# Patient Record
Sex: Male | Born: 1950 | Race: White | Hispanic: No | Marital: Single | Smoking: Never smoker
Health system: Southern US, Community
[De-identification: ages and names within clinical notes are randomized; demographics above are authoritative.]

## PROBLEM LIST (undated history)

## (undated) DIAGNOSIS — I10 Essential (primary) hypertension: Secondary | ICD-10-CM

## (undated) DIAGNOSIS — E559 Vitamin D deficiency, unspecified: Secondary | ICD-10-CM

## (undated) DIAGNOSIS — I712 Thoracic aortic aneurysm, without rupture, unspecified: Secondary | ICD-10-CM

## (undated) DIAGNOSIS — F0150 Vascular dementia without behavioral disturbance: Secondary | ICD-10-CM

## (undated) DIAGNOSIS — N289 Disorder of kidney and ureter, unspecified: Secondary | ICD-10-CM

## (undated) DIAGNOSIS — I519 Heart disease, unspecified: Secondary | ICD-10-CM

## (undated) DIAGNOSIS — R1310 Dysphagia, unspecified: Secondary | ICD-10-CM

## (undated) DIAGNOSIS — K8020 Calculus of gallbladder without cholecystitis without obstruction: Secondary | ICD-10-CM

## (undated) DIAGNOSIS — I509 Heart failure, unspecified: Secondary | ICD-10-CM

## (undated) DIAGNOSIS — F259 Schizoaffective disorder, unspecified: Secondary | ICD-10-CM

## (undated) DIAGNOSIS — I739 Peripheral vascular disease, unspecified: Secondary | ICD-10-CM

## (undated) DIAGNOSIS — M109 Gout, unspecified: Secondary | ICD-10-CM

## (undated) DIAGNOSIS — D649 Anemia, unspecified: Secondary | ICD-10-CM

## (undated) DIAGNOSIS — D6859 Other primary thrombophilia: Secondary | ICD-10-CM

## (undated) DIAGNOSIS — B356 Tinea cruris: Secondary | ICD-10-CM

## (undated) DIAGNOSIS — M810 Age-related osteoporosis without current pathological fracture: Secondary | ICD-10-CM

## (undated) DIAGNOSIS — K769 Liver disease, unspecified: Secondary | ICD-10-CM

## (undated) DIAGNOSIS — E663 Overweight: Secondary | ICD-10-CM

## (undated) DIAGNOSIS — G20 Parkinson's disease: Secondary | ICD-10-CM

## (undated) DIAGNOSIS — I4891 Unspecified atrial fibrillation: Secondary | ICD-10-CM

## (undated) DIAGNOSIS — R633 Feeding difficulties, unspecified: Secondary | ICD-10-CM

## (undated) DIAGNOSIS — Z9119 Patient's noncompliance with other medical treatment and regimen: Secondary | ICD-10-CM

## (undated) DIAGNOSIS — E785 Hyperlipidemia, unspecified: Secondary | ICD-10-CM

## (undated) DIAGNOSIS — R41841 Cognitive communication deficit: Secondary | ICD-10-CM

## (undated) DIAGNOSIS — G20A1 Parkinson's disease without dyskinesia, without mention of fluctuations: Secondary | ICD-10-CM

## (undated) DIAGNOSIS — E876 Hypokalemia: Secondary | ICD-10-CM

## (undated) HISTORY — DX: Calculus of gallbladder without cholecystitis without obstruction: K80.20

## (undated) HISTORY — PX: OTHER SURGICAL HISTORY: SHX169

---

## 1998-04-13 ENCOUNTER — Inpatient Hospital Stay (HOSPITAL_COMMUNITY): Admission: EM | Admit: 1998-04-13 | Discharge: 1998-04-18 | Payer: Self-pay | Admitting: Emergency Medicine

## 2001-11-09 ENCOUNTER — Emergency Department (HOSPITAL_COMMUNITY): Admission: EM | Admit: 2001-11-09 | Discharge: 2001-11-09 | Payer: Self-pay | Admitting: *Deleted

## 2004-01-07 ENCOUNTER — Emergency Department (HOSPITAL_COMMUNITY): Admission: EM | Admit: 2004-01-07 | Discharge: 2004-01-07 | Payer: Self-pay | Admitting: Emergency Medicine

## 2004-10-29 HISTORY — PX: COLONOSCOPY: SHX174

## 2004-11-17 ENCOUNTER — Encounter: Admission: RE | Admit: 2004-11-17 | Discharge: 2004-11-17 | Payer: Self-pay | Admitting: Internal Medicine

## 2004-11-27 ENCOUNTER — Encounter: Admission: RE | Admit: 2004-11-27 | Discharge: 2005-02-25 | Payer: Self-pay | Admitting: Internal Medicine

## 2005-01-30 ENCOUNTER — Ambulatory Visit (HOSPITAL_COMMUNITY): Admission: RE | Admit: 2005-01-30 | Discharge: 2005-01-30 | Payer: Self-pay | Admitting: Gastroenterology

## 2006-06-24 ENCOUNTER — Emergency Department (HOSPITAL_COMMUNITY): Admission: EM | Admit: 2006-06-24 | Discharge: 2006-06-24 | Payer: Self-pay | Admitting: Emergency Medicine

## 2010-09-15 ENCOUNTER — Emergency Department (HOSPITAL_COMMUNITY): Admission: EM | Admit: 2010-09-15 | Discharge: 2010-09-15 | Payer: Self-pay | Admitting: Emergency Medicine

## 2010-11-18 ENCOUNTER — Emergency Department (HOSPITAL_COMMUNITY)
Admission: EM | Admit: 2010-11-18 | Discharge: 2010-11-18 | Payer: Self-pay | Source: Home / Self Care | Admitting: Emergency Medicine

## 2011-01-09 LAB — RAPID URINE DRUG SCREEN, HOSP PERFORMED
Amphetamines: NOT DETECTED
Barbiturates: NOT DETECTED
Benzodiazepines: NOT DETECTED
Cocaine: NOT DETECTED
Opiates: NOT DETECTED
Tetrahydrocannabinol: NOT DETECTED

## 2011-01-09 LAB — CBC
Platelets: 216 10*3/uL (ref 150–400)
RBC: 5.19 MIL/uL (ref 4.22–5.81)
RDW: 13.5 % (ref 11.5–15.5)
WBC: 6.7 10*3/uL (ref 4.0–10.5)

## 2011-01-09 LAB — DIFFERENTIAL
Basophils Absolute: 0 10*3/uL (ref 0.0–0.1)
Lymphocytes Relative: 19 % (ref 12–46)
Neutro Abs: 4.8 10*3/uL (ref 1.7–7.7)
Neutrophils Relative %: 73 % (ref 43–77)

## 2011-01-09 LAB — BASIC METABOLIC PANEL
BUN: 25 mg/dL — ABNORMAL HIGH (ref 6–23)
Creatinine, Ser: 2.06 mg/dL — ABNORMAL HIGH (ref 0.4–1.5)
GFR calc Af Amer: 40 mL/min — ABNORMAL LOW (ref >60)
GFR calc non Af Amer: 33 mL/min — ABNORMAL LOW (ref >60)

## 2011-01-09 LAB — ETHANOL: Alcohol, Ethyl (B): <5 mg/dL (ref 0–10)

## 2011-03-16 NOTE — Op Note (Signed)
NAMELEIBY, PIGEON NO.:  [id number]   MEDICAL RECORD NO.:  [id number]          PATIENT TYPE:  AMB   LOCATION:  ENDO                         FACILITY:  [facility]   PHYSICIAN:  Danise Edge, M.D.   DATE OF BIRTH:  Jun 26, 1951   DATE OF PROCEDURE:  01/30/2005  DATE OF DISCHARGE:                                 OPERATIVE REPORT   PROCEDURE INDICATIONS:  Mr. Todd Page is a 60 year old male born December 05, 1950. Mr. Todd Page is scheduled to undergo his first screening colonoscopy with  polypectomy to prevent colon cancer.   ENDOSCOPIST:  Danise Edge, M.D.   PREMEDICATION:  Versed 7 mg, Demerol 70 mg.   PROCEDURE:  After obtaining informed consent, Mr. Todd Page was placed in the  left lateral decubitus position. I administered intravenous Demerol and  intravenous Versed to achieve conscious sedation for the procedure. The  patient's blood pressure, oxygen saturation and cardiac rhythm were  monitored throughout the procedure and documented in the medical record.   Anal inspection and digital rectal exam were normal. The Olympus adjustable  pediatric colonoscope was introduced into the rectum and advanced to the  cecum as identified by a normal-appearing appendiceal orifice and ileocecal  valve. Colonic preparation for the exam today was excellent.   Rectum normal. Retroflexed view of the distal rectum normal.  Sigmoid colon and descending colon normal.  Splenic flexure normal.  Transverse colon normal.  Hepatic flexure normal.  Ascending colon normal.  Cecum and ileum and ileocecal valve normal.   ASSESSMENT:  Normal screening proctocolonoscopy to the cecum.      MJ/MEDQ  D:  01/30/2005  T:  01/30/2005  Job:  [id number]   cc:   Georgann Housekeeper, MD  [address]  [place]  [place] [address]  Fax: [phone]

## 2012-03-28 ENCOUNTER — Other Ambulatory Visit: Payer: Self-pay

## 2012-03-28 ENCOUNTER — Emergency Department (HOSPITAL_COMMUNITY)
Admission: EM | Admit: 2012-03-28 | Discharge: 2012-03-28 | Disposition: A | Discharge status: Home / Self Care | Payer: Medicare Other | Attending: Emergency Medicine | Admitting: Emergency Medicine

## 2012-03-28 ENCOUNTER — Encounter (HOSPITAL_COMMUNITY): Payer: Self-pay | Admitting: *Deleted

## 2012-03-28 ENCOUNTER — Emergency Department (HOSPITAL_COMMUNITY): Payer: Medicare Other

## 2012-03-28 DIAGNOSIS — F410 Panic disorder [episodic paroxysmal anxiety] without agoraphobia: Secondary | ICD-10-CM | POA: Insufficient documentation

## 2012-03-28 DIAGNOSIS — I4891 Unspecified atrial fibrillation: Secondary | ICD-10-CM | POA: Insufficient documentation

## 2012-03-28 DIAGNOSIS — I10 Essential (primary) hypertension: Secondary | ICD-10-CM | POA: Insufficient documentation

## 2012-03-28 DIAGNOSIS — E876 Hypokalemia: Secondary | ICD-10-CM | POA: Insufficient documentation

## 2012-03-28 HISTORY — DX: Essential (primary) hypertension: I10

## 2012-03-28 LAB — CBC
HCT: 48.7 % (ref 39.0–52.0)
Hemoglobin: 16.8 g/dL (ref 13.0–17.0)
RBC: 5.89 MIL/uL — ABNORMAL HIGH (ref 4.22–5.81)
RDW: 13.4 % (ref 11.5–15.5)
WBC: 7.7 10*3/uL (ref 4.0–10.5)

## 2012-03-28 LAB — DIFFERENTIAL
Basophils Absolute: 0 10*3/uL (ref 0.0–0.1)
Lymphocytes Relative: 22 % (ref 12–46)
Lymphs Abs: 1.7 10*3/uL (ref 0.7–4.0)
Monocytes Absolute: 0.6 10*3/uL (ref 0.1–1.0)
Neutro Abs: 5.2 10*3/uL (ref 1.7–7.7)

## 2012-03-28 LAB — BASIC METABOLIC PANEL
CO2: 26 mEq/L (ref 19–32)
Chloride: 103 mEq/L (ref 96–112)
Creatinine, Ser: 1.4 mg/dL — ABNORMAL HIGH (ref 0.50–1.35)

## 2012-03-28 MED ORDER — DILTIAZEM HCL 30 MG PO TABS: 30.0000 mg | ORAL_TABLET | Freq: Four times a day (QID) | ORAL | Status: DC

## 2012-03-28 MED ORDER — DILTIAZEM HCL 25 MG/5ML IV SOLN
10.0000 mg | Freq: Once | INTRAVENOUS | Status: AC
  Administered 2012-03-28: 10 mg via INTRAVENOUS
  Filled 2012-03-28: qty 5

## 2012-03-28 MED ORDER — ASPIRIN 81 MG PO CHEW: 81.0000 mg | CHEWABLE_TABLET | Freq: Every day | ORAL | Status: DC

## 2012-03-28 MED ORDER — POTASSIUM CHLORIDE CRYS ER 20 MEQ PO TBCR
40.0000 meq | EXTENDED_RELEASE_TABLET | Freq: Once | ORAL | Status: AC
  Administered 2012-03-28: 40 meq via ORAL
  Filled 2012-03-28: qty 2

## 2012-03-28 MED ORDER — SODIUM CHLORIDE 0.9 % IV BOLUS (SEPSIS)
250.0000 mL | Freq: Once | INTRAVENOUS | Status: AC
  Administered 2012-03-28: 1000 mL via INTRAVENOUS

## 2012-03-28 MED ORDER — SODIUM CHLORIDE 0.9 % IV SOLN: INTRAVENOUS | Status: DC

## 2012-03-28 NOTE — Discharge Instructions (Signed)
Atrial Fibrillation Your caregiver has diagnosed you with atrial fibrillation (AFib). The heart normally beats very regularly; AFib is a type of irregular heartbeat. The heart rate may be faster or slower than normal. This can prevent your heart from pumping as well as it should. AFib can be constant (chronic) or intermittent (paroxysmal). CAUSES  Atrial fibrillation may be caused by:  Heart disease, including heart attack, coronary artery disease, heart failure, diseases of the heart valves, and others.   Blood clot in the lungs (pulmonary embolism).   Pneumonia or other infections.   Chronic lung disease.   Thyroid disease.   Toxins. These include alcohol, some medications (such as decongestant medications or diet pills), and caffeine.  In some people, no cause for AFib can be found. This is referred to as Lone Atrial Fibrillation. SYMPTOMS   Palpitations or a fluttering in your chest.   A vague sense of chest discomfort.   Shortness of breath.   Sudden onset of lightheadedness or weakness.  Sometimes, the first sign of AFib can be a complication of the condition. This could be a stroke or heart failure. DIAGNOSIS  Your description of your condition may make your caregiver suspicious of atrial fibrillation. Your caregiver will examine your pulse to determine if fibrillation is present. An EKG (electrocardiogram) will confirm the diagnosis. Further testing may help determine what caused you to have atrial fibrillation. This may include chest x-ray, echocardiogram, blood tests, or CT scans. PREVENTION  If you have previously had atrial fibrillation, your caregiver may advise you to avoid substances known to cause the condition (such as stimulant medications, and possibly caffeine or alcohol). You may be advised to use medications to prevent recurrence. Proper treatment of any underlying condition is important to help prevent recurrence. PROGNOSIS  Atrial fibrillation does tend to  become a chronic condition over time. It can cause significant complications (see below). Atrial fibrillation is not usually immediately life-threatening, but it can shorten your life expectancy. This seems to be worse in women. If you have lone atrial fibrillation and are under 60 years old, the risk of complications is very low, and life expectancy is not shortened. RISKS AND COMPLICATIONS  Complications of atrial fibrillation can include stroke, chest pain, and heart failure. Your caregiver will recommend treatments for the atrial fibrillation, as well as for any underlying conditions, to help minimize risk of complications. TREATMENT  Treatment for AFib is divided into several categories:  Treatment of any underlying condition.   Converting you out of AFib into a regular (sinus) rhythm.   Controlling rapid heart rate.   Prevention of blood clots and stroke.  Medications and procedures are available to convert your atrial fibrillation to sinus rhythm. However, recent studies have shown that this may not offer you any advantage, and cardiac experts are continuing research and debate on this topic. More important is controlling your rapid heartbeat. The rapid heartbeat causes more symptoms, and places strain on your heart. Your caregiver will advise you on the use of medications that can control your heart rate. Atrial fibrillation is a strong stroke risk. You can lessen this risk by taking blood thinning medications such as Coumadin (warfarin), or sometimes aspirin. These medications need close monitoring by your caregiver. Over-medication can cause bleeding. Too little medication may not protect against stroke. HOME CARE INSTRUCTIONS   If your caregiver prescribed medicine to make your heartbeat more normally, take as directed.   If blood thinners were prescribed by your caregiver, take EXACTLY as directed.     Perform blood tests EXACTLY as directed.   Quit smoking. Smoking increases your  cardiac and lung (pulmonary) risks.   DO NOT drink alcohol.   DO NOT drink caffeinated drinks (e.g. coffee, soda, chocolate, and leaf teas). You may drink decaffeinated coffee, soda or tea.   If you are overweight, you should choose a reduced calorie diet to lose weight. Please see a registered dietitian if you need more information about healthy weight loss. DO NOT USE DIET PILLS as they may aggravate heart problems.   If you have other heart problems that are causing AFib, you may need to eat a low salt, fat, and cholesterol diet. Your caregiver will tell you if this is necessary.   Exercise every day to improve your physical fitness. Stay active unless advised otherwise.   If your caregiver has given you a follow-up appointment, it is very important to keep that appointment. Not keeping the appointment could result in heart failure or stroke. If there is any problem keeping the appointment, you must call back to this facility for assistance.  SEEK MEDICAL CARE IF:  You notice a change in the rate, rhythm or strength of your heartbeat.   You develop an infection or any other change in your overall health status.  SEEK IMMEDIATE MEDICAL CARE IF:   You develop chest pain, abdominal pain, sweating, weakness or feel sick to your stomach (nausea).   You develop shortness of breath.   You develop swollen feet and ankles.   You develop dizziness, numbness, or weakness of your face or limbs, or any change in vision or speech.  MAKE SURE YOU:   Understand these instructions.   Will watch your condition.   Will get help right away if you are not doing well or get worse.  Document Released: 10/15/2005 Document Revised: 10/04/2011 Document Reviewed: 05/19/2008 [facility] Patient Information 2012 [place], [place].  Important to followup with cardiology referral made give them a call for appointment. Start taking Cardizem for the atrial fibrillation and a baby aspirin a day. Return for any new  worse symptoms. Return for her heart rate going faster for chest pain or shortness of breath. Had discussed admission with the hospitalist team is that he did not meet imaging criteria.

## 2012-03-28 NOTE — ED Notes (Signed)
Continues to get out of bed and get tangled in cords
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
## 2012-03-28 NOTE — ED Notes (Signed)
Spoke with pt's brother, Loraine Leriche. Informed him that pt has been evaluated and was determined that he did not meet criteria for admission.  Pt to be discharged.  Loraine Leriche to come to department to provide transportation.

## 2012-06-02 ENCOUNTER — Emergency Department (HOSPITAL_COMMUNITY)
Admission: EM | Admit: 2012-06-02 | Discharge: 2012-06-03 | Disposition: A | Discharge status: Home / Self Care | Payer: Medicare Other | Attending: Emergency Medicine | Admitting: Emergency Medicine

## 2012-06-02 ENCOUNTER — Emergency Department (HOSPITAL_COMMUNITY): Payer: Medicare Other

## 2012-06-02 ENCOUNTER — Encounter (HOSPITAL_COMMUNITY): Payer: Self-pay | Admitting: *Deleted

## 2012-06-02 DIAGNOSIS — F603 Borderline personality disorder: Secondary | ICD-10-CM | POA: Insufficient documentation

## 2012-06-02 DIAGNOSIS — I10 Essential (primary) hypertension: Secondary | ICD-10-CM | POA: Insufficient documentation

## 2012-06-02 DIAGNOSIS — R062 Wheezing: Secondary | ICD-10-CM | POA: Insufficient documentation

## 2012-06-02 DIAGNOSIS — R064 Hyperventilation: Secondary | ICD-10-CM | POA: Insufficient documentation

## 2012-06-02 DIAGNOSIS — R4689 Other symptoms and signs involving appearance and behavior: Secondary | ICD-10-CM

## 2012-06-02 HISTORY — DX: Schizoaffective disorder, unspecified: F25.9

## 2012-06-02 LAB — CBC WITH DIFFERENTIAL/PLATELET
Eosinophils Relative: 1 % (ref 0–5)
HCT: 49.7 % (ref 39.0–52.0)
Lymphocytes Relative: 13 % (ref 12–46)
Lymphs Abs: 1.4 10*3/uL (ref 0.7–4.0)
MCV: 80.8 fL (ref 78.0–100.0)
Monocytes Absolute: 0.7 10*3/uL (ref 0.1–1.0)
RBC: 6.15 MIL/uL — ABNORMAL HIGH (ref 4.22–5.81)
RDW: 13.7 % (ref 11.5–15.5)
WBC: 11.2 10*3/uL — ABNORMAL HIGH (ref 4.0–10.5)

## 2012-06-02 LAB — RAPID URINE DRUG SCREEN, HOSP PERFORMED
Amphetamines: NOT DETECTED
Barbiturates: NOT DETECTED
Benzodiazepines: NOT DETECTED

## 2012-06-02 LAB — BASIC METABOLIC PANEL
BUN: 19 mg/dL (ref 6–23)
CO2: 24 mEq/L (ref 19–32)
Calcium: 9.7 mg/dL (ref 8.4–10.5)
Creatinine, Ser: 1.85 mg/dL — ABNORMAL HIGH (ref 0.50–1.35)
Glucose, Bld: 125 mg/dL — ABNORMAL HIGH (ref 70–99)

## 2012-06-02 LAB — URINE MICROSCOPIC-ADD ON

## 2012-06-02 LAB — URINALYSIS, ROUTINE W REFLEX MICROSCOPIC
Glucose, UA: NEGATIVE mg/dL
Ketones, ur: NEGATIVE mg/dL
Protein, ur: NEGATIVE mg/dL

## 2012-06-02 MED ORDER — SODIUM CHLORIDE 0.9 % IV SOLN
INTRAVENOUS | Status: DC
  Administered 2012-06-02: 22:00:00 via INTRAVENOUS

## 2012-06-02 MED ORDER — SODIUM CHLORIDE 0.9 % IV BOLUS (SEPSIS)
1000.0000 mL | Freq: Once | INTRAVENOUS | Status: AC
  Administered 2012-06-02: 1000 mL via INTRAVENOUS
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
## 2012-06-02 NOTE — ED Notes (Signed)
Tele psych called at this time all information faxed. at this time. Todd Page

## 2012-06-02 NOTE — ED Provider Notes (Signed)
History   This chart was scribed for Flint Melter, MD by Melba Coon. The patient was seen in room APOTF/OTF and the patient's care was started at 6:42PM.     CSN: [id number]  Arrival date & time 06/02/12  1708   First MD Initiated Contact with Patient 06/02/12 1809      Chief Complaint  Patient presents with  . Medical Clearance    (Consider location/radiation/quality/duration/timing/severity/associated sxs/prior treatment) The history is provided by the patient. No language interpreter was used.   TRAEVION POEHLER is a 61 y.o. 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 Sheriff showed up at his residence because his mother thought it was unnecessary to walk to her grandchildren, which caused her to feel upset and call the police. Police was worried that he would do something harmful. Pt states he asked for IDs, but initially the police didn't show their IDs, so pt resisted arrest; pt states he doesn't know why he was arrested or why they were called out to the residence. Police showed IDs after pt was restrained; after that, pt was presented to the ED. Pt has Hx of Schizoaffective disorder. Pt used to seek help for mental issues but currently does not see a therapist or a psychiatrist and is not taking psychiatric medications. Pt states that he has been upset and teary-eyed lately along with increased lethargy compared to baseline. No recent illnesses. No HA, fever, neck pain, sore throat, rash, back pain, CP, SOB, abd pain, n/v/d, dysuria, or extremity pain, edema, weakness, numbness, or tingling. No known allergies. No other pertinent medical symptoms.  No PCP  Past Medical History  Diagnosis Date  . Hypertension   . Schizoaffective disorder     Past Surgical History  Procedure Date  . Cardiac surgery     History reviewed. No pertinent family  history.  History  Substance Use Topics  . Smoking status: Unknown If Ever Smoked  . Smokeless tobacco: Not on file  . Alcohol Use: No      Review of Systems 10 Systems reviewed and all are negative for acute change except as noted in the HPI.   Allergies  Review of patient's allergies indicates no known allergies.  Home Medications   No current outpatient prescriptions on file.  BP 137/107  Pulse 103  Temp 97.4 F (36.3 C) (Oral)  Resp 20  Ht 5\' 11"  (1.803 m)  Wt 280 lb (127.007 kg)  BMI 39.05 kg/m2  SpO2 95%  Physical Exam  Nursing note and vitals reviewed. Constitutional: He is oriented to person, place, and time. He appears well-developed and well-nourished. No distress.       Obese  HENT:  Head: Normocephalic and atraumatic.  Right Ear: External ear normal.  Left Ear: External ear normal.  Eyes: EOM are normal.  Neck: Normal range of motion. No tracheal deviation present.  Cardiovascular: Normal rate, regular rhythm and normal heart sounds.   Pulmonary/Chest: Effort normal. No respiratory distress. He has wheezes (mild bilateral wheezing with good air movement).  Musculoskeletal: Normal range of motion.  Neurological: He is alert and oriented to person, place, and time.  Skin: Skin is warm and dry. No rash noted.  Psychiatric: He has a normal mood and affect. His behavior is normal.    ED Course  Procedures (including critical care time)  DIAGNOSTIC STUDIES: Oxygen Saturation is 98% on room air, normal by my  interpretation.    COORDINATION OF CARE:  6:40PM - PMHx reviewed; nurses will enter info into the system 6:50PM - CXR, blood w/u, UA, and drug screen will be ordered for the pt. 9:55PM - consulted with [facility] who advises that pt to speak with [facility].  00:20- he was seen by Dr. Reece Leader, from tele-psychiatry; who felt that he was stable for discharge with outpatient management, and rescinded his involuntary commit. Patient was reevaluated, and had no  further complaints. He was calm, and cooperative. He has been drinking water, here.  Labs Reviewed  CBC WITH DIFFERENTIAL - Abnormal; Notable for the following:    WBC 11.2 (*)     RBC 6.15 (*)     Neutrophils Relative 80 (*)     Neutro Abs 9.0 (*)     All other components within normal limits  BASIC METABOLIC PANEL - Abnormal; Notable for the following:    Potassium 3.4 (*)     Glucose, Bld 125 (*)     Creatinine, Ser 1.85 (*)     GFR calc non Af Amer 38 (*)     GFR calc Af Amer 44 (*)     All other components within normal limits  URINALYSIS, ROUTINE W REFLEX MICROSCOPIC - Abnormal; Notable for the following:    Color, Urine STRAW (*)     Hgb urine dipstick TRACE (*)     All other components within normal limits  URINE MICROSCOPIC-ADD ON - Abnormal; Notable for the following:    Bacteria, UA MANY (*)     All other components within normal limits  ETHANOL  URINE RAPID DRUG SCREEN (HOSP PERFORMED)   Dg Chest 2 View  06/02/2012  *RADIOLOGY REPORT*  Clinical Data: Medical clearance for involuntary movement. Hyperventilation.  Hypertension.  CHEST - 2 VIEW  Comparison: 03/28/2012  Findings: Cardiac silhouette is normal in size and configuration. No mediastinal or hilar mass or adenopathy.  Mild right middle lobe chronic subsegmental atelectasis or scarring.  Lungs are otherwise clear.  Right hemidiaphragm is mildly elevated but stable.  Bony thorax is intact.  IMPRESSION: No active disease of the chest and no change from the prior study.  Original Report Authenticated By: Domenic Moras, M.D.     1. Aggressive behavior       MDM  Nonspecific aggressive behavior, without active psychosis, significant depression, or apparent medical illness. Patient stable for discharge with outpatient management.  Plan: Home Medications- None; Home Treatments- rest; Recommended follow up- [facility] or the Psychiatrist of choice asap  I personally performed the services described in this  documentation, which was scribed in my presence. The recorded information has been reviewed and considered.          Flint Melter, MD 06/03/12 [phone]
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
## 2012-06-03 NOTE — ED Notes (Signed)
Went to room to inform pt that he would be going home and that he will need to follow up  With Dr Betti Cruz to get back on his meds.  Pt stared at me and then said "I don't need to be on any medications and you are a bad person"   Asked pt why he said that and he stated  "You'll find out"   Asked pt again why he said that and he just said "You will find out very soon"   Informed Dr Effie Shy of pt's bizarre statements and that pt was acting threatening.   Pt to be discharged to go home via [employer] Deputy.

## 2012-06-29 DIAGNOSIS — I4891 Unspecified atrial fibrillation: Principal | ICD-10-CM

## 2012-06-29 HISTORY — DX: Unspecified atrial fibrillation: I48.91

## 2012-07-26 ENCOUNTER — Emergency Department (HOSPITAL_COMMUNITY): Payer: Medicare Other

## 2012-07-26 ENCOUNTER — Inpatient Hospital Stay (HOSPITAL_COMMUNITY)
Admission: EM | Admit: 2012-07-26 | Discharge: 2012-07-29 | DRG: 309 | Disposition: A | Discharge status: Home / Self Care | Payer: Medicare Other | Attending: Internal Medicine | Admitting: Internal Medicine

## 2012-07-26 ENCOUNTER — Encounter (HOSPITAL_COMMUNITY): Payer: Self-pay | Admitting: Emergency Medicine

## 2012-07-26 DIAGNOSIS — E876 Hypokalemia: Secondary | ICD-10-CM | POA: Diagnosis not present

## 2012-07-26 DIAGNOSIS — Z9289 Personal history of other medical treatment: Secondary | ICD-10-CM

## 2012-07-26 DIAGNOSIS — R600 Localized edema: Secondary | ICD-10-CM | POA: Diagnosis present

## 2012-07-26 DIAGNOSIS — F411 Generalized anxiety disorder: Secondary | ICD-10-CM

## 2012-07-26 DIAGNOSIS — Z6841 Body Mass Index (BMI) 40.0 and over, adult: Secondary | ICD-10-CM

## 2012-07-26 DIAGNOSIS — N390 Urinary tract infection, site not specified: Secondary | ICD-10-CM | POA: Diagnosis present

## 2012-07-26 DIAGNOSIS — F3289 Other specified depressive episodes: Secondary | ICD-10-CM | POA: Diagnosis present

## 2012-07-26 DIAGNOSIS — K8020 Calculus of gallbladder without cholecystitis without obstruction: Secondary | ICD-10-CM

## 2012-07-26 DIAGNOSIS — M109 Gout, unspecified: Secondary | ICD-10-CM | POA: Diagnosis present

## 2012-07-26 DIAGNOSIS — E669 Obesity, unspecified: Secondary | ICD-10-CM | POA: Diagnosis present

## 2012-07-26 DIAGNOSIS — Z9119 Patient's noncompliance with other medical treatment and regimen: Secondary | ICD-10-CM

## 2012-07-26 DIAGNOSIS — G4733 Obstructive sleep apnea (adult) (pediatric): Secondary | ICD-10-CM | POA: Diagnosis present

## 2012-07-26 DIAGNOSIS — R609 Edema, unspecified: Secondary | ICD-10-CM

## 2012-07-26 DIAGNOSIS — F329 Major depressive disorder, single episode, unspecified: Secondary | ICD-10-CM | POA: Diagnosis present

## 2012-07-26 DIAGNOSIS — Z91199 Patient's noncompliance with other medical treatment and regimen due to unspecified reason: Secondary | ICD-10-CM

## 2012-07-26 DIAGNOSIS — F419 Anxiety disorder, unspecified: Secondary | ICD-10-CM

## 2012-07-26 DIAGNOSIS — I10 Essential (primary) hypertension: Secondary | ICD-10-CM | POA: Diagnosis present

## 2012-07-26 DIAGNOSIS — F32A Depression, unspecified: Secondary | ICD-10-CM | POA: Diagnosis present

## 2012-07-26 DIAGNOSIS — Z79899 Other long term (current) drug therapy: Secondary | ICD-10-CM

## 2012-07-26 DIAGNOSIS — M79671 Pain in right foot: Secondary | ICD-10-CM | POA: Diagnosis present

## 2012-07-26 DIAGNOSIS — I4891 Unspecified atrial fibrillation: Principal | ICD-10-CM | POA: Diagnosis present

## 2012-07-26 DIAGNOSIS — M79672 Pain in left foot: Secondary | ICD-10-CM | POA: Diagnosis present

## 2012-07-26 DIAGNOSIS — Z23 Encounter for immunization: Secondary | ICD-10-CM

## 2012-07-26 DIAGNOSIS — M79609 Pain in unspecified limb: Secondary | ICD-10-CM

## 2012-07-26 DIAGNOSIS — F259 Schizoaffective disorder, unspecified: Secondary | ICD-10-CM | POA: Diagnosis present

## 2012-07-26 HISTORY — DX: Overweight: E66.3

## 2012-07-26 HISTORY — DX: Unspecified atrial fibrillation: I48.91

## 2012-07-26 LAB — HEPATIC FUNCTION PANEL
AST: 21 U/L (ref 0–37)
Bilirubin, Direct: 0.2 mg/dL (ref 0.0–0.3)

## 2012-07-26 LAB — BASIC METABOLIC PANEL
CO2: 24 mEq/L (ref 19–32)
Calcium: 9.3 mg/dL (ref 8.4–10.5)
GFR calc Af Amer: 65 mL/min — ABNORMAL LOW (ref >90)
GFR calc non Af Amer: 56 mL/min — ABNORMAL LOW (ref >90)
Sodium: 138 mEq/L (ref 135–145)

## 2012-07-26 LAB — URINALYSIS, ROUTINE W REFLEX MICROSCOPIC
Glucose, UA: NEGATIVE mg/dL
Protein, ur: 30 mg/dL — AB
Specific Gravity, Urine: 1.025 (ref 1.005–1.030)

## 2012-07-26 LAB — CBC WITH DIFFERENTIAL/PLATELET
Basophils Absolute: 0 10*3/uL (ref 0.0–0.1)
Basophils Relative: 0 % (ref 0–1)
Eosinophils Relative: 1 % (ref 0–5)
Lymphocytes Relative: 13 % (ref 12–46)
MCV: 80.2 fL (ref 78.0–100.0)
Platelets: 216 10*3/uL (ref 150–400)
RDW: 13.6 % (ref 11.5–15.5)
WBC: 7.6 10*3/uL (ref 4.0–10.5)

## 2012-07-26 LAB — SEDIMENTATION RATE: Sed Rate: 15 mm/hr (ref 0–16)

## 2012-07-26 LAB — PHOSPHORUS: Phosphorus: 3.8 mg/dL (ref 2.3–4.6)

## 2012-07-26 LAB — PROTIME-INR
INR: 1.08 (ref 0.00–1.49)
Prothrombin Time: 13.9 seconds (ref 11.6–15.2)

## 2012-07-26 LAB — MAGNESIUM: Magnesium: 2 mg/dL (ref 1.5–2.5)

## 2012-07-26 LAB — URINE MICROSCOPIC-ADD ON

## 2012-07-26 LAB — PRO B NATRIURETIC PEPTIDE: Pro B Natriuretic peptide (BNP): 272.5 pg/mL — ABNORMAL HIGH (ref 0–125)

## 2012-07-26 LAB — MRSA PCR SCREENING

## 2012-07-26 LAB — TROPONIN I
Troponin I: <0.3 ng/mL (ref <0.30)
Troponin I: <0.3 ng/mL (ref <0.30)

## 2012-07-26 MED ORDER — MORPHINE SULFATE 2 MG/ML IJ SOLN: 2.0000 mg | INTRAMUSCULAR | Status: DC | PRN

## 2012-07-26 MED ORDER — ONDANSETRON HCL 4 MG PO TABS: 4.0000 mg | ORAL_TABLET | Freq: Four times a day (QID) | ORAL | Status: DC | PRN

## 2012-07-26 MED ORDER — DILTIAZEM HCL 25 MG/5ML IV SOLN
10.0000 mg | Freq: Once | INTRAVENOUS | Status: AC
  Administered 2012-07-26: 10 mg via INTRAVENOUS

## 2012-07-26 MED ORDER — DILTIAZEM HCL 25 MG/5ML IV SOLN
INTRAVENOUS | Status: AC
  Administered 2012-07-26: 10 mg via INTRAVENOUS
  Filled 2012-07-26: qty 5

## 2012-07-26 MED ORDER — ALPRAZOLAM 0.5 MG PO TABS
0.5000 mg | ORAL_TABLET | Freq: Three times a day (TID) | ORAL | Status: DC | PRN
  Administered 2012-07-28 (×2): 0.5 mg via ORAL
  Filled 2012-07-26 (×2): qty 1

## 2012-07-26 MED ORDER — POTASSIUM CHLORIDE IN NACL 20-0.9 MEQ/L-% IV SOLN
INTRAVENOUS | Status: DC
  Administered 2012-07-26 – 2012-07-27 (×3): via INTRAVENOUS

## 2012-07-26 MED ORDER — DOCUSATE SODIUM 100 MG PO CAPS
100.0000 mg | ORAL_CAPSULE | Freq: Two times a day (BID) | ORAL | Status: DC
  Administered 2012-07-26 – 2012-07-29 (×6): 100 mg via ORAL
  Filled 2012-07-26 (×10): qty 1

## 2012-07-26 MED ORDER — LORAZEPAM 2 MG/ML IJ SOLN
1.0000 mg | Freq: Once | INTRAMUSCULAR | Status: AC
  Administered 2012-07-26: 1 mg via INTRAVENOUS
  Filled 2012-07-26: qty 1

## 2012-07-26 MED ORDER — ACETAMINOPHEN 325 MG PO TABS: 650.0000 mg | ORAL_TABLET | Freq: Four times a day (QID) | ORAL | Status: DC | PRN

## 2012-07-26 MED ORDER — ALUM & MAG HYDROXIDE-SIMETH 200-200-20 MG/5ML PO SUSP: 30.0000 mL | Freq: Four times a day (QID) | ORAL | Status: DC | PRN

## 2012-07-26 MED ORDER — HALOPERIDOL 2 MG PO TABS
2.0000 mg | ORAL_TABLET | Freq: Four times a day (QID) | ORAL | Status: DC | PRN
  Filled 2012-07-26: qty 1

## 2012-07-26 MED ORDER — ONDANSETRON HCL 4 MG/2ML IJ SOLN: 4.0000 mg | Freq: Four times a day (QID) | INTRAMUSCULAR | Status: DC | PRN

## 2012-07-26 MED ORDER — COLCHICINE 0.6 MG PO TABS
0.6000 mg | ORAL_TABLET | Freq: Two times a day (BID) | ORAL | Status: DC
  Administered 2012-07-26 – 2012-07-29 (×6): 0.6 mg via ORAL
  Filled 2012-07-26 (×6): qty 1

## 2012-07-26 MED ORDER — LISINOPRIL 10 MG PO TABS
10.0000 mg | ORAL_TABLET | Freq: Every day | ORAL | Status: DC
  Administered 2012-07-27 – 2012-07-29 (×3): 10 mg via ORAL
  Filled 2012-07-26 (×6): qty 1

## 2012-07-26 MED ORDER — ACETAMINOPHEN 650 MG RE SUPP: 650.0000 mg | Freq: Four times a day (QID) | RECTAL | Status: DC | PRN

## 2012-07-26 MED ORDER — ENOXAPARIN SODIUM 150 MG/ML ~~LOC~~ SOLN
SUBCUTANEOUS | Status: AC
  Filled 2012-07-26: qty 1

## 2012-07-26 MED ORDER — INFLUENZA VIRUS VACC SPLIT PF IM SUSP
0.2500 mL | INTRAMUSCULAR | Status: AC
  Administered 2012-07-27: 0.25 mL via INTRAMUSCULAR
  Filled 2012-07-26: qty 0.25

## 2012-07-26 MED ORDER — MORPHINE SULFATE 4 MG/ML IJ SOLN
4.0000 mg | Freq: Once | INTRAMUSCULAR | Status: AC
  Administered 2012-07-26: 4 mg via INTRAVENOUS
  Filled 2012-07-26: qty 1

## 2012-07-26 MED ORDER — LEVALBUTEROL HCL 0.63 MG/3ML IN NEBU: 0.6300 mg | INHALATION_SOLUTION | Freq: Four times a day (QID) | RESPIRATORY_TRACT | Status: DC | PRN

## 2012-07-26 MED ORDER — ENOXAPARIN SODIUM 150 MG/ML ~~LOC~~ SOLN
140.0000 mg | Freq: Two times a day (BID) | SUBCUTANEOUS | Status: DC
  Administered 2012-07-26 – 2012-07-29 (×6): 140 mg via SUBCUTANEOUS
  Filled 2012-07-26 (×8): qty 1

## 2012-07-26 MED ORDER — DILTIAZEM HCL 100 MG IV SOLR
5.0000 mg/h | INTRAVENOUS | Status: DC
  Administered 2012-07-26 – 2012-07-27 (×2): 10 mg/h via INTRAVENOUS
  Administered 2012-07-27: 5 mg/h via INTRAVENOUS
  Filled 2012-07-26: qty 100

## 2012-07-26 MED ORDER — ENOXAPARIN SODIUM 40 MG/0.4ML ~~LOC~~ SOLN: 1.0000 mg/kg | Freq: Once | SUBCUTANEOUS | Status: DC

## 2012-07-26 MED ORDER — DEXTROSE 5 % IV SOLN
10.0000 mg/h | Freq: Once | INTRAVENOUS | Status: AC
  Administered 2012-07-26: 10 mg/h via INTRAVENOUS

## 2012-07-26 MED ORDER — GUAIFENESIN-DM 100-10 MG/5ML PO SYRP: 5.0000 mL | ORAL_SOLUTION | ORAL | Status: DC | PRN

## 2012-07-26 MED ORDER — HALOPERIDOL LACTATE 5 MG/ML IJ SOLN: 2.0000 mg | Freq: Four times a day (QID) | INTRAMUSCULAR | Status: DC | PRN

## 2012-07-26 MED ORDER — POTASSIUM CHLORIDE CRYS ER 20 MEQ PO TBCR
20.0000 meq | EXTENDED_RELEASE_TABLET | Freq: Two times a day (BID) | ORAL | Status: DC
  Administered 2012-07-26: 20 meq via ORAL
  Filled 2012-07-26: qty 1

## 2012-07-26 MED ORDER — OXYCODONE HCL 5 MG PO TABS: 5.0000 mg | ORAL_TABLET | ORAL | Status: DC | PRN

## 2012-07-26 MED ORDER — GABAPENTIN 100 MG PO CAPS
200.0000 mg | ORAL_CAPSULE | Freq: Two times a day (BID) | ORAL | Status: DC
  Administered 2012-07-26: 200 mg via ORAL
  Filled 2012-07-26 (×5): qty 2

## 2012-07-26 NOTE — ED Provider Notes (Signed)
History   This chart was scribed for Tobin Chad, MD, by Frederik Pear. The patient was seen in room APA14/APA14 and the patient's care was started at 0834.    CSN: [id number]  Arrival date & time 07/26/12  7829   First MD Initiated Contact with Patient 07/26/12 [phone]      Chief Complaint  Patient presents with  . Leg Swelling  . Shortness of Breath    (Consider location/radiation/quality/duration/timing/severity/associated sxs/prior treatment) HPI Comments: Todd Page is a 61 y.o. male who presents to the Emergency Department complaining of moderate, gradually worsening swelling of the feet and lower legs that began 6 days ago. Pt reports that the pain started in his right foot and has since radiated to both feet to the point that he can no longer stand. Pt states that he has a h/o of gout, and initially self-diagnosed symptoms as gout. Pt reports associated intermittent SOB, but states that he was able to sleep flat on his back without the symptom worsening. Pt denies any associated fevers and pain anywhere other than the feet. 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 Luciana Axe. Pt reports no h/o heart disease, DM, or high BP; however, the pt has a h/o of depression. Pt was most recently hospitalized a few months ago for psychiatric evaluation. Pt denies any smoking or use of drugs and alcohol. Pt denies taking any medications regularly or any allergies to medications. 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    No PCP.         The history is provided by the patient. No language interpreter was used.    Past Medical History  Diagnosis Date  . Hypertension   . Schizoaffective disorder   . Depression     Past Surgical History  Procedure Date  . Cardiac surgery     History reviewed. No pertinent family history.  History  Substance Use Topics  . Smoking status: Never Smoker   . Smokeless tobacco: Not on file  . Alcohol  Use: No      Review of Systems  Constitutional: Negative for chills, activity change, appetite change and fatigue.  HENT: Negative.   Eyes: Negative.   Respiratory: Positive for shortness of breath. Negative for cough, chest tightness and wheezing.   Cardiovascular: Positive for leg swelling. Negative for chest pain and palpitations.  Gastrointestinal: Negative.   Genitourinary: Negative.   Musculoskeletal: Positive for joint swelling and arthralgias. Negative for myalgias, back pain and gait problem.       Difficulty walking secondary to foot swelling and pain  Skin: Negative for color change, pallor, rash and wound.  Neurological: Negative.   Hematological: Negative.   Psychiatric/Behavioral: Negative.   All other systems reviewed and are negative.    Allergies  Review of patient's allergies indicates no known allergies.  Home Medications  No current outpatient prescriptions on file.  BP 149/90  Pulse 102  Temp 97.9 F (36.6 C)  Resp 24  Ht 6\' 1"  (1.854 m)  Wt 300 lb (136.079 kg)  BMI 39.58 kg/m2  SpO2 100%  Physical Exam  Nursing note and vitals reviewed. Constitutional: He is oriented to person, place, and time. He appears well-developed and well-nourished. No distress. He is not intubated.  HENT:  Head: Normocephalic and atraumatic.  Right Ear: External ear normal.  Left Ear: External ear normal.  Nose: Nose normal.  Mouth/Throat: Oropharynx is clear and moist. No oropharyngeal exudate.  Eyes:  Conjunctivae normal and EOM are normal. Pupils are equal, round, and reactive to light. Right eye exhibits no discharge. Left eye exhibits no discharge. No scleral icterus.  Neck: Normal range of motion. Neck supple. No JVD present. No tracheal deviation present.  Cardiovascular: Normal rate, S1 normal, S2 normal, normal heart sounds, intact distal pulses and normal pulses.  An irregularly irregular rhythm present. PMI is not displaced.  Exam reveals no gallop and no  decreased pulses.   No murmur heard. Pulmonary/Chest: Breath sounds normal. Accessory muscle usage present. No stridor. Tachypnea noted. No apnea and not bradypneic. He is not intubated. No respiratory distress. He has no decreased breath sounds. He has no wheezes. He has no rhonchi. He has no rales.  Abdominal: Soft. Bowel sounds are normal. He exhibits no distension, no ascites, no pulsatile midline mass and no mass. There is no hepatosplenomegaly. There is no tenderness. There is no rebound and no CVA tenderness. No hernia.       Obese, protuberant  Musculoskeletal: Normal range of motion. He exhibits edema.  Lymphadenopathy:    He has no cervical adenopathy.  Neurological: He is alert and oriented to person, place, and time.  Skin: Skin is warm and dry.  Psychiatric: His behavior is normal. Thought content normal. His mood appears anxious. His affect is not angry, not blunt, not labile and not inappropriate. His speech is not rapid and/or pressured, not delayed, not tangential and not slurred. Thought content is not paranoid and not delusional. Cognition and memory are normal. Cognition and memory are not impaired. He does not exhibit a depressed mood. He expresses no homicidal and no suicidal ideation. He expresses no suicidal plans and no homicidal plans. He is communicative. He exhibits normal recent memory.    ED Course  Procedures (including critical care time)  DIAGNOSTIC STUDIES: Oxygen Saturation is 100% on room air, normal by my interpretation.    COORDINATION OF CARE:  08:55- Discussed planned course of treatment with the patient, including blood work, who is agreeable at this time.  09:30- Medication Orders: Lorazepam (ATIVAN) injection 1 mg- Once.   Results for orders placed during the hospital encounter of 07/26/12  CBC WITH DIFFERENTIAL      Component Value Range   WBC 7.6  4.0 - 10.5 K/uL   RBC 5.67  4.22 - 5.81 MIL/uL   Hemoglobin 15.4  13.0 - 17.0 g/dL   HCT 16.1   09.6 - 04.5 %   MCV 80.2  78.0 - 100.0 fL   MCH 27.2  26.0 - 34.0 pg   MCHC 33.8  30.0 - 36.0 g/dL   RDW 40.9  81.1 - 91.4 %   Platelets 216  150 - 400 K/uL   Neutrophils Relative 75  43 - 77 %   Neutro Abs 5.7  1.7 - 7.7 K/uL   Lymphocytes Relative 13  12 - 46 %   Lymphs Abs 1.0  0.7 - 4.0 K/uL   Monocytes Relative 11  3 - 12 %   Monocytes Absolute 0.8  0.1 - 1.0 K/uL   Eosinophils Relative 1  0 - 5 %   Eosinophils Absolute 0.1  0.0 - 0.7 K/uL   Basophils Relative 0  0 - 1 %   Basophils Absolute 0.0  0.0 - 0.1 K/uL  BASIC METABOLIC PANEL      Component Value Range   Sodium 138  135 - 145 mEq/L   Potassium 3.5  3.5 - 5.1 mEq/L   Chloride 101  96 - 112 mEq/L   CO2 24  19 - 32 mEq/L   Glucose, Bld 113 (*) 70 - 99 mg/dL   BUN 21  6 - 23 mg/dL   Creatinine, Ser 4.09  0.50 - 1.35 mg/dL   Calcium 9.3  8.4 - 81.1 mg/dL   GFR calc non Af Amer 56 (*) >90 mL/min   GFR calc Af Amer 65 (*) >90 mL/min  PRO B NATRIURETIC PEPTIDE      Component Value Range   Pro B Natriuretic peptide (BNP) 272.5 (*) 0 - 125 pg/mL  TROPONIN I      Component Value Range   Troponin I <0.30  <0.30 ng/mL    Labs Reviewed  BASIC METABOLIC PANEL - Abnormal; Notable for the following:    Glucose, Bld 113 (*)     GFR calc non Af Amer 56 (*)     GFR calc Af Amer 65 (*)     All other components within normal limits  PRO B NATRIURETIC PEPTIDE - Abnormal; Notable for the following:    Pro B Natriuretic peptide (BNP) 272.5 (*)     All other components within normal limits  CBC WITH DIFFERENTIAL  TROPONIN I   Dg Chest Portable 1 View  07/26/2012  *RADIOLOGY REPORT*  Clinical Data: Shortness of breath, leg swelling  PORTABLE CHEST - 1 VIEW  Comparison: 06/02/2012  Findings: Low lung volumes with mild bibasilar atelectasis.  No pleural effusion or pneumothorax.  The heart is top normal in size for inspiration.  IMPRESSION: No evidence of acute cardiopulmonary disease.   Original Report Authenticated By: Charline Bills, M.D.      No diagnosis found.   Date: 07/26/2012  Rate: 101 bpm  Rhythm: atrial fibrillation  QRS Axis: normal  Intervals: normal, (excluding absent PR int)  ST/T Wave abnormalities: nonspecific ST changes  Conduction Disutrbances:none  Narrative Interpretation:   Old EKG Reviewed: none available      MDM  Pt appears anxious, NAD.  Feet are tender to palp and there is edema that extends midway to his knees.  Note Afib on EKG with borderline tachycardia (RVR).  Basic labs, lower extremity dopplers, pBNP, and CXR ordered.  1650.  Pt has had several episodes of an increased respiratory rate and what he describes as anxiety.  He denies chest pain or palpitations during these events.  Trop is neg x2.  PBNP is not significantly elevated and his CXR does not demonstrate a PTX, infiltrate, edema, effusion, or significant cardiomegaly.  U/S of legs is negative for DVT.  Pt remains steadfast that the leg pain does not feel like his previous episodes of gout.  Morphine administered without any improvement in his pain.  He is unable to ambulate even with assistance.  Pt has a hx of psychiatric illness and has a bizarre child-like affect.  I was unsure if his symptoms were secondary to malingering, seeking secondary gain, severe anxiety, or another mental health issue that was just being described as a physical complaint.  He does however have atrial fibrillation and chronically ill.  Consults placed both with the hospitalist service (Dr. Sherrie Mustache), and with psychiatry.    Discussed his evaluation with Dr. Sherrie Mustache.  During her exam she noticed tachypnea associated with an increase in his HR to 140+.  Plan admit for rate control, pain mgmnt, and further evaluation.  Psychiatric consult and recommendations are still pending.  We will allow this consult to be finished secondary to his past mental health  issues and anxiety on exam.    I personally performed the services described in this  documentation, which was scribed in my presence. The recorded information has been reviewed and considered.        Tobin Chad, MD 07/26/12 [phone]

## 2012-07-26 NOTE — Progress Notes (Signed)
Pt placed on 2-3 lpm/[place] as he is pursed lip breathing 32-35 times a minute while lying. Chest- xray compared to 8/52013 and 03/28/2012 shows possible enlargement when looking at actual xray not interpitation.  Thb 15.4, down from  high 17 from 06/02/2012 suspect extra fluid interstial. Feet edematous,  Kidney function gfr 56 better than 06/02/12 ,BNP 272.5. Slight high. Pt does not retain  CO2 24 chemistry.

## 2012-07-26 NOTE — Progress Notes (Signed)
ANTICOAGULATION CONSULT NOTE - Initial Consult  Pharmacy Consult for Lovenox Indication: atrial fibrillation  No Known Allergies  Patient Measurements: Height: 6\' 1"  (185.4 cm) Weight: 306 lb 3.5 oz (138.9 kg) IBW/kg (Calculated) : 79.9   Vital Signs: Temp: 97.6 F (36.4 C) (09/28 1837) Temp src: Oral (09/28 1837) BP: 196/102 mmHg (09/28 1837) Pulse Rate: 110  (09/28 1837)  Labs:  Basename 07/26/12 1757 07/26/12 1354 07/26/12 0918 07/26/12 0827  HGB -- -- -- 15.4  HCT -- -- -- 45.5  PLT -- -- -- 216  APTT -- 27 -- --  LABPROT -- 13.9 -- --  INR -- 1.08 -- --  HEPARINUNFRC -- -- -- --  CREATININE -- -- -- 1.34  CKTOTAL 341* -- -- --  CKMB 4.0 -- -- --  TROPONINI <0.30 <0.30 <0.30 --    Estimated Creatinine Clearance: 85.8 ml/min (by C-G formula based on Cr of 1.34).   Medical History: Past Medical History  Diagnosis Date  . Hypertension   . Schizoaffective disorder   . Depression   . Morbid obesity     Medications:  Scheduled:    . colchicine  0.6 mg Oral BID  . diltiazem (CARDIZEM) infusion  10 mg/hr Intravenous Once  . diltiazem  10 mg Intravenous Once  . docusate sodium  100 mg Oral BID  . enoxaparin  1 mg/kg Subcutaneous Once  . gabapentin  200 mg Oral BID  . influenza  inactive virus vaccine  0.25 mL Intramuscular Tomorrow-1000  . lisinopril  10 mg Oral Daily  . LORazepam  1 mg Intravenous Once  . morphine  4 mg Intravenous Once  . potassium chloride  20 mEq Oral BID    Assessment: 61 yo obese M with new onset Afib.  CHADS2=1. Normal renal function. No hx of bleeding noted.   Goal of Therapy:  Anti-Xa level 0.6-1.2 units/ml 4hrs after LMWH dose given Monitor platelets by anticoagulation protocol: Yes   Plan:  Lovenox 140mg  sq q12h CBC q72hrs F/U long-term anticoagulation plan  Elson Clan 07/26/2012,7:26 PM

## 2012-07-26 NOTE — H&P (Signed)
[name] History and Physical  Todd Page ZOX:[id number] DOB: 11-Oct-1951 DOA: 07/26/2012  Referring physician: Dr. Lorenso Courier PCP: No primary provider on file.   Chief Complaint: Bilateral foot pain and swelling; shortness of breath.  HPI: Todd Page is a 61 y.o. male with a history significant for depression, schizoaffective disorder, and hypertension, who presents to the emergency department today with a chief complaint of bilateral foot pain and swelling. The pain and swelling started approximately one week ago. The pain and swelling started on his right foot, but then radiated to his left foot as well. There has been no redness on his feet, but there has been some redness on his toes. He describes the pain as burning and throbbing. The pain is 10 over 10 in intensity when he tries to stand or walk. He has been unable to stand or walk because of the pain. He has had gout before. He is not sure if his symptoms are from gout. He denies any trauma. He denies swelling in his legs above his ankles. He denies associated fever or chills. He also complains of shortness of breath. At times he hyperventilates because he feels anxious.Marland Kitchen He denies pleurisy. He does have some shortness of breath when he lays flat. He has had chest palpitations but no chest pain. He denies any known history of an irregular heart rhythm but says he had heart surgery approximately 30 years ago.  In the emergency department, he is hypertensive with a blood pressure in the 160s to 170s systolically and 100s diastolically. His heart rate is ranging from 100-140 beats per minute. He is oxygenating 100% on room air. He is afebrile. His lab data are significant for a normal troponin I, unremarkable electrolytes, normal WBC, and modestly elevated d-dimer. Lower extremity venous ultrasound revealed no DVT bilaterally. His chest x-ray reveals no acute cardiopulmonary disease. He is being admitted for further evaluation and  management.   Of note, tele-psychiatry has been consulted for the patient's unusual behavior in the emergency department. He has had anxiousness and occasionally yells out in pain, but when the registered nurse walks in the room, he denies pain. He denies visual or auditory hallucinations, but says that he used to hear voices "long time ago". 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   Review of Systems:   Past Medical History  Diagnosis Date  . Hypertension   . Schizoaffective disorder   . Depression   . Morbid obesity    Past Surgical History  Procedure Date  . Cardiac surgery    Social History: The patient is single. He lives in [place]. He has no children. He says that he is retired from the [employer]. He receives disability. He says that he receives disability from his heart surgery. He denies tobacco, alcohol, and illicit drug use.     No Known Allergies  Family history: His mother is 3 years of age with no significant medical history. His father died of cirrhosis at 61 years old.  Prior to Admission medications   Not on File   Physical Exam: Filed Vitals:   07/26/12 1500 07/26/12 1515 07/26/12 1600 07/26/12 1700  BP: 133/75 159/112 125/109 147/89  Pulse:      Temp:      Resp: 29  32 29  Height:      Weight:      SpO2:  General: obese anxious-appearing, disheveled appearing Caucasian man sitting up in bed.   Eyes: Pupils are equal, round, and reactive to light. Extraocular was are intact. Conjunctivae are clear. Sclerae are white.  ENT: Oropharynx reveals fair dentition. Mucosa membranes are mildly dry. No posterior exudates or erythema.   Neck: Obese, supple, no JVD, no thyromegaly.   Cardiovascular: Irregular, irregular, with tachycardia. Chest wall reveals a small indentation in the substernal area, but no sternotomy scar.   Respiratory: Breathing is  occasionally rapid as if he is hyperventilating, and at other times he has no tachypnea. Lungs clear to auscultation bilaterally.   Abdomen: Morbidly obese, positive bowel sounds, soft, nontender, nondistended.   Skin: Fair turgor. Multiple pimple-like excoriations on his legs and perineum. He has blue discoloration on the plantar surfaces of both feet (which he says is from icy hot).   Musculoskeletal: His feet are edematous at approximately 1-2+ nonpitting. There is mild erythema over the toes on both feet, with more erythema on his left great toe. His feet are mildly to moderately tender upon palpation. No active drainage. Pedal pulses barely palpable bilaterally. There is no pretibial edema or thigh edema.   Psychiatric: He is mildly anxious. He is alert and oriented x3. He denies visual or auditory hallucinations, but says that he has had them in the past. He hacknowledges depression but denies suicidal ideation. He denies any history of schizoaffective disorder or schizophrenia. He stopped taking his medicine for depression because he felt that he did not need it. 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   Neurologic: Cranial nerves II through XII are intact. He is unable to stand on his feet due to pain. Otherwise strength is 5 over 5. Sensation is grossly intact.   Labs on Admission:  Basic Metabolic Panel:  Lab 07/26/12 1610  NA 138  K 3.5  CL 101  CO2 24  GLUCOSE 113*  BUN 21  CREATININE 1.34  CALCIUM 9.3  MG --  PHOS --   Liver Function Tests: No results found for this basename: AST:5,ALT:5,ALKPHOS:5,BILITOT:5,PROT:5,ALBUMIN:5 in the last 168 hours No results found for this basename: LIPASE:5,AMYLASE:5 in the last 168 hours No results found for this basename: AMMONIA:5 in the last 168 hours CBC:  Lab 07/26/12 0827  WBC 7.6  NEUTROABS 5.7  HGB 15.4  HCT 45.5  MCV 80.2  PLT 216   Cardiac Enzymes:  Lab  07/26/12 1354 07/26/12 0918  CKTOTAL -- --  CKMB -- --  CKMBINDEX -- --  TROPONINI <0.30 <0.30    BNP (last 3 results)  Basename 07/26/12 0827  PROBNP 272.5*   CBG: No results found for this basename: GLUCAP:5 in the last 168 hours  Radiological Exams on Admission: US Venous Img Lower Bilateral  07/26/2012  *RADIOLOGY REPORT*  Clinical Data: Bilateral leg swelling.  BILATERAL LOWER EXTREMITY VENOUS DUPLEX ULTRASOUND  Technique:  Gray-scale sonography with graded compression, as well as color Doppler and duplex ultrasound, were performed to evaluate the deep venous system of both lower extremities from the level of the common femoral vein through the popliteal and proximal calf veins.  Spectral Doppler was utilized to evaluate flow at rest and with distal augmentation maneuvers.  Comparison:  None.  Findings:  Normal compressibility of bilateral common femoral, superficial femoral, and popliteal veins is demonstrated, as well as the visualized proximal calf veins.  No filling defects to suggest DVT on grayscale or color Doppler imaging.  Doppler waveforms show normal direction of venous flow, normal respiratory phasicity and response to augmentation.  IMPRESSION: No evidence of deep vein thrombosis in either lower extremity.   Original Report Authenticated By: Florencia Reasons, M.D.    Dg Chest Portable 1 View  07/26/2012  *RADIOLOGY REPORT*  Clinical Data: Shortness of breath, leg swelling  PORTABLE CHEST - 1 VIEW  Comparison: 06/02/2012  Findings: Low lung volumes with mild bibasilar atelectasis.  No pleural effusion or pneumothorax.  The heart is top normal in size for inspiration.  IMPRESSION: No evidence of acute cardiopulmonary disease.   Original Report Authenticated By: Charline Bills, M.D.     EKG: atrial fibrillation with a heart rate of 101 beats per minute and nonspecific ST and T wave abnormalities.   Assessment/Plan Principal Problem:  *Bilateral foot pain Active  Problems:  Atrial fibrillation with RVR  Gout flare  Pedal edema  HTN (hypertension), malignant  Morbid obesity with BMI of 50.0-59.9, adult  Anxiousness  Schizoaffective disorder  Depression     1. Atrial fibrillation with rapid ventricular response. This is likely a relatively new-onset, but it really is unknown. In looking back on the electronic medical record, apparently, the patient had atrial fibrillation during an evaluation in the emergency department in May 2013. He was repleted with potassium chloride and discharged home.  2. Reported history of cardiac surgery. The patient does not have an old sternotomy scar. There is a small indentation in the lower part of his substernal area.  3. Malignant hypertension. He has a history of hypertension, but has been lost to follow up or noncompliant.  4. Bilateral foot pain/swelling/and mild erythema. His symptomatology appears to be more consistent with gout or an acute arthropathy. His legs above his ankles and proximal legs have no edema. Though his d-dimer is modestly elevated, the lower extremity venous ultrasound is negative for DVT.  5. History of depression and schizoaffective disorder. The patient has had one ED visit for a panic attack and another for allergic  aggressive behavior. He was evaluated by the tele-psychiatrist at that time. Following the evaluation, he was discharged home. The patient appears anxious, but is not aggressive currently. 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 He may have either schizophrenia or schizoaffective disorder. Will await the psychiatrist evaluation.      Plan: 1. We'll give the patient a bolus of IV Cardizem followed by a Cardizem drip. 2. We'll start full dose Lovenox in light malignant hypertension and questionable cardiac surgery. 3. We'll start lisinopril for additional blood pressure management. 4. We'll start twice a day colchicine and twice a day  dosing of gabapentin for foot pain. Would prefer the use steroids, but given his psychiatric history, we'll hold off on its use for now. We'll add when necessary opiate analgesics. 6. We'll order when necessary alprazolam and when necessary Haldol. 7. Will start gentle IV fluids. 8. For further evaluation, we'll check additional cardiac enzymes, 2-D echocardiogram, TSH, and magnesium level. We'll check his PT and PTT. We'll check his magnesium and potassium levels. 9. We'll check a total CK-MB and sedimentation rate. 10. We'll await the assessment and recommendations by the tele-psychiatrist. 11. Will consult cardiology. 12. Consult the social worker to assess the patient's living environment.  Code Status: full code. Family Communication: no family available. Disposition Plan: unknown at this time.  Time spent: 1 hour and 20 minutes.  Yanelis Osika [name] Pager   If 7PM-7AM, please contact  night-coverage [email or web address] Password [facility] 07/26/2012, 5:55 PM

## 2012-07-26 NOTE — ED Notes (Signed)
Pt suddenly started moaning loudly.  In with pt and pt states he just started having cramps to rlq area. Went away while in room. Pt states they are gone now. I walked out of room and pt immediately starting hollaring/moaning again. This happened x4. EDP aware. Slight anxiety noted at this time.

## 2012-07-26 NOTE — ED Notes (Signed)
Report called to RN on ICU

## 2012-07-26 NOTE — ED Notes (Signed)
EKG obtained. A fib noted,taken to Dr. Lorenso Courier

## 2012-07-26 NOTE — ED Notes (Signed)
Slight anxiety noted with hyperventilation. Advised of correct breathing. Pt attempting

## 2012-07-26 NOTE — ED Notes (Signed)
Patient incontinent of urine.  Cleaned, changed bottom sheet and put in gown.

## 2012-07-26 NOTE — ED Notes (Signed)
Pt c/o feet and lower leg swelling x 6 days ago and has been getting worse. Swelling noted from lower leg down to toes bilateral with one plus pitting edema. Pt c/o getting sob intermittant this week. Pt has slight sob at this time. Denies cp/dizziness. C/o pain to feet. Alert/oriented. Color wnl.

## 2012-07-26 NOTE — ED Notes (Signed)
Attempted to stand patient w/assist x 2.  Patient will not put any weight on his feet.  Movements are quite uncoordinated.  Attempted standing 3 separate times w/out success.  Dr. Lorenso Courier notified.

## 2012-07-27 ENCOUNTER — Inpatient Hospital Stay (HOSPITAL_COMMUNITY): Payer: Medicare Other

## 2012-07-27 DIAGNOSIS — E876 Hypokalemia: Secondary | ICD-10-CM

## 2012-07-27 LAB — URIC ACID: Uric Acid, Serum: 10.4 mg/dL — ABNORMAL HIGH (ref 4.0–7.8)

## 2012-07-27 LAB — COMPREHENSIVE METABOLIC PANEL
ALT: 26 U/L (ref 0–53)
Alkaline Phosphatase: 57 U/L (ref 39–117)
CO2: 25 mEq/L (ref 19–32)
Chloride: 101 mEq/L (ref 96–112)
GFR calc Af Amer: 66 mL/min — ABNORMAL LOW (ref >90)
GFR calc non Af Amer: 57 mL/min — ABNORMAL LOW (ref >90)
Glucose, Bld: 93 mg/dL (ref 70–99)
Potassium: 3.4 mEq/L — ABNORMAL LOW (ref 3.5–5.1)
Sodium: 136 mEq/L (ref 135–145)
Total Bilirubin: 1.1 mg/dL (ref 0.3–1.2)

## 2012-07-27 LAB — CBC
MCHC: 33.1 g/dL (ref 30.0–36.0)
RDW: 13.8 % (ref 11.5–15.5)

## 2012-07-27 LAB — TROPONIN I: Troponin I: <0.3 ng/mL (ref <0.30)

## 2012-07-27 MED ORDER — POTASSIUM CHLORIDE CRYS ER 20 MEQ PO TBCR
40.0000 meq | EXTENDED_RELEASE_TABLET | Freq: Three times a day (TID) | ORAL | Status: DC
  Administered 2012-07-27 (×3): 40 meq via ORAL
  Filled 2012-07-27 (×3): qty 2

## 2012-07-27 MED ORDER — IOHEXOL 350 MG/ML SOLN
100.0000 mL | Freq: Once | INTRAVENOUS | Status: AC | PRN
  Administered 2012-07-27: 100 mL via INTRAVENOUS

## 2012-07-27 MED ORDER — INDOMETHACIN 25 MG PO CAPS
50.0000 mg | ORAL_CAPSULE | Freq: Two times a day (BID) | ORAL | Status: AC
  Administered 2012-07-27 (×2): 50 mg via ORAL
  Filled 2012-07-27 (×2): qty 2

## 2012-07-27 MED ORDER — SODIUM CHLORIDE 0.9 % IJ SOLN
INTRAMUSCULAR | Status: AC
  Filled 2012-07-27: qty 6

## 2012-07-27 MED ORDER — DILTIAZEM HCL 30 MG PO TABS
30.0000 mg | ORAL_TABLET | Freq: Four times a day (QID) | ORAL | Status: DC
  Administered 2012-07-27 – 2012-07-28 (×5): 30 mg via ORAL
  Filled 2012-07-27 (×5): qty 1

## 2012-07-27 MED ORDER — METOPROLOL TARTRATE 1 MG/ML IV SOLN: 5.0000 mg | Freq: Four times a day (QID) | INTRAVENOUS | Status: DC | PRN

## 2012-07-27 NOTE — Progress Notes (Addendum)
Pt asleep SATS 94-98 breathing 34-24 times a minute with pause between events . Pt had Oxygen off. Very restless. Pt most likely needs sleep study.

## 2012-07-27 NOTE — Progress Notes (Signed)
Chart reviewed. Discussed with RN.  Subjective: Gout pain a little better, but still unable to bear weight. Nurses report labored breathing, but patient denies shortness of breath, chest pain, wheezing.  Objective: Vital signs in last 24 hours: Filed Vitals:   07/27/12 0600 07/27/12 0615 07/27/12 0630 07/27/12 0740  BP: 146/92 153/106 157/115   Pulse: 90 85 88   Temp:    98 F (36.7 C)  TempSrc:    Axillary  Resp: 22 31 32   Height:      Weight:   134.1 kg (295 lb 10.2 oz)   SpO2: 99% 96% 93%    Weight change:   Intake/Output Summary (Last 24 hours) at 07/27/12 0753 Last data filed at 07/27/12 0600  Gross per 24 hour  Intake 1038.33 ml  Output    950 ml  Net  88.33 ml   Tele: Atrial fibrillation with a rate of about 80  General: Comfortable. Eating breakfast Lungs: Diminished throughout with occasional faint wheeze. No rhonchi or rales Cardiovascular irregularly irregular no murmurs gallops rubs Abdomen obese soft nontender Extremities feet erythematous and tender, edema. Psychiatric:  Normal affect. Calm and cooperative. Lab Results: Basic Metabolic Panel:  Lab 07/27/12 0981 07/26/12 1354 07/26/12 0827  NA 136 -- 138  K 3.4* -- 3.5  CL 101 -- 101  CO2 25 -- 24  GLUCOSE 93 -- 113*  BUN 19 -- 21  CREATININE 1.33 -- 1.34  CALCIUM 9.0 -- 9.3  MG -- 2.0 --  PHOS -- 3.8 --   Liver Function Tests:  Lab 07/27/12 0508 07/26/12 1354  AST 24 21  ALT 26 30  ALKPHOS 57 61  BILITOT 1.1 0.8  PROT 6.7 6.9  ALBUMIN 3.4* 3.5   No results found for this basename: LIPASE:2,AMYLASE:2 in the last 168 hours No results found for this basename: AMMONIA:2 in the last 168 hours CBC:  Lab 07/27/12 0508 07/26/12 0827  WBC 7.5 7.6  NEUTROABS -- 5.7  HGB 14.3 15.4  HCT 43.2 45.5  MCV 82.6 80.2  PLT 217 216   Cardiac Enzymes:  Lab 07/27/12 0508 07/26/12 2208 07/26/12 1757  CKTOTAL -- -- 341*  CKMB -- -- 4.0  CKMBINDEX -- -- --  TROPONINI <0.30 <0.30 <0.30    BNP:  Lab 07/26/12 0827  PROBNP 272.5*   D-Dimer:  Lab 07/26/12 1617  DDIMER 0.85*   CBG: No results found for this basename: GLUCAP:6 in the last 168 hours Hemoglobin A1C: No results found for this basename: HGBA1C in the last 168 hours Fasting Lipid Panel: No results found for this basename: CHOL,HDL,LDLCALC,TRIG,CHOLHDL,LDLDIRECT in the last 191 hours Thyroid Function Tests: No results found for this basename: TSH,T4TOTAL,FREET4,T3FREE,THYROIDAB in the last 168 hours Coagulation:  Lab 07/26/12 1354  LABPROT 13.9  INR 1.08   Anemia Panel: No results found for this basename: VITAMINB12,FOLATE,FERRITIN,TIBC,IRON,RETICCTPCT in the last 168 hours Urine Drug Screen: Drugs of Abuse     Component Value Date/Time   LABOPIA NONE DETECTED 06/02/2012 2132   COCAINSCRNUR NONE DETECTED 06/02/2012 2132   LABBENZ NONE DETECTED 06/02/2012 2132   AMPHETMU NONE DETECTED 06/02/2012 2132   THCU NONE DETECTED 06/02/2012 2132   LABBARB NONE DETECTED 06/02/2012 2132    Alcohol Level: No results found for this basename: ETH:2 in the last 168 hours Urinalysis:  Lab 07/26/12 2032  COLORURINE YELLOW  LABSPEC 1.025  PHURINE 6.0  GLUCOSEU NEGATIVE  HGBUR SMALL*  BILIRUBINUR NEGATIVE  KETONESUR NEGATIVE  PROTEINUR 30*  UROBILINOGEN 0.2  NITRITE POSITIVE*  LEUKOCYTESUR TRACE*   Micro Results: Recent Results (from the past 240 hour(s))  MRSA PCR SCREENING     Status: Abnormal   Collection Time   07/26/12  7:45 PM      Component Value Range Status Comment   MRSA by PCR METHICILLIN RESISTANT STAPHYLOCOCCUS AUREUS (*) NEGATIVE Final    Studies/Results: US Venous Img Lower Bilateral  07/26/2012  *RADIOLOGY REPORT*  Clinical Data: Bilateral leg swelling.  BILATERAL LOWER EXTREMITY VENOUS DUPLEX ULTRASOUND  Technique:  Gray-scale sonography with graded compression, as well as color Doppler and duplex ultrasound, were performed to evaluate the deep venous system of both lower extremities from the  level of the common femoral vein through the popliteal and proximal calf veins.  Spectral Doppler was utilized to evaluate flow at rest and with distal augmentation maneuvers.  Comparison:  None.  Findings:  Normal compressibility of bilateral common femoral, superficial femoral, and popliteal veins is demonstrated, as well as the visualized proximal calf veins.  No filling defects to suggest DVT on grayscale or color Doppler imaging.  Doppler waveforms show normal direction of venous flow, normal respiratory phasicity and response to augmentation.  IMPRESSION: No evidence of deep vein thrombosis in either lower extremity.   Original Report Authenticated By: Florencia Reasons, M.D.    Dg Chest Portable 1 View  07/26/2012  *RADIOLOGY REPORT*  Clinical Data: Shortness of breath, leg swelling  PORTABLE CHEST - 1 VIEW  Comparison: 06/02/2012  Findings: Low lung volumes with mild bibasilar atelectasis.  No pleural effusion or pneumothorax.  The heart is top normal in size for inspiration.  IMPRESSION: No evidence of acute cardiopulmonary disease.   Original Report Authenticated By: Charline Bills, M.D.    Scheduled Meds:   . colchicine  0.6 mg Oral BID  . diltiazem (CARDIZEM) infusion  10 mg/hr Intravenous Once  . diltiazem  10 mg Intravenous Once  . docusate sodium  100 mg Oral BID  . enoxaparin (LOVENOX) injection  140 mg Subcutaneous Q12H  . gabapentin  200 mg Oral BID  . influenza  inactive virus vaccine  0.25 mL Intramuscular Tomorrow-1000  . lisinopril  10 mg Oral Daily  . LORazepam  1 mg Intravenous Once  . morphine  4 mg Intravenous Once  . potassium chloride  20 mEq Oral BID  . DISCONTD: enoxaparin  1 mg/kg Subcutaneous Once   Continuous Infusions:   . 0.9 % NaCl with KCl 20 mEq / L 70 mL/hr at 07/27/12 0600  . diltiazem (CARDIZEM) infusion 10 mg/hr (07/27/12 0600)   PRN Meds:.acetaminophen, acetaminophen, ALPRAZolam, alum & mag hydroxide-simeth, guaiFENesin-dextromethorphan,  haloperidol, haloperidol lactate, levalbuterol, morphine injection, ondansetron (ZOFRAN) IV, ondansetron, oxyCODONE Assessment/Plan: Principal Problem:  *Atrial fibrillation with RVR: Will transition to oral Cardizem. Continue full dose Lovenox. Await echocardiogram, TSH. D-dimer is elevated and nurses report labored breathing. Will check CT angiogram of the chest to rule out PE. Patient is not a good long-term anticoagulation candidate, as compliance is questionable. Could consider this as an outpatient if he makes regular followup visits. Need records from previous PCP, Dr. Georgann Housekeeper. Active Problems:  HTN (hypertension), malignant  Gout flare: We'll give 2 doses of indomethacin and check uric acid.  Pedal edema: DVT ruled out. Stop IV fluids.  Morbid obesity with BMI of 50.0-59.9, adult  Anxiousness  Schizoaffective disorder  Depression: Per telepsychiatry consult, no treatment indicated at this time. Patient is stable from a psychiatric standpoint. Hypokalemia: Replete by mouth.   LOS: 1 day   Christino Mcglinchey  L 07/27/2012, 7:53 AM

## 2012-07-28 ENCOUNTER — Encounter (HOSPITAL_COMMUNITY): Payer: Self-pay | Admitting: Cardiology

## 2012-07-28 DIAGNOSIS — I517 Cardiomegaly: Secondary | ICD-10-CM

## 2012-07-28 DIAGNOSIS — Z9289 Personal history of other medical treatment: Secondary | ICD-10-CM

## 2012-07-28 DIAGNOSIS — N390 Urinary tract infection, site not specified: Secondary | ICD-10-CM

## 2012-07-28 DIAGNOSIS — I10 Essential (primary) hypertension: Secondary | ICD-10-CM

## 2012-07-28 DIAGNOSIS — K8020 Calculus of gallbladder without cholecystitis without obstruction: Secondary | ICD-10-CM

## 2012-07-28 LAB — BASIC METABOLIC PANEL
BUN: 24 mg/dL — ABNORMAL HIGH (ref 6–23)
Chloride: 107 mEq/L (ref 96–112)
GFR calc Af Amer: 80 mL/min — ABNORMAL LOW (ref >90)
GFR calc non Af Amer: 69 mL/min — ABNORMAL LOW (ref >90)
Potassium: 4.6 mEq/L (ref 3.5–5.1)
Sodium: 137 mEq/L (ref 135–145)

## 2012-07-28 MED ORDER — NITROFURANTOIN MONOHYD MACRO 100 MG PO CAPS
100.0000 mg | ORAL_CAPSULE | Freq: Two times a day (BID) | ORAL | Status: DC
  Administered 2012-07-28 – 2012-07-29 (×3): 100 mg via ORAL
  Filled 2012-07-28 (×5): qty 1

## 2012-07-28 MED ORDER — INDOMETHACIN 25 MG PO CAPS
50.0000 mg | ORAL_CAPSULE | Freq: Once | ORAL | Status: AC
  Administered 2012-07-28: 25 mg via ORAL
  Filled 2012-07-28 (×2): qty 1

## 2012-07-28 MED ORDER — DILTIAZEM HCL ER COATED BEADS 180 MG PO CP24
180.0000 mg | ORAL_CAPSULE | Freq: Every day | ORAL | Status: DC
  Administered 2012-07-28 – 2012-07-29 (×2): 180 mg via ORAL
  Filled 2012-07-28 (×2): qty 1

## 2012-07-28 NOTE — Progress Notes (Signed)
*  PRELIMINARY RESULTS* Echocardiogram 2D Echocardiogram has been performed.  Todd Page 07/28/2012, 2:33 PM

## 2012-07-28 NOTE — Progress Notes (Signed)
Overnight, RN reports periods of apnea while asleep. Patient has been weaned off Cardizem drip.  Subjective: Gout pain resolved in the right foot. Improving in the left foot. No dyspnea or chest pain.  Denies dysuria, frequency, hesitancy. Denies flank pain fevers or chills.  Objective: Vital signs in last 24 hours: Filed Vitals:   07/28/12 0400 07/28/12 0500 07/28/12 0600 07/28/12 0636  BP: 135/94  137/93 151/101  Pulse: 76 85 78   Temp:  97.7 F (36.5 C)    TempSrc:      Resp: 19 21 26    Height:      Weight:  139.5 kg (307 lb 8.7 oz)    SpO2: 97% 99% 96%    Weight change: 3.421 kg (7 lb 8.7 oz)  Intake/Output Summary (Last 24 hours) at 07/28/12 0801 Last data filed at 07/28/12 0600  Gross per 24 hour  Intake 1943.42 ml  Output   1150 ml  Net 793.42 ml   Tele: Atrial fibrillation with a rate of about 80  General: Comfortable. Eating breakfast Lungs: Diminished throughout with occasional faint wheeze. No rhonchi or rales Cardiovascular irregularly irregular no murmurs gallops rubs Abdomen obese soft nontender Extremities: Right foot without erythema warmth or tenderness. Left foot still has slight erythema, minimal tenderness near the first MTP joint. Psychiatric:  Normal affect. Calm and cooperative.  Lab Results: Basic Metabolic Panel:  Lab 07/28/12 1610 07/27/12 0508 07/26/12 1354  NA 137 136 --  K 4.6 3.4* --  CL 107 101 --  CO2 22 25 --  GLUCOSE 108* 93 --  BUN 24* 19 --  CREATININE 1.13 1.33 --  CALCIUM 9.3 9.0 --  MG -- -- 2.0  PHOS -- -- 3.8   Liver Function Tests:  Lab 07/27/12 0508 07/26/12 1354  AST 24 21  ALT 26 30  ALKPHOS 57 61  BILITOT 1.1 0.8  PROT 6.7 6.9  ALBUMIN 3.4* 3.5   No results found for this basename: LIPASE:2,AMYLASE:2 in the last 168 hours No results found for this basename: AMMONIA:2 in the last 168 hours CBC:  Lab 07/27/12 0508 07/26/12 0827  WBC 7.5 7.6  NEUTROABS -- 5.7  HGB 14.3 15.4  HCT 43.2 45.5  MCV 82.6 80.2    PLT 217 216   Cardiac Enzymes:  Lab 07/27/12 0508 07/26/12 2208 07/26/12 1757  CKTOTAL -- -- 341*  CKMB -- -- 4.0  CKMBINDEX -- -- --  TROPONINI <0.30 <0.30 <0.30   BNP:  Lab 07/26/12 0827  PROBNP 272.5*   D-Dimer:  Lab 07/26/12 1617  DDIMER 0.85*   CBG: No results found for this basename: GLUCAP:6 in the last 168 hours Hemoglobin A1C:  Lab 07/26/12 1354  HGBA1C 6.1*   Fasting Lipid Panel: No results found for this basename: CHOL,HDL,LDLCALC,TRIG,CHOLHDL,LDLDIRECT in the last 960 hours Thyroid Function Tests:  Lab 07/26/12 1354  TSH 1.442  T4TOTAL --  FREET4 --  T3FREE --  THYROIDAB --   Coagulation:  Lab 07/26/12 1354  LABPROT 13.9  INR 1.08   Anemia Panel: No results found for this basename: VITAMINB12,FOLATE,FERRITIN,TIBC,IRON,RETICCTPCT in the last 168 hours Urine Drug Screen: Drugs of Abuse     Component Value Date/Time   LABOPIA NONE DETECTED 06/02/2012 2132   COCAINSCRNUR NONE DETECTED 06/02/2012 2132   LABBENZ NONE DETECTED 06/02/2012 2132   AMPHETMU NONE DETECTED 06/02/2012 2132   THCU NONE DETECTED 06/02/2012 2132   LABBARB NONE DETECTED 06/02/2012 2132    Alcohol Level: No results found for this basename: ETH:2  in the last 168 hours Urinalysis:  Lab 07/26/12 2032  COLORURINE YELLOW  LABSPEC 1.025  PHURINE 6.0  GLUCOSEU NEGATIVE  HGBUR SMALL*  BILIRUBINUR NEGATIVE  KETONESUR NEGATIVE  PROTEINUR 30*  UROBILINOGEN 0.2  NITRITE POSITIVE*  LEUKOCYTESUR TRACE*   Micro Results: Recent Results (from the past 240 hour(s))  MRSA PCR SCREENING     Status: Abnormal   Collection Time   07/26/12  7:45 PM      Component Value Range Status Comment   MRSA by PCR METHICILLIN RESISTANT STAPHYLOCOCCUS AUREUS (*) NEGATIVE Final    Studies/Results: Ct Angio Chest Pe W/cm &/or Wo Cm  07/27/2012  *RADIOLOGY REPORT*  Clinical Data: Short of breath, elevated D-dimer evaluate for PE  CT ANGIOGRAPHY CHEST  Technique:  Multidetector CT imaging of the chest using  the standard protocol during bolus administration of intravenous contrast. Multiplanar reconstructed images including MIPs were obtained and reviewed to evaluate the vascular anatomy.  Contrast: OMNIPAQUE IOHEXOL 350 MG/ML SOLN  Comparison: Chest x-ray obtained yesterday, 07/26/2012  Findings:  Mediastinum: Unremarkable thyroid gland and thoracic inlet. Scattered mediastinal lymph nodes are not enlarged by CT criteria. The largest node is a low right paratracheal node and it measures 9 mm in short axis.  The thoracic esophagus is unremarkable.  Mildly prominent mediastinal and epicardial fat.  Heart/Vascular: The pulmonary arteries are well opacified to the segmental level.  No definite central filling defect to the level of the segmental arteries.  Evaluation of the subsegmental arteries is limited, particularly in the lung bases were respiratory motion limits evaluation. Cardiomegaly with left atrial dilatation. Pulmonary arteries are normal in size.  Question punctate calcification in the circumflex coronary artery.  Conventional three-vessel arch anatomy.  No aneurysmal dilatation or dissection.  Lungs/Pleura: Mild dependent atelectasis bilaterally.  Respiratory motion throughout the lungs significantly limits evaluation for small pulmonary nodules.  Linear ground-glass attenuation in the inferior right upper lobe most consistent with atelectasis.  Small areas of relatively increased lucency in the bilateral upper lungs consistent with focal air trapping.  No definite pulmonary nodule identified.  No pneumothorax or pleural effusion.  Upper Abdomen: Radiopaque stones layers within the gallbladder lumen.  No significant distension or pericholecystic fluid or stranding in the visualized portions of the gallbladder.  Please note the gallbladder is incompletely imaged.  Otherwise, the visualized upper abdomen is unremarkable.  Bones: No acute fracture or aggressive appearing lytic or blastic osseous lesion.   Mild multilevel degenerative disc disease with anterior osteophyte formation in the lower thoracic spine.  IMPRESSION:  1.  No evidence of acute pulmonary embolus to the segmental level. Evaluation of the subsegmental pulmonary arteries significantly limited by respiratory motion.  2.  Mild dependent atelectasis, and inferior right upper lobe atelectasis. 3.  Areas of relatively increased lucency in the upper lobes suggest focal air trapping.  This may be related to small airways disease. 4.  Trace atherosclerosis including likely vascular calcification in the circumflex coronary artery 5.  Cardiomegaly with left atrial enlargement 6.  Cholelithiasis   Original Report Authenticated By: Vilma Prader    US Venous Img Lower Bilateral  07/26/2012  *RADIOLOGY REPORT*  Clinical Data: Bilateral leg swelling.  BILATERAL LOWER EXTREMITY VENOUS DUPLEX ULTRASOUND  Technique:  Gray-scale sonography with graded compression, as well as color Doppler and duplex ultrasound, were performed to evaluate the deep venous system of both lower extremities from the level of the common femoral vein through the popliteal and proximal calf veins.  Spectral Doppler was utilized  to evaluate flow at rest and with distal augmentation maneuvers.  Comparison:  None.  Findings:  Normal compressibility of bilateral common femoral, superficial femoral, and popliteal veins is demonstrated, as well as the visualized proximal calf veins.  No filling defects to suggest DVT on grayscale or color Doppler imaging.  Doppler waveforms show normal direction of venous flow, normal respiratory phasicity and response to augmentation.  IMPRESSION: No evidence of deep vein thrombosis in either lower extremity.   Original Report Authenticated By: Florencia Reasons, M.D.    Dg Chest Portable 1 View  07/26/2012  *RADIOLOGY REPORT*  Clinical Data: Shortness of breath, leg swelling  PORTABLE CHEST - 1 VIEW  Comparison: 06/02/2012  Findings: Low lung volumes with mild  bibasilar atelectasis.  No pleural effusion or pneumothorax.  The heart is top normal in size for inspiration.  IMPRESSION: No evidence of acute cardiopulmonary disease.   Original Report Authenticated By: Charline Bills, M.D.    Scheduled Meds:    . colchicine  0.6 mg Oral BID  . diltiazem  30 mg Oral Q6H  . docusate sodium  100 mg Oral BID  . enoxaparin (LOVENOX) injection  140 mg Subcutaneous Q12H  . indomethacin  50 mg Oral BID WC  . influenza  inactive virus vaccine  0.25 mL Intramuscular Tomorrow-1000  . lisinopril  10 mg Oral Daily  . potassium chloride  40 mEq Oral TID  . sodium chloride       Continuous Infusions:    . 0.9 % NaCl with KCl 20 mEq / L 10 mL/hr at 07/28/12 0600  . diltiazem (CARDIZEM) infusion Stopped (07/27/12 1208)   PRN Meds:.acetaminophen, ALPRAZolam, alum & mag hydroxide-simeth, guaiFENesin-dextromethorphan, haloperidol, haloperidol lactate, iohexol, levalbuterol, metoprolol, morphine injection, ondansetron (ZOFRAN) IV, ondansetron, oxyCODONE Assessment/Plan: Principal Problem:  *Atrial fibrillation with RVR, now rate controlled on oral Cardizem.  Await echocardiogram. Will transition to once daily long-acting diltiazem.  Await echocardiogram. TSH normal. CT angiogram of the chest shows no pulmonary embolus. Patient reports that he had been seeing Dr. Georgann Housekeeper regularly until last fall. Will get records. However, patient has stopped all of his medications, and may not be an ideal long-term anticoagulation candidate. Could consider this as an outpatient if he makes regular followup visits. Continue the therapeutic dose Lovenox for now.  Transfer to tele.  Cardiology eval pending. Active Problems:  HTN (hypertension), better controlled  Gout flare improving. Will give another dose of indomethacin. Uric acid level is high and patient would benefit from probenicid once acute phase resolved.  Morbid obesity with BMI of 50.0-59.9, adult  Schizoaffective  disorder  Depression: Per telepsychiatry consult, no treatment indicated at this time. Patient is stable from a psychiatric standpoint. Hypokalemia resolved UTI, asymptomatic.  Will treat with short course of antibiotics Probable OSA:  Outpatient polysomnogram   LOS: 2 days   Artis Beggs L 07/28/2012, 8:01 AM

## 2012-07-28 NOTE — Clinical Social Work Psychosocial (Signed)
    Clinical Social Work Department BRIEF PSYCHOSOCIAL ASSESSMENT 07/28/2012  Patient:  Todd Page, Todd Page     Account Number:  [id number]     Admit date:  07/26/2012  Clinical Social Worker:  Santa Genera, CLINICAL SOCIAL WORKER  Date/Time:  07/28/2012 11:00 AM  Referred by:  Physician  Date Referred:  07/28/2012 Referred for  Other - See comment   Other Referral:   Assessment of home living situation   Interview type:  Patient Other interview type:   Also spoke w mother, Delman Goshorn, who lives at home w patient.    PSYCHOSOCIAL DATA Living Status:  FAMILY Admitted from facility:   Level of care:   Primary support name:  [name] Nucci Primary support relationship to patient:  PARENT Degree of support available:   Limited due to mutual health concerns and limitation    CURRENT CONCERNS Current Concerns  Other - See comment   Other Concerns:   Home living situation    SOCIAL WORK ASSESSMENT / PLAN CSW met w patient at bedside, patient alert and oriented. Patient lives w 46 year old mother, mother also has health issues and says she "tries her best" to help care for patient, but is unable to offer much support due to her own health conditions.  Patient says that he is able to drive, ambulate independently, and grocery shops, runs errands and performs general household tasks for himself and his mother.  Says he and his mother prepare simple food, but are able to eat reasonably well.  Patient feels that he contributes to his mother's care, but has some concerns that if both of their conditions decline, they may not be able to remain independent.  Patient's brother lives nearby and can assist some.  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 Says he had heart surgery several years ago and is followed by Dr Rene Paci in [place].    Patient was seeing Dr Betti Cruz, psychiatrist, in [place] and being treated w antidepressants and another medication  that the patient cannot remember.  Patient has not been back in 7 - 8 months, normally goes to visits every 6 months for refills and medication management.  Would like referral to psychiatrist in [place] as this would be more accessible.  States he has a diagnosis of depression.  Has been hospitalized several times at [facility], most recent time was 10 years ago.    Patient was cooperativ and appropriate during interview. Is interested in referral for home health social worker to assess what additional resources might be available for him and and his mother to be able to maintain their independent living status.   Assessment/plan status:  Psychosocial Support/Ongoing Assessment of Needs Other assessment/ plan:   CSW will provide list of community resources, including Meals on Wheels for patient, and will ask RN CM to discuss home health social work referral.  CSW will provide patient w contact information for psychiatrist in [place] if patient chooses to change from his current provider in [place].   Information/referral to community resources:   Psychologist, sport and exercise information for [employer] psychiatrist    PATIENT'S/FAMILY'S RESPONSE TO PLAN OF CARE: Appreciative    Santa Genera, LCSW Clinical Social Worker [phone])

## 2012-07-28 NOTE — Consult Note (Signed)
Patient Name: Todd Page  MRN: [id number]  HPI: Todd Page is an 61 y.o. male referred for consultation by Dr.Corinna Burman Freestone, MD for atrial fibrillation.  The patient reports a history of cardiac surgery at [facility] 20 or 30 years ago, but is unaware of the diagnosis or the procedure.  Moreover, he did not have a significant thoracotomy scar.  Prior records are pending.  He presented to the hospital with gout, but was found to have atrial fibrillation with a rapid ventricular response.  He is unaware of any previous diagnosis of arrhythmia.  Past Medical History  Diagnosis Date  . Hypertension   . Schizoaffective disorder     With depression  . Atrial fibrillation 06/2012    first identified in 06/2012; normal TSH  . Overweight    Past Surgical History  Procedure Date  . Cardiac surgery   . Colonoscopy 2006    normal screening examination   History reviewed. No pertinent family history.  Social History:  reports that he has never smoked. He does not have any smokeless tobacco history on file. He reports that he does not drink alcohol or use illicit drugs. ` Allergies: No Known Allergies  Medications:  I have reviewed the patient's current medications. Scheduled:   . colchicine  0.6 mg Oral BID  . diltiazem  180 mg Oral Daily  . enoxaparin (LOVENOX) injection  140 mg Subcutaneous Q12H  . indomethacin  50 mg Oral BID WC  . indomethacin  50 mg Oral Once  . lisinopril  10 mg Oral Daily    Magnesium 2.0  1.5 - 2.5 mg/dL   TSH     Status: Normal   Collection Time   07/26/12  1:54 PM      Component Value Range Comment   TSH 1.442  0.350 - 4.500 uIU/mL    Hemoglobin A1C 6.1 (*) <5.7 %    Mean Plasma Glucose 128 (*) <117 mg/dL   COMPREHENSIVE METABOLIC PANEL     Status: Abnormal   Collection Time   07/27/12  5:08 AM      Component Value Range Comment   Sodium 136  135 - 145 mEq/L    Potassium 3.4 (*) 3.5 - 5.1 mEq/L    Chloride 101  96 - 112 mEq/L    CO2 25  19 - 32 mEq/L     Glucose, Bld 93  70 - 99 mg/dL    BUN 19  6 - 23 mg/dL    Creatinine, Ser 4.09  0.50 - 1.35 mg/dL    Calcium 9.0  8.4 - 81.1 mg/dL    Total Protein 6.7  6.0 - 8.3 g/dL    Albumin 3.4 (*) 3.5 - 5.2 g/dL    AST 24  0 - 37 U/L    ALT 26  0 - 53 U/L    Alkaline Phosphatase 57  39 - 117 U/L    Total Bilirubin 1.1  0.3 - 1.2 mg/dL    GFR calc non Af Amer 57 (*) >90 mL/min    GFR calc Af Amer 66 (*) >90 mL/min   CBC     Status: Normal   Collection Time   07/27/12  5:08 AM      Component Value Range Comment   WBC 7.5  4.0 - 10.5 K/uL    RBC 5.23  4.22 - 5.81 MIL/uL    Hemoglobin 14.3  13.0 - 17.0 g/dL    HCT 91.4  78.2 - 95.6 %  MCV 82.6  78.0 - 100.0 fL    MCH 27.3  26.0 - 34.0 pg    MCHC 33.1  30.0 - 36.0 g/dL    RDW 40.9  81.1 - 91.4 %    Platelets 217  150 - 400 K/uL   URIC ACID     Status: Abnormal   Collection Time   07/27/12  8:20 AM      Component Value Range Comment   Uric Acid, Serum 10.4 (*) 4.0 - 7.8 mg/dL    Ct Angio Chest 7/82/95:  Mild dependent atelectasis, and inferior right upper lobe atelectasis; relatively increased lucency in the upper lobes suggests focal air trapping; trace atherosclerosis including likely vascular calcification in the circumflex coronary artery; cardiomegaly with left atrial enlargement; Cholelithiasis  Review of Systems: General: no anorexia, weight gain or weight loss Cardiac: ankle edema present; no chest pain, dyspnea, orthopnea, PND,  or syncope Respiratory: no cough, sputum production or hemoptysis GI: no nausea, abdominal pain, emesis, diarrhea or constipation Integument: no significant lesions Neurologic: No muscle weakness or paralysis; no speech disturbance; no headache  Physical Exam: Blood pressure 155/140, pulse 78, temperature 97.1 F (36.2 C), temperature source Oral, resp. rate 24, height 6\' 1"  (1.854 m), weight 139.5 kg (307 lb 8.7 oz), SpO2 98.00%. Body mass index is 40.58 kg/(m^2). General-Well-developed; no acute  distress HEENT-[place]/AT; PERRL; EOM intact; conjunctiva and lids nl Neck-No JVD; no carotid bruits Endocrine-No thyromegaly Lungs-Clear lung fields; resonant percussion; normal I-to-E ratio Cardiovascular- normal PMI; normal S1 and S2; irregular rhythm Abdomen-BS normal; soft and non-tender without masses or organomegaly Musculoskeletal-No deformities, cyanosis or clubbing Neurologic-Nl cranial nerves; symmetric strength and tone Skin- Warm, no significant lesions Extremities-Nl distal pulses; trace edema  EKG: Atrial fibrillation with the slightly increased ventricular rate to 101 bpm; nonspecific ST-T wave abnormality.  Assessment/Plan:  Hypertension:  Blood pressure is fairly well-controlled; may need modest increase in antihypertensive medication.  Atrial fibrillation: Duration is unknown; heart rate is well controlled with modest medical therapy.  Risk factors for thromboembolism are fairly modest, and long-term anticoagulation may not be in patient's best interest.  Osborne Bing, MD 07/28/2012, 12:25 PM

## 2012-07-28 NOTE — Progress Notes (Signed)
Pt  transfered to room 202 on telemetry. Hr at this time is 78 to 82 in atrial fibrilation. . O2 is at 2l/min via [place].  Pt becomes sob when he absentmindedly removes O2. Dr Dietrich Pates in to examine. Lungs are clear but diminished. Bilateral ac IV's are patent and nsl'd   Transferred via w/c w/ assist due to pain when standing on feet.  Verbal transfer report given to Alinda Sierras RN on 200.

## 2012-07-29 DIAGNOSIS — M109 Gout, unspecified: Secondary | ICD-10-CM

## 2012-07-29 DIAGNOSIS — N390 Urinary tract infection, site not specified: Secondary | ICD-10-CM | POA: Diagnosis present

## 2012-07-29 LAB — MRSA CULTURE

## 2012-07-29 MED ORDER — SODIUM CHLORIDE 0.9 % IJ SOLN: 3.0000 mL | INTRAMUSCULAR | Status: DC | PRN

## 2012-07-29 MED ORDER — DILTIAZEM HCL ER COATED BEADS 240 MG PO CP24: 240.0000 mg | ORAL_CAPSULE | Freq: Every day | ORAL | Status: DC

## 2012-07-29 MED ORDER — INDOMETHACIN 25 MG PO CAPS: 50.0000 mg | ORAL_CAPSULE | Freq: Once | ORAL | Status: DC

## 2012-07-29 MED ORDER — ASPIRIN EC 325 MG PO TBEC: 325.0000 mg | DELAYED_RELEASE_TABLET | Freq: Every day | ORAL | Status: DC

## 2012-07-29 MED ORDER — COLCHICINE 0.6 MG PO TABS: ORAL_TABLET | ORAL | Status: DC

## 2012-07-29 MED ORDER — SODIUM CHLORIDE 0.9 % IJ SOLN
3.0000 mL | Freq: Two times a day (BID) | INTRAMUSCULAR | Status: DC
  Administered 2012-07-29: 3 mL via INTRAVENOUS
  Filled 2012-07-29: qty 3

## 2012-07-29 MED ORDER — LISINOPRIL 10 MG PO TABS: 10.0000 mg | ORAL_TABLET | Freq: Every day | ORAL | Status: DC

## 2012-07-29 MED ORDER — SODIUM CHLORIDE 0.9 % IV SOLN: 250.0000 mL | INTRAVENOUS | Status: DC | PRN

## 2012-07-29 MED ORDER — NITROFURANTOIN MONOHYD MACRO 100 MG PO CAPS: 100.0000 mg | ORAL_CAPSULE | Freq: Two times a day (BID) | ORAL | Status: DC

## 2012-07-29 NOTE — Progress Notes (Signed)
   Consulting cardiologist: Dr. Plymouth Bing  Subjective:   Up in chair, no sense of palpitations, no chest pain or breathlessness. Complains of bilateral foot pain due to "gout".   Objective:   Temp:  [97.5 F (36.4 C)-98.9 F (37.2 C)] 98.9 F (37.2 C) (10/01 0524) Pulse Rate:  [72-85] 85  (10/01 0524) Resp:  [20-24] 22  (10/01 0524) BP: (142-169)/(94-110) 169/94 mmHg (10/01 0524) SpO2:  [94 %-100 %] 97 % (10/01 0524) Weight:  [298 lb 3.2 oz (135.263 kg)] 298 lb 3.2 oz (135.263 kg) (10/01 0524) Last BM Date: 07/27/12  Filed Weights   07/27/12 0630 07/28/12 0500 07/29/12 0524  Weight: 295 lb 10.2 oz (134.1 kg) 307 lb 8.7 oz (139.5 kg) 298 lb 3.2 oz (135.263 kg)    Intake/Output Summary (Last 24 hours) at 07/29/12 1043 Last data filed at 07/29/12 0753  Gross per 24 hour  Intake   1080 ml  Output   1150 ml  Net    -70 ml   Telemetry: Remains in atrial fibrillation.  Exam:  General: No acute distress.  Lungs: Nonlabored, diminished but clear.  Cardiac: Irregularly irregular, no gallop.  Extremities: Trace edema.  Lab Results:  Basic Metabolic Panel:  Lab 07/28/12 4540 07/27/12 0508 07/26/12 1354 07/26/12 0827  NA 137 136 -- 138  K 4.6 3.4* -- 3.5  CL 107 101 -- 101  CO2 22 25 -- 24  GLUCOSE 108* 93 -- 113*  BUN 24* 19 -- 21  CREATININE 1.13 1.33 -- 1.34  CALCIUM 9.3 9.0 -- 9.3  MG -- -- 2.0 --    Liver Function Tests:  Lab 07/27/12 0508 07/26/12 1354  AST 24 21  ALT 26 30  ALKPHOS 57 61  BILITOT 1.1 0.8  PROT 6.7 6.9  ALBUMIN 3.4* 3.5    CBC:  Lab 07/27/12 0508 07/26/12 0827  WBC 7.5 7.6  HGB 14.3 15.4  HCT 43.2 45.5  MCV 82.6 80.2  PLT 217 216    Cardiac Enzymes:  Lab 07/27/12 0508 07/26/12 2208 07/26/12 1757  CKTOTAL -- -- 341*  CKMB -- -- 4.0  CKMBINDEX -- -- --  TROPONINI <0.30 <0.30 <0.30    BNP:  Basename 07/26/12 0827  PROBNP 272.5*    Coagulation:  Lab 07/26/12 1354  INR 1.08     Medications:   Scheduled  Medications:    . colchicine  0.6 mg Oral BID  . diltiazem  180 mg Oral Daily  . docusate sodium  100 mg Oral BID  . enoxaparin (LOVENOX) injection  140 mg Subcutaneous Q12H  . lisinopril  10 mg Oral Daily  . nitrofurantoin (macrocrystal-monohydrate)  100 mg Oral Q12H  . sodium chloride  3 mL Intravenous Q12H     Infusions:     PRN Medications:  sodium chloride, acetaminophen, ALPRAZolam, alum & mag hydroxide-simeth, guaiFENesin-dextromethorphan, haloperidol, haloperidol lactate, levalbuterol, metoprolol, morphine injection, ondansetron (ZOFRAN) IV, ondansetron, oxyCODONE, sodium chloride   Assessment:   1. Atrial fibrillation of uncertain duration, heart rate better controlled on medical therapy, now long-acting Cardizem CD 180 mg daily. Echocardiogram pending this morning.  2. Hypertension, Blood pressure not yet optimally controlled. May need to further advance ACE inhibitor.   Plan/Discussion:    I reviewed Dr. Marvel Plan consultation note. Will follow up on echocardiogram. Place him on aspirin for now, as candidacy for long-term anticoagulation is not clear at this point.   Jonelle Sidle, M.D., F.A.C.C.

## 2012-07-29 NOTE — Care Management Note (Unsigned)
    Page 1 of 1   07/29/2012     10:56:32 AM   CARE MANAGEMENT NOTE 07/29/2012  Patient:  Todd Page, Todd Page   Account Number:  [id number]  Date Initiated:  07/29/2012  Documentation initiated by:  Rosemary Holms  Subjective/Objective Assessment:   Pt lives at home with his 60 yr old mother. His PCP is Dr. Donette Larry with [facility]. Pt would eventually like a local PCP but is ok to continue with [facility] for now.     Action/Plan:   Pt is open to [facility] if needed. CSW recommended SW for home but must have RN or PT for a SW to be ordered.   Anticipated DC Date:  07/30/2012   Anticipated DC Plan:  HOME W HOME HEALTH SERVICES      DC Planning Services  CM consult      Choice offered to / List presented to:             Status of service:  In process, will continue to follow Medicare Important Message given?  YES (If response is "NO", the following Medicare IM given date fields will be blank) Date Medicare IM given:  07/29/2012 Date Additional Medicare IM given:    Discharge Disposition:    Per UR Regulation:    If discussed at Long Length of Stay Meetings, dates discussed:    Comments:  07/29/12 1030 Miyana Mordecai RN  BSN CM

## 2012-07-29 NOTE — Discharge Summary (Signed)
Physician Discharge Summary  Patient ID: Todd Page MRN: [id number] DOB/AGE: 04-Mar-1951 61 y.o.  Admit date: 07/26/2012 Discharge date: 07/29/2012  Discharge Diagnoses:  Principal Problem:  *Atrial fibrillation Active Problems:  Malignant hypertension  Gout  Systolic dysfunction  Obesity  Schizoaffective disorder UTI    Medication List     As of 07/29/2012  1:26 PM    TAKE these medications         aspirin EC 325 MG tablet   Take 1 tablet (325 mg total) by mouth daily.      colchicine 0.6 MG tablet   2 tablets as needed for gout attack      diltiazem 240 MG 24 hr capsule   Commonly known as: CARDIZEM CD   Take 1 capsule (240 mg total) by mouth daily.      lisinopril 10 MG tablet   Commonly known as: PRINIVIL,ZESTRIL   Take 1 tablet (10 mg total) by mouth daily.      nitrofurantoin (macrocrystal-monohydrate) 100 MG capsule   Commonly known as: MACROBID   Take 1 capsule (100 mg total) by mouth every 12 (twelve) hours.            Discharge Orders    Future Orders Please Complete By Expires   Diet - low sodium heart healthy      Activity as tolerated - No restrictions         Follow-up Information    Follow up with HUSAIN,KARRAR, MD. In 3 weeks.   Contact information:   [address] [place] [address] [phone]       Follow up with Buhler Bing, MD. In 2 weeks.   Contact information:   [address] [place] [address] [phone]          Disposition: 01-Home or Self Care  Discharged Condition: stable  Consults: Treatment Team:  Kathlen Brunswick, MD telepsychiatry  Labs:    Sodium       136 137     Potassium       3.4 4.6      Chloride       101 107     CO2       25 22     Mean Plasma Glucose       128      BUN       19 24     Creatinine, Ser       1.33 1.13     Calcium       9.0 9.3     GFR calc non Af Amer       57 69     GFR calc Af Amer       66  80      Glucose, Bld       93 108     Phosphorus       3.8      Magnesium       2.0      Alkaline Phosphatase       57      Albumin       3.4      Uric Acid, Serum        10.4     AST       24      ALT       26      Total Protein       6.7  Bilirubin, Direct       0.2      Indirect Bilirubin       0.6      Total Bilirubin       1.1       CARDIAC PROFILE    CK, MB       4.0      Total CK       341      Troponin I       <0.30       Pro B Natriuretic peptide (BNP)       272.5       CBC    WBC       7.5      RBC       5.23      Hemoglobin       14.3      HCT       43.2      MCV       82.6      MCH       27.3      MCHC       33.1      RDW       13.8      Platelets       217       DIFFERENTIAL    Neutrophils Relative       75      Lymphocytes Relative       13      Monocytes Relative       11      Eosinophils Relative       1      Basophils Relative       0      Neutro Abs       5.7      Lymphs Abs       1.0      Monocytes Absolute       0.8      Eosinophils Absolute       0.1      Basophils Absolute       0.0       OTHER HEMATOLOGY    Sed Rate       15       COAG OTHER    D-Dimer, Quant       0.85        PROTIME W/ INR    Prothrombin Time       13.9      INR       1.08       PTT    aPTT       27       DIABETES    Hemoglobin A1C       =6.5% Diagnostic of Diabetes Mellitus (if abnormal result is confirmed) 5.7-6.4% Increased risk of developing Diabetes Mellitus References:Diagnosis and Classification of Diabetes Mellitus,Diabetes Care,2011,34(Suppl 1):S62-S69 and Standards of Medical Care in .Marland KitchenMarland Kitchen"6.1 =6.5% Diagnostic of Diabetes Mellitus (if abnormal result is confirmed) 5.7-6.4% Increased risk of developing Diabetes Mellitus References:Diagnosis and Classification of Diabetes Mellitus,Diabetes Care,2011,34(Suppl 1):S62-S69 and Standards of Medical Care in ..." border=0 src="[email or web address]:///C:/PROGRAM%20FILES%[email or web address]([email or web address]/[email or web address]/EN-US/[email or web address]" width=5 height=10      Glucose, Bld        93 108      THYROID    TSH       1.442       URINALYSIS    Color, Urine  YELLOW      APPearance       HAZY      Specific Gravity, Urine       1.025      pH       6.0      Glucose, UA       NEGATIVE      Bilirubin Urine       NEGATIVE      Ketones, ur       NEGATIVE      Protein, ur       30      Urobilinogen, UA       0.2      Nitrite       POSITIVE      Leukocytes, UA       TRACE      Hgb urine dipstick       SMALL      WBC, UA       21-50      RBC / HPF       7-10      Bacteria, UA       MA        Diagnostics:  Ct Angio Chest Pe W/cm &/or Wo Cm  07/27/2012  *RADIOLOGY REPORT*  Clinical Data: Short of breath, elevated D-dimer evaluate for PE  CT ANGIOGRAPHY CHEST  Technique:  Multidetector CT imaging of the chest using the standard protocol during bolus administration of intravenous contrast. Multiplanar reconstructed images including MIPs were obtained and reviewed to evaluate the vascular anatomy.  Contrast: OMNIPAQUE IOHEXOL 350 MG/ML SOLN  Comparison: Chest x-ray obtained yesterday, 07/26/2012  Findings:  Mediastinum: Unremarkable thyroid gland and thoracic inlet. Scattered mediastinal lymph nodes are not enlarged by CT criteria. The largest node is a low right paratracheal node and it measures 9 mm in short axis.  The thoracic esophagus is unremarkable.  Mildly prominent mediastinal and epicardial fat.  Heart/Vascular: The pulmonary arteries are well opacified to the segmental level.  No definite central filling defect to the level of the segmental arteries.  Evaluation of the subsegmental arteries is limited, particularly in the lung bases were respiratory motion limits evaluation. Cardiomegaly with left atrial dilatation. Pulmonary arteries are normal in size.  Question punctate calcification in the circumflex coronary artery.  Conventional three-vessel arch anatomy.  No aneurysmal dilatation or dissection.  Lungs/Pleura: Mild dependent atelectasis bilaterally.   Respiratory motion throughout the lungs significantly limits evaluation for small pulmonary nodules.  Linear ground-glass attenuation in the inferior right upper lobe most consistent with atelectasis.  Small areas of relatively increased lucency in the bilateral upper lungs consistent with focal air trapping.  No definite pulmonary nodule identified.  No pneumothorax or pleural effusion.  Upper Abdomen: Radiopaque stones layers within the gallbladder lumen.  No significant distension or pericholecystic fluid or stranding in the visualized portions of the gallbladder.  Please note the gallbladder is incompletely imaged.  Otherwise, the visualized upper abdomen is unremarkable.  Bones: No acute fracture or aggressive appearing lytic or blastic osseous lesion.  Mild multilevel degenerative disc disease with anterior osteophyte formation in the lower thoracic spine.  IMPRESSION:  1.  No evidence of acute pulmonary embolus to the segmental level. Evaluation of the subsegmental pulmonary arteries significantly limited by respiratory motion.  2.  Mild dependent atelectasis, and inferior right upper lobe atelectasis. 3.  Areas of relatively increased lucency in the upper lobes suggest focal air trapping.  This may be related to small airways  disease. 4.  Trace atherosclerosis including likely vascular calcification in the circumflex coronary artery 5.  Cardiomegaly with left atrial enlargement 6.  Cholelithiasis   Original Report Authenticated By: Vilma Prader    US Venous Img Lower Bilateral  07/26/2012  *RADIOLOGY REPORT*  Clinical Data: Bilateral leg swelling.  BILATERAL LOWER EXTREMITY VENOUS DUPLEX ULTRASOUND  Technique:  Gray-scale sonography with graded compression, as well as color Doppler and duplex ultrasound, were performed to evaluate the deep venous system of both lower extremities from the level of the common femoral vein through the popliteal and proximal calf veins.  Spectral Doppler was utilized to evaluate  flow at rest and with distal augmentation maneuvers.  Comparison:  None.  Findings:  Normal compressibility of bilateral common femoral, superficial femoral, and popliteal veins is demonstrated, as well as the visualized proximal calf veins.  No filling defects to suggest DVT on grayscale or color Doppler imaging.  Doppler waveforms show normal direction of venous flow, normal respiratory phasicity and response to augmentation.  IMPRESSION: No evidence of deep vein thrombosis in either lower extremity.   Original Report Authenticated By: Florencia Reasons, M.D.    Dg Chest Portable 1 View  07/26/2012  *RADIOLOGY REPORT*  Clinical Data: Shortness of breath, leg swelling  PORTABLE CHEST - 1 VIEW  Comparison: 06/02/2012  Findings: Low lung volumes with mild bibasilar atelectasis.  No pleural effusion or pneumothorax.  The heart is top normal in size for inspiration.  IMPRESSION: No evidence of acute cardiopulmonary disease.   Original Report Authenticated By: Charline Bills, M.D.    Procedures: none  ZOX:WRUEAV fibrillation with rapid ventricular response Nonspecific ST and T wave abnormality rate 101  Echo Left ventricle: The cavity size was mildly dilated. Mild tomoderate LVH with disproportionate septal hypertrophy. Systolic function was mildly reduced. The estimated ejection fraction was in the range of 45% to 50%. Severe hypokinesis to akinesis of the basal-mid inferolateral myocardium. - Left atrium: The atrium was mildly to moderately dilated. - Right atrium: The atrium was mildly dilated.  Full Code   Hospital Course: See H&P for complete admission details. The patient is a 61 year old white male who presented with bilateral foot pain from gout attack. He has a history of previous gout. He also reportedly has a history of depression, schizoaffective disorder and hypertension. He's been off all of his medications. In the emergency room, he was noted to be very hypertensive with  diastolics above 100. Systolics above 170. He also was tachycardic ranging 100-140. Telemetry showed atrial fibrillation. He gives a vague history of previous heart problems, but details are unavailable. He had no reports of shortness of breath, chest pain or palpitations. While in the emergency room, he apparently had some bizarre behavior but was cooperative. Tele-psychiatry was consulted and felt patient had no problems with hallucinations, suicidal or homicidal ideation. Admitting physician felt that he had some delusions. He was noted to have an irregularly irregular fast heart rate, foot edema and erythema and tenderness of the toes consistent with gout.  Patient was started on Cardizem drip, colchicine, Lovenox, indomethacin. His uric acid level is elevated. His Cardizem was transitioned to oral. He also was started on an ACE inhibitor. TSH was normal. Echocardiogram showed slight LV dysfunction, and atrial enlargement. He had no evidence of heart failure. Because of his noncompliance, he may not be a good Coumadin candidate, however this may be considered in the future if atrial fibrillation remains a problem. For now he is on full dose aspirin a  day. His blood pressure is not optimally controlled but improved. Heart rate is controlled. He remains in nature fibrillation. Cardiology has been consulted and agrees with management. His gout pain is improved. Once his gout flare resolves, uric acid lowering agent would be beneficial. Patient has seen Dr. Eula Listen in the past and I've asked nursing secretary to arrange a followup appointment. I will defer allopurinol or similar to primary care provider. Social work has also referred patient back to his psychiatrist. Community services/outpatient case management have been arranged. We requested records from his primary care provider but have not yet received them. Patient will followup with cardiology as an outpatient. Patient has been pleasant, cooperative,  oriented and no evidence of psychosis while here.  He also had evidence of urinary tract infection on admission. He denies dysuria, fevers or chills, but will treat for a short course. Total time on the day of discharge greater than 30 minutes.   Discharge Exam:  Blood pressure 169/94, pulse 85, temperature 98.9 F (37.2 C), temperature source Oral, resp. rate 22, height 6\' 1"  (1.854 m), weight 135.263 kg (298 lb 3.2 oz), SpO2 97.00%.  Unchanged from 07/28/12  Signed: Crista Curb L 07/29/2012, 1:26 PM

## 2012-07-29 NOTE — Clinical Social Work Note (Signed)
Pt requesting ride home but stating he did not feel he could d/c home today as he was not ready. CSW arranged [facility] transport then pt states his brother will be picking him up this evening. RN confirmed this.    Derenda Fennel, [place] [phone]

## 2012-07-29 NOTE — Progress Notes (Signed)
Thank you to Rosemary Holms RN CM for this referral.  Patient is ineligible for services at this time due to his psych history and low cost utilization.  For any additional questions or new referrals please contact Anibal Henderson BSN RN [facility] Liaison at [phone].

## 2012-07-29 NOTE — Progress Notes (Signed)
Discharge instructions and prescriptions given, verbalized understanding, out in stable condition with staff via w/c. 

## 2012-07-29 NOTE — Clinical Social Work Note (Signed)
CSW attempted to meet w patient, but patient was asleep.  Left community resource list and contact information for psychiatrist in [place] for patient.  Will return when patient awake to discuss.  Santa Genera, LCSW Clinical Social Worker [phone])

## 2012-08-05 ENCOUNTER — Encounter (HOSPITAL_COMMUNITY): Payer: Self-pay

## 2012-08-05 ENCOUNTER — Emergency Department (HOSPITAL_COMMUNITY): Payer: Medicare Other

## 2012-08-05 ENCOUNTER — Encounter: Payer: Self-pay | Admitting: Physician Assistant

## 2012-08-05 ENCOUNTER — Emergency Department (HOSPITAL_COMMUNITY)
Admission: EM | Admit: 2012-08-05 | Discharge: 2012-08-05 | Disposition: A | Discharge status: Home / Self Care | Payer: Medicare Other | Attending: Emergency Medicine | Admitting: Emergency Medicine

## 2012-08-05 DIAGNOSIS — R064 Hyperventilation: Secondary | ICD-10-CM | POA: Insufficient documentation

## 2012-08-05 DIAGNOSIS — M79609 Pain in unspecified limb: Secondary | ICD-10-CM | POA: Insufficient documentation

## 2012-08-05 DIAGNOSIS — I10 Essential (primary) hypertension: Secondary | ICD-10-CM | POA: Insufficient documentation

## 2012-08-05 DIAGNOSIS — F411 Generalized anxiety disorder: Secondary | ICD-10-CM | POA: Insufficient documentation

## 2012-08-05 DIAGNOSIS — M7989 Other specified soft tissue disorders: Secondary | ICD-10-CM | POA: Insufficient documentation

## 2012-08-05 DIAGNOSIS — R000 Tachycardia, unspecified: Secondary | ICD-10-CM | POA: Insufficient documentation

## 2012-08-05 DIAGNOSIS — Z79899 Other long term (current) drug therapy: Secondary | ICD-10-CM | POA: Insufficient documentation

## 2012-08-05 DIAGNOSIS — M79673 Pain in unspecified foot: Secondary | ICD-10-CM

## 2012-08-05 DIAGNOSIS — I499 Cardiac arrhythmia, unspecified: Secondary | ICD-10-CM | POA: Insufficient documentation

## 2012-08-05 MED ORDER — OXYCODONE-ACETAMINOPHEN 5-325 MG PO TABS: 1.0000 | ORAL_TABLET | ORAL | Status: DC | PRN

## 2012-08-05 MED ORDER — SODIUM CHLORIDE 0.9 % IV SOLN
Freq: Once | INTRAVENOUS | Status: AC
  Administered 2012-08-05: 18:00:00 via INTRAVENOUS

## 2012-08-05 MED ORDER — DIAZEPAM 5 MG/ML IJ SOLN
5.0000 mg | Freq: Once | INTRAMUSCULAR | Status: AC
  Administered 2012-08-05: 5 mg via INTRAVENOUS
  Filled 2012-08-05: qty 2

## 2012-08-05 MED ORDER — COLCHICINE 0.6 MG PO TABS
0.6000 mg | ORAL_TABLET | Freq: Once | ORAL | Status: AC
  Administered 2012-08-05: 0.6 mg via ORAL
  Filled 2012-08-05: qty 1

## 2012-08-05 MED ORDER — DILTIAZEM HCL 25 MG/5ML IV SOLN
20.0000 mg | Freq: Once | INTRAVENOUS | Status: AC
  Administered 2012-08-05: 20 mg via INTRAVENOUS
  Filled 2012-08-05: qty 5

## 2012-08-05 MED ORDER — MORPHINE SULFATE 4 MG/ML IJ SOLN
4.0000 mg | Freq: Once | INTRAMUSCULAR | Status: AC
  Administered 2012-08-05: 4 mg via INTRAVENOUS
  Filled 2012-08-05: qty 1

## 2012-08-05 MED ORDER — KETOROLAC TROMETHAMINE 30 MG/ML IJ SOLN
30.0000 mg | Freq: Once | INTRAMUSCULAR | Status: AC
  Administered 2012-08-05: 30 mg via INTRAVENOUS
  Filled 2012-08-05: qty 1

## 2012-08-05 NOTE — ED Notes (Addendum)
Attempted to contact number provided by patient to arrange a ride home. Number disconnected.  Pt states he doesn't know any other number.  Reinforced with pt that he is unable to stay in department indefinitely.  Pt not eligible for transport via EMS as he is medically stable.  Pt states "So, you're just going to throw me out?  You're going to hear from me, you bitch!".  Reinforced with pt that finding transportation home is his responsibility. Pt continues to be very verbally aggressive to staff.  Pt escorted to waiting area by security.  Pt continues to state that he cannot walk, despite the fact that he told the physician he was walking around in the grocery store and began experiencing pain in his feet. (This is documented in physician's notes.)

## 2012-08-05 NOTE — ED Notes (Signed)
EMS unable to transport pt due to pt not being medically unstable or necessary to transport via ambulance. Decision made by EMS supervisor, and EDP.

## 2012-08-05 NOTE — ED Notes (Addendum)
Attempted second time to contact pt's brother, Todd Page, at number on record, unable to leave voice mail as mailbox is full. Attempted additional time to contact pt's mother at number on record, no answer, unable to leave voice mail.

## 2012-08-05 NOTE — ED Notes (Signed)
Pt stating that he doesn't want to go home and doesn't have a ride. Pt states he can't go home because he continues to have pain in feet.  Reinforced with pt that until his gout medication takes full effect, which may take a few days, he may continue to experience some level of pain. Explained to pt that he has been treated and there is no additional medical reason to keep pt in ED. Pt very angry about being discharged.

## 2012-08-05 NOTE — ED Notes (Signed)
edp aware pt anxious and afraid someone is trying to kill him.

## 2012-08-05 NOTE — ED Notes (Signed)
Pt continues to be in waiting area, yelling and screaming demanding to be taken home by EMS because his feet hurt so much he can't walk.  [employer] to department and arrangement made between EMS and [employer] that pt will be taken home by ambulance.  Pt now states that "I'm going to ask that the ambulance take me to [facility]."  Reinforced with pt that EMS will be unable to do so, since he has been evaluated and is medically stable.  Explained to pt that while he may not agree with the treatment he was provided with, it was appropriate and adequate. Also reinforced with pt that he has admitted to not taking medications previously prescribed for condition and that he also has the responsibility to follow treatment recommendations before stating that "they don't work".

## 2012-08-05 NOTE — ED Notes (Signed)
Pt yells at nurse that he needs pain medication. Nurse states she will get pain medication after obtaining IV access. Access obtained. Nurse returns to room with medication.  Pt sitting up in middle of bed hr-137, RR >40-asks nurse to help him adjust himself in bed. Rn states she will after giving him pain medication. Pt yells at nurse that she won't do anything for him. Nurse tells pt not to yell. Pt yells "I am the patient and you have to do what I say. Get out of my room, Bitch." Rn left room.

## 2012-08-05 NOTE — ED Notes (Signed)
EMS to department to discuss with physician regarding transport home and necessity of ambulance transport.

## 2012-08-05 NOTE — ED Notes (Signed)
Pt reports was diagnosed with gout a few days ago.  C/O bilateral feet pain.   Pt appears anxious.

## 2012-08-05 NOTE — ED Provider Notes (Addendum)
History  This chart was scribed for Raeford Razor, MD by Bennett Scrape. This patient was seen in room APA12/APA12 and the patient's care was started at 5:08PM.  CSN: [id number]  Arrival date & time 08/05/12  1655   First MD Initiated Contact with Patient 08/05/12 1708      Chief Complaint  Patient presents with  . Foot Pain    The history is provided by the patient. No language interpreter was used.   Todd Page is a 61 y.o. male with a h/o schizophrenia who presents to the Emergency Department complaining of approximately 3 weeks of constant bilateral foot pain described as sharp and burning. The left foot has pain that radiates from the toes to the ankle expect from the great toe. The right foot pain radiates from all of the toes to the ankle. He was seen for the same on 07/26/12 and was diagnosed with gout but states that he doesn't believe the diagnosis, because there is no infection line. Pain has worsened since the visit while he was walking at the grocery store but he denies taking the medications he was prescribed. He states that he has been diagnosed with one prior episode of gout through x-ray and is requesting to have an x-ray. He has a h/o A. Fib but denies this diagnosis and states that he has been non-complaint with taking his medications, because he can't get up to the pharmacy to fill them. He denies palpitations, CP, SOB, and abdominal pain as associated symptoms. He also has a h/o HTN and denies smoking and alcohol use.  Past Medical History  Diagnosis Date  . Hypertension   . Schizoaffective disorder     With depression  . Atrial fibrillation 06/2012    first identified in 06/2012; normal TSH  . Overweight     Past Surgical History  Procedure Date  . Cardiac surgery   . Colonoscopy 2006    normal screening examination    No family history on file.  History  Substance Use Topics  . Smoking status: Never Smoker   . Smokeless tobacco: Not on file  . Alcohol  Use: No      Review of Systems  Respiratory: Negative for chest tightness and shortness of breath.   Cardiovascular: Negative for chest pain and palpitations.  Musculoskeletal: Negative for back pain.       Positive for bilateral foot pain  All other systems reviewed and are negative.    Allergies  Review of patient's allergies indicates no known allergies.  Home Medications   Current Outpatient Rx  Name Route Sig Dispense Refill  . ASPIRIN EC 325 MG PO TBEC Oral Take 1 tablet (325 mg total) by mouth daily. 30 tablet 0  . COLCHICINE 0.6 MG PO TABS  2 tablets as needed for gout attack 20 tablet 0  . DILTIAZEM HCL ER COATED BEADS 240 MG PO CP24 Oral Take 1 capsule (240 mg total) by mouth daily. 30 capsule 0  . LISINOPRIL 10 MG PO TABS Oral Take 1 tablet (10 mg total) by mouth daily. 30 tablet 0  . NITROFURANTOIN MONOHYD MACRO 100 MG PO CAPS Oral Take 1 capsule (100 mg total) by mouth every 12 (twelve) hours. 8 capsule 0    Triage Vitals: BP 156/122  Pulse 122  Temp 97.9 F (36.6 C) (Oral)  Resp 30  Ht 6\' 1"  (1.854 m)  Wt 300 lb (136.079 kg)  BMI 39.58 kg/m2  SpO2 99%  Physical Exam  Nursing  note and vitals reviewed. Constitutional: He is oriented to person, place, and time. He appears well-developed and well-nourished.  HENT:  Head: Normocephalic and atraumatic.  Eyes: Conjunctivae normal and EOM are normal. Pupils are equal, round, and reactive to light.  Neck: Normal range of motion. Neck supple.  Cardiovascular: Normal heart sounds.  An irregularly irregular rhythm present. Tachycardia present.   Pulmonary/Chest: Breath sounds normal. No respiratory distress. He has no wheezes. He has no rales.       hyperventilating   Abdominal: Soft. Bowel sounds are normal.  Musculoskeletal: Normal range of motion.       Swelling to bilateral feet, right worse than left, no overlying skin changes, no bony tenderness, pain with ROM of both ankles, good DPs bilaterally    Neurological: He is alert and oriented to person, place, and time.  Skin: Skin is warm and dry.  Psychiatric: He has a normal mood and affect.       Extremely anxious    ED Course  Procedures (including critical care time)  DIAGNOSTIC STUDIES: Oxygen Saturation is 99% on room air, normal by my interpretation.    COORDINATION OF CARE: 5:21PM-Discussed treatment plan which includes IV medications and an x-ray with pt at bedside and pt agreed to plan.   Labs Reviewed - No data to display Dg Ankle Complete Left  08/05/2012  *RADIOLOGY REPORT*  Clinical Data: Lt ankle pain and swelling x several days. Hx gout. No injury  LEFT ANKLE COMPLETE - 3+ VIEW  Comparison: Report from 11/09/2001  Findings: Small plantar and Achilles calcaneal spurs are observed. No tibiotalar joint effusion observed.  No soft tissue calcifications, fracture, or acute bony finding noted.  IMPRESSION: 1.  Small calcaneal spurs.   Otherwise, no significant abnormality identified.   Original Report Authenticated By: Dellia Cloud, M.D.    EKG:  Rhythm: afib with rvr Rate: 124 Axis: normal Intervals: normal ST segments: NS ST changes    1. Foot pain   2. Atrial fibrillation with rapid ventricular response    MDM  61yM with foot pain and afib with RVR. Just admitted for same. Non-compliant with medications for gout or with cardizem. Pt rate controlled prior to discharge. Not complaining or CP, SOB or dizziness/lightheadedness. Pt instructed to fill prescriptions that had been given to him on discharge from hospital including colchicine and cardizem and given new prescription for percocet PRN. Pt with bizarre behavior in ED but did not seem to be acutely psychotic. Has hx of schizoaffective. Had psych eval during hospitalization. Feel he is safe for DC at this time.      I personally preformed the services scribed in my presence. The recorded information has been reviewed and considered. Raeford Razor,  MD.    Raeford Razor, MD 08/05/12 2010  Raeford Razor, MD 08/05/12 2010

## 2012-08-07 ENCOUNTER — Encounter: Payer: Medicare Other | Admitting: Physician Assistant

## 2012-09-20 ENCOUNTER — Encounter (HOSPITAL_COMMUNITY): Payer: Self-pay | Admitting: Emergency Medicine

## 2012-09-20 ENCOUNTER — Emergency Department (HOSPITAL_COMMUNITY): Payer: Medicare Other

## 2012-09-20 ENCOUNTER — Inpatient Hospital Stay (HOSPITAL_COMMUNITY)
Admission: EM | Admit: 2012-09-20 | Discharge: 2012-09-22 | DRG: 206 | Disposition: A | Discharge status: Home / Self Care | Payer: Medicare Other | Attending: Internal Medicine | Admitting: Internal Medicine

## 2012-09-20 DIAGNOSIS — Z6841 Body Mass Index (BMI) 40.0 and over, adult: Secondary | ICD-10-CM

## 2012-09-20 DIAGNOSIS — E669 Obesity, unspecified: Secondary | ICD-10-CM | POA: Diagnosis present

## 2012-09-20 DIAGNOSIS — I519 Heart disease, unspecified: Secondary | ICD-10-CM

## 2012-09-20 DIAGNOSIS — F411 Generalized anxiety disorder: Secondary | ICD-10-CM | POA: Diagnosis present

## 2012-09-20 DIAGNOSIS — F259 Schizoaffective disorder, unspecified: Secondary | ICD-10-CM | POA: Diagnosis present

## 2012-09-20 DIAGNOSIS — F329 Major depressive disorder, single episode, unspecified: Secondary | ICD-10-CM | POA: Diagnosis present

## 2012-09-20 DIAGNOSIS — Z9119 Patient's noncompliance with other medical treatment and regimen: Secondary | ICD-10-CM

## 2012-09-20 DIAGNOSIS — I4891 Unspecified atrial fibrillation: Secondary | ICD-10-CM | POA: Diagnosis present

## 2012-09-20 DIAGNOSIS — Z23 Encounter for immunization: Secondary | ICD-10-CM

## 2012-09-20 DIAGNOSIS — Z91199 Patient's noncompliance with other medical treatment and regimen due to unspecified reason: Secondary | ICD-10-CM

## 2012-09-20 DIAGNOSIS — I10 Essential (primary) hypertension: Secondary | ICD-10-CM | POA: Diagnosis present

## 2012-09-20 DIAGNOSIS — R0600 Dyspnea, unspecified: Secondary | ICD-10-CM

## 2012-09-20 DIAGNOSIS — I2510 Atherosclerotic heart disease of native coronary artery without angina pectoris: Secondary | ICD-10-CM | POA: Diagnosis present

## 2012-09-20 DIAGNOSIS — N390 Urinary tract infection, site not specified: Secondary | ICD-10-CM | POA: Diagnosis present

## 2012-09-20 DIAGNOSIS — J189 Pneumonia, unspecified organism: Secondary | ICD-10-CM | POA: Diagnosis present

## 2012-09-20 DIAGNOSIS — I428 Other cardiomyopathies: Secondary | ICD-10-CM | POA: Diagnosis present

## 2012-09-20 DIAGNOSIS — E662 Morbid (severe) obesity with alveolar hypoventilation: Principal | ICD-10-CM | POA: Diagnosis present

## 2012-09-20 DIAGNOSIS — F3289 Other specified depressive episodes: Secondary | ICD-10-CM | POA: Diagnosis present

## 2012-09-20 DIAGNOSIS — I420 Dilated cardiomyopathy: Secondary | ICD-10-CM

## 2012-09-20 DIAGNOSIS — M109 Gout, unspecified: Secondary | ICD-10-CM | POA: Diagnosis present

## 2012-09-20 HISTORY — DX: Heart disease, unspecified: I51.9

## 2012-09-20 HISTORY — DX: Patient's noncompliance with other medical treatment and regimen: Z91.19

## 2012-09-20 HISTORY — DX: Gout, unspecified: M10.9

## 2012-09-20 LAB — COMPREHENSIVE METABOLIC PANEL
ALT: 34 U/L (ref 0–53)
AST: 25 U/L (ref 0–37)
Alkaline Phosphatase: 65 U/L (ref 39–117)
CO2: 26 mEq/L (ref 19–32)
Calcium: 9.4 mg/dL (ref 8.4–10.5)
GFR calc Af Amer: 67 mL/min — ABNORMAL LOW (ref >90)
Glucose, Bld: 103 mg/dL — ABNORMAL HIGH (ref 70–99)
Potassium: 3.8 mEq/L (ref 3.5–5.1)
Sodium: 139 mEq/L (ref 135–145)
Total Protein: 7 g/dL (ref 6.0–8.3)

## 2012-09-20 LAB — URINALYSIS, ROUTINE W REFLEX MICROSCOPIC
Bilirubin Urine: NEGATIVE
Glucose, UA: NEGATIVE mg/dL
Specific Gravity, Urine: 1.02 (ref 1.005–1.030)
pH: 6.5 (ref 5.0–8.0)

## 2012-09-20 LAB — CBC WITH DIFFERENTIAL/PLATELET
Basophils Absolute: 0 10*3/uL (ref 0.0–0.1)
Eosinophils Absolute: 0.1 10*3/uL (ref 0.0–0.7)
Eosinophils Relative: 1 % (ref 0–5)
Lymphocytes Relative: 11 % — ABNORMAL LOW (ref 12–46)
Lymphs Abs: 1.6 10*3/uL (ref 0.7–4.0)
Neutrophils Relative %: 81 % — ABNORMAL HIGH (ref 43–77)
Platelets: 245 10*3/uL (ref 150–400)
RBC: 5.44 MIL/uL (ref 4.22–5.81)
RDW: 14.7 % (ref 11.5–15.5)
WBC: 14.2 10*3/uL — ABNORMAL HIGH (ref 4.0–10.5)

## 2012-09-20 LAB — URINE MICROSCOPIC-ADD ON

## 2012-09-20 LAB — TROPONIN I: Troponin I: <0.3 ng/mL (ref <0.30)

## 2012-09-20 LAB — PRO B NATRIURETIC PEPTIDE: Pro B Natriuretic peptide (BNP): 665 pg/mL — ABNORMAL HIGH (ref 0–125)

## 2012-09-20 MED ORDER — DEXTROSE 5 % IV SOLN
1.0000 g | INTRAVENOUS | Status: DC
  Administered 2012-09-20: 1 g via INTRAVENOUS
  Filled 2012-09-20 (×2): qty 10

## 2012-09-20 MED ORDER — DOCUSATE SODIUM 100 MG PO CAPS
100.0000 mg | ORAL_CAPSULE | Freq: Two times a day (BID) | ORAL | Status: DC
  Administered 2012-09-20 – 2012-09-22 (×4): 100 mg via ORAL
  Filled 2012-09-20 (×4): qty 1

## 2012-09-20 MED ORDER — DILTIAZEM HCL 100 MG IV SOLR
5.0000 mg/h | INTRAVENOUS | Status: DC
  Administered 2012-09-21: 5 mg/h via INTRAVENOUS
  Filled 2012-09-20: qty 100

## 2012-09-20 MED ORDER — OXYCODONE HCL 5 MG PO TABS: 5.0000 mg | ORAL_TABLET | ORAL | Status: DC | PRN

## 2012-09-20 MED ORDER — PNEUMOCOCCAL VAC POLYVALENT 25 MCG/0.5ML IJ INJ
0.5000 mL | INJECTION | INTRAMUSCULAR | Status: AC
  Administered 2012-09-21: 0.5 mL via INTRAMUSCULAR
  Filled 2012-09-20: qty 0.5

## 2012-09-20 MED ORDER — DEXTROSE 5 % IV SOLN
500.0000 mg | INTRAVENOUS | Status: DC
  Administered 2012-09-20: 500 mg via INTRAVENOUS
  Filled 2012-09-20 (×2): qty 500

## 2012-09-20 MED ORDER — DILTIAZEM HCL 25 MG/5ML IV SOLN
15.0000 mg | Freq: Once | INTRAVENOUS | Status: AC
  Administered 2012-09-20: 15 mg via INTRAVENOUS

## 2012-09-20 MED ORDER — INFLUENZA VIRUS VACC SPLIT PF IM SUSP
0.5000 mL | INTRAMUSCULAR | Status: AC
  Administered 2012-09-21: 0.5 mL via INTRAMUSCULAR
  Filled 2012-09-20: qty 0.5

## 2012-09-20 MED ORDER — SODIUM CHLORIDE 0.9 % IJ SOLN
3.0000 mL | Freq: Two times a day (BID) | INTRAMUSCULAR | Status: DC
  Administered 2012-09-20: 3 mL via INTRAVENOUS
  Administered 2012-09-21: 6 mL via INTRAVENOUS
  Administered 2012-09-22: 3 mL via INTRAVENOUS

## 2012-09-20 MED ORDER — ALBUTEROL SULFATE (5 MG/ML) 0.5% IN NEBU
2.5000 mg | INHALATION_SOLUTION | RESPIRATORY_TRACT | Status: DC | PRN
  Administered 2012-09-21: 2.5 mg via RESPIRATORY_TRACT
  Filled 2012-09-20: qty 0.5

## 2012-09-20 MED ORDER — IPRATROPIUM BROMIDE 0.02 % IN SOLN
0.5000 mg | Freq: Once | RESPIRATORY_TRACT | Status: AC
  Administered 2012-09-20: 0.5 mg via RESPIRATORY_TRACT
  Filled 2012-09-20: qty 2.5

## 2012-09-20 MED ORDER — SODIUM CHLORIDE 0.9 % IV SOLN
250.0000 mL | INTRAVENOUS | Status: DC | PRN
  Administered 2012-09-20: 10 mL via INTRAVENOUS

## 2012-09-20 MED ORDER — ENOXAPARIN SODIUM 40 MG/0.4ML ~~LOC~~ SOLN
40.0000 mg | SUBCUTANEOUS | Status: DC
  Administered 2012-09-20 – 2012-09-21 (×2): 40 mg via SUBCUTANEOUS
  Filled 2012-09-20 (×3): qty 0.4

## 2012-09-20 MED ORDER — IOHEXOL 350 MG/ML SOLN
100.0000 mL | Freq: Once | INTRAVENOUS | Status: AC | PRN
  Administered 2012-09-20: 100 mL via INTRAVENOUS

## 2012-09-20 MED ORDER — CEFTRIAXONE SODIUM 1 G IJ SOLR: 1.0000 g | Freq: Once | INTRAMUSCULAR | Status: DC

## 2012-09-20 MED ORDER — ONDANSETRON HCL 4 MG PO TABS: 4.0000 mg | ORAL_TABLET | Freq: Four times a day (QID) | ORAL | Status: DC | PRN

## 2012-09-20 MED ORDER — ALBUTEROL SULFATE (5 MG/ML) 0.5% IN NEBU
5.0000 mg | INHALATION_SOLUTION | Freq: Once | RESPIRATORY_TRACT | Status: AC
  Administered 2012-09-20: 5 mg via RESPIRATORY_TRACT
  Filled 2012-09-20: qty 1

## 2012-09-20 MED ORDER — TRAZODONE HCL 50 MG PO TABS: 50.0000 mg | ORAL_TABLET | Freq: Every evening | ORAL | Status: DC | PRN

## 2012-09-20 MED ORDER — ONDANSETRON HCL 4 MG/2ML IJ SOLN: 4.0000 mg | Freq: Four times a day (QID) | INTRAMUSCULAR | Status: DC | PRN

## 2012-09-20 MED ORDER — DEXTROSE 5 % IV SOLN
1.0000 g | INTRAVENOUS | Status: DC
  Administered 2012-09-21: 1 g via INTRAVENOUS
  Filled 2012-09-20 (×2): qty 10

## 2012-09-20 MED ORDER — ASPIRIN EC 81 MG PO TBEC
81.0000 mg | DELAYED_RELEASE_TABLET | Freq: Every day | ORAL | Status: DC
  Administered 2012-09-21 – 2012-09-22 (×2): 81 mg via ORAL
  Filled 2012-09-20 (×2): qty 1

## 2012-09-20 MED ORDER — ALUM & MAG HYDROXIDE-SIMETH 200-200-20 MG/5ML PO SUSP: 30.0000 mL | Freq: Four times a day (QID) | ORAL | Status: DC | PRN

## 2012-09-20 MED ORDER — DEXTROSE 5 % IV SOLN
500.0000 mg | INTRAVENOUS | Status: DC
  Administered 2012-09-21: 500 mg via INTRAVENOUS
  Filled 2012-09-20 (×2): qty 500

## 2012-09-20 MED ORDER — SODIUM CHLORIDE 0.9 % IJ SOLN: 3.0000 mL | INTRAMUSCULAR | Status: DC | PRN

## 2012-09-20 MED ORDER — ACETAMINOPHEN 650 MG RE SUPP: 650.0000 mg | Freq: Four times a day (QID) | RECTAL | Status: DC | PRN

## 2012-09-20 MED ORDER — SODIUM CHLORIDE 0.9 % IJ SOLN
3.0000 mL | Freq: Two times a day (BID) | INTRAMUSCULAR | Status: DC
  Administered 2012-09-22: 3 mL via INTRAVENOUS

## 2012-09-20 MED ORDER — DILTIAZEM HCL 100 MG IV SOLR
10.0000 mg/h | Freq: Once | INTRAVENOUS | Status: AC
  Administered 2012-09-20: 10 mg/h via INTRAVENOUS
  Filled 2012-09-20: qty 100

## 2012-09-20 MED ORDER — ACETAMINOPHEN 325 MG PO TABS
650.0000 mg | ORAL_TABLET | Freq: Four times a day (QID) | ORAL | Status: DC | PRN
  Administered 2012-09-20: 650 mg via ORAL
  Filled 2012-09-20: qty 2

## 2012-09-20 NOTE — ED Provider Notes (Signed)
History     CSN: [id number]  Arrival date & time 09/20/12  1749   First MD Initiated Contact with Patient 09/20/12 1755      Chief Complaint  Patient presents with  . Shortness of Breath  . Tachycardia    (Consider location/radiation/quality/duration/timing/severity/associated sxs/prior treatment) HPIRoy T Page is a 61 y.o. male pertinent medical history of a schizoaffective disorder also atrial fibrillation who is overweight was called by EMS for shortness of breath. When he arrived he was alert and oriented and saturating 92% on room air. He denies any chest pain, denies any cough, fevers or chills. He says he has not been taking his medication for at least a month. Patient's heart rate per EMS was 120 to 170s in paroxysmal A. Fib. Patient says his symptoms of shortness of breath have been severe, constant for the last day, he has not tried anything to alleviate them. He's had no associated abdominal pain, hemoptysis, nausea vomiting, diarrhea, leg pain. He has had some leg swelling but this is chronic for him.   Past Medical History  Diagnosis Date  . Hypertension   . Schizoaffective disorder     With depression  . Atrial fibrillation 06/2012    first identified in 06/2012; normal TSH  . Overweight   . Gout     Past Surgical History  Procedure Date  . Cardiac surgery   . Colonoscopy 2006    normal screening examination    History reviewed. No pertinent family history.  History  Substance Use Topics  . Smoking status: Never Smoker   . Smokeless tobacco: Not on file  . Alcohol Use: No      Review of Systems At least 10pt or greater review of systems completed and are negative except where specified in the HPI.  Allergies  Review of patient's allergies indicates no known allergies.  Home Medications   Current Outpatient Rx  Name  Route  Sig  Dispense  Refill  . ASPIRIN EC 325 MG PO TBEC   Oral   Take 1 tablet (325 mg total) by mouth daily.   30 tablet    0   . COLCHICINE 0.6 MG PO TABS      2 tablets as needed for gout attack   20 tablet   0   . DILTIAZEM HCL ER COATED BEADS 240 MG PO CP24   Oral   Take 1 capsule (240 mg total) by mouth daily.   30 capsule   0   . LISINOPRIL 10 MG PO TABS   Oral   Take 1 tablet (10 mg total) by mouth daily.   30 tablet   0   . NITROFURANTOIN MONOHYD MACRO 100 MG PO CAPS   Oral   Take 1 capsule (100 mg total) by mouth every 12 (twelve) hours.   8 capsule   0   . OXYCODONE-ACETAMINOPHEN 5-325 MG PO TABS   Oral   Take 1 tablet by mouth every 4 (four) hours as needed for pain.   10 tablet   0     There were no vitals taken for this visit.  Physical Exam  Nursing notes reviewed.  Electronic medical record reviewed. VITAL SIGNS:   Filed Vitals:   09/20/12 1756 09/20/12 1805  BP: 186/114   Pulse: 125   Resp: 45   Height: 6' (1.829 m)   Weight: 300 lb (136.079 kg)   SpO2: 100% 99%   CONSTITUTIONAL: Awake, oriented, appears non-toxic HENT: Atraumatic, normocephalic, oral mucosa  pink and moist, airway patent. Nares patent without drainage. External ears normal. EYES: Conjunctiva clear, EOMI, PERRLA NECK: Trachea midline, non-tender, supple CARDIOVASCULAR: Tachycardic irregularly irregular rhythm, No murmurs, rubs, gallops. Small well-healed scar near the xiphoid process PULMONARY/CHEST: Clear to auscultation, no rhonchi, scant occasional wheeze, no rales. Symmetrical breath sounds. Non-tender. ABDOMINAL: Non-distended, morbidly obese, soft, non-tender - no rebound or guarding.  BS normal. NEUROLOGIC: Non-focal, moving all four extremities, no gross sensory or motor deficits. EXTREMITIES: No clubbing, cyanosis,. 2+ pitting edema in the lower extremities SKIN: Warm, Dry, No erythema, No rash. Scattered pustules all over the patient's trunk  ED Course  CRITICAL CARE Performed by: Jones Skene Authorized by: Jones Skene Total critical care time: 30 minutes Critical care time  was exclusive of separately billable procedures and treating other patients. Critical care was necessary to treat or prevent imminent or life-threatening deterioration of the following conditions: respiratory failure. Critical care was time spent personally by me on the following activities: evaluation of patient's response to treatment, ordering and performing treatments and interventions, pulse oximetry, re-evaluation of patient's condition, ordering and review of laboratory studies, examination of patient, discussions with consultants, ordering and review of radiographic studies, review of old charts and obtaining history from patient or surrogate.   (including critical care time)  Date: 09/20/2012  Rate: 132  Rhythm: Atrial fibrillation  QRS Axis: normal  Intervals: normal  ST/T Wave abnormalities: normal  Conduction Disutrbances: none  Narrative Interpretation: Initial fibrillation with rapid ventricular response   No change from prior EKG dated 08/05/2012 - nonischemic EKG  Labs Reviewed  CBC WITH DIFFERENTIAL - Abnormal; Notable for the following:    WBC 14.2 (*)     Neutrophils Relative 81 (*)     Neutro Abs 11.5 (*)     Lymphocytes Relative 11 (*)     All other components within normal limits  COMPREHENSIVE METABOLIC PANEL - Abnormal; Notable for the following:    Glucose, Bld 103 (*)     GFR calc non Af Amer 58 (*)     GFR calc Af Amer 67 (*)     All other components within normal limits  D-DIMER, QUANTITATIVE - Abnormal; Notable for the following:    D-Dimer, Quant 0.61 (*)     All other components within normal limits  URINALYSIS, ROUTINE W REFLEX MICROSCOPIC - Abnormal; Notable for the following:    APPearance HAZY (*)     Hgb urine dipstick TRACE (*)     Protein, ur 30 (*)     Urobilinogen, UA 2.0 (*)     All other components within normal limits  PRO B NATRIURETIC PEPTIDE - Abnormal; Notable for the following:    Pro B Natriuretic peptide (BNP) 665.0 (*)     All  other components within normal limits  URINE MICROSCOPIC-ADD ON - Abnormal; Notable for the following:    Bacteria, UA MANY (*)     All other components within normal limits  TROPONIN I  URINE CULTURE  CULTURE, BLOOD (ROUTINE X 2)  CULTURE, BLOOD (ROUTINE X 2)   Ct Angio Chest W/cm &/or Wo Cm  09/20/2012  *RADIOLOGY REPORT*  Clinical Data: Shortness of breath, tachycardia.  CT ANGIOGRAPHY CHEST  Technique:  Multidetector CT imaging of the chest using the standard protocol during bolus administration of intravenous contrast. Multiplanar reconstructed images including MIPs were obtained and reviewed to evaluate the vascular anatomy.  Contrast: OMNIPAQUE IOHEXOL 350 MG/ML SOLN  Comparison: 07/27/2012  Findings: There is fairly good  contrast opacification of the pulmonary artery branches.  No discrete filling defect to suggest acute PE.Patient breathing during the acquisition degrades some of the images. Adequate contrast opacification of the thoracic aorta with no evidence of dissection, aneurysm, or stenosis. There is classic 3-vessel brachiocephalic arch anatomy.  Patchy coronary and aortic calcifications.  Cardiomegaly with left atrial enlargement. No pleural or pericardial effusion.  Sub centimeter left axillary, prevascular, right paratracheal,   and pretracheal lymph nodes. Sub centimeter bilateral hilar lymph nodes, left greater than right.  Stable plate-like atelectasis or scarring in the right middle lobe.  Some linear scarring or subsegmental atelectasis now evident in the posterior basal segments of both lower lobes.  No confluent airspace consolidation or overt interstitial edema. Spurring in the lower thoracic spine.  Multiple sub centimeter partially calcified stones layer in the dependent aspect of the gallbladder.  Remainder visualized upper abdomen unremarkable.  IMPRESSION:  1.  Negative for acute PE or thoracic aortic dissection. 2.  Atherosclerosis, including . coronary artery  disease. Please note that although the presence of coronary artery calcium documents the presence of coronary artery disease, the severity of this disease and any potential stenosis cannot be assessed on this non-gated CT examination.  Assessment for potential risk factor modification, dietary therapy or pharmacologic therapy may be warranted, if clinically indicated. 3.  Cholelithiasis   Original Report Authenticated By: D. Andria Rhein, MD    Dg Chest Port 1 View  09/20/2012  *RADIOLOGY REPORT*  Clinical Data: Shortness of breath.  PORTABLE CHEST - 1 VIEW  Comparison: 07/26/2012  Findings: More prominent opacity at the right lung base compared to the last several chest x-rays may potentially represent acute pneumonia.  No pulmonary edema or visible pleural effusions. Stable cardiomegaly.  IMPRESSION: More prominent right basilar pulmonary opacity which may represent acute infiltrate.   Original Report Authenticated By: Irish Lack, M.D.      1. Atrial fibrillation with RVR   2. Dyspnea       MDM  Todd Page is a 61 y.o. male noncompliant with medications the history of atrial fibrillation presents in likely A. fib with RVR, patient's having shortness of breath is unclear whether not there is also a congestive heart failure component or pneumonia as well. Basic labs, cardiac markers, chest x-ray and EKG.  EKG shows the patient is an atrial fibrillation with rapid ventricular response could be contributing to his shortness of breath, given the 50 mg bolus of diltiazem will see if he converts.  Patient will require infusion for rate control of his atrial fibrillation. Chest x-ray shows a possible right lower lobe opacity suggestive of possible developing pneumonia. This could of triggered atrial circulation or he could of been in atrial fibrillation the entire time. D-dimer of course is positive, we'll follow that up with a CT of chest with angiogram.  Urinalysis is suggestive of possible UTI,  he had complained of no dysuria or frequency. Patient will be covered with antibiotics for community-acquired pneumonia. Patient is not exhibiting septic physiology. He is currently rate controlled.   BNP is mildly elevated at 665.  This patient has multifactorial problem for her shortness of breath triggered by medication noncompliance, his atrial fibrillation which is probably worsened his congestive heart failure, and he may have a pneumonia as well. Will admit patient - discussed with Dr. Phillips Odor hospitalist for admission.  No PE by my read on CT the chest. Official radiology read still pending      Jones Skene, MD  09/20/12 2135 

## 2012-09-20 NOTE — ED Notes (Signed)
ems called out for SOB. Pt arrived alert/oriented/nonrebreather. Was 92% ra pta. Pt denies cp. Pt is noncompliant tachycardia 120-170. A fib with rapid rate noted on ekg from ems. Color wnl. Obvious sob noted.

## 2012-09-21 ENCOUNTER — Encounter (HOSPITAL_COMMUNITY): Payer: Self-pay | Admitting: Internal Medicine

## 2012-09-21 DIAGNOSIS — Z91199 Patient's noncompliance with other medical treatment and regimen due to unspecified reason: Secondary | ICD-10-CM

## 2012-09-21 DIAGNOSIS — Z9119 Patient's noncompliance with other medical treatment and regimen: Secondary | ICD-10-CM

## 2012-09-21 DIAGNOSIS — J189 Pneumonia, unspecified organism: Secondary | ICD-10-CM

## 2012-09-21 DIAGNOSIS — I10 Essential (primary) hypertension: Secondary | ICD-10-CM

## 2012-09-21 HISTORY — DX: Patient's noncompliance with other medical treatment and regimen: Z91.19

## 2012-09-21 HISTORY — DX: Patient's noncompliance with other medical treatment and regimen due to unspecified reason: Z91.199

## 2012-09-21 LAB — BASIC METABOLIC PANEL
Calcium: 9 mg/dL (ref 8.4–10.5)
GFR calc Af Amer: 67 mL/min — ABNORMAL LOW (ref >90)
GFR calc non Af Amer: 58 mL/min — ABNORMAL LOW (ref >90)
Sodium: 140 mEq/L (ref 135–145)

## 2012-09-21 LAB — HEMOGLOBIN A1C
Hgb A1c MFr Bld: 5.8 % — ABNORMAL HIGH (ref <5.7)
Mean Plasma Glucose: 120 mg/dL — ABNORMAL HIGH (ref <117)

## 2012-09-21 LAB — TROPONIN I
Troponin I: <0.3 ng/mL (ref <0.30)
Troponin I: <0.3 ng/mL (ref <0.30)

## 2012-09-21 LAB — CBC
MCH: 27.9 pg (ref 26.0–34.0)
Platelets: 191 10*3/uL (ref 150–400)
RBC: 4.98 MIL/uL (ref 4.22–5.81)
WBC: 10 10*3/uL (ref 4.0–10.5)

## 2012-09-21 LAB — HIV ANTIBODY (ROUTINE TESTING W REFLEX): HIV: NONREACTIVE

## 2012-09-21 MED ORDER — LISINOPRIL 5 MG PO TABS
5.0000 mg | ORAL_TABLET | Freq: Every day | ORAL | Status: DC
  Administered 2012-09-21 – 2012-09-22 (×2): 5 mg via ORAL
  Filled 2012-09-21 (×2): qty 1

## 2012-09-21 MED ORDER — DILTIAZEM HCL 60 MG PO TABS
60.0000 mg | ORAL_TABLET | Freq: Four times a day (QID) | ORAL | Status: DC
  Administered 2012-09-21 – 2012-09-22 (×5): 60 mg via ORAL
  Filled 2012-09-21 (×5): qty 1

## 2012-09-21 MED ORDER — DEXTROSE 5 % IV SOLN
INTRAVENOUS | Status: AC
  Filled 2012-09-21: qty 10

## 2012-09-21 NOTE — H&P (Signed)
[name] History and Physical  ARTH NICASTRO WUJ:[id number] DOB: 15-Aug-1951 DOA: 09/20/2012  Referring physician: Weber Cooks PCP: No primary provider on file.  Specialists: None  Chief Complaint: Dyspnea  HPI: Todd Page is a 61 y.o. male with PMH significant for schizoaffective disorder, A-Fib, medication non-adherence, and obesity who presented to the Ed via [facility] after he developed worsening shortness of breath at home, lower extremity edema and palpitations. In ED he was in A-Fi with RVR and a CXR showed RLL opacity, possible infiltrate. Patient denies fever, chills or cough. No chest pain. Has been unusually lethargic for the past few weeks, has to rest several times a day before he can complete basic tasks or errands.  Review of Systems:  Negative except for HPI.  Past Medical History  Diagnosis Date  . Hypertension   . Schizoaffective disorder     With depression  . Atrial fibrillation 06/2012    first identified in 06/2012; normal TSH  . Overweight   . Gout    Past Surgical History  Procedure Date  . Cardiac surgery   . Colonoscopy 2006    normal screening examination   Social History:  reports that he has never smoked. He does not have any smokeless tobacco history on file. He reports that he does not drink alcohol or use illicit drugs. Lives at home with his 12 yo mother.  No Known Allergies  Family History  Problem Relation Age of Onset  . Hypertension Mother   . Liver disease Father     Prior to Admission medications   Not on File   Physical Exam: Filed Vitals:   09/21/12 0045 09/21/12 0100 09/21/12 0115 09/21/12 0130  BP: 149/88 149/98    Pulse: 80 82 82 69  Temp:      TempSrc:      Resp: 26 27 27  37  Height:      Weight:      SpO2: 95% 95% 97% 96%     General: obese, pleasant, cooperative, NAD  Eyes: normal  ENT: normal  Neck: supple  Cardiovascular: IRIR, no murmur  Respiratory: Scattered crackles, no dullness, good air  movement  Abdomen: soft, NT, active BS  Skin: no rashes, 1+edema in BLE  Musculoskeletal: normal, pulses 2+ in extremities  Psychiatric: normal  Neurologic: non-focal  Labs on Admission:  Basic Metabolic Panel:  Lab 09/20/12 9562  NA 139  K 3.8  CL 102  CO2 26  GLUCOSE 103*  BUN 14  CREATININE 1.30  CALCIUM 9.4  MG --  PHOS --   Liver Function Tests:  Lab 09/20/12 1808  AST 25  ALT 34  ALKPHOS 65  BILITOT 0.6  PROT 7.0  ALBUMIN 3.7    Lab 09/20/12 1808  WBC 14.2*  NEUTROABS 11.5*  HGB 15.2  HCT 45.5  MCV 83.6  PLT 245   Cardiac Enzymes:  Lab 09/20/12 2007 09/20/12 1808  CKTOTAL -- --  CKMB -- --  CKMBINDEX -- --  TROPONINI <0.30 <0.30    BNP (last 3 results)  Basename 09/20/12 1808 07/26/12 0827  PROBNP 665.0* 272.5*   CBG: No results found for this basename: GLUCAP:5 in the last 168 hours  Radiological Exams on Admission: Ct Angio Chest W/cm &/or Wo Cm  09/20/2012  *RADIOLOGY REPORT*  Clinical Data: Shortness of breath, tachycardia.  CT ANGIOGRAPHY CHEST  Technique:  Multidetector CT imaging of the chest using the standard protocol during bolus administration of intravenous contrast. Multiplanar reconstructed images including  MIPs were obtained and reviewed to evaluate the vascular anatomy.  Contrast: OMNIPAQUE IOHEXOL 350 MG/ML SOLN  Comparison: 07/27/2012  Findings: There is fairly good contrast opacification of the pulmonary artery branches.  No discrete filling defect to suggest acute PE.Patient breathing during the acquisition degrades some of the images. Adequate contrast opacification of the thoracic aorta with no evidence of dissection, aneurysm, or stenosis. There is classic 3-vessel brachiocephalic arch anatomy.  Patchy coronary and aortic calcifications.  Cardiomegaly with left atrial enlargement. No pleural or pericardial effusion.  Sub centimeter left axillary, prevascular, right paratracheal,   and pretracheal lymph nodes. Sub  centimeter bilateral hilar lymph nodes, left greater than right.  Stable plate-like atelectasis or scarring in the right middle lobe.  Some linear scarring or subsegmental atelectasis now evident in the posterior basal segments of both lower lobes.  No confluent airspace consolidation or overt interstitial edema. Spurring in the lower thoracic spine.  Multiple sub centimeter partially calcified stones layer in the dependent aspect of the gallbladder.  Remainder visualized upper abdomen unremarkable.  IMPRESSION:  1.  Negative for acute PE or thoracic aortic dissection. 2.  Atherosclerosis, including . coronary artery disease. Please note that although the presence of coronary artery calcium documents the presence of coronary artery disease, the severity of this disease and any potential stenosis cannot be assessed on this non-gated CT examination.  Assessment for potential risk factor modification, dietary therapy or pharmacologic therapy may be warranted, if clinically indicated. 3.  Cholelithiasis   Original Report Authenticated By: D. Andria Rhein, MD    Dg Chest Port 1 View  09/20/2012  *RADIOLOGY REPORT*  Clinical Data: Shortness of breath.  PORTABLE CHEST - 1 VIEW  Comparison: 07/26/2012  Findings: More prominent opacity at the right lung base compared to the last several chest x-rays may potentially represent acute pneumonia.  No pulmonary edema or visible pleural effusions. Stable cardiomegaly.  IMPRESSION: More prominent right basilar pulmonary opacity which may represent acute infiltrate.   Original Report Authenticated By: Irish Lack, M.D.     EKG: A-Fib with RVR rate 140  Assessment/Plan Active Problems:  Atrial fibrillation  Obesity  Schizoaffective disorder  UTI (urinary tract infection)  CAP (community acquired pneumonia)   1. A-Fib with RVR, medication non-adherence, not taking medication at home, not on anticoagulation, known history, in ED his rates were in 140's, blood  pressure in normal range, O2 sats >92% on 2L [place].   Admitted to step down  Diltiazem gtt titration  TSH, CMET ok, BNP mildly elevated   Possible PNA/UTI trigger or infection  Cycle CEs  Will need anticoagulation for A-Fib, adherence may be an issue Pradaxa vs. Coumadin  2. PNA findings on CXR but not confimed on CT Chest to R/O PE, no cough or fever  Empirically treating for CAP with Azithro and Rocephin  3. UTI, 11-20 WBC, await Culture  Empiric Rocephin  May have retention, may need Flomax added to meds if culture positive  4. Schizoaffective Disorder  Stable for now, no psychosis, affect expressive but appropriate  No meds for now, needs outpatient psych follow  5. 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 Blocker, Diuretic   Code Status: Full Code Family Communication: Discussed plan of care with patient  Disposition Plan: Home when medically stable 2-3 days.  Time spent: 70 minutes  [name] Pager [phone]  If 7PM-7AM, please contact night-coverage [email or web address] Password [email or web address]  09/21/2012, 2:57 AM

## 2012-09-21 NOTE — Progress Notes (Signed)
PT HAS ALREADY TAKEN PO CARDIZEM. IV CARDIZEM NOW TURNED OFF. HR IN 60'S AND 70'S IN CONTROLED ATRIAL FIB. PT DENIES ANT CHEST PAIN,DIZZINESS OR SOB AT THIS TIME. PT UP IN CHAIR AT WINDOW.

## 2012-09-21 NOTE — Progress Notes (Signed)
Subjective: The patient is sitting up in bed. He complains of mild shortness of breath, less than yesterday. He denies chest pain or palpitations. He denies cough.  Objective: Vital signs in last 24 hours: Filed Vitals:   09/21/12 0615 09/21/12 0700 09/21/12 0803 09/21/12 0813  BP: 164/99 141/86 136/71   Pulse: 75  80   Temp:      TempSrc:      Resp: 24 30 23    Height:      Weight:      SpO2: 95%  99% 99%    Intake/Output Summary (Last 24 hours) at 09/21/12 0919 Last data filed at 09/21/12 0847  Gross per 24 hour  Intake 1307.09 ml  Output    300 ml  Net 1007.09 ml    Weight change:   Physical exam: General: Obese 61 year old Caucasian man sitting up in bed, in no acute distress. Lungs: Decreased breath sounds in the bases, otherwise clear. Heart: Irregular, irregular. Abdomen: Morbidly obese, positive bowel sounds, soft, nontender, nondistended. Extremities: Trace of pedal edema. Neurologic: He is alert and oriented x2. Cranial nerves II through XII are grossly intact.  Lab Results: Basic Metabolic Panel:  Basename 09/21/12 0412 09/20/12 1808  NA 140 139  K 3.6 3.8  CL 103 102  CO2 28 26  GLUCOSE 109* 103*  BUN 11 14  CREATININE 1.30 1.30  CALCIUM 9.0 9.4  MG -- --  PHOS -- --   Liver Function Tests:  [facility] 09/20/12 1808  AST 25  ALT 34  ALKPHOS 65  BILITOT 0.6  PROT 7.0  ALBUMIN 3.7   No results found for this basename: LIPASE:2,AMYLASE:2 in the last 72 hours No results found for this basename: AMMONIA:2 in the last 72 hours CBC:  Basename 09/21/12 0412 09/20/12 1808  WBC 10.0 14.2*  NEUTROABS -- 11.5*  HGB 13.9 15.2  HCT 42.0 45.5  MCV 84.3 83.6  PLT 191 245   Cardiac Enzymes:  Basename 09/21/12 0412 09/20/12 2007 09/20/12 1808  CKTOTAL -- -- --  CKMB -- -- --  CKMBINDEX -- -- --  TROPONINI <0.30 <0.30 <0.30   BNP:  Basename 09/20/12 1808  PROBNP 665.0*   D-Dimer:  Alvira Philips 09/20/12 1808  DDIMER 0.61*   CBG: No results  found for this basename: GLUCAP:6 in the last 72 hours Hemoglobin A1C: No results found for this basename: HGBA1C in the last 72 hours Fasting Lipid Panel: No results found for this basename: CHOL,HDL,LDLCALC,TRIG,CHOLHDL,LDLDIRECT in the last 72 hours Thyroid Function Tests: No results found for this basename: TSH,T4TOTAL,FREET4,T3FREE,THYROIDAB in the last 72 hours Anemia Panel: No results found for this basename: VITAMINB12,FOLATE,FERRITIN,TIBC,IRON,RETICCTPCT in the last 72 hours Coagulation: No results found for this basename: LABPROT:2,INR:2 in the last 72 hours Urine Drug Screen: Drugs of Abuse     Component Value Date/Time   LABOPIA NONE DETECTED 06/02/2012 2132   COCAINSCRNUR NONE DETECTED 06/02/2012 2132   LABBENZ NONE DETECTED 06/02/2012 2132   AMPHETMU NONE DETECTED 06/02/2012 2132   THCU NONE DETECTED 06/02/2012 2132   LABBARB NONE DETECTED 06/02/2012 2132    Alcohol Level: No results found for this basename: ETH:2 in the last 72 hours Urinalysis:  Basename 09/20/12 1850  COLORURINE YELLOW  LABSPEC 1.020  PHURINE 6.5  GLUCOSEU NEGATIVE  HGBUR TRACE*  BILIRUBINUR NEGATIVE  KETONESUR NEGATIVE  PROTEINUR 30*  UROBILINOGEN 2.0*  NITRITE NEGATIVE  LEUKOCYTESUR NEGATIVE   Misc. Labs:   Micro: Recent Results (from the past 240 hour(s))  CULTURE, BLOOD (ROUTINE X 2)  Status: Normal (Preliminary result)   Collection Time   09/20/12  8:03 PM      Component Value Range Status Comment   Specimen Description BLOOD LEFT HAND   Final    Special Requests BOTTLES DRAWN AEROBIC AND ANAEROBIC 6CC   Final    Culture NO GROWTH 1 DAY   Final    Report Status PENDING   Incomplete   CULTURE, BLOOD (ROUTINE X 2)     Status: Normal (Preliminary result)   Collection Time   09/20/12  8:07 PM      Component Value Range Status Comment   Specimen Description LEFT ANTECUBITAL   Final    Special Requests BOTTLES DRAWN AEROBIC AND ANAEROBIC 6CC   Final    Culture NO GROWTH 1 DAY   Final      Report Status PENDING   Incomplete   MRSA PCR SCREENING     Status: Normal   Collection Time   09/20/12 10:10 PM      Component Value Range Status Comment   MRSA by PCR NEGATIVE  NEGATIVE Final     Studies/Results: Ct Angio Chest W/cm &/or Wo Cm  09/20/2012  *RADIOLOGY REPORT*  Clinical Data: Shortness of breath, tachycardia.  CT ANGIOGRAPHY CHEST  Technique:  Multidetector CT imaging of the chest using the standard protocol during bolus administration of intravenous contrast. Multiplanar reconstructed images including MIPs were obtained and reviewed to evaluate the vascular anatomy.  Contrast: OMNIPAQUE IOHEXOL 350 MG/ML SOLN  Comparison: 07/27/2012  Findings: There is fairly good contrast opacification of the pulmonary artery branches.  No discrete filling defect to suggest acute PE.Patient breathing during the acquisition degrades some of the images. Adequate contrast opacification of the thoracic aorta with no evidence of dissection, aneurysm, or stenosis. There is classic 3-vessel brachiocephalic arch anatomy.  Patchy coronary and aortic calcifications.  Cardiomegaly with left atrial enlargement. No pleural or pericardial effusion.  Sub centimeter left axillary, prevascular, right paratracheal,   and pretracheal lymph nodes. Sub centimeter bilateral hilar lymph nodes, left greater than right.  Stable plate-like atelectasis or scarring in the right middle lobe.  Some linear scarring or subsegmental atelectasis now evident in the posterior basal segments of both lower lobes.  No confluent airspace consolidation or overt interstitial edema. Spurring in the lower thoracic spine.  Multiple sub centimeter partially calcified stones layer in the dependent aspect of the gallbladder.  Remainder visualized upper abdomen unremarkable.  IMPRESSION:  1.  Negative for acute PE or thoracic aortic dissection. 2.  Atherosclerosis, including . coronary artery disease. Please note that although the presence of  coronary artery calcium documents the presence of coronary artery disease, the severity of this disease and any potential stenosis cannot be assessed on this non-gated CT examination.  Assessment for potential risk factor modification, dietary therapy or pharmacologic therapy may be warranted, if clinically indicated. 3.  Cholelithiasis   Original Report Authenticated By: D. Andria Rhein, MD    Dg Chest Port 1 View  09/20/2012  *RADIOLOGY REPORT*  Clinical Data: Shortness of breath.  PORTABLE CHEST - 1 VIEW  Comparison: 07/26/2012  Findings: More prominent opacity at the right lung base compared to the last several chest x-rays may potentially represent acute pneumonia.  No pulmonary edema or visible pleural effusions. Stable cardiomegaly.  IMPRESSION: More prominent right basilar pulmonary opacity which may represent acute infiltrate.   Original Report Authenticated By: Irish Lack, M.D.     Medications:  Scheduled:   . [COMPLETED] albuterol  5 mg Nebulization Once  . aspirin EC  81 mg Oral Daily  . azithromycin  500 mg Intravenous Q24H  . cefTRIAXone (ROCEPHIN)  IV  1 g Intravenous Q24H  . [COMPLETED] diltiazem (CARDIZEM) infusion  10 mg/hr Intravenous Once  . [COMPLETED] diltiazem  15 mg Intravenous Once  . diltiazem  60 mg Oral Q6H  . docusate sodium  100 mg Oral BID  . enoxaparin (LOVENOX) injection  40 mg Subcutaneous Q24H  . influenza  inactive virus vaccine  0.5 mL Intramuscular Tomorrow-1000  . [COMPLETED] ipratropium  0.5 mg Nebulization Once  . pneumococcal 23 valent vaccine  0.5 mL Intramuscular Tomorrow-1000  . sodium chloride  3 mL Intravenous Q12H  . sodium chloride  3 mL Intravenous Q12H  . [DISCONTINUED] azithromycin  500 mg Intravenous Q24H  . [DISCONTINUED] cefTRIAXone (ROCEPHIN) IVPB 1 gram/50 mL D5W  1 g Intravenous Q24H  . [DISCONTINUED] cefTRIAXone  1 g Intramuscular Once   Continuous:   . diltiazem (CARDIZEM) infusion 5 mg/hr (09/21/12 0700)   ZOX:WRUEAV  chloride, acetaminophen, acetaminophen, albuterol, alum & mag hydroxide-simeth, [COMPLETED] iohexol, ondansetron (ZOFRAN) IV, ondansetron, oxyCODONE, sodium chloride, traZODone  Assessment: Active Problems:  Atrial fibrillation  Obesity  Schizoaffective disorder  Systolic dysfunction  UTI (urinary tract infection)  CAP (community acquired pneumonia)  Dilated cardiomyopathy  Noncompliance   1. Chronic atrial fibrillation with rapid ventricular response. His heart rate has normalized, but his revisit is still irregular, on the diltiazem drip. The patient was discharged to home on diltiazem and aspirin during the previous hospitalization, but he has been noncompliant. Neither did he not fill the prescriptions nor did he followup with his primary care physician or [facility] cardiology. He is noted to have systolic dysfunction with an ejection fraction of 45%. Ideally, he should be on an anticoagulant, however because of his noncompliance and psychiatric history, aspirin will be continued instead. His troponin I is within normal limits x3. His pro BNP is mildly elevated, but no radiographic evidence of decompensated heart failure. His TSH was within normal limits in September 2013.  Systolic dysfunction/dilated cardiomyopathy. No evidence of decompensated heart failure. Will restart ACE inhibitor.  Dyspnea. Likely secondary to #1, possible pneumonia, and morbid obesity. CT angiogram reveals no pulmonary embolism.  Hypertension. He has a history of malignant hypertension. Will restart diltiazem orally and lisinopril which he had been prescribed in September.  Possible community-acquired pneumonia. We'll continue azithromycin and Rocephin empirically.  Urinary tract infection. We'll continue Rocephin as ordered.  Schizoaffective disorder. Appears to be stable. He admittedly has not seen his psychiatrist this year.   Plan:  1. By mouth diltiazem has been started at every 6 hour dosing, but it  will eventually be transitioned to once daily dosing. The diltiazem drip will be titrated off. 2. Restart lisinopril. 3. Order EKG in a.m. 4. Consider discharge tomorrow.   LOS: 1 day   Jeffree Cazeau 09/21/2012, 9:19 AM

## 2012-09-21 NOTE — Plan of Care (Signed)
Problem: Consults Goal: Pneumonia Patient Education See Patient Educatio Module for education specifics. Outcome: Progressing Right Basilar opacities noted  Problem: Phase I Progression Outcomes Goal: Dyspnea controlled at rest Outcome: Not Progressing Patient remains dyspneic on 2L oxygen Goal: Code status addressed with pt/family Outcome: Completed/Met Date Met:  09/21/12 Patient is full code Goal: Initial discharge plan identified Outcome: Progressing Patient lives at home with 85yo mother Goal: Voiding-avoid urinary catheter unless indicated Outcome: Progressing Patient admitted with atrial fibrillation, on cardizem gtt

## 2012-09-21 NOTE — Plan of Care (Signed)
Problem: Consults Goal: Atrial Arhythmia Patient Education (See Patient Education module for education specifics.) Outcome: Progressing exitcare handout given and discussed Goal: Skin Care Protocol Initiated - if Braden Score 18 or less If consults are not indicated, leave blank or document N/A Outcome: Progressing Patient with multiple bites over torso to ankle, scratched and scabbed over bilaterally Goal: Nutrition Consult-if indicated Outcome: Progressing Patient states he has a bad diet.  Fast food etc.  Problem: Phase I Progression Outcomes Goal: Anticoagulation Therapy per MD order Outcome: Progressing lovenox treatment begun Goal: Heart rate or rhythm control medication Outcome: Progressing cardizem gtt at 10cc/hr Goal: Pain controlled with appropriate interventions Outcome: Progressing Patient c/o headache, tylenol for that Goal: Initial discharge plan identified Outcome: Progressing Lives at home with 85yo mother

## 2012-09-21 NOTE — Progress Notes (Signed)
WHEN PT UP TO [facility] FOR BM, BRIGHT RED BLOOD FOUND ON CLOTH CHUCK PAD IN CHAIR HE WAS SITTING IN. UNABLE TO DISCOVER WHERE BLOOD ORIGINATED FROM WHEN PT'S ANTERIOR AND POSTERIOR PERINEUM  INSPECTED.

## 2012-09-21 NOTE — Progress Notes (Signed)
Pharmacy Note:  Patient receiving Rocephin and Zithromax.  These antibiotics do not require pharmacokinetic monitoring or renal adjustment.  Plan: Sign off  [facility] 09/21/2012 8:19 AM

## 2012-09-22 ENCOUNTER — Encounter (HOSPITAL_COMMUNITY): Payer: Self-pay | Admitting: Internal Medicine

## 2012-09-22 DIAGNOSIS — N390 Urinary tract infection, site not specified: Secondary | ICD-10-CM

## 2012-09-22 DIAGNOSIS — I519 Heart disease, unspecified: Secondary | ICD-10-CM

## 2012-09-22 DIAGNOSIS — R0989 Other specified symptoms and signs involving the circulatory and respiratory systems: Secondary | ICD-10-CM

## 2012-09-22 LAB — BASIC METABOLIC PANEL
BUN: 12 mg/dL (ref 6–23)
CO2: 25 mEq/L (ref 19–32)
Chloride: 102 mEq/L (ref 96–112)
Creatinine, Ser: 1.14 mg/dL (ref 0.50–1.35)
Potassium: 3.9 mEq/L (ref 3.5–5.1)

## 2012-09-22 LAB — LEGIONELLA ANTIGEN, URINE: Legionella Antigen, Urine: NEGATIVE

## 2012-09-22 LAB — MAGNESIUM: Magnesium: 2 mg/dL (ref 1.5–2.5)

## 2012-09-22 MED ORDER — LISINOPRIL 5 MG PO TABS: 5.0000 mg | ORAL_TABLET | Freq: Every day | ORAL | Status: DC

## 2012-09-22 MED ORDER — DILTIAZEM HCL ER COATED BEADS 240 MG PO TB24: 240.0000 mg | ORAL_TABLET | Freq: Every day | ORAL | Status: DC

## 2012-09-22 MED ORDER — ALPRAZOLAM 0.25 MG PO TABS: 0.2500 mg | ORAL_TABLET | Freq: Three times a day (TID) | ORAL | Status: DC | PRN

## 2012-09-22 MED ORDER — ASPIRIN EC 325 MG PO TBEC: 325.0000 mg | DELAYED_RELEASE_TABLET | Freq: Every day | ORAL | Status: DC

## 2012-09-22 MED ORDER — CEFUROXIME AXETIL 500 MG PO TABS: 500.0000 mg | ORAL_TABLET | Freq: Two times a day (BID) | ORAL | Status: DC

## 2012-09-22 NOTE — Discharge Summary (Signed)
Physician Discharge Summary  Todd Page QMV:[id number] DOB: November 02, 1950 DOA: 09/20/2012  PCP: Dr. Elenora Gamma [facility].  Admit date: 09/20/2012 Discharge date: 09/22/2012  Time spent: Greater than 30 minutes  Recommendations for Outpatient Follow-up:  1. The patient will followup with [facility] cardiology PA as scheduled. 2. The patient will followup with his primary care physician as scheduled.  Discharge Diagnoses:  1. Chronic atrial fibrillation with rapid ventricular response. 2. Systolic dysfunction with an ejection fraction of 45% per echocardiogram September 2013. 3. Noncompliance. 4. Urinary tract infection. 5. Hypertension. 6. Morbid obesity. 7. Schizoaffective disorder. 8. Anxiousness. 9. Chronic dyspnea, possibly secondary to obesity hypoventilation syndrome.  Discharge Condition: Improved and stable.  Diet recommendation: Heart healthy.  Filed Weights   09/20/12 1756 09/20/12 2309  Weight: 136.079 kg (300 lb) 141.5 kg (311 lb 15.2 oz)    History of present illness:  The patient is a 61 year old man with a history significant for schizoaffective disorder, chronic atrial fibrillation, and hypertension, who presented to the emergency department on 09/20/2012 with a chief complaint of shortness of breath. In the emergency department, he was noted to be tachycardic with a heart rate in the 140s. His EKG revealed atrial fibrillation with rapid ventricular response. His chest x-ray revealed no pulmonary edema or pleural effusions, but with prominent right basilar pulmonary opacity which may have represented an acute infiltrate. His d-dimer was elevated. Therefore, a CT angiogram of his chest was ordered. It revealed no evidence of PE and no evidence of infiltrates or edema, however there were atherosclerotic changes in his coronary arteries. He was admitted for further evaluation and management.  Hospital Course:  The patient was started on a diltiazem drip after he was  bolused intravenously in the emergency department. Anticoagulation was contemplated, however, because of his noncompliance, aspirin was restarted. The patient was hospitalized in September 2013 for the same, however, he did not get any of his prescriptions filled nor did he followup with his primary care physician or cardiology. He was noted to have an ejection fraction of 45% in September 2013. Lisinopril was restarted given his systolic dysfunction. The patient had no evidence of decompensated heart failure. His urinalysis was noted to be consistent with infection, therefore, Rocephin was ordered. Azithromycin was added for potential community-acquired pneumonia, but was later discontinued due to a lack of radiographic evidence and no clinical sign of pneumonia other than shortness of breath. For further evaluation, a number of studies were ordered. His troponin I was negative x4. His hemoglobin A1c was 5.8. His TSH was within normal limits at 0.7. His HIV was nonreactive. Both Legionella antigen and strep pneumo urinary antigen was negative. Blood cultures were negative to date.  The patient was reported to have a small amount of blood on his bed, however it was unclear where the blood came from. A rectal exam was performed. His stool was brown and guaiac-negative. The scrotum revealed no active bleeding. There was no active bleeding in his urethra. He was not anemic during the hospitalization.  The patient's followup EKG revealed atrial fibrillation with a heart rate of 84 beats per minute. Because of his ongoing dyspnea, his oxygen saturations were assessed on room air at rest and with activity. They remained in the low to mid 90s. He was advised to lose weight by eating less food and by walking 10-15 minutes daily. It was believed that his shortness of breath may have been secondary to morbid obesity. The diltiazem drip was titrated off. He was  restarted on oral diltiazem. He was discharged to home on once  daily dosing of diltiazem and lisinopril. He was encouraged to become more compliant with taking his medications and with keeping his appointments with his specialists and primary care physician. He voiced understanding.   Procedures:  None  Consultations:  None  Discharge Exam: Filed Vitals:   09/22/12 0800 09/22/12 0830 09/22/12 1310 09/22/12 1446  BP:   178/93 154/82  Pulse: 115 97 90 88  Temp:    97.9 F (36.6 C)  TempSrc:    Oral  Resp: 9 26  22   Height:      Weight:      SpO2: 92% 93%  93%    General: Obese Caucasian man sitting up in bed, in no acute distress. Cardiovascular: Irregular, irregular. Respiratory: Clear to auscultation bilaterally. Scrotum: Circumcised male with no active urethral bleeding or discharge. No scrotal edema or nodules. Rectal: Stool is brown and guaiac-negative. No masses palpated in the rectal vault. Unable to palpate the prostate.  Discharge Instructions  Discharge Orders    Future Appointments: Provider: Department: Dept Phone: Center:   10/01/2012 1:00 PM Dyann Kief, PA [facility] [place] [phone] [id number]     Future Orders Please Complete By Expires   Diet - low sodium heart healthy      Increase activity slowly      Discharge instructions      Comments:   Followup with cardiology as scheduled. You will need to schedule an appointment with your primary care physician to followup in one week. You will need to call Dr. Anthony Sar office for a followup appointment if desired. Take medications as prescribed. Try to lose weight by eating less and walking 10-15 minutes daily.       Medication List     As of 09/22/2012  3:11 PM    TAKE these medications         ALPRAZolam 0.25 MG tablet   Commonly known as: XANAX   Take 1 tablet (0.25 mg total) by mouth 3 (three) times daily as needed for anxiety.      aspirin EC 325 MG tablet   Take 1 tablet (325 mg total) by mouth daily.      cefUROXime 500 MG  tablet   Commonly known as: CEFTIN   Take 1 tablet (500 mg total) by mouth 2 (two) times daily. ANTIBIOTIC TO TREAT YOUR URINARY TRACT INFECTION. STARTING TOMORROW, TAKE FOR 7 MORE DAYS.      diltiazem 240 MG 24 hr tablet   Commonly known as: CARDIZEM LA   Take 1 tablet (240 mg total) by mouth daily. For treatment of your elevated heart rate and high blood pressure.      lisinopril 5 MG tablet   Commonly known as: PRINIVIL,ZESTRIL   Take 1 tablet (5 mg total) by mouth daily. For treatment of your high blood pressure and your heart.           Follow-up Information    Follow up with [place]ville Bing, MD. On 09/30/2012. (Your appointment is on 09/30/12 at 1:00pm)    Contact information:   [address] [place] [address] [phone]       Follow up with Georgann Housekeeper, MD. On 10/03/2012. (Your appointment is on 10/03/12 at 10:00 am)    Contact information:   [address] [place] [address] [phone]           The results of significant diagnostics from this  hospitalization (including imaging, microbiology, ancillary and laboratory) are listed below for reference.    Significant Diagnostic Studies: Ct Angio Chest W/cm &/or Wo Cm  09/20/2012  *RADIOLOGY REPORT*  Clinical Data: Shortness of breath, tachycardia.  CT ANGIOGRAPHY CHEST  Technique:  Multidetector CT imaging of the chest using the standard protocol during bolus administration of intravenous contrast. Multiplanar reconstructed images including MIPs were obtained and reviewed to evaluate the vascular anatomy.  Contrast: OMNIPAQUE IOHEXOL 350 MG/ML SOLN  Comparison: 07/27/2012  Findings: There is fairly good contrast opacification of the pulmonary artery branches.  No discrete filling defect to suggest acute PE.Patient breathing during the acquisition degrades some of the images. Adequate contrast opacification of the thoracic aorta with no evidence of dissection, aneurysm, or stenosis. There  is classic 3-vessel brachiocephalic arch anatomy.  Patchy coronary and aortic calcifications.  Cardiomegaly with left atrial enlargement. No pleural or pericardial effusion.  Sub centimeter left axillary, prevascular, right paratracheal,   and pretracheal lymph nodes. Sub centimeter bilateral hilar lymph nodes, left greater than right.  Stable plate-like atelectasis or scarring in the right middle lobe.  Some linear scarring or subsegmental atelectasis now evident in the posterior basal segments of both lower lobes.  No confluent airspace consolidation or overt interstitial edema. Spurring in the lower thoracic spine.  Multiple sub centimeter partially calcified stones layer in the dependent aspect of the gallbladder.  Remainder visualized upper abdomen unremarkable.  IMPRESSION:  1.  Negative for acute PE or thoracic aortic dissection. 2.  Atherosclerosis, including . coronary artery disease. Please note that although the presence of coronary artery calcium documents the presence of coronary artery disease, the severity of this disease and any potential stenosis cannot be assessed on this non-gated CT examination.  Assessment for potential risk factor modification, dietary therapy or pharmacologic therapy may be warranted, if clinically indicated. 3.  Cholelithiasis   Original Report Authenticated By: D. Andria Rhein, MD    Dg Chest Port 1 View  09/20/2012  *RADIOLOGY REPORT*  Clinical Data: Shortness of breath.  PORTABLE CHEST - 1 VIEW  Comparison: 07/26/2012  Findings: More prominent opacity at the right lung base compared to the last several chest x-rays may potentially represent acute pneumonia.  No pulmonary edema or visible pleural effusions. Stable cardiomegaly.  IMPRESSION: More prominent right basilar pulmonary opacity which may represent acute infiltrate.   Original Report Authenticated By: Irish Lack, M.D.     Microbiology: Recent Results (from the past 240 hour(s))  CULTURE, BLOOD (ROUTINE X  2)     Status: Normal (Preliminary result)   Collection Time   09/20/12  8:03 PM      Component Value Range Status Comment   Specimen Description BLOOD LEFT HAND   Final    Special Requests BOTTLES DRAWN AEROBIC AND ANAEROBIC 6CC   Final    Culture NO GROWTH 2 DAYS   Final    Report Status PENDING   Incomplete   CULTURE, BLOOD (ROUTINE X 2)     Status: Normal (Preliminary result)   Collection Time   09/20/12  8:07 PM      Component Value Range Status Comment   Specimen Description LEFT ANTECUBITAL   Final    Special Requests BOTTLES DRAWN AEROBIC AND ANAEROBIC 6CC   Final    Culture NO GROWTH 2 DAYS   Final    Report Status PENDING   Incomplete   MRSA PCR SCREENING     Status: Normal   Collection Time   09/20/12  10:10 PM      Component Value Range Status Comment   MRSA by PCR NEGATIVE  NEGATIVE Final      Labs: Basic Metabolic Panel:  Lab 09/22/12 7829 09/21/12 0412 09/20/12 1808  NA 137 140 139  K 3.9 3.6 3.8  CL 102 103 102  CO2 25 28 26   GLUCOSE 114* 109* 103*  BUN 12 11 14   CREATININE 1.14 1.30 1.30  CALCIUM 9.2 9.0 9.4  MG 2.0 -- --  PHOS -- -- --   Liver Function Tests:  Lab 09/20/12 1808  AST 25  ALT 34  ALKPHOS 65  BILITOT 0.6  PROT 7.0  ALBUMIN 3.7   No results found for this basename: LIPASE:5,AMYLASE:5 in the last 168 hours No results found for this basename: AMMONIA:5 in the last 168 hours CBC:  Lab 09/21/12 0412 09/20/12 1808  WBC 10.0 14.2*  NEUTROABS -- 11.5*  HGB 13.9 15.2  HCT 42.0 45.5  MCV 84.3 83.6  PLT 191 245   Cardiac Enzymes:  Lab 09/21/12 1315 09/21/12 0412 09/20/12 2007 09/20/12 1808  CKTOTAL -- -- -- --  CKMB -- -- -- --  CKMBINDEX -- -- -- --  TROPONINI <0.30 <0.30 <0.30 <0.30   BNP: BNP (last 3 results)  Basename 09/20/12 1808 07/26/12 0827  PROBNP 665.0* 272.5*   CBG: No results found for this basename: GLUCAP:5 in the last 168 hours     Signed:  Vannak Montenegro  [name] 09/22/2012, 3:11  PM

## 2012-09-22 NOTE — Progress Notes (Signed)
UR Chart Review Completed  

## 2012-09-22 NOTE — Progress Notes (Signed)
PT UP ABULATING IN HALL W/ ASSISTANCE ON ROMM AIR LOWEST O2 ST 92%. MEAN O2 SAT 95% TOLERATED WELL. NO SOB.

## 2012-09-22 NOTE — Progress Notes (Signed)
PT BEING TRANSFERRED TO ROOM 214 ON TELEMETRY UNTIL DISCHARGED AFTER 1200 MEDS GIVEN. ALERT AND ORIENTED. HR 60'S IN CONTROLED ATRIAL FIBRILATION. O2 SAT 96% ON ROOMAIR. NO C/O PAIN OR DISTRESS. VERBAL REPORT GIVEN TO LISA COVENINGTON RN ON 200. DR Janene Harvey OF TRANSFER TO NEW ROOM.

## 2012-09-22 NOTE — Progress Notes (Signed)
Discharge instructions and prescriptions given, verbalized understanding, out via w/c in stable condition with staff. 

## 2012-09-22 NOTE — Plan of Care (Signed)
Problem: Phase II Progression Outcomes Goal: Ventricular heart rate < 100/min Outcome: Completed/Met Date Met:  09/22/12 cardizem gtt weaned to off.  Goal: Discharge plan established Outcome: Progressing To home

## 2012-09-23 LAB — URINE CULTURE: Colony Count: >=100000

## 2012-09-25 LAB — CULTURE, BLOOD (ROUTINE X 2): Culture: NO GROWTH

## 2012-10-01 ENCOUNTER — Encounter: Payer: Medicare Other | Admitting: Physician Assistant

## 2012-10-09 NOTE — Progress Notes (Signed)
This encounter was created in error - please disregard.

## 2012-10-23 ENCOUNTER — Telehealth: Payer: Self-pay | Admitting: Adult Health

## 2012-10-23 ENCOUNTER — Encounter: Payer: Medicare Other | Admitting: Adult Health

## 2012-10-23 NOTE — Telephone Encounter (Signed)
Patient no show for appointment.  Called but there was no answer or no machine.  Letter mailed. / tgs

## 2012-11-10 ENCOUNTER — Emergency Department (HOSPITAL_COMMUNITY)
Admission: EM | Admit: 2012-11-10 | Discharge: 2012-11-10 | Disposition: A | Discharge status: Home / Self Care | Payer: Medicare Other | Attending: Emergency Medicine | Admitting: Emergency Medicine

## 2012-11-10 ENCOUNTER — Emergency Department (HOSPITAL_COMMUNITY): Payer: Medicare Other

## 2012-11-10 ENCOUNTER — Encounter (HOSPITAL_COMMUNITY): Payer: Self-pay | Admitting: Emergency Medicine

## 2012-11-10 DIAGNOSIS — R635 Abnormal weight gain: Secondary | ICD-10-CM | POA: Insufficient documentation

## 2012-11-10 DIAGNOSIS — R21 Rash and other nonspecific skin eruption: Secondary | ICD-10-CM | POA: Insufficient documentation

## 2012-11-10 DIAGNOSIS — Z8659 Personal history of other mental and behavioral disorders: Secondary | ICD-10-CM | POA: Insufficient documentation

## 2012-11-10 DIAGNOSIS — Z9119 Patient's noncompliance with other medical treatment and regimen: Secondary | ICD-10-CM | POA: Insufficient documentation

## 2012-11-10 DIAGNOSIS — Z79899 Other long term (current) drug therapy: Secondary | ICD-10-CM | POA: Insufficient documentation

## 2012-11-10 DIAGNOSIS — J3489 Other specified disorders of nose and nasal sinuses: Secondary | ICD-10-CM | POA: Insufficient documentation

## 2012-11-10 DIAGNOSIS — F411 Generalized anxiety disorder: Secondary | ICD-10-CM | POA: Insufficient documentation

## 2012-11-10 DIAGNOSIS — Z91199 Patient's noncompliance with other medical treatment and regimen due to unspecified reason: Secondary | ICD-10-CM | POA: Insufficient documentation

## 2012-11-10 DIAGNOSIS — R05 Cough: Secondary | ICD-10-CM | POA: Insufficient documentation

## 2012-11-10 DIAGNOSIS — R29898 Other symptoms and signs involving the musculoskeletal system: Secondary | ICD-10-CM | POA: Insufficient documentation

## 2012-11-10 DIAGNOSIS — F419 Anxiety disorder, unspecified: Secondary | ICD-10-CM

## 2012-11-10 DIAGNOSIS — R109 Unspecified abdominal pain: Secondary | ICD-10-CM | POA: Insufficient documentation

## 2012-11-10 DIAGNOSIS — I10 Essential (primary) hypertension: Secondary | ICD-10-CM | POA: Insufficient documentation

## 2012-11-10 DIAGNOSIS — Z8679 Personal history of other diseases of the circulatory system: Secondary | ICD-10-CM | POA: Insufficient documentation

## 2012-11-10 DIAGNOSIS — R059 Cough, unspecified: Secondary | ICD-10-CM | POA: Insufficient documentation

## 2012-11-10 LAB — BASIC METABOLIC PANEL
BUN: 25 mg/dL — ABNORMAL HIGH (ref 6–23)
Chloride: 102 mEq/L (ref 96–112)
Creatinine, Ser: 1.37 mg/dL — ABNORMAL HIGH (ref 0.50–1.35)
GFR calc Af Amer: 63 mL/min — ABNORMAL LOW (ref >90)
Glucose, Bld: 99 mg/dL (ref 70–99)
Potassium: 3.5 mEq/L (ref 3.5–5.1)

## 2012-11-10 LAB — CBC WITH DIFFERENTIAL/PLATELET
Basophils Relative: 0 % (ref 0–1)
HCT: 45.8 % (ref 39.0–52.0)
Hemoglobin: 15.6 g/dL (ref 13.0–17.0)
Lymphs Abs: 1.7 10*3/uL (ref 0.7–4.0)
MCH: 28.2 pg (ref 26.0–34.0)
MCHC: 34.1 g/dL (ref 30.0–36.0)
Monocytes Absolute: 0.8 10*3/uL (ref 0.1–1.0)
Monocytes Relative: 10 % (ref 3–12)
Neutro Abs: 6.2 10*3/uL (ref 1.7–7.7)
Neutrophils Relative %: 70 % (ref 43–77)
RBC: 5.54 MIL/uL (ref 4.22–5.81)

## 2012-11-10 LAB — HEPATIC FUNCTION PANEL
AST: 22 U/L (ref 0–37)
Albumin: 3.8 g/dL (ref 3.5–5.2)
Alkaline Phosphatase: 58 U/L (ref 39–117)
Total Bilirubin: 0.5 mg/dL (ref 0.3–1.2)
Total Protein: 6.8 g/dL (ref 6.0–8.3)

## 2012-11-10 LAB — LIPASE, BLOOD: Lipase: 59 U/L (ref 11–59)

## 2012-11-10 MED ORDER — LORAZEPAM 2 MG/ML IJ SOLN
1.0000 mg | Freq: Once | INTRAMUSCULAR | Status: AC
  Administered 2012-11-10: 1 mg via INTRAVENOUS
  Filled 2012-11-10: qty 1

## 2012-11-10 MED ORDER — LORAZEPAM 1 MG PO TABS: 1.0000 mg | ORAL_TABLET | Freq: Three times a day (TID) | ORAL | Status: DC | PRN

## 2012-11-10 MED ORDER — IOHEXOL 350 MG/ML SOLN
120.0000 mL | Freq: Once | INTRAVENOUS | Status: AC | PRN
  Administered 2012-11-10: 120 mL via INTRAVENOUS

## 2012-11-10 NOTE — ED Notes (Signed)
Went to patient room to do EKG. Patient refused EKG at this time stating "I am in too much pain, you need to come back."

## 2012-11-10 NOTE — ED Provider Notes (Signed)
History  This chart was scribed for Todd Lennert, MD by Todd Page, ED Scribe. This patient was seen in room APA17/APA17 and the patient's care was started at 21:16.   CSN: [id number]  Arrival date & time 11/10/12  2019   First MD Initiated Contact with Patient 11/10/12 2116      Chief Complaint  Patient presents with  . Panic Attack    (Consider location/radiation/quality/duration/timing/severity/associated sxs/prior Treatment) Todd Page is a 62 y.o. male who presents to the Emergency Department complaining of an anxiety attack since being evicted from his house this afternoon. Pt report a h/o anxiety attacks, about every several months, but he has never been to the hospital for it. Pt reports some associated difficulty breathing and pain in his feet and legs, which he says is typical for one of his anxiety episodes. Pt also reports he has been a little sick lately with a nonproductive cough, rash, and rhinorrhea. He denies any associated chest pain. Patient is a 62 y.o. male presenting with anxiety. The history is provided by the patient. No language interpreter was used.  Anxiety This is a new problem. The current episode started 3 to 5 hours ago. The problem occurs constantly. The problem has been gradually improving. Pertinent negatives include no chest pain, no abdominal pain and no headaches. The symptoms are aggravated by stress. The symptoms are relieved by medications. He has tried nothing for the symptoms. The treatment provided no relief.  Pt has no PCP.  Past Medical History  Diagnosis Date  . Hypertension   . Schizoaffective disorder     With depression  . Atrial fibrillation 06/2012    first identified in 06/2012; normal TSH  . Overweight   . Gout   . Systolic dysfunction 06/2012.    EF 45%  . Noncompliance 09/21/2012    Past Surgical History  Procedure Date  . Cardiac surgery   . Colonoscopy 2006    normal screening examination    Family History  Problem  Relation Age of Onset  . Hypertension Mother   . Liver disease Father     History  Substance Use Topics  . Smoking status: Never Smoker   . Smokeless tobacco: Not on file  . Alcohol Use: Yes      Review of Systems  Constitutional: Negative for fever.  HENT: Positive for rhinorrhea. Negative for congestion, sinus pressure and ear discharge.   Eyes: Negative for discharge.  Respiratory: Positive for cough.        Difficulty breathing  Cardiovascular: Negative for chest pain.  Gastrointestinal: Negative for abdominal pain and diarrhea.  Genitourinary: Negative for frequency and hematuria.  Musculoskeletal: Positive for joint swelling. Negative for back pain.       Pain in feet and legs bilaterally.  Skin: Positive for rash.  Neurological: Negative for seizures and headaches.  Hematological: Negative.   Psychiatric/Behavioral: Negative for hallucinations. The patient is nervous/anxious.   All other systems reviewed and are negative.    Allergies  Review of patient's allergies indicates no known allergies.  Home Medications   Current Outpatient Rx  Name  Route  Sig  Dispense  Refill  . DILTIAZEM HCL ER COATED BEADS 240 MG PO TB24   Oral   Take 240 mg by mouth at bedtime. For treatment of your elevated heart rate and high blood pressure.         Marland Kitchen LISINOPRIL 5 MG PO TABS   Oral   Take 5 mg by mouth  at bedtime. For treatment of your high blood pressure and your heart.           Triage Vitals: BP 134/95  Pulse 92  Temp 97.6 F (36.4 C) (Oral)  Resp 20  SpO2 99%  Physical Exam  Nursing note and vitals reviewed. Constitutional: He is oriented to person, place, and time. He appears well-developed.  HENT:  Head: Normocephalic and atraumatic.  Eyes: Conjunctivae normal and EOM are normal. No scleral icterus.  Neck: Neck supple. No thyromegaly present.  Cardiovascular: Normal rate and regular rhythm.  Exam reveals no gallop and no friction rub.   No murmur  heard. Pulmonary/Chest: No stridor. He has no wheezes. He has no rales. He exhibits no tenderness.  Abdominal: He exhibits no distension. There is no tenderness. There is no rebound.  Musculoskeletal: Normal range of motion. He exhibits edema.       1+ edema in ankles.  Lymphadenopathy:    He has no cervical adenopathy.  Neurological: He is oriented to person, place, and time. Coordination normal.  Skin: No rash noted. No erythema.  Psychiatric: His behavior is normal. His mood appears anxious.    ED Course  Procedures (including critical care time) DIAGNOSTIC STUDIES: Oxygen Saturation is 99% on room air, normal by my interpretation.    COORDINATION OF CARE: 21:20--I evaluated the patient and we discussed a treatment plan including anxiety medication and tests to which the pt agreed.   21:38--I rechecked the pt who refused his EKG because his stomach started cramping. He exhibits epigastric tenderness.   Labs Reviewed - No data to display No results found.   No diagnosis found.    MDM  Pt had no pain at discharge    The chart was scribed for me under my direct supervision.  I personally performed the history, physical, and medical decision making and all procedures in the evaluation of this patient.Todd Lennert, MD 11/11/12 [phone]

## 2012-11-10 NOTE — ED Notes (Signed)
Patient was being evicted from his home and law enforcement had to be called.  Patient began having a panic attack and requested to come to hospital.  A&O; skin w/d.  Respirations even and unlabored; able to speak in complete sentences without difficulty.

## 2012-11-22 ENCOUNTER — Encounter (HOSPITAL_COMMUNITY): Payer: Self-pay | Admitting: *Deleted

## 2012-11-22 ENCOUNTER — Emergency Department (HOSPITAL_COMMUNITY): Payer: Medicare Other

## 2012-11-22 ENCOUNTER — Emergency Department (HOSPITAL_COMMUNITY)
Admission: EM | Admit: 2012-11-22 | Discharge: 2012-11-22 | Disposition: A | Discharge status: Home / Self Care | Payer: Medicare Other | Attending: Emergency Medicine | Admitting: Emergency Medicine

## 2012-11-22 DIAGNOSIS — Z862 Personal history of diseases of the blood and blood-forming organs and certain disorders involving the immune mechanism: Secondary | ICD-10-CM | POA: Insufficient documentation

## 2012-11-22 DIAGNOSIS — M25522 Pain in left elbow: Secondary | ICD-10-CM

## 2012-11-22 DIAGNOSIS — Z8639 Personal history of other endocrine, nutritional and metabolic disease: Secondary | ICD-10-CM | POA: Insufficient documentation

## 2012-11-22 DIAGNOSIS — Z9119 Patient's noncompliance with other medical treatment and regimen: Secondary | ICD-10-CM | POA: Insufficient documentation

## 2012-11-22 DIAGNOSIS — I10 Essential (primary) hypertension: Secondary | ICD-10-CM | POA: Insufficient documentation

## 2012-11-22 DIAGNOSIS — Z8679 Personal history of other diseases of the circulatory system: Secondary | ICD-10-CM | POA: Insufficient documentation

## 2012-11-22 DIAGNOSIS — Z79899 Other long term (current) drug therapy: Secondary | ICD-10-CM | POA: Insufficient documentation

## 2012-11-22 DIAGNOSIS — Z91199 Patient's noncompliance with other medical treatment and regimen due to unspecified reason: Secondary | ICD-10-CM | POA: Insufficient documentation

## 2012-11-22 DIAGNOSIS — Z9889 Other specified postprocedural states: Secondary | ICD-10-CM | POA: Insufficient documentation

## 2012-11-22 DIAGNOSIS — I4891 Unspecified atrial fibrillation: Secondary | ICD-10-CM | POA: Insufficient documentation

## 2012-11-22 DIAGNOSIS — M25529 Pain in unspecified elbow: Secondary | ICD-10-CM | POA: Insufficient documentation

## 2012-11-22 DIAGNOSIS — Z8659 Personal history of other mental and behavioral disorders: Secondary | ICD-10-CM | POA: Insufficient documentation

## 2012-11-22 LAB — COMPREHENSIVE METABOLIC PANEL
AST: 16 U/L (ref 0–37)
BUN: 16 mg/dL (ref 6–23)
CO2: 27 mEq/L (ref 19–32)
Calcium: 9.4 mg/dL (ref 8.4–10.5)
Chloride: 98 mEq/L (ref 96–112)
Creatinine, Ser: 1.39 mg/dL — ABNORMAL HIGH (ref 0.50–1.35)
GFR calc Af Amer: 62 mL/min — ABNORMAL LOW (ref >90)
GFR calc non Af Amer: 54 mL/min — ABNORMAL LOW (ref >90)
Glucose, Bld: 106 mg/dL — ABNORMAL HIGH (ref 70–99)
Total Bilirubin: 0.9 mg/dL (ref 0.3–1.2)

## 2012-11-22 LAB — CBC WITH DIFFERENTIAL/PLATELET
Basophils Absolute: 0 10*3/uL (ref 0.0–0.1)
Eosinophils Relative: 0 % (ref 0–5)
HCT: 46.3 % (ref 39.0–52.0)
Hemoglobin: 15.9 g/dL (ref 13.0–17.0)
Lymphocytes Relative: 14 % (ref 12–46)
MCV: 81.7 fL (ref 78.0–100.0)
Monocytes Absolute: 1 10*3/uL (ref 0.1–1.0)
Monocytes Relative: 10 % (ref 3–12)
Neutro Abs: 7.1 10*3/uL (ref 1.7–7.7)
RDW: 14.2 % (ref 11.5–15.5)
WBC: 9.5 10*3/uL (ref 4.0–10.5)

## 2012-11-22 MED ORDER — SODIUM CHLORIDE 0.9 % IV BOLUS (SEPSIS)
1000.0000 mL | Freq: Once | INTRAVENOUS | Status: AC
  Administered 2012-11-22: 1000 mL via INTRAVENOUS

## 2012-11-22 MED ORDER — LIDOCAINE HCL (PF) 1 % IJ SOLN
5.0000 mL | Freq: Once | INTRAMUSCULAR | Status: AC
  Administered 2012-11-22: 5 mL via INTRADERMAL

## 2012-11-22 MED ORDER — TRAMADOL HCL 50 MG PO TABS
ORAL_TABLET | ORAL | Status: AC
  Filled 2012-11-22: qty 1

## 2012-11-22 MED ORDER — LORAZEPAM 2 MG/ML IJ SOLN
1.0000 mg | Freq: Once | INTRAMUSCULAR | Status: AC
  Administered 2012-11-22: 1 mg via INTRAVENOUS
  Filled 2012-11-22: qty 1

## 2012-11-22 MED ORDER — DILTIAZEM HCL ER COATED BEADS 240 MG PO CP24
240.0000 mg | ORAL_CAPSULE | Freq: Every day | ORAL | Status: DC
  Administered 2012-11-22: 240 mg via ORAL
  Filled 2012-11-22 (×2): qty 1

## 2012-11-22 MED ORDER — TIOTROPIUM BROMIDE MONOHYDRATE 18 MCG IN CAPS
ORAL_CAPSULE | RESPIRATORY_TRACT | Status: AC
  Filled 2012-11-22: qty 5

## 2012-11-22 MED ORDER — LISINOPRIL 5 MG PO TABS
5.0000 mg | ORAL_TABLET | Freq: Once | ORAL | Status: AC
  Administered 2012-11-22: 5 mg via ORAL
  Filled 2012-11-22: qty 1

## 2012-11-22 MED ORDER — HYDROMORPHONE HCL PF 1 MG/ML IJ SOLN
0.5000 mg | Freq: Once | INTRAMUSCULAR | Status: AC
  Administered 2012-11-22: 0.5 mg via INTRAVENOUS
  Filled 2012-11-22: qty 1

## 2012-11-22 MED ORDER — LISINOPRIL 5 MG PO TABS: 5.0000 mg | ORAL_TABLET | Freq: Every day | ORAL | Status: DC

## 2012-11-22 MED ORDER — TRAMADOL HCL 50 MG PO TABS
100.0000 mg | ORAL_TABLET | Freq: Once | ORAL | Status: AC
  Administered 2012-11-22: 100 mg via ORAL
  Filled 2012-11-22: qty 1

## 2012-11-22 MED ORDER — DILTIAZEM HCL ER COATED BEADS 240 MG PO CP24
ORAL_CAPSULE | ORAL | Status: AC
  Filled 2012-11-22: qty 1

## 2012-11-22 MED ORDER — LORAZEPAM 1 MG PO TABS: 1.0000 mg | ORAL_TABLET | Freq: Three times a day (TID) | ORAL | Status: DC | PRN

## 2012-11-22 MED ORDER — TRAMADOL HCL 50 MG PO TABS: 50.0000 mg | ORAL_TABLET | Freq: Four times a day (QID) | ORAL | Status: DC | PRN

## 2012-11-22 MED ORDER — BUDESONIDE-FORMOTEROL FUMARATE 160-4.5 MCG/ACT IN AERO
INHALATION_SPRAY | RESPIRATORY_TRACT | Status: AC
  Filled 2012-11-22: qty 6

## 2012-11-22 MED ORDER — DILTIAZEM HCL ER COATED BEADS 240 MG PO TB24: 240.0000 mg | ORAL_TABLET | Freq: Every day | ORAL | Status: DC

## 2012-11-22 NOTE — ED Notes (Signed)
Pt c/o left arm pain and swelling that started today, pain is worse with movement, denies any injury.

## 2012-11-22 NOTE — ED Notes (Signed)
Pt requested urinal, however, urinated in bed.  Full linen and gown change provided.

## 2012-11-22 NOTE — ED Provider Notes (Signed)
History  This chart was scribed for Todd Munch, MD by Ardeen Jourdain, ED Scribe. This patient was seen in room APA01/APA01 and the patient's care was started at 1559.  CSN: [id number]  Arrival date & time 11/22/12  1521   First MD Initiated Contact with Patient 11/22/12 1559      Chief Complaint  Patient presents with  . Arm Pain     The history is provided by the patient. No language interpreter was used.    QUAN CYBULSKI is a 62 y.o. male who presents to the Emergency Department complaining of left arm pain from his elbow down to his hand. He denies any SOB, CP, abdominal pain, fever, nausea and emesis as associated symptoms. He denies any injury to the area and any recent altercations. He states he has not taken any medication for the pain. He states he is not taking medication currently because he is in the custody of the sheriff's office. He states he has a h/o gout in his feet.   Past Medical History  Diagnosis Date  . Hypertension   . Schizoaffective disorder     With depression  . Atrial fibrillation 06/2012    first identified in 06/2012; normal TSH  . Overweight   . Gout   . Systolic dysfunction 06/2012.    EF 45%  . Noncompliance 09/21/2012    Past Surgical History  Procedure Date  . Cardiac surgery   . Colonoscopy 2006    normal screening examination    Family History  Problem Relation Age of Onset  . Hypertension Mother   . Liver disease Father     History  Substance Use Topics  . Smoking status: Never Smoker   . Smokeless tobacco: Not on file  . Alcohol Use: Yes      Review of Systems  Constitutional:       Per HPI, otherwise negative  HENT:       Per HPI, otherwise negative  Eyes: Negative.   Respiratory:       Per HPI, otherwise negative  Cardiovascular:       Per HPI, otherwise negative  Gastrointestinal: Negative for vomiting.  Genitourinary: Negative.   Musculoskeletal:       Per HPI, otherwise negative  Skin: Negative.     Neurological: Negative for syncope.    Allergies  Review of patient's allergies indicates no known allergies.  Home Medications   Current Outpatient Rx  Name  Route  Sig  Dispense  Refill  . DILTIAZEM HCL ER COATED BEADS 240 MG PO TB24   Oral   Take 240 mg by mouth at bedtime. For treatment of your elevated heart rate and high blood pressure.         Marland Kitchen LISINOPRIL 5 MG PO TABS   Oral   Take 5 mg by mouth at bedtime. For treatment of your high blood pressure and your heart.         Marland Kitchen LORAZEPAM 1 MG PO TABS   Oral   Take 1 tablet (1 mg total) by mouth 3 (three) times daily as needed for anxiety.   20 tablet   0     Triage Vitals: BP 181/104  Pulse 92  Temp 97.4 F (36.3 C) (Oral)  Resp 22  Ht 6' (1.829 m)  Wt 310 lb (140.615 kg)  BMI 42.04 kg/m2  SpO2 98%  Physical Exam  Nursing note and vitals reviewed. Constitutional: He is oriented to person, place, and time. He appears  well-developed and well-nourished. No distress.  HENT:  Head: Normocephalic and atraumatic.  Eyes: Conjunctivae normal and EOM are normal. Pupils are equal, round, and reactive to light.  Neck: Normal range of motion.  Cardiovascular: Normal rate, regular rhythm, normal heart sounds and intact distal pulses.  Exam reveals no gallop and no friction rub.   No murmur heard. Pulmonary/Chest: Effort normal and breath sounds normal. No stridor. No respiratory distress. He has no wheezes. He has no rales. He exhibits no tenderness.  Abdominal: Soft. Bowel sounds are normal. He exhibits no distension and no mass. There is no tenderness. There is no rebound and no guarding.  Musculoskeletal: He exhibits no edema.       Soft compartments,good sensation through R/M/U distrubution, good capillary refill, swelling around proximal forearm, elbow is warm to touch  Neurological: He is alert and oriented to person, place, and time.  Skin: Skin is warm and dry. No rash noted.  Psychiatric: He has a normal mood  and affect.    ED Course  Procedures (including critical care time)  DIAGNOSTIC STUDIES: Oxygen Saturation is 98% on room air, normal by my interpretation.    COORDINATION OF CARE:  5:05 PM: Discussed treatment plan which includes IV fluids, CBC, CMP and an EKG  with pt at bedside and pt agreed to plan.   5:15 PM: Medication Orders: LORazepam (ATIVAN) injection 1 mg Once, sodium chloride 0.9 % bolus 1,000 mL Once    Cardiac 95 A. Fib abnormal Pulse ox 96% room air normal   Date: 11/22/2012  Rate: 97  Rhythm: atrial fibrillation  QRS Axis: normal  Intervals: normal  ST/T Wave abnormalities: nonspecific T wave changes  Conduction Disutrbances:none  Narrative Interpretation:   Old EKG Reviewed: unchanged ABNORMAL    Labs Reviewed - No data to display No results found.   No diagnosis found.  2000 patient remains tachycardic, hypertensive.  Meds ordered.   2120: Patient sleeping, blood pressure is essentially better, her rate roughly the same.  His received his home medication. MDM   I personally performed the services described in this documentation, which was scribed in my presence. The recorded information has been reviewed and is accurate.   This patient with multiple medical problems, including hypertension, chronic A. Fib now presents with left elbow pain.  Notably, the patient is presenting from police custody, but during his emergency department stay he was released.  On arrival the patient's hypertensive, tachycardic, but he describes not taking any medication for at least 2 weeks.  He denies any chest pain, dyspnea that is new, though he appears generally uncomfortable, he denies acute changes.  The patient's elbow is warm, tender to palpation.  Given his history of gout, this is a consideration.  Additionally, the x-ray suggests bursitis.  Absent fever,leukocytosis, there is little suspicion for septic arthritis.  The patient improved substantially, was sleeping,  and was discharged in stable condition.  Todd Munch, MD 11/22/12 2139

## 2012-11-22 NOTE — ED Notes (Signed)
When asked about any blood pressure medication, pt states that he takes his at night because it will make him sleepy,.

## 2012-12-04 ENCOUNTER — Ambulatory Visit (HOSPITAL_COMMUNITY): Payer: Medicare Other | Admitting: Psychology

## 2012-12-05 ENCOUNTER — Encounter: Payer: Self-pay | Admitting: Cardiology

## 2012-12-05 ENCOUNTER — Encounter: Payer: Medicare Other | Admitting: Cardiology

## 2012-12-17 ENCOUNTER — Encounter: Payer: Self-pay | Admitting: *Deleted

## 2012-12-18 ENCOUNTER — Encounter: Payer: Medicare Other | Admitting: Cardiovascular Disease

## 2012-12-24 ENCOUNTER — Encounter (HOSPITAL_COMMUNITY): Payer: Self-pay

## 2012-12-24 ENCOUNTER — Emergency Department (HOSPITAL_COMMUNITY): Payer: Medicare Other

## 2012-12-24 ENCOUNTER — Inpatient Hospital Stay (HOSPITAL_COMMUNITY)
Admission: EM | Admit: 2012-12-24 | Discharge: 2012-12-30 | DRG: 308 | Disposition: A | Discharge status: Home / Self Care | Payer: Medicare Other | Attending: Internal Medicine | Admitting: Internal Medicine

## 2012-12-24 DIAGNOSIS — E876 Hypokalemia: Secondary | ICD-10-CM | POA: Diagnosis present

## 2012-12-24 DIAGNOSIS — Z7982 Long term (current) use of aspirin: Secondary | ICD-10-CM

## 2012-12-24 DIAGNOSIS — Z6841 Body Mass Index (BMI) 40.0 and over, adult: Secondary | ICD-10-CM

## 2012-12-24 DIAGNOSIS — I4891 Unspecified atrial fibrillation: Principal | ICD-10-CM | POA: Diagnosis present

## 2012-12-24 DIAGNOSIS — I509 Heart failure, unspecified: Secondary | ICD-10-CM | POA: Diagnosis present

## 2012-12-24 DIAGNOSIS — M79604 Pain in right leg: Secondary | ICD-10-CM | POA: Diagnosis present

## 2012-12-24 DIAGNOSIS — F259 Schizoaffective disorder, unspecified: Secondary | ICD-10-CM | POA: Diagnosis present

## 2012-12-24 DIAGNOSIS — M79609 Pain in unspecified limb: Secondary | ICD-10-CM

## 2012-12-24 DIAGNOSIS — R0603 Acute respiratory distress: Secondary | ICD-10-CM

## 2012-12-24 DIAGNOSIS — I5023 Acute on chronic systolic (congestive) heart failure: Secondary | ICD-10-CM | POA: Diagnosis present

## 2012-12-24 DIAGNOSIS — R0600 Dyspnea, unspecified: Secondary | ICD-10-CM | POA: Diagnosis present

## 2012-12-24 DIAGNOSIS — Z79899 Other long term (current) drug therapy: Secondary | ICD-10-CM

## 2012-12-24 DIAGNOSIS — E669 Obesity, unspecified: Secondary | ICD-10-CM | POA: Diagnosis present

## 2012-12-24 DIAGNOSIS — R0609 Other forms of dyspnea: Secondary | ICD-10-CM

## 2012-12-24 DIAGNOSIS — M79605 Pain in left leg: Secondary | ICD-10-CM | POA: Diagnosis present

## 2012-12-24 DIAGNOSIS — J209 Acute bronchitis, unspecified: Secondary | ICD-10-CM | POA: Diagnosis present

## 2012-12-24 DIAGNOSIS — I10 Essential (primary) hypertension: Secondary | ICD-10-CM | POA: Diagnosis present

## 2012-12-24 DIAGNOSIS — Z91199 Patient's noncompliance with other medical treatment and regimen due to unspecified reason: Secondary | ICD-10-CM

## 2012-12-24 DIAGNOSIS — Z9119 Patient's noncompliance with other medical treatment and regimen: Secondary | ICD-10-CM

## 2012-12-24 LAB — CBC WITH DIFFERENTIAL/PLATELET
Eosinophils Relative: 1 % (ref 0–5)
HCT: 44.6 % (ref 39.0–52.0)
Hemoglobin: 14.9 g/dL (ref 13.0–17.0)
Lymphocytes Relative: 20 % (ref 12–46)
MCHC: 33.4 g/dL (ref 30.0–36.0)
MCV: 81.1 fL (ref 78.0–100.0)
Monocytes Absolute: 0.6 10*3/uL (ref 0.1–1.0)
Monocytes Relative: 11 % (ref 3–12)
Neutro Abs: 3.5 10*3/uL (ref 1.7–7.7)
WBC: 5.1 10*3/uL (ref 4.0–10.5)

## 2012-12-24 LAB — BASIC METABOLIC PANEL
BUN: 10 mg/dL (ref 6–23)
CO2: 29 mEq/L (ref 19–32)
Chloride: 99 mEq/L (ref 96–112)
Creatinine, Ser: 1.19 mg/dL (ref 0.50–1.35)

## 2012-12-24 LAB — SEDIMENTATION RATE: Sed Rate: 13 mm/hr (ref 0–16)

## 2012-12-24 LAB — MRSA PCR SCREENING: MRSA by PCR: NEGATIVE

## 2012-12-24 LAB — URIC ACID: Uric Acid, Serum: 10.5 mg/dL — ABNORMAL HIGH (ref 4.0–7.8)

## 2012-12-24 MED ORDER — PREDNISONE 50 MG PO TABS
60.0000 mg | ORAL_TABLET | Freq: Once | ORAL | Status: AC
  Administered 2012-12-24: 60 mg via ORAL
  Filled 2012-12-24: qty 1

## 2012-12-24 MED ORDER — ALPRAZOLAM 0.25 MG PO TABS
0.2500 mg | ORAL_TABLET | Freq: Three times a day (TID) | ORAL | Status: DC | PRN
  Administered 2012-12-24 – 2012-12-28 (×6): 0.25 mg via ORAL
  Filled 2012-12-24 (×6): qty 1

## 2012-12-24 MED ORDER — ENOXAPARIN SODIUM 40 MG/0.4ML ~~LOC~~ SOLN
40.0000 mg | SUBCUTANEOUS | Status: DC
  Administered 2012-12-24 – 2012-12-29 (×6): 40 mg via SUBCUTANEOUS
  Filled 2012-12-24 (×5): qty 0.4

## 2012-12-24 MED ORDER — LEVALBUTEROL HCL 0.63 MG/3ML IN NEBU
0.6300 mg | INHALATION_SOLUTION | Freq: Four times a day (QID) | RESPIRATORY_TRACT | Status: DC
  Administered 2012-12-24 – 2012-12-30 (×23): 0.63 mg via RESPIRATORY_TRACT
  Filled 2012-12-24 (×26): qty 3

## 2012-12-24 MED ORDER — POTASSIUM CHLORIDE CRYS ER 20 MEQ PO TBCR
40.0000 meq | EXTENDED_RELEASE_TABLET | Freq: Once | ORAL | Status: AC
  Administered 2012-12-24: 40 meq via ORAL
  Filled 2012-12-24: qty 2

## 2012-12-24 MED ORDER — HYDROCODONE-ACETAMINOPHEN 5-325 MG PO TABS
1.0000 | ORAL_TABLET | ORAL | Status: DC | PRN
  Administered 2012-12-24 – 2012-12-25 (×3): 1 via ORAL
  Filled 2012-12-24 (×4): qty 1

## 2012-12-24 MED ORDER — ALBUTEROL (5 MG/ML) CONTINUOUS INHALATION SOLN: 10.0000 mg | INHALATION_SOLUTION | RESPIRATORY_TRACT | Status: DC

## 2012-12-24 MED ORDER — ACETAMINOPHEN 650 MG RE SUPP: 650.0000 mg | Freq: Four times a day (QID) | RECTAL | Status: DC | PRN

## 2012-12-24 MED ORDER — ACETAMINOPHEN 325 MG PO TABS
650.0000 mg | ORAL_TABLET | Freq: Four times a day (QID) | ORAL | Status: DC | PRN
  Administered 2012-12-27 – 2012-12-30 (×4): 650 mg via ORAL
  Filled 2012-12-24 (×4): qty 2

## 2012-12-24 MED ORDER — SENNOSIDES-DOCUSATE SODIUM 8.6-50 MG PO TABS: 1.0000 | ORAL_TABLET | Freq: Every evening | ORAL | Status: DC | PRN

## 2012-12-24 MED ORDER — PREDNISONE 20 MG PO TABS
60.0000 mg | ORAL_TABLET | Freq: Every day | ORAL | Status: DC
  Administered 2012-12-25 – 2012-12-26 (×2): 60 mg via ORAL
  Filled 2012-12-24 (×2): qty 3

## 2012-12-24 MED ORDER — ALBUTEROL (5 MG/ML) CONTINUOUS INHALATION SOLN
10.0000 mg/h | INHALATION_SOLUTION | Freq: Once | RESPIRATORY_TRACT | Status: AC
  Administered 2012-12-24: 10 mg/h via RESPIRATORY_TRACT
  Filled 2012-12-24: qty 20

## 2012-12-24 MED ORDER — ALBUTEROL SULFATE (5 MG/ML) 0.5% IN NEBU
5.0000 mg | INHALATION_SOLUTION | Freq: Once | RESPIRATORY_TRACT | Status: AC
  Administered 2012-12-24: 5 mg via RESPIRATORY_TRACT
  Filled 2012-12-24: qty 1

## 2012-12-24 MED ORDER — ONDANSETRON HCL 4 MG/2ML IJ SOLN: 4.0000 mg | Freq: Four times a day (QID) | INTRAMUSCULAR | Status: DC | PRN

## 2012-12-24 MED ORDER — ALBUTEROL SULFATE (5 MG/ML) 0.5% IN NEBU: 2.5000 mg | INHALATION_SOLUTION | RESPIRATORY_TRACT | Status: DC

## 2012-12-24 MED ORDER — ASPIRIN EC 81 MG PO TBEC
81.0000 mg | DELAYED_RELEASE_TABLET | Freq: Every day | ORAL | Status: DC
  Administered 2012-12-24 – 2012-12-29 (×6): 81 mg via ORAL
  Filled 2012-12-24 (×6): qty 1

## 2012-12-24 MED ORDER — ALBUTEROL SULFATE (5 MG/ML) 0.5% IN NEBU: 2.5000 mg | INHALATION_SOLUTION | RESPIRATORY_TRACT | Status: DC | PRN

## 2012-12-24 MED ORDER — LISINOPRIL 10 MG PO TABS
5.0000 mg | ORAL_TABLET | Freq: Every day | ORAL | Status: DC
  Administered 2012-12-24 – 2012-12-25 (×2): 5 mg via ORAL
  Filled 2012-12-24 (×2): qty 1

## 2012-12-24 MED ORDER — ONDANSETRON HCL 4 MG PO TABS: 4.0000 mg | ORAL_TABLET | Freq: Four times a day (QID) | ORAL | Status: DC | PRN

## 2012-12-24 MED ORDER — IPRATROPIUM BROMIDE 0.02 % IN SOLN
0.5000 mg | Freq: Once | RESPIRATORY_TRACT | Status: AC
  Administered 2012-12-24: 0.5 mg via RESPIRATORY_TRACT
  Filled 2012-12-24: qty 2.5

## 2012-12-24 MED ORDER — ALUM & MAG HYDROXIDE-SIMETH 200-200-20 MG/5ML PO SUSP: 30.0000 mL | Freq: Four times a day (QID) | ORAL | Status: DC | PRN

## 2012-12-24 MED ORDER — DILTIAZEM HCL 100 MG IV SOLR
5.0000 mg/h | INTRAVENOUS | Status: DC
  Administered 2012-12-24: 15 mg/h via INTRAVENOUS
  Administered 2012-12-25: 10 mg/h via INTRAVENOUS
  Filled 2012-12-24: qty 100

## 2012-12-24 MED ORDER — IPRATROPIUM BROMIDE 0.02 % IN SOLN: 0.5000 mg | RESPIRATORY_TRACT | Status: DC

## 2012-12-24 MED ORDER — FUROSEMIDE 10 MG/ML IJ SOLN
60.0000 mg | Freq: Once | INTRAMUSCULAR | Status: AC
  Administered 2012-12-24: 60 mg via INTRAVENOUS
  Filled 2012-12-24: qty 6

## 2012-12-24 MED ORDER — DILTIAZEM HCL 100 MG IV SOLR
5.0000 mg/h | Freq: Once | INTRAVENOUS | Status: AC
  Administered 2012-12-24: 10 mg/h via INTRAVENOUS
  Filled 2012-12-24: qty 100

## 2012-12-24 MED ORDER — BISACODYL 5 MG PO TBEC: 5.0000 mg | DELAYED_RELEASE_TABLET | Freq: Every day | ORAL | Status: DC | PRN

## 2012-12-24 MED ORDER — DILTIAZEM HCL 25 MG/5ML IV SOLN
10.0000 mg | Freq: Once | INTRAVENOUS | Status: AC
  Administered 2012-12-24: 10 mg via INTRAVENOUS
  Filled 2012-12-24: qty 5

## 2012-12-24 MED ORDER — DILTIAZEM HCL 100 MG IV SOLR
5.0000 mg/h | Freq: Once | INTRAVENOUS | Status: AC
  Administered 2012-12-24: 15 mg/h via INTRAVENOUS
  Filled 2012-12-24: qty 100

## 2012-12-24 MED ORDER — FUROSEMIDE 10 MG/ML IJ SOLN
40.0000 mg | Freq: Two times a day (BID) | INTRAMUSCULAR | Status: DC
  Administered 2012-12-24 – 2012-12-27 (×6): 40 mg via INTRAVENOUS
  Filled 2012-12-24 (×6): qty 4

## 2012-12-24 NOTE — ED Notes (Signed)
edp in with pt now 

## 2012-12-24 NOTE — H&P (Signed)
[name] History and Physical  Todd Page ZOX:[id number] DOB: Sep 06, 1951 DOA: 12/24/2012  Referring physician:  PCP: No primary provider on file.  Specialists:   Chief Complaint: shortness of breath  HPI: Todd Page is a 62 y.o. male with past medical hx of afib not on anticoagulation due to non-compliance, HTN, systolic heart dysfunction, obesity, schizoaffective disorder who presents to [facility] ED with cc shortness of breath. Information obtained from patient. States he has been in usual state of health until this am when he awakened with a nose bleed and congestion. He quickly developed shortness of breath with exertion. Pt is not on nebs or oxygen at home. Associated symptoms include dizziness and dry cough and bilateral LE pain. He reports LE pain with weight bearing and believes it is gout.  He denies worsening LEE. He denies CP/palpitation, headache, nausea, vomiting, abdominal pain, dysuria, hematuria, diarrhea or melena. Denies fever, chills or recent sick contacts. Reports not taking any of his medicines "in quite some time" because they were "lost" and could not replace due to finances. Denies history of asthma, COPD.  Symptoms came on suddenly, have persisted and characterized as severe. Work up in ED yields afib with RVR , HTN. We are asked to admit   Review of Systems: The patient denies anorexia, fever, weight loss,, vision loss, decreased hearing, hoarseness, chest pain, syncope, balance deficits, hemoptysis, abdominal pain, melena, hematochezia, severe indigestion/heartburn, hematuria, incontinence, genital sores, muscle weakness, suspicious skin lesions, transient blindness, difficulty walking, depression, unusual weight change, abnormal bleeding, enlarged lymph nodes, angioedema, and breast masses.    Past Medical History  Diagnosis Date  . Hypertension     minimal coronary atherosclerosis by CT  . Schizoaffective disorder     With depression  . Atrial fibrillation  06/2012    onset 06/2012; normal TSH; EF-45%  . Overweight   . Gout   . Noncompliance 09/21/2012  . Cholelithiasis     incidental finding on CT in 2013   Past Surgical History  Procedure Laterality Date  . Cardiac surgery      ? history accurate; no apparent incisions  . Colonoscopy  2006    normal screening examination   Social History:  reports that he has never smoked. He does not have any smokeless tobacco history on file. He reports that  drinks alcohol. He reports that he does not use illicit drugs. Pt lives with mother who is currently in nursing home. Pt is disabled but independent with ADL's  No Known Allergies  Family History  Problem Relation Age of Onset  . Hypertension Mother   . Liver disease Father      Prior to Admission medications   Medication Sig Start Date End Date Taking? Authorizing Provider  ALPRAZolam (XANAX) 0.25 MG tablet Take 0.25 mg by mouth 3 (three) times daily as needed for sleep.   Yes Historical Provider, MD  aspirin 81 MG tablet Take 81 mg by mouth daily.   Yes Historical Provider, MD  diltiazem (CARDIZEM LA) 240 MG 24 hr tablet Take 1 tablet (240 mg total) by mouth at bedtime. For treatment of your elevated heart rate and high blood pressure. 11/22/12  Yes Gerhard Munch, MD  lisinopril (PRINIVIL,ZESTRIL) 5 MG tablet Take 1 tablet (5 mg total) by mouth at bedtime. For treatment of your high blood pressure and your heart. 11/22/12  Yes Gerhard Munch, MD   Physical Exam: Filed Vitals:   12/24/12 1049 12/24/12 1049 12/24/12 1140 12/24/12 1214  BP:  163/110  170/94  Pulse: 116 126    Temp:      TempSrc:      Resp: 26 26  28   Height:      Weight:      SpO2: 93%  95% 100%     General:  Obese NAD  Eyes: PERRL EOMI no scleral icterus  ENT: ears clear, no nasal drainage, mucus membranes mouth pink slightly dry  Neck: supple full rom no JVD no Lymphadenopaty  Cardiovascular: irregular No MGR trace-1+LEE PPP mild erythema bilateral LE r>l.  No tenderness/warmth  Respiratory: increased work of breathing. BS with diffuse wheezes with underlying rhonchi.   Abdomen: obese, soft +BS non-tender to palpation  Skin: no rash lesion. Healed wound right shin, bilateral LEE erythema without warmth, tenderness  Musculoskeletal: MAE no joint swelling. Non-tender  Psychiatric: cooperative, calm  Neurologic: cranial nerve II-XII intact speech clear facial symmetry  Labs on Admission:  Basic Metabolic Panel:  Recent Labs Lab 12/24/12 1009  NA 135  K 3.2*  CL 99  CO2 29  GLUCOSE 98  BUN 10  CREATININE 1.19  CALCIUM 9.2   Liver Function Tests: No results found for this basename: AST, ALT, ALKPHOS, BILITOT, PROT, ALBUMIN,  in the last 168 hours No results found for this basename: LIPASE, AMYLASE,  in the last 168 hours No results found for this basename: AMMONIA,  in the last 168 hours CBC:  Recent Labs Lab 12/24/12 1009  WBC 5.1  NEUTROABS 3.5  HGB 14.9  HCT 44.6  MCV 81.1  PLT 191   Cardiac Enzymes: No results found for this basename: CKTOTAL, CKMB, CKMBINDEX, TROPONINI,  in the last 168 hours  BNP (last 3 results)  Recent Labs  07/26/12 0827 09/20/12 1808 11/10/12 2123  PROBNP 272.5* 665.0* 390.4*   CBG: No results found for this basename: GLUCAP,  in the last 168 hours  Radiological Exams on Admission: Dg Chest 2 View  12/24/2012  *RADIOLOGY REPORT*  Clinical Data: Shortness of breath and dizziness.  CHEST - 2 VIEW  Comparison: CT abdomen and pelvis 11/10/2012.  Single view of the chest and CT chest 09/20/2012.  Findings: There is cardiomegaly without pulmonary edema. Subsegmental atelectasis is seen in the lung bases.  No pneumothorax or pleural fluid.  IMPRESSION: Cardiomegaly without acute disease.   Original Report Authenticated By: Holley Dexter, M.D.     EKG: Independently reviewed. afib with RVR  Assessment/Plan Principal Problem:   Atrial fibrillation with rapid ventricular response: hx of  same. Likely related to non-compliance with medication. Will admit to SD. Cardizem bolus 10mg  given and Cardizem drip started in ED. Not on anticoagulation due to non-compliance. BNP elevated. Will check TSH and CMET. Will cycle troponin.  Chest xray without pulmonary edema. Mild cardiomegaly.  Active Problems: Dyspnea: likely related to #1 in setting of possible volume overload in pt with hx systolic dysfunction and medication non-compliance. Echo in 9/13 yields EF 45% with severe hypokinesis and moderate LVH. ProBNP 1207.  Will will give IV lasix continue nebs and steroids. Chest xray yields cardiomegaly without acute disease. Sats >90% on continuous nebs. Intake and output. Daily weight.  Hx Systolic dysfunction: echo 07/11/13 45%. Pro BNP 1207. Will give one time dose lasix. See #2.   Malignant hypertension: see #1 treatment. Will also resume pt. Home lisinopril. Monitor closely. I    Obesity: BMI 42.1. Nutritional consult    Schizoaffective disorder: appears at baseline.          Bilateral leg  pain: hx of gout. Will check sed rate and uric acid level.        Code Status: full Family Communication:  Disposition Plan: home when ready  Time spent: 65 minutes  [facility] M [name]   If 7PM-7AM, please contact night-coverage [email or web address] Password [email or web address] 12/24/2012, 12:34 PM  Attending note:    Patient seen and examined.  Above note reviewed.   His shortness of breath is likely related to his rapid atrial fibrillation and an element of acute on chronic systolic CHF.  Patient will be continued on IV lasix.  He does not appear to have a pneumonia.  Will check D dimer. Continue nebs and steroids for now.  He has joint pain and elevated uric acid.  Prednisone will help treat element of gout attack.  Biggest issue with this patient will be compliance. Continue IV cardizem for rate control. He is not a candidate for anticoagulation due to poor compliance.

## 2012-12-24 NOTE — ED Notes (Signed)
Patient states he is feeling dizzy at this time. RN made aware.

## 2012-12-24 NOTE — ED Provider Notes (Signed)
History    This chart was scribed for Todd Roller, MD by Charolett Bumpers, ED Scribe. The patient was seen in room APA11/APA11. Patient's care was started at 0942.   CSN: [id number]  Arrival date & time 12/24/12  0927   First MD Initiated Contact with Patient 12/24/12 [phone]      Chief Complaint  Patient presents with  . Cough     The history is provided by the patient. No language interpreter was used.   Todd Page is a 62 y.o. male who presents to the Emergency Department complaining of persistent, moderate cough for the past few days. He states that he woke up this morning with a nose bleed, congestion, dizziness and difficulty breathing. He reports that he has trouble breathing on a normal day at times but does not have an inhaler or regular medications that he takes for his breathing. He reports that he was at baseline yesterday and woke up feeling ill. He denies any fevers but states that he has felt slightly warm. He denies any h/o asthma, COPD, emphysema. He does not have a PCP.   Past Medical History  Diagnosis Date  . Hypertension     minimal coronary atherosclerosis by CT  . Schizoaffective disorder     With depression  . Atrial fibrillation 06/2012    onset 06/2012; normal TSH; EF-45%  . Overweight   . Gout   . Noncompliance 09/21/2012  . Cholelithiasis     incidental finding on CT in 2013    Past Surgical History  Procedure Laterality Date  . Cardiac surgery      ? history accurate; no apparent incisions  . Colonoscopy  2006    normal screening examination    Family History  Problem Relation Age of Onset  . Hypertension Mother   . Liver disease Father     History  Substance Use Topics  . Smoking status: Never Smoker   . Smokeless tobacco: Not on file  . Alcohol Use: Yes     Comment: former       Review of Systems A complete 10 system review of systems was obtained and all systems are negative except as noted in the HPI and PMH.    Allergies  Review of patient's allergies indicates no known allergies.  Home Medications   Current Outpatient Rx  Name  Route  Sig  Dispense  Refill  . ALPRAZolam (XANAX) 0.25 MG tablet   Oral   Take 0.25 mg by mouth 3 (three) times daily as needed for sleep.         Marland Kitchen aspirin 81 MG tablet   Oral   Take 81 mg by mouth daily.         Marland Kitchen diltiazem (CARDIZEM LA) 240 MG 24 hr tablet   Oral   Take 1 tablet (240 mg total) by mouth at bedtime. For treatment of your elevated heart rate and high blood pressure.   30 tablet   0   . lisinopril (PRINIVIL,ZESTRIL) 5 MG tablet   Oral   Take 1 tablet (5 mg total) by mouth at bedtime. For treatment of your high blood pressure and your heart.   30 tablet   0     BP 163/110  Pulse 126  Temp(Src) 98.3 F (36.8 C) (Oral)  Resp 26  Ht 6' (1.829 m)  Wt 310 lb (140.615 kg)  BMI 42.03 kg/m2  SpO2 95%  Physical Exam  Nursing note and vitals reviewed. Constitutional:  He is oriented to person, place, and time. He appears well-developed and well-nourished. No distress.  HENT:  Head: Normocephalic and atraumatic.  Right Ear: External ear normal.  Left Ear: External ear normal.  Nose: Nose normal.  Mouth/Throat: Oropharynx is clear and moist. No oropharyngeal exudate.  Eyes: Conjunctivae and EOM are normal. Pupils are equal, round, and reactive to light. No scleral icterus.  Neck: Normal range of motion. Neck supple. No tracheal deviation present.  Cardiovascular: Normal rate, regular rhythm and normal heart sounds.   Pulmonary/Chest: No respiratory distress. He has wheezes. He has rales.  Diffuse wheezes, rales at left base. Increased work of breathing and speaks in short sentences.   Abdominal: Soft. Bowel sounds are normal. He exhibits no distension. There is no tenderness.  Musculoskeletal: Normal range of motion. He exhibits edema.  1+ edema in lower extremities bilaterally.   Neurological: He is alert and oriented to person,  place, and time.  Skin: Skin is warm and dry.  Psychiatric: He has a normal mood and affect. His behavior is normal.    ED Course  Procedures (including critical care time)  DIAGNOSTIC STUDIES: Oxygen Saturation is 95% on room air, adequate by my interpretation.    COORDINATION OF CARE:  09:45-Medication Orders: Albuterol (Proventil) (5 mg/mL) 0.5% nebulizer solution 5 mg-once; Ipratropium (Atrovent) nebulizer solution 0.5 mg-once.   09:50-Discussed planned course of treatment with the patient including a breathing treatment, chest x-ray, CBC with diff and BMP, who is agreeable at this time.   11:12-Recheck: Informed pt of imaging and lab results. Pt states that he has received his flu vaccine. Advised that admission in needed, he is agreeable with plan.   Results for orders placed during the hospital encounter of 12/24/12  CBC WITH DIFFERENTIAL      Result Value Range   WBC 5.1  4.0 - 10.5 K/uL   RBC 5.50  4.22 - 5.81 MIL/uL   Hemoglobin 14.9  13.0 - 17.0 g/dL   HCT 16.1  09.6 - 04.5 %   MCV 81.1  78.0 - 100.0 fL   MCH 27.1  26.0 - 34.0 pg   MCHC 33.4  30.0 - 36.0 g/dL   RDW 40.9  81.1 - 91.4 %   Platelets 191  150 - 400 K/uL   Neutrophils Relative 67  43 - 77 %   Neutro Abs 3.5  1.7 - 7.7 K/uL   Lymphocytes Relative 20  12 - 46 %   Lymphs Abs 1.0  0.7 - 4.0 K/uL   Monocytes Relative 11  3 - 12 %   Monocytes Absolute 0.6  0.1 - 1.0 K/uL   Eosinophils Relative 1  0 - 5 %   Eosinophils Absolute 0.0  0.0 - 0.7 K/uL   Basophils Relative 0  0 - 1 %   Basophils Absolute 0.0  0.0 - 0.1 K/uL  BASIC METABOLIC PANEL      Result Value Range   Sodium 135  135 - 145 mEq/L   Potassium 3.2 (*) 3.5 - 5.1 mEq/L   Chloride 99  96 - 112 mEq/L   CO2 29  19 - 32 mEq/L   Glucose, Bld 98  70 - 99 mg/dL   BUN 10  6 - 23 mg/dL   Creatinine, Ser 7.82  0.50 - 1.35 mg/dL   Calcium 9.2  8.4 - 95.6 mg/dL   GFR calc non Af Amer 64 (*) >90 mL/min   GFR calc Af Amer 74 (*) >90 mL/min  Dg  Chest 2 View  12/24/2012  *RADIOLOGY REPORT*  Clinical Data: Shortness of breath and dizziness.  CHEST - 2 VIEW  Comparison: CT abdomen and pelvis 11/10/2012.  Single view of the chest and CT chest 09/20/2012.  Findings: There is cardiomegaly without pulmonary edema. Subsegmental atelectasis is seen in the lung bases.  No pneumothorax or pleural fluid.  IMPRESSION: Cardiomegaly without acute disease.   Original Report Authenticated By: Holley Dexter, M.D.      1. Respiratory distress   2. Atrial fibrillation with rapid ventricular response       MDM  The patient presents with significant respiratory distress, I have personally interpreted his portable x-ray which shows no signs of significant pulmonary edema or pleural effusions or pulmonary infiltrates. His oxygen level is persistently low at 90-95% in his tachycardia is significant elevated at 110-130. His EKG shows atrial fibrillation with a rapid ventricular response. This is similar to prior EKGs. He is required continuous nebulizer therapy and a Cardizem drip, discussed his care with the hospitalist who will admit to the hospital.  ED ECG REPORT  I personally interpreted this EKG   Date: 12/24/2012   Rate: 115  Rhythm: atrial fibrillation and With rapid ventricular response  QRS Axis: normal  Intervals: normal  ST/T Wave abnormalities: nonspecific T wave changes  Conduction Disutrbances:none  Narrative Interpretation:   Old EKG Reviewed:  essentially unchanged comppareed with 01/25//20040   CRITICAL CARE Performed by: Eber Hong D   Total critical care time: 35  Critical care time was exclusive of separately billable procedures and treating other patients.  Critical care was necessary to treat or prevent imminent or life-threatening deterioration.  Critical care was time spent personally by me on the following activities: development of treatment plan with patient and/or surrogate as well as nursing, discussions with  consultants, evaluation of patient's response to treatment, examination of patient, obtaining history from patient or surrogate, ordering and performing treatments and interventions, ordering and review of laboratory studies, ordering and review of radiographic studies, pulse oximetry and re-evaluation of patient's condition.    I personally performed the services described in this documentation, which was scribed in my presence. The recorded information has been reviewed and is accurate.       Todd Roller, MD 12/24/12 1153

## 2012-12-24 NOTE — ED Notes (Signed)
Pt reports had nose bleed this am and c/o dizziness.  Reports has had cough and congestion for past few days.

## 2012-12-24 NOTE — Progress Notes (Addendum)
PT FOUND WANDERING AROUND IN ROOM. ASSISTED BACK TO BED. ASKED TO CALL FOR ASSISTANCE WHEN WANTING TO GET OOB. TOP SIDERAILS UP. CALL LIGHT IN LAP. BED ALARM IS ON.ICU STAFF INFORMED OF PT'S POSSIBLE TRYING TO GET OOB PER SELF AND NONCOMPLIANCE.

## 2012-12-24 NOTE — Progress Notes (Signed)
UR Chart Review Completed  

## 2012-12-24 NOTE — ED Notes (Addendum)
Patient states that he abruptly quit taking his medications and does not know why.  States that he is feeling short of breath, unable to speak in full sentences at present.

## 2012-12-25 DIAGNOSIS — I5023 Acute on chronic systolic (congestive) heart failure: Secondary | ICD-10-CM

## 2012-12-25 DIAGNOSIS — E876 Hypokalemia: Secondary | ICD-10-CM

## 2012-12-25 LAB — BASIC METABOLIC PANEL
BUN: 15 mg/dL (ref 6–23)
Creatinine, Ser: 1.22 mg/dL (ref 0.50–1.35)
GFR calc Af Amer: 72 mL/min — ABNORMAL LOW (ref >90)
GFR calc non Af Amer: 62 mL/min — ABNORMAL LOW (ref >90)

## 2012-12-25 LAB — CBC
HCT: 41.7 % (ref 39.0–52.0)
MCH: 27.1 pg (ref 26.0–34.0)
MCHC: 33.6 g/dL (ref 30.0–36.0)
MCV: 80.7 fL (ref 78.0–100.0)
RDW: 14.4 % (ref 11.5–15.5)

## 2012-12-25 LAB — MAGNESIUM: Magnesium: 2 mg/dL (ref 1.5–2.5)

## 2012-12-25 MED ORDER — POTASSIUM CHLORIDE CRYS ER 20 MEQ PO TBCR
40.0000 meq | EXTENDED_RELEASE_TABLET | ORAL | Status: AC
  Administered 2012-12-25: 40 meq via ORAL
  Filled 2012-12-25 (×2): qty 2

## 2012-12-25 MED ORDER — DILTIAZEM HCL 30 MG PO TABS
30.0000 mg | ORAL_TABLET | Freq: Four times a day (QID) | ORAL | Status: DC
  Administered 2012-12-25 – 2012-12-26 (×4): 30 mg via ORAL
  Filled 2012-12-25 (×4): qty 1

## 2012-12-25 MED ORDER — DILTIAZEM HCL 100 MG IV SOLR
5.0000 mg/h | INTRAVENOUS | Status: AC
  Filled 2012-12-25: qty 100

## 2012-12-25 MED ORDER — ALBUTEROL SULFATE (5 MG/ML) 0.5% IN NEBU
2.5000 mg | INHALATION_SOLUTION | RESPIRATORY_TRACT | Status: DC | PRN
  Administered 2012-12-25 – 2012-12-26 (×2): 2.5 mg via RESPIRATORY_TRACT
  Filled 2012-12-25: qty 0.5

## 2012-12-25 MED ORDER — POTASSIUM CHLORIDE CRYS ER 20 MEQ PO TBCR
40.0000 meq | EXTENDED_RELEASE_TABLET | Freq: Every day | ORAL | Status: DC
  Administered 2012-12-25 – 2012-12-29 (×5): 40 meq via ORAL
  Filled 2012-12-25 (×4): qty 2

## 2012-12-25 MED ORDER — LISINOPRIL 10 MG PO TABS
20.0000 mg | ORAL_TABLET | Freq: Every day | ORAL | Status: DC
  Administered 2012-12-25 – 2012-12-30 (×6): 20 mg via ORAL
  Filled 2012-12-25 (×7): qty 2

## 2012-12-25 MED ORDER — ISOSORBIDE MONONITRATE ER 60 MG PO TB24
30.0000 mg | ORAL_TABLET | Freq: Every day | ORAL | Status: DC
  Administered 2012-12-25 – 2012-12-30 (×6): 30 mg via ORAL
  Filled 2012-12-25 (×6): qty 1

## 2012-12-25 NOTE — Progress Notes (Addendum)
PT TRANSFERING TO ROOM 320. PT ALERT AND ORIENTED. O2 SAT 95% ON ROOM AIR.CONTINUES TO HAVE DIMINISHED BREATH SOUNDS AND MILD EXPIRATORY WHEEZING. DENIES ANY SOB OR CHEST PAIN. NO COUGH. RT AC NSL PATENT AND INTACT.the patient IS COOPERATIVE. REPORT CALLED TO THOMAS RN ON 300. RN TOLD THAT PT IS A HIGH FALL RISK AND WILL NEED A BED ALARM IN PLACE. PT WILL REMAIN ON TELEMETRY. PT REMAIMS IN ATRIAL FIBRILLATION IN CONTROLED RATE.TRANSPORTRD TO 320 VIA WC.

## 2012-12-25 NOTE — Progress Notes (Signed)
[name] PROGRESS NOTE  Todd TUZZOLINO ZOX:[id number] DOB: [birth year] DOA: 12/24/2012 PCP: No primary provider on file.  Assessment/Plan: Atrial fibrillation with rapid ventricular response: hx of same. Likely related to non-compliance with medication. Has been rate controled for several hours. Cardizem drip currently on 5mg . Will transition to po. Likely transfer to tele later today.  Not on anticoagulation due to non-compliance. TSH within normal limits. Troponin neg. Chest xray without pulmonary edema. Mild cardiomegaly.  Active Problems:   Acute on chronic systolic CHF: likely  medication non-compliance. Echo in 9/13 yields EF 45% with severe hypokinesis and moderate LVH. Remains wheezy but dyspnea improved.  Continue  IV lasix. Volume status -1.2L.  continue nebs and steroids. Sat range 90-93 on 2L.  Intake and output. Daily weight.   Hypokalemia: likely related to diuresis. Will replete and recheck     Malignant hypertension: Improved control. Continue  Lisinopril as well as lasix and cardizem. Monitor closely.    Obesity: BMI 42.1. Nutritional consult   Schizoaffective disorder: appears at baseline.   Bilateral leg pain: hx of gout. Uric acid elevated. Prednisone should help with this.       Code Status: full Family Communication: pt at bedside Disposition Plan: home when stable.    Consultants:  none  Procedures:  none  Antibiotics:  none  HPI/Subjective: Awake alert. Reports sleeping well and feeling better  Objective: Filed Vitals:   12/25/12 0715 12/25/12 0730 12/25/12 0745 12/25/12 0800  BP: 134/84 135/87 138/93 152/109  Pulse: 82 83 60 36  Temp:    97.5 F (36.4 C)  TempSrc:    Oral  Resp: 26 25 36 18  Height:      Weight:      SpO2: 90% 95% 93% 93%    Intake/Output Summary (Last 24 hours) at 12/25/12 0852 Last data filed at 12/25/12 0800  Gross per 24 hour  Intake   1430 ml  Output   2200 ml  Net   -770 ml   Filed Weights   12/24/12  0937 12/24/12 1413 12/25/12 0500  Weight: 140.615 kg (310 lb) 138.6 kg (305 lb 8.9 oz) 136.6 kg (301 lb 2.4 oz)    Exam:   General:  Alert obese NAD  Cardiovascular: irregularly irregular  Respiratory: mild increased work of breathing with conversation. Bilateral wheezes and rhonchi. Fair air flow  Abdomen: obese soft +BS non-tender to palpation  Data Reviewed: Basic Metabolic Panel:  Recent Labs Lab 12/24/12 1009 12/25/12 0213  NA 135 137  K 3.2* 3.0*  CL 99 99  CO2 29 27  GLUCOSE 98 136*  BUN 10 15  CREATININE 1.19 1.22  CALCIUM 9.2 9.0   Liver Function Tests: No results found for this basename: AST, ALT, ALKPHOS, BILITOT, PROT, ALBUMIN,  in the last 168 hours No results found for this basename: LIPASE, AMYLASE,  in the last 168 hours No results found for this basename: AMMONIA,  in the last 168 hours CBC:  Recent Labs Lab 12/24/12 1009 12/25/12 0213  WBC 5.1 4.3  NEUTROABS 3.5  --   HGB 14.9 14.0  HCT 44.6 41.7  MCV 81.1 80.7  PLT 191 204   Cardiac Enzymes:  Recent Labs Lab 12/24/12 1420 12/24/12 2030 12/25/12 0213  TROPONINI <0.30 <0.30 <0.30   BNP (last 3 results)  Recent Labs  09/20/12 1808 11/10/12 2123 12/24/12 1150  PROBNP 665.0* 390.4* 1207.0*   CBG: No results found for this basename: GLUCAP,  in the last 168 hours  Recent Results (from the past 240 hour(s))  MRSA PCR SCREENING     Status: None   Collection Time    12/24/12  2:34 PM      Result Value Range Status   MRSA by PCR NEGATIVE  NEGATIVE Final   Comment:            The GeneXpert MRSA Assay (FDA     approved for NASAL specimens     only), is one component of a     comprehensive MRSA colonization     surveillance program. It is not     intended to diagnose MRSA     infection nor to guide or     monitor treatment for     MRSA infections.     Studies: Dg Chest 2 View  12/24/2012  *RADIOLOGY REPORT*  Clinical Data: Shortness of breath and dizziness.  CHEST - 2 VIEW   Comparison: CT abdomen and pelvis 11/10/2012.  Single view of the chest and CT chest 09/20/2012.  Findings: There is cardiomegaly without pulmonary edema. Subsegmental atelectasis is seen in the lung bases.  No pneumothorax or pleural fluid.  IMPRESSION: Cardiomegaly without acute disease.   Original Report Authenticated By: Holley Dexter, M.D.     Scheduled Meds: . aspirin EC  81 mg Oral Daily  . diltiazem  30 mg Oral Q6H  . enoxaparin (LOVENOX) injection  40 mg Subcutaneous Q24H  . furosemide  40 mg Intravenous BID  . levalbuterol  0.63 mg Nebulization Q6H  . lisinopril  5 mg Oral Daily  . potassium chloride  40 mEq Oral Q4H  . predniSONE  60 mg Oral Q breakfast   Continuous Infusions: . diltiazem (CARDIZEM) infusion      Principal Problem:   Atrial fibrillation with rapid ventricular response Active Problems:   Obesity   Schizoaffective disorder   Malignant hypertension   Dyspnea   Bilateral leg pain   Hypokalemia Acute on chronic systolic heart failure    Time spent:     [facility] M  [name]  If 8PM-8AM, please contact night-coverage at [email or web address], password [facility] 12/25/2012, 8:52 AM  LOS: 1 day    Attending note:  Patient seen and examined.  Above note reviewed.    Respiratory status appears to be improving. Wheezing has improved. Pedal edema also appears to be improving.  Would continue lasix for now.  Continue prednisone and nebs for now as well  Will transition off of cardizem infusion to oral cardizem.  Increase lisinopril for better blood pressure control.  Would avoid beta blocker with ongoing wheezing.  Likely transfer to telemetry later today.

## 2012-12-25 NOTE — Progress Notes (Signed)
Patient c/o of SOB and distress with breathing;placed on 2 LPM Kieler and patient c/o congestion and work of breathing increased. Patient assessed and Q4 prn order for Albuterol was implemented to assist patient with SOB and wheezing. I will continue to monitor patient.

## 2012-12-26 ENCOUNTER — Inpatient Hospital Stay (HOSPITAL_COMMUNITY): Payer: Medicare Other

## 2012-12-26 LAB — CBC
HCT: 44.1 % (ref 39.0–52.0)
Hemoglobin: 14.7 g/dL (ref 13.0–17.0)
MCH: 27.4 pg (ref 26.0–34.0)
MCV: 82.1 fL (ref 78.0–100.0)
Platelets: 199 10*3/uL (ref 150–400)
RBC: 5.37 MIL/uL (ref 4.22–5.81)
WBC: 8.5 10*3/uL (ref 4.0–10.5)

## 2012-12-26 LAB — BLOOD GAS, ARTERIAL
TCO2: 24.5 mmol/L (ref 0–100)
pCO2 arterial: 43.2 mmHg (ref 35.0–45.0)
pH, Arterial: 7.428 (ref 7.350–7.450)
pO2, Arterial: 60.1 mmHg — ABNORMAL LOW (ref 80.0–100.0)

## 2012-12-26 LAB — BASIC METABOLIC PANEL
CO2: 28 mEq/L (ref 19–32)
Chloride: 98 mEq/L (ref 96–112)
Glucose, Bld: 100 mg/dL — ABNORMAL HIGH (ref 70–99)
Potassium: 3.7 mEq/L (ref 3.5–5.1)
Sodium: 137 mEq/L (ref 135–145)

## 2012-12-26 LAB — PRO B NATRIURETIC PEPTIDE: Pro B Natriuretic peptide (BNP): 223.3 pg/mL — ABNORMAL HIGH (ref 0–125)

## 2012-12-26 MED ORDER — DILTIAZEM HCL 60 MG PO TABS
60.0000 mg | ORAL_TABLET | Freq: Four times a day (QID) | ORAL | Status: DC
  Administered 2012-12-26 – 2012-12-30 (×17): 60 mg via ORAL
  Filled 2012-12-26 (×17): qty 1

## 2012-12-26 MED ORDER — IPRATROPIUM BROMIDE 0.02 % IN SOLN
0.5000 mg | Freq: Four times a day (QID) | RESPIRATORY_TRACT | Status: DC
  Administered 2012-12-26 – 2012-12-30 (×15): 0.5 mg via RESPIRATORY_TRACT
  Filled 2012-12-26 (×16): qty 2.5

## 2012-12-26 MED ORDER — LEVALBUTEROL HCL 0.63 MG/3ML IN NEBU
0.6300 mg | INHALATION_SOLUTION | RESPIRATORY_TRACT | Status: DC | PRN
  Administered 2012-12-27 – 2012-12-28 (×2): 0.63 mg via RESPIRATORY_TRACT

## 2012-12-26 MED ORDER — ALBUTEROL SULFATE (5 MG/ML) 0.5% IN NEBU
INHALATION_SOLUTION | RESPIRATORY_TRACT | Status: AC
  Filled 2012-12-26: qty 0.5

## 2012-12-26 MED ORDER — BIOTENE DRY MOUTH MT LIQD
15.0000 mL | Freq: Two times a day (BID) | OROMUCOSAL | Status: DC
  Administered 2012-12-27 – 2012-12-29 (×6): 15 mL via OROMUCOSAL

## 2012-12-26 MED ORDER — METHYLPREDNISOLONE SODIUM SUCC 125 MG IJ SOLR
60.0000 mg | Freq: Four times a day (QID) | INTRAMUSCULAR | Status: DC
  Administered 2012-12-26 – 2012-12-28 (×8): 60 mg via INTRAVENOUS
  Filled 2012-12-26 (×8): qty 2

## 2012-12-26 MED ORDER — LEVOFLOXACIN IN D5W 500 MG/100ML IV SOLN
500.0000 mg | INTRAVENOUS | Status: DC
  Administered 2012-12-26 – 2012-12-27 (×2): 500 mg via INTRAVENOUS
  Filled 2012-12-26 (×4): qty 100

## 2012-12-26 NOTE — Progress Notes (Signed)
Patient having increased SOB and is wheezing. No acute distress. Saturations are 97% on 2L. Patient's situation discussed with MD. Stat ABGs and chest x ray ordered. Will continue to monitor.

## 2012-12-26 NOTE — Progress Notes (Signed)
[name] PROGRESS NOTE  JARMARCUS WAMBOLD ZOX:[id number] DOB: May 03, 1951 DOA: 12/24/2012 PCP: No primary provider on file.  Assessment/Plan: Atrial fibrillation with rapid ventricular response: hx of same. Likely related to non compliance and exacerbated by pulmonary issues. His heart rate is increased today.  Will increase cardizem dosing. Not on anticoagulation due to non compliance. Continue tele.   Active Problems:   Acute bronchitis.  Patient has continued wheezing and shortness of breath.  Chest xray does indicate edema or infiltrate.  Likely related to bronchitis.  Changed prednisone to solumedrol and started levaquin.  Acute on chronic systolic CHF: likely medication non-compliance. Echo in 9/13 yields EF 45% with severe hypokinesis and moderate LVH. Increased wheezing and increased work of breathing.  Continue IV lasix. Volume status -3.4L. continue nebs. Sat range 93-97 on 2L.    Hypokalemia: likely related to diuresis. Resolved. Continue to monitor given continuation of lasix.   Malignant hypertension: Only fair control. Lisinopril increased yesterday. Continue lasix and cardizem. Monitor closely.   Obesity: BMI 42.1. Nutritional consult   Schizoaffective disorder: appears at baseline.   Bilateral leg pain: hx of gout. Improved today.        Code Status: full Family Communication:  Disposition Plan: Home when ready. Hopefully tomorrow   Consultants:  none  Procedures:  none  Antibiotics: levaquin 12/26/12>>>>>  HPI/Subjective: Patient became short of breath and wheezing overnight.  Has difficulty completing sentence.  Objective: Filed Vitals:   12/26/12 0427 12/26/12 0618 12/26/12 0706 12/26/12 0921  BP: 169/97   154/79  Pulse: 110   102  Temp: 98.8 F (37.1 C)     TempSrc: Axillary     Resp: 28     Height:      Weight:      SpO2: 97% 97% 92% 95%    Intake/Output Summary (Last 24 hours) at 12/26/12 1007 Last data filed at 12/26/12 0145  Gross  per 24 hour  Intake    720 ml  Output   2000 ml  Net  -1280 ml   Filed Weights   12/24/12 0937 12/24/12 1413 12/25/12 0500  Weight: 140.615 kg (310 lb) 138.6 kg (305 lb 8.9 oz) 136.6 kg (301 lb 2.4 oz)    Exam:   General:  Obese sitting up in bed eating.  Cardiovascular: irregular 1+LEE No MGR  Respiratory: moderate increased work of breathing with eating and room air. Diminished air movement, diffuse wheezing anterior and posterior lung fields.   Abdomen: obese soft. Non-tender to palpation. +BS throughout.   Data Reviewed: Basic Metabolic Panel:  Recent Labs Lab 12/24/12 1009 12/25/12 0213 12/26/12 0505  NA 135 137 137  K 3.2* 3.0* 3.7  CL 99 99 98  CO2 29 27 28   GLUCOSE 98 136* 100*  BUN 10 15 20   CREATININE 1.19 1.22 1.27  CALCIUM 9.2 9.0 9.3  MG  --  2.0  --    Liver Function Tests: No results found for this basename: AST, ALT, ALKPHOS, BILITOT, PROT, ALBUMIN,  in the last 168 hours No results found for this basename: LIPASE, AMYLASE,  in the last 168 hours No results found for this basename: AMMONIA,  in the last 168 hours CBC:  Recent Labs Lab 12/24/12 1009 12/25/12 0213 12/26/12 0505  WBC 5.1 4.3 8.5  NEUTROABS 3.5  --   --   HGB 14.9 14.0 14.7  HCT 44.6 41.7 44.1  MCV 81.1 80.7 82.1  PLT 191 204 199   Cardiac Enzymes:  Recent Labs  Lab 12/24/12 1420 12/24/12 2030 12/25/12 0213  TROPONINI <0.30 <0.30 <0.30   BNP (last 3 results)  Recent Labs  09/20/12 1808 11/10/12 2123 12/24/12 1150  PROBNP 665.0* 390.4* 1207.0*   CBG: No results found for this basename: GLUCAP,  in the last 168 hours  Recent Results (from the past 240 hour(s))  MRSA PCR SCREENING     Status: None   Collection Time    12/24/12  2:34 PM      Result Value Range Status   MRSA by PCR NEGATIVE  NEGATIVE Final   Comment:            The GeneXpert MRSA Assay (FDA     approved for NASAL specimens     only), is one component of a     comprehensive MRSA colonization      surveillance program. It is not     intended to diagnose MRSA     infection nor to guide or     monitor treatment for     MRSA infections.     Studies: Dg Chest 2 View  12/24/2012  *RADIOLOGY REPORT*  Clinical Data: Shortness of breath and dizziness.  CHEST - 2 VIEW  Comparison: CT abdomen and pelvis 11/10/2012.  Single view of the chest and CT chest 09/20/2012.  Findings: There is cardiomegaly without pulmonary edema. Subsegmental atelectasis is seen in the lung bases.  No pneumothorax or pleural fluid.  IMPRESSION: Cardiomegaly without acute disease.   Original Report Authenticated By: Holley Dexter, M.D.    Dg Chest Port 1 View  12/26/2012  *RADIOLOGY REPORT*  Clinical Data: Dyspnea.  PORTABLE CHEST - 1 VIEW  Comparison: 02/26 and 11/10/2012  Findings: The heart size and pulmonary vascularity are normal.  No infiltrates or effusions.  Chronic slight elevation of the right hemidiaphragm.  Slight peribronchial thickening on the left.  IMPRESSION: Slight peribronchial thickening suggesting bronchitis.   Original Report Authenticated By: [name], M.D.     Scheduled Meds: . albuterol      . aspirin EC  81 mg Oral Daily  . diltiazem  60 mg Oral Q6H  . enoxaparin (LOVENOX) injection  40 mg Subcutaneous Q24H  . furosemide  40 mg Intravenous BID  . isosorbide mononitrate  30 mg Oral Daily  . levalbuterol  0.63 mg Nebulization Q6H  . levofloxacin (LEVAQUIN) IV  500 mg Intravenous Q24H  . lisinopril  20 mg Oral Daily  . methylPREDNISolone (SOLU-MEDROL) injection  60 mg Intravenous Q6H  . potassium chloride  40 mEq Oral Daily   Continuous Infusions:   Principal Problem:   Atrial fibrillation with rapid ventricular response Active Problems:   Obesity   Schizoaffective disorder   Malignant hypertension   Dyspnea   Bilateral leg pain   Hypokalemia   Acute on chronic systolic HF (heart failure)    Time spent: 35 minutes    [facility] M  [name]  If  8PM-8AM, please contact night-coverage at [email or web address], password [facility] 12/26/2012, 10:07 AM  LOS: 2 days   Attending note:  Patient seen and examined. Agree with note as above.
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
## 2012-12-27 LAB — CBC
Hemoglobin: 14.7 g/dL (ref 13.0–17.0)
MCHC: 33.3 g/dL (ref 30.0–36.0)
RDW: 14.6 % (ref 11.5–15.5)
WBC: 5.5 10*3/uL (ref 4.0–10.5)

## 2012-12-27 LAB — BASIC METABOLIC PANEL
Chloride: 96 mEq/L (ref 96–112)
GFR calc Af Amer: 68 mL/min — ABNORMAL LOW (ref >90)
GFR calc non Af Amer: 59 mL/min — ABNORMAL LOW (ref >90)
Glucose, Bld: 152 mg/dL — ABNORMAL HIGH (ref 70–99)
Potassium: 3.8 mEq/L (ref 3.5–5.1)
Sodium: 137 mEq/L (ref 135–145)

## 2012-12-27 NOTE — Progress Notes (Signed)
[name] PROGRESS NOTE  Todd Page UUV:[id number] DOB: 15-Sep-1951 DOA: 12/24/2012 PCP: No primary provider on file.  Assessment/Plan: Atrial fibrillation with rapid ventricular response: hx of same. Likely related to non compliance and exacerbated by pulmonary issues. Cardizem dose was recently increased. Heart rate is better today. Not on anticoagulation due to non compliance. Continue tele.   Active Problems:   Acute bronchitis.  Patient has continued wheezing and shortness of breath, although he is showing some improvement.  Chest xray does indicate edema or infiltrate.  Likely related to bronchitis.  He is currently on Solu-Medrol and Levaquin. Continue nebulization treatments  Acute on chronic systolic CHF: likely medication non-compliance. Echo in 9/13 yields EF 45% with severe hypokinesis and moderate LVH. Increased wheezing and increased work of breathing. Volume status -4.9L. continue nebs. We'll discontinue Lasix since BUN is trending up   Hypokalemia: likely related to diuresis. Resolved.    Malignant hypertension: Only fair control. Lisinopril increased yesterday. Continue lasix and cardizem. Monitor closely.   Obesity: BMI 42.1. Nutritional consult   Schizoaffective disorder: appears at baseline.   Bilateral leg pain: hx of gout. Improved today.     Code Status: full Family Communication:  Disposition Plan: Home when ready. Hopefully in the next 24-48 hours.   Consultants:  none  Procedures:  none  Antibiotics: levaquin 12/26/12>>>>>  HPI/Subjective: Feels that his breathing is slowly improving. Has difficulty lying flat but this has been a chronic problem. This is likely related to his weight. Patient is tearful today due to multiple family issues.  Objective: Filed Vitals:   12/27/12 0026 12/27/12 0419 12/27/12 0738 12/27/12 0849  BP:  144/90    Pulse:  89    Temp:  97.6 F (36.4 C)    TempSrc:  Oral    Resp:  22    Height:      Weight:   135.7 kg (299 lb 2.6 oz)    SpO2: 91% 90% 88% 93%    Intake/Output Summary (Last 24 hours) at 12/27/12 1146 Last data filed at 12/27/12 0951  Gross per 24 hour  Intake   1660 ml  Output   3200 ml  Net  -1540 ml   Filed Weights   12/24/12 1413 12/25/12 0500 12/27/12 0419  Weight: 138.6 kg (305 lb 8.9 oz) 136.6 kg (301 lb 2.4 oz) 135.7 kg (299 lb 2.6 oz)    Exam:   General:  Obese sitting up in bed eating.  Cardiovascular: irregular 1+LEE No MGR  Respiratory: Diminished air movement, diffuse wheezing anterior and posterior lung fields, improving.   Abdomen: obese soft. Non-tender to palpation. +BS throughout.   Data Reviewed: Basic Metabolic Panel:  Recent Labs Lab 12/24/12 1009 12/25/12 0213 12/26/12 0505 12/27/12 0544  NA 135 137 137 137  K 3.2* 3.0* 3.7 3.8  CL 99 99 98 96  CO2 29 27 28 29   GLUCOSE 98 136* 100* 152*  BUN 10 15 20  24*  CREATININE 1.19 1.22 1.27 1.28  CALCIUM 9.2 9.0 9.3 9.3  MG  --  2.0  --   --    Liver Function Tests: No results found for this basename: AST, ALT, ALKPHOS, BILITOT, PROT, ALBUMIN,  in the last 168 hours No results found for this basename: LIPASE, AMYLASE,  in the last 168 hours No results found for this basename: AMMONIA,  in the last 168 hours CBC:  Recent Labs Lab 12/24/12 1009 12/25/12 0213 12/26/12 0505 12/27/12 0544  WBC 5.1 4.3 8.5 5.5  NEUTROABS 3.5  --   --   --   HGB 14.9 14.0 14.7 14.7  HCT 44.6 41.7 44.1 44.1  MCV 81.1 80.7 82.1 81.1  PLT 191 204 199 228   Cardiac Enzymes:  Recent Labs Lab 12/24/12 1420 12/24/12 2030 12/25/12 0213  TROPONINI <0.30 <0.30 <0.30   BNP (last 3 results)  Recent Labs  11/10/12 2123 12/24/12 1150 12/26/12 1016  PROBNP 390.4* 1207.0* 223.3*   CBG: No results found for this basename: GLUCAP,  in the last 168 hours  Recent Results (from the past 240 hour(s))  MRSA PCR SCREENING     Status: None   Collection Time    12/24/12  2:34 PM      Result Value Range  Status   MRSA by PCR NEGATIVE  NEGATIVE Final   Comment:            The GeneXpert MRSA Assay (FDA     approved for NASAL specimens     only), is one component of a     comprehensive MRSA colonization     surveillance program. It is not     intended to diagnose MRSA     infection nor to guide or     monitor treatment for     MRSA infections.     Studies: Dg Chest Port 1 View  12/26/2012  *RADIOLOGY REPORT*  Clinical Data: Dyspnea.  PORTABLE CHEST - 1 VIEW  Comparison: 02/26 and 11/10/2012  Findings: The heart size and pulmonary vascularity are normal.  No infiltrates or effusions.  Chronic slight elevation of the right hemidiaphragm.  Slight peribronchial thickening on the left.  IMPRESSION: Slight peribronchial thickening suggesting bronchitis.   Original Report Authenticated By: [name], M.D.     Scheduled Meds: . antiseptic oral rinse  15 mL Mouth Rinse BID  . aspirin EC  81 mg Oral Daily  . diltiazem  60 mg Oral Q6H  . enoxaparin (LOVENOX) injection  40 mg Subcutaneous Q24H  . ipratropium  0.5 mg Nebulization Q6H  . isosorbide mononitrate  30 mg Oral Daily  . levalbuterol  0.63 mg Nebulization Q6H  . levofloxacin (LEVAQUIN) IV  500 mg Intravenous Q24H  . lisinopril  20 mg Oral Daily  . methylPREDNISolone (SOLU-MEDROL) injection  60 mg Intravenous Q6H  . potassium chloride  40 mEq Oral Daily   Continuous Infusions:   Principal Problem:   Atrial fibrillation with rapid ventricular response Active Problems:   Obesity   Schizoaffective disorder   Malignant hypertension   Dyspnea   Bilateral leg pain   Hypokalemia   Acute on chronic systolic HF (heart failure)    Time spent: 35 minutes    Page,Todd  [name]  If 8PM-8AM, please contact night-coverage at [email or web address], password [facility] 12/27/2012, 11:46 AM  LOS: 3 days

## 2012-12-28 DIAGNOSIS — R0609 Other forms of dyspnea: Secondary | ICD-10-CM

## 2012-12-28 DIAGNOSIS — I5032 Chronic diastolic (congestive) heart failure: Secondary | ICD-10-CM

## 2012-12-28 DIAGNOSIS — R0989 Other specified symptoms and signs involving the circulatory and respiratory systems: Secondary | ICD-10-CM

## 2012-12-28 DIAGNOSIS — I4891 Unspecified atrial fibrillation: Secondary | ICD-10-CM

## 2012-12-28 MED ORDER — LEVOFLOXACIN 750 MG PO TABS
750.0000 mg | ORAL_TABLET | Freq: Every day | ORAL | Status: DC
  Administered 2012-12-28 – 2012-12-29 (×2): 750 mg via ORAL
  Filled 2012-12-28 (×2): qty 1

## 2012-12-28 MED ORDER — METHYLPREDNISOLONE SODIUM SUCC 125 MG IJ SOLR
60.0000 mg | Freq: Two times a day (BID) | INTRAMUSCULAR | Status: DC
  Administered 2012-12-28 – 2012-12-29 (×2): 60 mg via INTRAVENOUS
  Filled 2012-12-28 (×2): qty 2

## 2012-12-28 NOTE — Progress Notes (Signed)
Pt's O2 sat was 91% on room air while sitting in chair, pt maintained O2 of 91% while ambulating to nurses station and back on room air.  Pt remains on room air with no complaints.  Ernesta Amble, RN

## 2012-12-28 NOTE — Progress Notes (Signed)
[name] PROGRESS NOTE  Todd Page VWU:[id number] DOB: 05-Oct-1951 DOA: 12/24/2012 PCP: No primary provider on file.  Assessment/Plan: Atrial fibrillation with rapid ventricular response: hx of same. Likely related to non compliance and exacerbated by pulmonary issues. Cardizem dose was recently increased. Heart rate is stable for the last 24 hours. Not on anticoagulation due to non compliance. Continue tele.   Active Problems:   Acute bronchitis.  Patient's breathing much improved. Decreased wheezing and shortness of breath. We'll work on weaning down oxygen and ambulating on room air while checking pulse ox Chest xray does indicate edema or infiltrate.  Likely related to bronchitis. Decrease Solu-Medrol and change Levaquin to by mouth. Continue nebulization treatments  Acute on chronic systolic CHF: likely medication non-compliance. Echo in 9/13 yields EF 45% with severe hypokinesis and moderate LVH. Increased wheezing and increased work of breathing. Volume status -4.9L. continue nebs. We'll discontinue Lasix since BUN is trending up   Hypokalemia: likely related to diuresis. Resolved.    Malignant hypertension: Only fair control. Lisinopril increased on 2/28 Continue lasix and cardizem. Monitor closely.   Obesity: BMI 42.1. Nutritional consult   Schizoaffective disorder: appears at baseline.   Bilateral leg pain: hx of gout. Improved today.     Code Status: full Family Communication:  Disposition Plan: Home when ready. Hopefully in the next 24-48 hours.   Consultants:  none  Procedures:  none  Antibiotics: levaquin 12/26/12>>>>>  HPI/Subjective: Feeling much better. No complaints. Breathing easier. No chest pain or palpitations. No coughing.  Objective: Filed Vitals:   12/28/12 0538 12/28/12 0547 12/28/12 0640 12/28/12 0736  BP:  144/98 158/89   Pulse:  88    Temp:  97.3 F (36.3 C)    TempSrc:      Resp:  24    Height:      Weight:  136 kg (299 lb 13.2  oz)    SpO2: 91% 99%  90%    Intake/Output Summary (Last 24 hours) at 12/28/12 1127 Last data filed at 12/28/12 0800  Gross per 24 hour  Intake   1420 ml  Output    700 ml  Net    720 ml   Filed Weights   12/25/12 0500 12/27/12 0419 12/28/12 0547  Weight: 136.6 kg (301 lb 2.4 oz) 135.7 kg (299 lb 2.6 oz) 136 kg (299 lb 13.2 oz)    Exam:   General:  Obese sitting up in bed eating.  Cardiovascular: Irregular rhythm, rate controlled  Respiratory: Decreased breath sounds throughout, in part due to obesity. Otherwise clear  Abdomen: obese soft. Non-tender to palpation. +BS throughout.   Extremities: No clubbing or cyanosis, trace pitting edema. Signs of chronic venous stasis  Data Reviewed: Basic Metabolic Panel:  Recent Labs Lab 12/24/12 1009 12/25/12 0213 12/26/12 0505 12/27/12 0544  NA 135 137 137 137  K 3.2* 3.0* 3.7 3.8  CL 99 99 98 96  CO2 29 27 28 29   GLUCOSE 98 136* 100* 152*  BUN 10 15 20  24*  CREATININE 1.19 1.22 1.27 1.28  CALCIUM 9.2 9.0 9.3 9.3  MG  --  2.0  --   --    CBC:  Recent Labs Lab 12/24/12 1009 12/25/12 0213 12/26/12 0505 12/27/12 0544  WBC 5.1 4.3 8.5 5.5  NEUTROABS 3.5  --   --   --   HGB 14.9 14.0 14.7 14.7  HCT 44.6 41.7 44.1 44.1  MCV 81.1 80.7 82.1 81.1  PLT 191 204 199 228  Cardiac Enzymes:  Recent Labs Lab 12/24/12 1420 12/24/12 2030 12/25/12 0213  TROPONINI <0.30 <0.30 <0.30   BNP (last 3 results)  Recent Labs  11/10/12 2123 12/24/12 1150 12/26/12 1016  PROBNP 390.4* 1207.0* 223.3*     Studies: No results found.  Scheduled Meds: . antiseptic oral rinse  15 mL Mouth Rinse BID  . aspirin EC  81 mg Oral Daily  . diltiazem  60 mg Oral Q6H  . enoxaparin (LOVENOX) injection  40 mg Subcutaneous Q24H  . ipratropium  0.5 mg Nebulization Q6H  . isosorbide mononitrate  30 mg Oral Daily  . levalbuterol  0.63 mg Nebulization Q6H  . levofloxacin  750 mg Oral Daily  . lisinopril  20 mg Oral Daily  .  methylPREDNISolone (SOLU-MEDROL) injection  60 mg Intravenous Q12H  . potassium chloride  40 mEq Oral Daily   Continuous Infusions:   Principal Problem:   Atrial fibrillation with rapid ventricular response Active Problems:   Obesity   Schizoaffective disorder   Malignant hypertension   Dyspnea   Bilateral leg pain   Hypokalemia   Acute on chronic systolic HF (heart failure)    Time spent: 25 minutes    Hollice Espy  [name] [phone]  If 7PM-7AM, please contact night-coverage at [email or web address], password [facility] 12/28/2012, 11:27 AM  LOS: 4 days

## 2012-12-28 NOTE — Progress Notes (Signed)
PHARMACIST - PHYSICIAN COMMUNICATION DR:   Kerry Hough CONCERNING: Antibiotic IV to Oral Route Change Policy  RECOMMENDATION: This patient is receiving Levaquin by the intravenous route.  Based on criteria approved by the Pharmacy and Therapeutics Committee, the antibiotic(s) is/are being converted to the equivalent oral dose form(s).  DESCRIPTION: These criteria include:  Patient being treated for a respiratory tract infection, urinary tract infection, or cellulitis  The patient is not neutropenic and does not exhibit a GI malabsorption state  The patient is eating (either orally or via tube) and/or has been taking other orally administered medications for a least 24 hours  The patient is improving clinically and has a Tmax < 100.5  If you have questions about this conversion, please contact the Pharmacy Department  [x]   [phone] )  Todd Page []   [phone] )  Todd Page  []   [phone] )  [facility] []   [phone] )  [facility]   S. Margo Aye, PharmD

## 2012-12-29 DIAGNOSIS — I10 Essential (primary) hypertension: Secondary | ICD-10-CM

## 2012-12-29 DIAGNOSIS — Z9119 Patient's noncompliance with other medical treatment and regimen: Secondary | ICD-10-CM

## 2012-12-29 LAB — BASIC METABOLIC PANEL
Chloride: 97 mEq/L (ref 96–112)
GFR calc Af Amer: 70 mL/min — ABNORMAL LOW (ref >90)
Potassium: 3.6 mEq/L (ref 3.5–5.1)

## 2012-12-29 LAB — MAGNESIUM: Magnesium: 2.4 mg/dL (ref 1.5–2.5)

## 2012-12-29 MED ORDER — POTASSIUM CHLORIDE CRYS ER 20 MEQ PO TBCR: 20.0000 meq | EXTENDED_RELEASE_TABLET | Freq: Every day | ORAL | Status: DC

## 2012-12-29 MED ORDER — PREDNISONE 20 MG PO TABS
60.0000 mg | ORAL_TABLET | Freq: Two times a day (BID) | ORAL | Status: DC
  Administered 2012-12-29 – 2012-12-30 (×2): 60 mg via ORAL
  Filled 2012-12-29 (×2): qty 3

## 2012-12-29 MED ORDER — ISOSORBIDE MONONITRATE ER 30 MG PO TB24: 30.0000 mg | ORAL_TABLET | Freq: Every day | ORAL | Status: DC

## 2012-12-29 MED ORDER — LISINOPRIL 10 MG PO TABS: 10.0000 mg | ORAL_TABLET | Freq: Every day | ORAL | Status: DC

## 2012-12-29 MED ORDER — FUROSEMIDE 20 MG PO TABS: 20.0000 mg | ORAL_TABLET | Freq: Every day | ORAL | Status: DC

## 2012-12-29 MED ORDER — PREDNISONE 10 MG PO TABS: ORAL_TABLET | ORAL | Status: DC

## 2012-12-29 MED ORDER — CARVEDILOL 6.25 MG PO TABS: 6.2500 mg | ORAL_TABLET | Freq: Two times a day (BID) | ORAL | Status: DC

## 2012-12-29 NOTE — Discharge Summary (Signed)
Physician Discharge Summary  Todd Page OZH:[id number] DOB: Sep 02, 1951 DOA: 12/24/2012  PCP: Joni Reining, NP  Admit date: 12/24/2012 Discharge date: 12/29/2012  Time spent: 30 minutes  Recommendations for Outpatient Follow-up:  1. Patient has been referred for an outpatient sleep study 2. Patient will followup with [place] [facility]ology as outpatient-appointment has been scheduled  Discharge Diagnoses:  Principal Problem:   Atrial fibrillation with rapid ventricular response Active Problems:   Obesity   Schizoaffective disorder   Malignant hypertension   Dyspnea   Bilateral leg pain   Hypokalemia   Acute on chronic systolic HF (heart failure)   Discharge Condition: Improved, being discharged home  Diet recommendation: Low sodium heart healthy  Filed Weights   12/25/12 0500 12/27/12 0419 12/28/12 0547  Weight: 136.6 kg (301 lb 2.4 oz) 135.7 kg (299 lb 2.6 oz) 136 kg (299 lb 13.2 oz)    History of present illness:  Todd Page is a 62 y.o. male with past medical hx of afib not on anticoagulation due to non-compliance, HTN, systolic heart dysfunction, obesity, schizoaffective disorder who presents to [facility] ED with cc shortness of breath. Information obtained from patient. States he has been in usual state of health until this am when he awakened with a nose bleed and congestion. He quickly developed shortness of breath with exertion. Pt is not on nebs or oxygen at home. Associated symptoms include dizziness and dry cough and bilateral LE pain. He reports LE pain with weight bearing and believes it is gout. He denies worsening LEE. He denies CP/palpitation, headache, nausea, vomiting, abdominal pain, dysuria, hematuria, diarrhea or melena. Denies fever, chills or recent sick contacts. Reports not taking any of his medicines "in quite some time" because they were "lost" and could not replace due to finances. Denies history of asthma, COPD. Symptoms came on suddenly, have persisted and  characterized as severe. Work up in ED yields afib with RVR , HTN.   Hospital Course:  Atrial fibrillation with rapid ventricular response: hx of same. Likely related to non compliance and exacerbated by pulmonary issues. Cardizem dose was recently increased. Heart rate is stable for the last 24 hours. Not on anticoagulation due to non compliance. By time of discharge, heart rate stable in the 70s.  Active Problems:  Acute bronchitis. Patient's breathing much improved. With wheezing and shortness of breath resolved. By day prior to discharge, patient was able to start ambulating in the hallway on room air with sats of 91%.  Chest xray does indicate edema or infiltrate. Likely related to bronchitis. Antibiotics were changed over to by mouth and he will continue on 2 more days of this. In addition Cymetra was changed over to by mouth prednisone for a quick 5 day taper.  Acute on chronic systolic CHF: likely medication non-compliance. Echo in 9/13 yields EF 45% with severe hypokinesis and moderate LVH. Increased wheezing and increased work of breathing. Volume status -4.9L. continue nebs. On aggressive diuresis, Lasix was held as BM started to trend up. The patient will be discharged and 20 mg of by mouth Lasix.  Hypokalemia: likely related to diuresis. Resolved.   Malignant hypertension: Only fair control. Lisinopril increased on 2/28 Continue lasix and cardizem. Monitor closely. By day of discharge, blood pressure down to 135/90  Obesity: BMI 42.1. Nutritional consult   Schizoaffective disorder: appears at baseline.   Bilateral leg pain: hx of gout. Improved today.   Procedures:  None  Consultations:  None  Discharge Exam: Filed Vitals:  12/29/12 0029 12/29/12 0409 12/29/12 0551 12/29/12 0736  BP:  158/94 135/90   Pulse:   79   Temp:   97.4 F (36.3 C)   TempSrc:      Resp:   22   Height:      Weight:      SpO2: 89%  90% 88%    General: Alert and oriented x3, no acute  distress Cardiovascular: Irregular rhythm, rate controlled Respiratory: Decreased breath sounds throughout, otherwise clear Abdomen: Soft, obese, nontender, positive bowel sounds Extremities: No clubbing or cyanosis, trace edema  Discharge Instructions  Discharge Orders   Future Appointments Provider Department Dept Phone   01/13/2013 1:00 PM Jodelle Gross, NP [facility] [place] [phone]   Future Orders Complete By Expires     Diet - low sodium heart healthy  As directed     Increase activity slowly  As directed         Medication List    TAKE these medications       ALPRAZolam 0.25 MG tablet  Commonly known as:  XANAX  Take 0.25 mg by mouth 3 (three) times daily as needed for sleep.     aspirin 81 MG tablet  Take 81 mg by mouth daily.     carvedilol 6.25 MG tablet  Commonly known as:  COREG  Take 1 tablet (6.25 mg total) by mouth 2 (two) times daily with a meal.     diltiazem 240 MG 24 hr tablet  Commonly known as:  CARDIZEM LA  Take 1 tablet (240 mg total) by mouth at bedtime. For treatment of your elevated heart rate and high blood pressure.     furosemide 20 MG tablet  Commonly known as:  LASIX  Take 1 tablet (20 mg total) by mouth daily.     isosorbide mononitrate 30 MG 24 hr tablet  Commonly known as:  IMDUR  Take 1 tablet (30 mg total) by mouth daily.     lisinopril 10 MG tablet  Commonly known as:  PRINIVIL,ZESTRIL  Take 1 tablet (10 mg total) by mouth daily. For treatment of your high blood pressure and your heart.     potassium chloride SA 20 MEQ tablet  Commonly known as:  K-DUR,KLOR-CON  Take 1 tablet (20 mEq total) by mouth daily.     predniSONE 10 MG tablet  Commonly known as:  DELTASONE  50 mg (5 tabs) po daily x 1 day, then 40 mg on day 2, then 30mg  on day 3, then 20mg  on day 4, then 10 mg on day 5 and stop.           Follow-up Information   Follow up with [facility] [place] On 01/13/2013. (Your appointment  is at 1:00 pm with [place] [facility])    Contact information:   [address] [place] [address] [phone]       The results of significant diagnostics from this hospitalization (including imaging, microbiology, ancillary and laboratory) are listed below for reference.    Significant Diagnostic Studies: Dg Chest 2 View  12/24/2012    IMPRESSION: Cardiomegaly without acute disease.   Original Report Authenticated By: Holley Dexter, M.D.    Dg Chest Port 1 View  12/26/2012   IMPRESSION: Slight peribronchial thickening suggesting bronchitis.   Original Report Authenticated By: [name], M.D.     Microbiology: Recent Results (from the past 240 hour(s))  MRSA PCR SCREENING     Status: None   Collection Time  12/24/12  2:34 PM      Result Value Range Status   MRSA by PCR NEGATIVE  NEGATIVE Final   Comment:            The GeneXpert MRSA Assay (FDA     approved for NASAL specimens     only), is one component of a     comprehensive MRSA colonization     surveillance program. It is not     intended to diagnose MRSA     infection nor to guide or     monitor treatment for     MRSA infections.     Labs: Basic Metabolic Panel:  Recent Labs Lab 12/24/12 1009 12/25/12 0213 12/26/12 0505 12/27/12 0544 12/29/12 0459  NA 135 137 137 137 135  K 3.2* 3.0* 3.7 3.8 3.6  CL 99 99 98 96 97  CO2 29 27 28 29 25   GLUCOSE 98 136* 100* 152* 144*  BUN 10 15 20  24* 32*  CREATININE 1.19 1.22 1.27 1.28 1.25  CALCIUM 9.2 9.0 9.3 9.3 9.4  MG  --  2.0  --   --  2.4   CBC:  Recent Labs Lab 12/24/12 1009 12/25/12 0213 12/26/12 0505 12/27/12 0544  WBC 5.1 4.3 8.5 5.5  NEUTROABS 3.5  --   --   --   HGB 14.9 14.0 14.7 14.7  HCT 44.6 41.7 44.1 44.1  MCV 81.1 80.7 82.1 81.1  PLT 191 204 199 228   Cardiac Enzymes:  Recent Labs Lab 12/24/12 1420 12/24/12 2030 12/25/12 0213  TROPONINI <0.30 <0.30 <0.30   BNP: BNP (last 3 results)  Recent Labs  11/10/12 2123  12/24/12 1150 12/26/12 1016  PROBNP 390.4* 1207.0* 223.3*     Signed:  KRISHNAN,SENDIL K  [name] 12/29/2012, 2:43 PM

## 2012-12-29 NOTE — Progress Notes (Signed)
Patient d/c home with instructions and verbalizes understanding eating at this time and awaiting ride home. IV cath removed with catheter intact. No swelling or discomfort at site.

## 2012-12-30 NOTE — Progress Notes (Signed)
Patient given cardizem PO that was due at noon. Had refused medication since this AM. Explained to patient that it was important to take medication as ordered. Patient could not get a ride. Mona approved a cab ride home and SW arranged. Patient taken out of facility by NT in wheelchair to cab at ED entrance.

## 2012-12-30 NOTE — Progress Notes (Signed)
Patient bp is 164/103.  MD notified.  Orders given and followed.

## 2012-12-30 NOTE — Clinical Social Work Note (Signed)
CSW arranged transport for pt home via [employer] as authorized by administration. Pt confirmed address.  [place], [place] [phone]

## 2013-01-13 ENCOUNTER — Encounter: Payer: Medicare Other | Admitting: Adult Health

## 2013-01-16 ENCOUNTER — Encounter (HOSPITAL_COMMUNITY): Payer: Self-pay | Admitting: *Deleted

## 2013-01-16 ENCOUNTER — Emergency Department (HOSPITAL_COMMUNITY)
Admission: EM | Admit: 2013-01-16 | Discharge: 2013-01-16 | Disposition: A | Discharge status: Home / Self Care | Payer: Medicare Other | Attending: Emergency Medicine | Admitting: Emergency Medicine

## 2013-01-16 ENCOUNTER — Emergency Department (HOSPITAL_COMMUNITY): Payer: Medicare Other

## 2013-01-16 DIAGNOSIS — Z91199 Patient's noncompliance with other medical treatment and regimen due to unspecified reason: Secondary | ICD-10-CM | POA: Insufficient documentation

## 2013-01-16 DIAGNOSIS — Z8719 Personal history of other diseases of the digestive system: Secondary | ICD-10-CM | POA: Insufficient documentation

## 2013-01-16 DIAGNOSIS — R000 Tachycardia, unspecified: Secondary | ICD-10-CM | POA: Insufficient documentation

## 2013-01-16 DIAGNOSIS — I4891 Unspecified atrial fibrillation: Secondary | ICD-10-CM

## 2013-01-16 DIAGNOSIS — R21 Rash and other nonspecific skin eruption: Secondary | ICD-10-CM | POA: Insufficient documentation

## 2013-01-16 DIAGNOSIS — Z862 Personal history of diseases of the blood and blood-forming organs and certain disorders involving the immune mechanism: Secondary | ICD-10-CM | POA: Insufficient documentation

## 2013-01-16 DIAGNOSIS — Z79899 Other long term (current) drug therapy: Secondary | ICD-10-CM | POA: Insufficient documentation

## 2013-01-16 DIAGNOSIS — I10 Essential (primary) hypertension: Secondary | ICD-10-CM | POA: Insufficient documentation

## 2013-01-16 DIAGNOSIS — Z8639 Personal history of other endocrine, nutritional and metabolic disease: Secondary | ICD-10-CM | POA: Insufficient documentation

## 2013-01-16 DIAGNOSIS — F209 Schizophrenia, unspecified: Secondary | ICD-10-CM | POA: Insufficient documentation

## 2013-01-16 DIAGNOSIS — R059 Cough, unspecified: Secondary | ICD-10-CM | POA: Insufficient documentation

## 2013-01-16 DIAGNOSIS — Z9889 Other specified postprocedural states: Secondary | ICD-10-CM | POA: Insufficient documentation

## 2013-01-16 DIAGNOSIS — R05 Cough: Secondary | ICD-10-CM | POA: Insufficient documentation

## 2013-01-16 DIAGNOSIS — Z7982 Long term (current) use of aspirin: Secondary | ICD-10-CM | POA: Insufficient documentation

## 2013-01-16 DIAGNOSIS — Z9119 Patient's noncompliance with other medical treatment and regimen: Secondary | ICD-10-CM | POA: Insufficient documentation

## 2013-01-16 DIAGNOSIS — R062 Wheezing: Secondary | ICD-10-CM | POA: Insufficient documentation

## 2013-01-16 DIAGNOSIS — Z8679 Personal history of other diseases of the circulatory system: Secondary | ICD-10-CM | POA: Insufficient documentation

## 2013-01-16 DIAGNOSIS — I5023 Acute on chronic systolic (congestive) heart failure: Secondary | ICD-10-CM | POA: Insufficient documentation

## 2013-01-16 LAB — COMPREHENSIVE METABOLIC PANEL
AST: 22 U/L (ref 0–37)
Albumin: 3.6 g/dL (ref 3.5–5.2)
Alkaline Phosphatase: 75 U/L (ref 39–117)
BUN: 17 mg/dL (ref 6–23)
Chloride: 103 mEq/L (ref 96–112)
Creatinine, Ser: 1.23 mg/dL (ref 0.50–1.35)
GFR calc Af Amer: 72 mL/min — ABNORMAL LOW (ref >90)
GFR calc non Af Amer: 62 mL/min — ABNORMAL LOW (ref >90)
Potassium: 3.8 mEq/L (ref 3.5–5.1)
Total Bilirubin: 0.5 mg/dL (ref 0.3–1.2)
Total Protein: 6.9 g/dL (ref 6.0–8.3)

## 2013-01-16 LAB — CBC WITH DIFFERENTIAL/PLATELET
Basophils Absolute: 0.1 10*3/uL (ref 0.0–0.1)
Basophils Relative: 1 % (ref 0–1)
Eosinophils Absolute: 0.3 10*3/uL (ref 0.0–0.7)
Hemoglobin: 15.1 g/dL (ref 13.0–17.0)
MCH: 26.9 pg (ref 26.0–34.0)
MCHC: 32.7 g/dL (ref 30.0–36.0)
Monocytes Relative: 8 % (ref 3–12)
Neutro Abs: 4.5 10*3/uL (ref 1.7–7.7)
Neutrophils Relative %: 64 % (ref 43–77)
RDW: 15.2 % (ref 11.5–15.5)

## 2013-01-16 LAB — PROTIME-INR: INR: 1 (ref 0.00–1.49)

## 2013-01-16 LAB — PRO B NATRIURETIC PEPTIDE: Pro B Natriuretic peptide (BNP): 815.6 pg/mL — ABNORMAL HIGH (ref 0–125)

## 2013-01-16 MED ORDER — FUROSEMIDE 10 MG/ML IJ SOLN
40.0000 mg | Freq: Once | INTRAMUSCULAR | Status: AC
  Administered 2013-01-16: 40 mg via INTRAVENOUS
  Filled 2013-01-16: qty 4

## 2013-01-16 MED ORDER — PREDNISONE 50 MG PO TABS
60.0000 mg | ORAL_TABLET | Freq: Once | ORAL | Status: AC
  Administered 2013-01-16: 60 mg via ORAL
  Filled 2013-01-16: qty 1

## 2013-01-16 MED ORDER — ALBUTEROL SULFATE (5 MG/ML) 0.5% IN NEBU
5.0000 mg | INHALATION_SOLUTION | Freq: Once | RESPIRATORY_TRACT | Status: AC
  Administered 2013-01-16: 5 mg via RESPIRATORY_TRACT
  Filled 2013-01-16: qty 1

## 2013-01-16 MED ORDER — IPRATROPIUM BROMIDE 0.02 % IN SOLN
0.5000 mg | Freq: Once | RESPIRATORY_TRACT | Status: AC
  Administered 2013-01-16: 0.5 mg via RESPIRATORY_TRACT
  Filled 2013-01-16: qty 2.5

## 2013-01-16 NOTE — ED Notes (Addendum)
Called in Lisinopril 10mg  PO daily Rx per Dr. Fonnie Jarvis to [facility] pharmacy.  They also have all his medications that were filled by e-script upon d/c on 12/29/2012.  They stated they had called patient's home and left message that they were ready after not being picked up by 01/09/13; however, the home number listed was incorrect.  Supplied correct phone number to pharmacy.  Instructed patient that he is to go to [facility] today to pick up his prescriptions and to start them today.  He states, "I will if I have the money."  Patient indicates he does not have anyone who can transport him home today.  Called [facility] to inquire about picking patient up.  They will check their schedule and call back.

## 2013-01-16 NOTE — ED Notes (Signed)
SpO2 91-97% on room air, ambulating unit.  HR 120-141 upon return to room.

## 2013-01-16 NOTE — ED Notes (Signed)
Patient denies having been prescribed any medications at last d/c from hospital.  Also admits to drinking "alot of fluids."  States he can drink "a gallon of tea at one sitting."  Informed him that he could only have a sip of water at present because extra fluid may be contributing to his SOB.  Patient does not seem to fully understand importance of taking medications or decreasing fluid intake.

## 2013-01-16 NOTE — ED Provider Notes (Signed)
History    This chart was scribed for Todd Horn, MD by Charolett Bumpers, ED Scribe. The patient was seen in room APA04/APA04. Patient's care was started at 0732.   CSN: [id number]  Arrival date & time 01/16/13  0720   First MD Initiated Contact with Patient 01/16/13 0732      Chief Complaint  Patient presents with  . Shortness of Breath  . Rash   The history is provided by the patient. No language interpreter was used.   Todd Page is a 62 y.o. male who has a h/o a-fib, HTN, CHF and schizoaffective disorder, presents to the Emergency Department via EMS complaining of intermittent, moderate SOB with a non-productive cough for the past couple of days. He was recently admitted on 12/24/12 where he received breathing treatments and steroids. He reports over the last couple of weeks, he has had improvement until a few days ago. He states that minimal activity makes him SOB over the past few days. He also reports a few days of a mild rash on his chest that is not symptomatic. He denies any chest pain, abdominal pain, confusion, nausea, vomiting. He denies any recent significant weight gain or leg swelling. He denies any h/o tobacco use or emphysema. He is not on Coumadin. Per nurse, pt has not taken any medications since his last hospital stay.   Past Medical History  Diagnosis Date  . Hypertension     minimal coronary atherosclerosis by CT  . Schizoaffective disorder     With depression  . Atrial fibrillation 06/2012    onset 06/2012; normal TSH; EF-45%  . Overweight   . Gout   . Noncompliance 09/21/2012  . Cholelithiasis     incidental finding on CT in 2013  . Systolic dysfunction     Past Surgical History  Procedure Laterality Date  . Cardiac surgery      ? history accurate; no apparent incisions  . Colonoscopy  2006    normal screening examination    Family History  Problem Relation Age of Onset  . Hypertension Mother   . Liver disease Father     History   Substance Use Topics  . Smoking status: Never Smoker   . Smokeless tobacco: Not on file  . Alcohol Use: Yes     Comment: former       Review of Systems A complete 10 system review of systems was obtained and all systems are negative except as noted in the HPI and PMH.   Allergies  Review of patient's allergies indicates no known allergies.  Home Medications   Current Outpatient Rx  Name  Route  Sig  Dispense  Refill  . ALPRAZolam (XANAX) 0.25 MG tablet   Oral   Take 0.25 mg by mouth 3 (three) times daily as needed for sleep.         Marland Kitchen aspirin 81 MG tablet   Oral   Take 81 mg by mouth daily.         Marland Kitchen diltiazem (CARDIZEM LA) 240 MG 24 hr tablet   Oral   Take 1 tablet (240 mg total) by mouth at bedtime. For treatment of your elevated heart rate and high blood pressure.   30 tablet   0   . predniSONE (DELTASONE) 10 MG tablet      50 mg (5 tabs) po daily x 1 day, then 40 mg on day 2, then 30mg  on day 3, then 20mg  on day 4, then 10  mg on day 5 and stop.   15 tablet   0   . carvedilol (COREG) 6.25 MG tablet   Oral   Take 1 tablet (6.25 mg total) by mouth 2 (two) times daily with a meal.   60 tablet   0   . furosemide (LASIX) 20 MG tablet   Oral   Take 1 tablet (20 mg total) by mouth daily.   30 tablet   0   . isosorbide mononitrate (IMDUR) 30 MG 24 hr tablet   Oral   Take 1 tablet (30 mg total) by mouth daily.   30 tablet   0   . lisinopril (PRINIVIL,ZESTRIL) 10 MG tablet   Oral   Take 1 tablet (10 mg total) by mouth daily. For treatment of your high blood pressure and your heart.   30 tablet   0   . potassium chloride SA (K-DUR,KLOR-CON) 20 MEQ tablet   Oral   Take 1 tablet (20 mEq total) by mouth daily.   30 tablet   0     BP 160/113  Pulse 54  Temp(Src) 98.9 F (37.2 C) (Axillary)  Resp 22  SpO2 96%  Physical Exam  Nursing note and vitals reviewed. Constitutional:  Awake, alert, nontoxic appearance.  HENT:  Head: Atraumatic.   Mouth/Throat: Mucous membranes are normal.  Eyes: Right eye exhibits no discharge. Left eye exhibits no discharge.  Neck: Neck supple.  Cardiovascular: Normal heart sounds.  An irregularly irregular rhythm present. Tachycardia present.   No murmur heard. Pulmonary/Chest: Effort normal. No respiratory distress. He has wheezes. He exhibits no tenderness.  Decreased air movement throughout with faint wheezing with forced exhalation. No crackles or rhonchi.   Abdominal: Soft. There is no tenderness. There is no rebound.  Musculoskeletal: He exhibits no tenderness.  Baseline ROM, no obvious new focal weakness.  Neurological:  Mental status and motor strength appears baseline for patient and situation.  Skin: No rash noted.  Scattered folliculitis across anterior chest without cellulitis.    Psychiatric: He has a normal mood and affect.    ED Course  Procedures (including critical care time)  ECG: History fibrillation, rapid ventricular response, ventricular rate 112, normal axis, no acute ischemic changes noted, compared with 12/29/2012 rate is now faster  DIAGNOSTIC STUDIES: Oxygen Saturation is 97% on 3 L [place], adequate by my interpretation.    COORDINATION OF CARE:  7:37 AM-Patient / Family / Caregiver understand and agree with initial ED impression and plan with expectations set for ED visit. Will order a chest x-ray and blood work.   07:45 AM-Medication Orders: Furosemide (Lasix) injection 40 mg-once; Albuterol (Proventil) (5 mg/mL) 0.5% nebulizer solution 5 mg-once; Ipratropium (Atrovent) nebulizer solution 0.5 mg-once; Prednisone (Deltasone) tablet 60 mg-once.   10:44 AM-Pt stable in ED with no significant deterioration in condition. He states that he has not been taking his medications because the [facility] pharmacy never called him. Nurse will speak with pharmacy to see if his meds are ready. He was taken off of O2 and will be ambulated in ED.   10:56 AM-Recheck: Pt has ambulated  in ED and is now sitting up at bedside. Pulse ox sats on room air are 90-95%. He feels well enough to be discharged home. Scattered, pheliculitis  across anterior chest without cellulitis.   11:04 AM-Nurse spoke with [facility] pharmacy. They states that they tried calling him but he gave them the wrong number. His meds are ready to be picked up.   Results for orders  placed during the hospital encounter of 01/16/13  PRO B NATRIURETIC PEPTIDE      Result Value Range   Pro B Natriuretic peptide (BNP) 815.6 (*) 0 - 125 pg/mL  CBC WITH DIFFERENTIAL      Result Value Range   WBC 7.0  4.0 - 10.5 K/uL   RBC 5.62  4.22 - 5.81 MIL/uL   Hemoglobin 15.1  13.0 - 17.0 g/dL   HCT 40.9  81.1 - 91.4 %   MCV 82.2  78.0 - 100.0 fL   MCH 26.9  26.0 - 34.0 pg   MCHC 32.7  30.0 - 36.0 g/dL   RDW 78.2  95.6 - 21.3 %   Platelets 201  150 - 400 K/uL   Neutrophils Relative 64  43 - 77 %   Neutro Abs 4.5  1.7 - 7.7 K/uL   Lymphocytes Relative 24  12 - 46 %   Lymphs Abs 1.7  0.7 - 4.0 K/uL   Monocytes Relative 8  3 - 12 %   Monocytes Absolute 0.5  0.1 - 1.0 K/uL   Eosinophils Relative 4  0 - 5 %   Eosinophils Absolute 0.3  0.0 - 0.7 K/uL   Basophils Relative 1  0 - 1 %   Basophils Absolute 0.1  0.0 - 0.1 K/uL  COMPREHENSIVE METABOLIC PANEL      Result Value Range   Sodium 141  135 - 145 mEq/L   Potassium 3.8  3.5 - 5.1 mEq/L   Chloride 103  96 - 112 mEq/L   CO2 29  19 - 32 mEq/L   Glucose, Bld 110 (*) 70 - 99 mg/dL   BUN 17  6 - 23 mg/dL   Creatinine, Ser 0.86  0.50 - 1.35 mg/dL   Calcium 9.1  8.4 - 57.8 mg/dL   Total Protein 6.9  6.0 - 8.3 g/dL   Albumin 3.6  3.5 - 5.2 g/dL   AST 22  0 - 37 U/L   ALT 40  0 - 53 U/L   Alkaline Phosphatase 75  39 - 117 U/L   Total Bilirubin 0.5  0.3 - 1.2 mg/dL   GFR calc non Af Amer 62 (*) >90 mL/min   GFR calc Af Amer 72 (*) >90 mL/min  TROPONIN I      Result Value Range   Troponin I <0.30  <0.30 ng/mL  PROTIME-INR      Result Value Range   Prothrombin Time  13.1  11.6 - 15.2 seconds   INR 1.00  0.00 - 1.49    Dg Chest 2 View  01/16/2013  *RADIOLOGY REPORT*  Clinical Data: Shortness of breath.  High blood pressure.  CHEST - 2 VIEW  Comparison: 12/26/2012 and 12/24/2012.  Findings: Cardiomegaly.  Pulmonary vascular prominence most notable centrally.  Elevated right hemidiaphragm with right middle lobe subsegmental atelectasis.  No segmental consolidation seen separate from these findings.  No gross pneumothorax.  IMPRESSION: Cardiomegaly.  Pulmonary vascular prominence most notable centrally.  Elevated right hemidiaphragm with right middle lobe subsegmental atelectasis.   Original Report Authenticated By: Lacy Duverney, M.D.      1. Acute on chronic systolic HF (heart failure)   2. Atrial fibrillation with rapid ventricular response   3. Noncompliance       MDM  I personally performed the services described in this documentation, which was scribed in my presence. The recorded information has been reviewed and is accurate.  I doubt any other EMC precluding discharge at this  time including, but not necessarily limited to the following:ACS, SBI.      Todd Horn, MD 01/16/13 [phone]

## 2013-01-16 NOTE — ED Notes (Signed)
Increased sob that started yesterday with nonproductive cough.  Also reporting rash across top of chest.  Denies pain.  EMS states pt sats en route on RA 90%.  Pt 100% on RA at this time.  EMS reports hypertensive en route.

## 2013-01-16 NOTE — ED Notes (Signed)
Patient snoring sleeping.

## 2013-01-16 NOTE — ED Notes (Signed)
Patient having exacerbation of baseline SOB since yesterday.  No home O2 use, nebs or Inhalers.  Has not followed up with [facility] cardiology since d/c from hospital.  Will not give reason why he chose not to f/u.   He relates he has not taken meds for HTN or afib in years. Also has diffusely scattered erythematous, non-puritic, papular rash over chest, upper arms and upper back which started last week.  RLE is erythematous w/scabbed linear scratches over tibial area.

## 2013-01-16 NOTE — ED Notes (Signed)
[facility] coming to pick patient up.  Patient with no complaints at this time. Respirations even and unlabored. Skin warm/dry. Discharge instructions reviewed with patient at this time. Patient given opportunity to voice concerns/ask questions. IV removed per policy and band-aid applied to site. Patient discharged at this time and left Emergency Department with steady gait.

## 2013-01-18 ENCOUNTER — Emergency Department (HOSPITAL_COMMUNITY): Payer: Medicare Other

## 2013-01-18 ENCOUNTER — Inpatient Hospital Stay (HOSPITAL_COMMUNITY)
Admission: EM | Admit: 2013-01-18 | Discharge: 2013-01-21 | DRG: 308 | Disposition: A | Discharge status: Home Health | Payer: Medicare Other | Attending: Internal Medicine | Admitting: Internal Medicine

## 2013-01-18 ENCOUNTER — Encounter (HOSPITAL_COMMUNITY): Payer: Self-pay

## 2013-01-18 DIAGNOSIS — E876 Hypokalemia: Secondary | ICD-10-CM | POA: Diagnosis present

## 2013-01-18 DIAGNOSIS — I10 Essential (primary) hypertension: Secondary | ICD-10-CM | POA: Diagnosis present

## 2013-01-18 DIAGNOSIS — I509 Heart failure, unspecified: Secondary | ICD-10-CM | POA: Diagnosis present

## 2013-01-18 DIAGNOSIS — Z91199 Patient's noncompliance with other medical treatment and regimen due to unspecified reason: Secondary | ICD-10-CM

## 2013-01-18 DIAGNOSIS — I5023 Acute on chronic systolic (congestive) heart failure: Secondary | ICD-10-CM

## 2013-01-18 DIAGNOSIS — Z79899 Other long term (current) drug therapy: Secondary | ICD-10-CM

## 2013-01-18 DIAGNOSIS — F259 Schizoaffective disorder, unspecified: Secondary | ICD-10-CM | POA: Diagnosis present

## 2013-01-18 DIAGNOSIS — Z6841 Body Mass Index (BMI) 40.0 and over, adult: Secondary | ICD-10-CM

## 2013-01-18 DIAGNOSIS — I4891 Unspecified atrial fibrillation: Secondary | ICD-10-CM

## 2013-01-18 DIAGNOSIS — L738 Other specified follicular disorders: Secondary | ICD-10-CM | POA: Diagnosis present

## 2013-01-18 DIAGNOSIS — R0600 Dyspnea, unspecified: Secondary | ICD-10-CM | POA: Diagnosis present

## 2013-01-18 DIAGNOSIS — E669 Obesity, unspecified: Secondary | ICD-10-CM | POA: Diagnosis present

## 2013-01-18 DIAGNOSIS — Z9119 Patient's noncompliance with other medical treatment and regimen: Secondary | ICD-10-CM

## 2013-01-18 DIAGNOSIS — M109 Gout, unspecified: Secondary | ICD-10-CM | POA: Diagnosis present

## 2013-01-18 LAB — CBC
HCT: 42.4 % (ref 39.0–52.0)
MCH: 27.4 pg (ref 26.0–34.0)
MCHC: 33.3 g/dL (ref 30.0–36.0)
MCV: 82.5 fL (ref 78.0–100.0)
RDW: 15.4 % (ref 11.5–15.5)

## 2013-01-18 LAB — TROPONIN I: Troponin I: <0.3 ng/mL (ref <0.30)

## 2013-01-18 LAB — BASIC METABOLIC PANEL
BUN: 17 mg/dL (ref 6–23)
Calcium: 9 mg/dL (ref 8.4–10.5)
Creatinine, Ser: 1.28 mg/dL (ref 0.50–1.35)
GFR calc Af Amer: 68 mL/min — ABNORMAL LOW (ref >90)
GFR calc non Af Amer: 59 mL/min — ABNORMAL LOW (ref >90)

## 2013-01-18 LAB — POCT I-STAT TROPONIN I: Troponin i, poc: 0.01 ng/mL (ref 0.00–0.08)

## 2013-01-18 MED ORDER — SODIUM CHLORIDE 0.9 % IJ SOLN: 3.0000 mL | INTRAMUSCULAR | Status: DC | PRN

## 2013-01-18 MED ORDER — CARVEDILOL 3.125 MG PO TABS
6.2500 mg | ORAL_TABLET | Freq: Two times a day (BID) | ORAL | Status: DC
  Administered 2013-01-18 – 2013-01-19 (×3): 6.25 mg via ORAL
  Filled 2013-01-18 (×3): qty 2

## 2013-01-18 MED ORDER — FUROSEMIDE 10 MG/ML IJ SOLN
40.0000 mg | Freq: Two times a day (BID) | INTRAMUSCULAR | Status: DC
  Administered 2013-01-18 – 2013-01-21 (×7): 40 mg via INTRAVENOUS
  Filled 2013-01-18 (×7): qty 4

## 2013-01-18 MED ORDER — ONDANSETRON HCL 4 MG/2ML IJ SOLN: 4.0000 mg | INTRAMUSCULAR | Status: DC | PRN

## 2013-01-18 MED ORDER — POTASSIUM CHLORIDE CRYS ER 20 MEQ PO TBCR
40.0000 meq | EXTENDED_RELEASE_TABLET | Freq: Two times a day (BID) | ORAL | Status: DC
  Administered 2013-01-18 – 2013-01-21 (×7): 40 meq via ORAL
  Filled 2013-01-18: qty 2
  Filled 2013-01-18: qty 1
  Filled 2013-01-18: qty 2
  Filled 2013-01-18 (×2): qty 1
  Filled 2013-01-18 (×3): qty 2
  Filled 2013-01-18: qty 1

## 2013-01-18 MED ORDER — ISOSORBIDE MONONITRATE ER 60 MG PO TB24
30.0000 mg | ORAL_TABLET | Freq: Every day | ORAL | Status: DC
  Administered 2013-01-18 – 2013-01-21 (×4): 30 mg via ORAL
  Filled 2013-01-18 (×4): qty 1

## 2013-01-18 MED ORDER — DILTIAZEM HCL ER COATED BEADS 240 MG PO TB24
240.0000 mg | ORAL_TABLET | Freq: Every day | ORAL | Status: DC
  Filled 2013-01-18: qty 1

## 2013-01-18 MED ORDER — SODIUM CHLORIDE 0.9 % IV SOLN
INTRAVENOUS | Status: AC
  Administered 2013-01-18: 06:00:00 via INTRAVENOUS

## 2013-01-18 MED ORDER — ENOXAPARIN SODIUM 80 MG/0.8ML ~~LOC~~ SOLN
70.0000 mg | SUBCUTANEOUS | Status: DC
  Administered 2013-01-19 – 2013-01-21 (×3): 70 mg via SUBCUTANEOUS
  Filled 2013-01-18 (×3): qty 0.8

## 2013-01-18 MED ORDER — LISINOPRIL 10 MG PO TABS
10.0000 mg | ORAL_TABLET | Freq: Every day | ORAL | Status: DC
  Administered 2013-01-18 – 2013-01-21 (×4): 10 mg via ORAL
  Filled 2013-01-18 (×4): qty 1

## 2013-01-18 MED ORDER — MUPIROCIN CALCIUM 2 % EX CREA
TOPICAL_CREAM | Freq: Two times a day (BID) | CUTANEOUS | Status: DC
  Administered 2013-01-18 – 2013-01-21 (×7): via TOPICAL
  Filled 2013-01-18: qty 15

## 2013-01-18 MED ORDER — DILTIAZEM HCL ER COATED BEADS 240 MG PO CP24
240.0000 mg | ORAL_CAPSULE | Freq: Every day | ORAL | Status: DC
  Administered 2013-01-18 – 2013-01-20 (×3): 240 mg via ORAL
  Filled 2013-01-18 (×6): qty 1

## 2013-01-18 MED ORDER — POTASSIUM CHLORIDE CRYS ER 20 MEQ PO TBCR
60.0000 meq | EXTENDED_RELEASE_TABLET | Freq: Once | ORAL | Status: AC
  Administered 2013-01-18: 60 meq via ORAL
  Filled 2013-01-18: qty 3

## 2013-01-18 MED ORDER — SODIUM CHLORIDE 0.9 % IJ SOLN: 3.0000 mL | Freq: Two times a day (BID) | INTRAMUSCULAR | Status: DC

## 2013-01-18 MED ORDER — DILTIAZEM HCL 30 MG PO TABS
ORAL_TABLET | ORAL | Status: AC
  Administered 2013-01-18: 120 mg
  Filled 2013-01-18: qty 4

## 2013-01-18 MED ORDER — ENOXAPARIN SODIUM 40 MG/0.4ML ~~LOC~~ SOLN
40.0000 mg | SUBCUTANEOUS | Status: DC
  Administered 2013-01-18: 40 mg via SUBCUTANEOUS
  Filled 2013-01-18: qty 0.4

## 2013-01-18 MED ORDER — SODIUM CHLORIDE 0.9 % IV SOLN: 250.0000 mL | INTRAVENOUS | Status: DC | PRN

## 2013-01-18 MED ORDER — DILTIAZEM HCL 100 MG IV SOLR
5.0000 mg/h | INTRAVENOUS | Status: DC
  Administered 2013-01-18: 10 mg/h via INTRAVENOUS

## 2013-01-18 MED ORDER — DILTIAZEM HCL 100 MG IV SOLR
5.0000 mg/h | INTRAVENOUS | Status: DC
  Filled 2013-01-18: qty 100

## 2013-01-18 MED ORDER — ALPRAZOLAM 0.25 MG PO TABS
0.2500 mg | ORAL_TABLET | Freq: Three times a day (TID) | ORAL | Status: DC | PRN
  Administered 2013-01-18 – 2013-01-19 (×2): 0.25 mg via ORAL
  Filled 2013-01-18 (×2): qty 1

## 2013-01-18 MED ORDER — ACETAMINOPHEN 325 MG PO TABS
650.0000 mg | ORAL_TABLET | ORAL | Status: DC | PRN
  Administered 2013-01-19: 650 mg via ORAL
  Filled 2013-01-18: qty 2

## 2013-01-18 NOTE — ED Notes (Signed)
Sob that started tonight when he stepped outside of his house and thinks he smelled plastic burning.  Pt denies cp or other complaints

## 2013-01-18 NOTE — Progress Notes (Signed)
[name] PROGRESS NOTE  Todd Page ZOX:[id number] DOB: Jan 11, 1951 DOA: 01/18/2013 PCP: No primary provider on file.  Assessment: 1. Atrial fibrillation with rapid ventricular response: Likely secondary to noncompliance. No signs of ACS. Not on anticoagulation secondary to noncompliance. Followup cardiac enzymes. 2. Acute/chronic systolic heart failure: IV Lasix. ACE inhibitor, continue oral nitrates. Secondary to noncompliance. Echocardiogram 06/2012: Left ventricular ejection fraction 45-50%, severe hypokinesis of the basal mid inferolateral myocardium. Not severely decompensated, continue beta blocker.  3. Hypokalemia: Replete.  4. Folliculitis: No evidence of superinfection or cellulitis. No exposure to pools or hot tubs. 5. Schizoaffective disorder: Appears stable. Continue Xanax 6. Noncompliance: Multiple admissions for atrial fibrillation with rapid ventricular response secondary to noncompliance. No PCP. Consult case management. Again encouraged to followup with cardiology as an outpatient.  Plan: 1. IV Lasix, strict I/O. 2. Resume oral rate control agents.  3. ROMI 4. Warm compresses, mupirocin  Code Status: Full code  Family Communication: None present  Disposition Plan: Home when improved   Brendia Sacks, MD  [name]  Pager [phone] If 7PM-7AM, please contact night-coverage at [email or web address], password [facility] 01/18/2013, 7:33 AM  LOS: 0 days   Brief narrative: 62 year old man PMH afib, non-compliance, systolic CHF with recurrent admissions for afib RVR & non-compliance presented with shortness of breath. In the emergency department was noted to have atrial fibrillation with rapid ventricular response and had shortness of breath.  Consultants:    Procedures:     HPI/Subjective: Overall feels okay. Shortness of breath continues. He does report that he is short of breath with exertion at baseline. Denies smoking. Reports rash on chest, not pruritic, no  pain, present perhaps one week. Did wear a new shirt without first washing it. Does not bathe daily.  Objective: Filed Vitals:   01/18/13 0521 01/18/13 0603 01/18/13 0629 01/18/13 0700  BP: 153/100 147/77 147/77 153/101  Pulse: 99  92 89  Temp:   98.5 F (36.9 C) 99 F (37.2 C)  TempSrc:   Oral Axillary  Resp: 19  21 25   Height:    6' (1.829 m)  Weight:    140.3 kg (309 lb 4.9 oz)  SpO2: 97%  97% 100%    Intake/Output Summary (Last 24 hours) at 01/18/13 0733 Last data filed at 01/18/13 0700  Gross per 24 hour  Intake 128.33 ml  Output      0 ml  Net 128.33 ml   Filed Weights   01/18/13 0444 01/18/13 0700  Weight: 140.615 kg (310 lb) 140.3 kg (309 lb 4.9 oz)    Exam:  General:  Examined in the ICU. Appears calm, mildly uncomfortable. Short of breath but nontoxic. Eyes: PERRL, normal lids, irises  ENT: grossly normal hearing, lips & tongue Cardiovascular: Irregular, no m/r/g. 2+ bilateral LE edema. Telemetry: Atrial fibrillation, rate 80s Respiratory: CTA bilaterally, no w/r/r. Normal respiratory effort. Abdomen: soft, ntnd Skin: no rash or induration seen on limited exam Musculoskeletal: grossly normal tone BUE/BLE. Able to sit up without assistance or difficulty. Psychiatric: grossly normal mood and affect, speech fluent and appropriate Neurologic: grossly non-focal.  Data Reviewed: Basic Metabolic Panel:  Recent Labs Lab 01/16/13 0758 01/18/13 0427  NA 141 140  K 3.8 3.4*  CL 103 102  CO2 29 29  GLUCOSE 110* 111*  BUN 17 17  CREATININE 1.23 1.28  CALCIUM 9.1 9.0   Liver Function Tests:  Recent Labs Lab 01/16/13 0758  AST 22  ALT 40  ALKPHOS 75  BILITOT 0.5  PROT  6.9  ALBUMIN 3.6   CBC:  Recent Labs Lab 01/16/13 0758 01/18/13 0427  WBC 7.0 5.2  NEUTROABS 4.5  --   HGB 15.1 14.1  HCT 46.2 42.4  MCV 82.2 82.5  PLT 201 194   Cardiac Enzymes:  Recent Labs Lab 01/16/13 0758  TROPONINI <0.30   BNP (last 3 results)  Recent Labs   12/24/12 1150 12/26/12 1016 01/16/13 0758  PROBNP 1207.0* 223.3* 815.6*    Studies: Dg Chest 2 View  01/16/2013  *RADIOLOGY REPORT*  Clinical Data: Shortness of breath.  High blood pressure.  CHEST - 2 VIEW  Comparison: 12/26/2012 and 12/24/2012.  Findings: Cardiomegaly.  Pulmonary vascular prominence most notable centrally.  Elevated right hemidiaphragm with right middle lobe subsegmental atelectasis.  No segmental consolidation seen separate from these findings.  No gross pneumothorax.  IMPRESSION: Cardiomegaly.  Pulmonary vascular prominence most notable centrally.  Elevated right hemidiaphragm with right middle lobe subsegmental atelectasis.   Original Report Authenticated By: Lacy Duverney, M.D.    Dg Chest Portable 1 View  01/18/2013  *RADIOLOGY REPORT*  Clinical Data: Shortness of breath  PORTABLE CHEST - 1 VIEW  Comparison: 01/16/2013  Findings: Cardiomegaly.  Central vascular congestion.  Bibasilar opacities.  Small effusions not excluded.  No pneumothorax.  No acute osseous finding.  IMPRESSION: Similar to prior.  Cardiomegaly with central vascular congestion.  Bibasilar opacities; atelectasis versus infiltrate.   Original Report Authenticated By: Jearld Lesch, M.D.     Scheduled Meds: . carvedilol  6.25 mg Oral BID WC  . diltiazem  240 mg Oral QHS  . enoxaparin (LOVENOX) injection  40 mg Subcutaneous Q24H  . furosemide  40 mg Intravenous Q12H  . isosorbide mononitrate  30 mg Oral Daily  . lisinopril  10 mg Oral Daily  . potassium chloride  40 mEq Oral BID  . sodium chloride  3 mL Intravenous Q12H   Continuous Infusions: . diltiazem (CARDIZEM) infusion      Principal Problem:   Atrial fibrillation with rapid ventricular response Active Problems:   Obesity   Schizoaffective disorder   Noncompliance   Malignant hypertension   Dyspnea   Hypokalemia   Acute on chronic systolic HF (heart failure)     Brendia Sacks, MD  [name] Pager [phone] If  7PM-7AM, please contact night-coverage at [email or web address], password [facility] 01/18/2013, 7:33 AM  LOS: 0 days   Time spent: 25 minutes

## 2013-01-18 NOTE — ED Provider Notes (Signed)
History     CSN: [id number]  Arrival date & time 01/18/13  0430   First MD Initiated Contact with Patient 01/18/13 [phone]      Chief Complaint  Patient presents with  . Shortness of Breath    (Consider location/radiation/quality/duration/timing/severity/associated sxs/prior treatment) HPI History provided by patient. Has history of heart failure, schizophrenia and atrial fibrillation. Patient states onset tonight after walking outside. Patient initially told me that he does take Coumadin on further history after review of past medical history including noncompliance, relates that he does not take it.  Recent admission for heart failure. Patient relates that he is taking medications as prescribed but did not take it last night. No chest pain. No cough or fevers. No new leg swelling. Symptoms moderate in severity. Brought in by EMS Past Medical History  Diagnosis Date  . Hypertension     minimal coronary atherosclerosis by CT  . Schizoaffective disorder     With depression  . Atrial fibrillation 06/2012    onset 06/2012; normal TSH; EF-45%  . Overweight   . Gout   . Noncompliance 09/21/2012  . Cholelithiasis     incidental finding on CT in 2013  . Systolic dysfunction     Past Surgical History  Procedure Laterality Date  . Cardiac surgery      ? history accurate; no apparent incisions  . Colonoscopy  2006    normal screening examination    Family History  Problem Relation Age of Onset  . Hypertension Mother   . Liver disease Father     History  Substance Use Topics  . Smoking status: Never Smoker   . Smokeless tobacco: Not on file  . Alcohol Use: Yes     Comment: former       Review of Systems  Constitutional: Negative for fever and chills.  HENT: Negative for neck pain and neck stiffness.   Eyes: Negative for pain.  Respiratory: Positive for shortness of breath.   Cardiovascular: Negative for chest pain.  Gastrointestinal: Negative for vomiting and abdominal  pain.  Genitourinary: Negative for dysuria.  Musculoskeletal: Negative for back pain.  Skin: Negative for rash.  Neurological: Negative for headaches.  All other systems reviewed and are negative.    Allergies  Review of patient's allergies indicates no known allergies.  Home Medications   Current Outpatient Rx  Name  Route  Sig  Dispense  Refill  . ALPRAZolam (XANAX) 0.25 MG tablet   Oral   Take 0.25 mg by mouth 3 (three) times daily as needed for sleep.         Marland Kitchen aspirin 81 MG tablet   Oral   Take 81 mg by mouth daily.         . carvedilol (COREG) 6.25 MG tablet   Oral   Take 1 tablet (6.25 mg total) by mouth 2 (two) times daily with a meal.   60 tablet   0   . diltiazem (CARDIZEM LA) 240 MG 24 hr tablet   Oral   Take 1 tablet (240 mg total) by mouth at bedtime. For treatment of your elevated heart rate and high blood pressure.   30 tablet   0   . furosemide (LASIX) 20 MG tablet   Oral   Take 1 tablet (20 mg total) by mouth daily.   30 tablet   0   . isosorbide mononitrate (IMDUR) 30 MG 24 hr tablet   Oral   Take 1 tablet (30 mg total) by mouth  daily.   30 tablet   0   . lisinopril (PRINIVIL,ZESTRIL) 10 MG tablet   Oral   Take 1 tablet (10 mg total) by mouth daily. For treatment of your high blood pressure and your heart.   30 tablet   0   . potassium chloride SA (K-DUR,KLOR-CON) 20 MEQ tablet   Oral   Take 1 tablet (20 mEq total) by mouth daily.   30 tablet   0   . predniSONE (DELTASONE) 10 MG tablet      50 mg (5 tabs) po daily x 1 day, then 40 mg on day 2, then 30mg  on day 3, then 20mg  on day 4, then 10 mg on day 5 and stop.   15 tablet   0     BP 153/100  Pulse 99  Temp(Src) 97.8 F (36.6 C) (Oral)  Resp 19  Ht 6' (1.829 m)  Wt 310 lb (140.615 kg)  BMI 42.03 kg/m2  SpO2 97%  Physical Exam  Constitutional: He is oriented to person, place, and time. He appears well-developed and well-nourished.  HENT:  Head: Normocephalic and  atraumatic.  Eyes: EOM are normal. Pupils are equal, round, and reactive to light.  Neck: Neck supple.  Cardiovascular: Intact distal pulses.   Rapid and irregular  Pulmonary/Chest: Breath sounds normal. He exhibits no tenderness.  Tachypnea with mild respiratory distress  Abdominal: Soft. Bowel sounds are normal. He exhibits no distension.  Musculoskeletal: Normal range of motion. He exhibits no tenderness.  1+ pretibial edema  Neurological: He is alert and oriented to person, place, and time.  Skin: Skin is warm and dry.    ED Course  Procedures (including critical care time)  Labs Reviewed  BASIC METABOLIC PANEL - Abnormal; Notable for the following:    Potassium 3.4 (*)    Glucose, Bld 111 (*)    GFR calc non Af Amer 59 (*)    GFR calc Af Amer 68 (*)    All other components within normal limits  CBC  PROTIME-INR  APTT  POCT I-STAT TROPONIN I   Dg Chest 2 View  01/16/2013  *RADIOLOGY REPORT*  Clinical Data: Shortness of breath.  High blood pressure.  CHEST - 2 VIEW  Comparison: 12/26/2012 and 12/24/2012.  Findings: Cardiomegaly.  Pulmonary vascular prominence most notable centrally.  Elevated right hemidiaphragm with right middle lobe subsegmental atelectasis.  No segmental consolidation seen separate from these findings.  No gross pneumothorax.  IMPRESSION: Cardiomegaly.  Pulmonary vascular prominence most notable centrally.  Elevated right hemidiaphragm with right middle lobe subsegmental atelectasis.   Original Report Authenticated By: Lacy Duverney, M.D.    Dg Chest Portable 1 View  01/18/2013  *RADIOLOGY REPORT*  Clinical Data: Shortness of breath  PORTABLE CHEST - 1 VIEW  Comparison: 01/16/2013  Findings: Cardiomegaly.  Central vascular congestion.  Bibasilar opacities.  Small effusions not excluded.  No pneumothorax.  No acute osseous finding.  IMPRESSION: Similar to prior.  Cardiomegaly with central vascular congestion.  Bibasilar opacities; atelectasis versus infiltrate.    Original Report Authenticated By: Jearld Lesch, M.D.     Date: 01/18/2013  Rate: 115  Rhythm: atrial fibrillation  QRS Axis: normal  Intervals: normal  ST/T Wave abnormalities: nonspecific ST changes  Conduction Disutrbances:none  Narrative Interpretation:   Old EKG Reviewed: unchanged   1. Rapid atrial fibrillation    IV diltiazem drip initiated and at a rate of 10 per hour remains with a heart rate around 100  5:38 AM discussed with  Dr. Orvan Falconer on call for [facility] - plan diltiazem by mouth now with potassium and medical admission.   MDM  Dyspnea with rapid atrial fibrillation  Evaluated with EKG, labs and x-ray reviewed as above  Requiring IV diltiazem for rate control  Medical admission to step down ICU         Sunnie Nielsen, MD 01/18/13 [phone]

## 2013-01-18 NOTE — H&P (Signed)
[name] History and Physical  Todd Page  ZOX:[id number]  DOB: [birth year]   DOA: 01/18/2013   PCP:   No primary provider on file.   Chief Complaint:  Shortness of breath  HPI: Todd Page is an 62 y.o. male.   Obese middle-aged Caucasian gentleman with its affective disorder, systolic heart failure, atrial fibrillation not maintained on anticoagulation because of noncompliance, who has multiple and recurrent admissions to the hospitalist service because of A. fib with rapid ventricular response due to noncompliance. Patient was discharged from this service for a similar episode earlier this month.  Patient reports that he has been compliant with his medications, and simultaneous reports that he is just filled his medications and is about to start them.  Reports that he was feeling fine until he came outside tonight and spelled some plastic and immediately became very short of breath; he was seen in the emergency room 2 days ago for acute shortness of breath associated with A. fib rapid ventricular response.  Emergency room tonight patient was again noted to be in A. fib with RVR with a heart rate of 125, he was placed on a Cardizem drip and at a rate of 10 mg per hour, he continues to have a heart rate in the high 90s, and continues to be short of breath. Hospitalist service was called to assist  He denies worsening cough he denies fever or chills  Rewiew of Systems:   All systems negative except as marked bold or noted in the HPI;  Constitutional:    malaise, fever and chills. ;  Eyes:   eye pain, redness and discharge. ;  ENMT:   ear pain, hoarseness, nasal congestion, sinus pressure and sore throat. ;  Cardiovascular:    diaphoresis,  and peripheral edema.  Respiratory:   cough, hemoptysis, wheezing and stridor. ;  Gastrointestinal:  nausea, vomiting, diarrhea, constipation, abdominal pain, melena, blood in stool, hematemesis, jaundice and rectal bleeding. unusual weight loss..    Genitourinary:    frequency, dysuria, incontinence,flank pain and hematuria; Musculoskeletal:   back pain and neck pain.  swelling and trauma.;  Skin: .  pruritus, rash, abrasions, bruising and skin lesion.; ulcerations Neuro:    headache, lightheadedness and neck stiffness.  weakness, altered level of consciousness, altered mental status, extremity weakness, burning feet, involuntary movement, seizure and syncope.  Psych:    anxiety, depression, insomnia, tearfulness, panic attacks, hallucinations, paranoia, suicidal or homicidal ideation    Past Medical History  Diagnosis Date  . Hypertension     minimal coronary atherosclerosis by CT  . Schizoaffective disorder     With depression  . Atrial fibrillation 06/2012    onset 06/2012; normal TSH; EF-45%  . Overweight   . Gout   . Noncompliance 09/21/2012  . Cholelithiasis     incidental finding on CT in 2013  . Systolic dysfunction     Past Surgical History  Procedure Laterality Date  . Cardiac surgery      ? history accurate; no apparent incisions  . Colonoscopy  2006    normal screening examination    Medications:  HOME MEDS: Prior to Admission medications   Medication Sig Start Date End Date Taking? Authorizing Provider  ALPRAZolam (XANAX) 0.25 MG tablet Take 0.25 mg by mouth 3 (three) times daily as needed for sleep.   Yes Historical Provider, MD  aspirin 81 MG tablet Take 81 mg by mouth daily.   Yes Historical Provider, MD  carvedilol (COREG) 6.25 MG tablet  Take 1 tablet (6.25 mg total) by mouth 2 (two) times daily with a meal. 12/29/12  Yes Hollice Espy, MD  diltiazem (CARDIZEM LA) 240 MG 24 hr tablet Take 1 tablet (240 mg total) by mouth at bedtime. For treatment of your elevated heart rate and high blood pressure. 11/22/12  Yes Gerhard Munch, MD  furosemide (LASIX) 20 MG tablet Take 1 tablet (20 mg total) by mouth daily. 12/29/12  Yes Hollice Espy, MD  isosorbide mononitrate (IMDUR) 30 MG 24 hr tablet Take 1  tablet (30 mg total) by mouth daily. 12/29/12  Yes Hollice Espy, MD  lisinopril (PRINIVIL,ZESTRIL) 10 MG tablet Take 1 tablet (10 mg total) by mouth daily. For treatment of your high blood pressure and your heart. 12/29/12  Yes Hollice Espy, MD  potassium chloride SA (K-DUR,KLOR-CON) 20 MEQ tablet Take 1 tablet (20 mEq total) by mouth daily. 12/29/12  Yes Hollice Espy, MD  predniSONE (DELTASONE) 10 MG tablet 50 mg (5 tabs) po daily x 1 day, then 40 mg on day 2, then 30mg  on day 3, then 20mg  on day 4, then 10 mg on day 5 and stop. 12/29/12  Yes Hollice Espy, MD     Allergies:  No Known Allergies  Social History:   reports that he has never smoked. He does not have any smokeless tobacco history on file. He reports that  drinks alcohol. He reports that he does not use illicit drugs.  Family History: Family History  Problem Relation Age of Onset  . Hypertension Mother   . Liver disease Father      Physical Exam: Filed Vitals:   01/18/13 0444 01/18/13 0521 01/18/13 0603  BP: 155/108 153/100 147/77  Pulse: 103 99   Temp: 97.8 F (36.6 C)    TempSrc: Oral    Resp: 24 19   Height: 6' (1.829 m)    Weight: 140.615 kg (310 lb)    SpO2: 98% 97%    Blood pressure 147/77, pulse 99, temperature 97.8 F (36.6 C), temperature source Oral, resp. rate 19, height 6' (1.829 m), weight 140.615 kg (310 lb), SpO2 97.00%.  GEN:  Pleasant obese middle-aged Caucasian gentleman sitting up in the stretcher in moderate respiratory distress; cooperative with exam PSYCH:  alert and oriented ;  anxious or depressed; affect is flat. HEENT: Mucous membranes pink and anicteric; PERRLA; EOM intact; no cervical lymphadenopathy nor thyromegaly or carotid bruit; thick neck Breasts:: Not examined CHEST WALL: No tenderness CHEST: Tachypneic, clear to auscultation bilaterally HEART: Irregularly irregular rhythm; no murmurs rubs or gallops BACK: Mild kyphosis no scoliosis; no CVA tenderness ABDOMEN:  Obese, soft non-tender; no masses, no organomegaly, normal abdominal bowel sounds Rectal Exam: Not done EXTREMITIES:; age-appropriate arthropathy of the hands and knees; trace edema lateral; erythema and abrasions of both legs. Some petechiae of both legs  Genitalia: not examined PULSES: 2+ and symmetric SKIN: Normal hydration no rash or ulceration other than noted above on his legs CNS: Cranial nerves 2-12 grossly intact no focal lateralizing neurologic deficit   Labs on Admission:  Basic Metabolic Panel:  Recent Labs Lab 01/16/13 0758 01/18/13 0427  NA 141 140  K 3.8 3.4*  CL 103 102  CO2 29 29  GLUCOSE 110* 111*  BUN 17 17  CREATININE 1.23 1.28  CALCIUM 9.1 9.0   Liver Function Tests:  Recent Labs Lab 01/16/13 0758  AST 22  ALT 40  ALKPHOS 75  BILITOT 0.5  PROT 6.9  ALBUMIN 3.6  No results found for this basename: LIPASE, AMYLASE,  in the last 168 hours No results found for this basename: AMMONIA,  in the last 168 hours CBC:  Recent Labs Lab 01/16/13 0758 01/18/13 0427  WBC 7.0 5.2  NEUTROABS 4.5  --   HGB 15.1 14.1  HCT 46.2 42.4  MCV 82.2 82.5  PLT 201 194   Cardiac Enzymes:  Recent Labs Lab 01/16/13 0758  TROPONINI <0.30   BNP: No components found with this basename: POCBNP,  D-dimer: No components found with this basename: D-DIMER,  CBG: No results found for this basename: GLUCAP,  in the last 168 hours  Radiological Exams on Admission: Dg Chest 2 View  01/16/2013  *RADIOLOGY REPORT*  Clinical Data: Shortness of breath.  High blood pressure.  CHEST - 2 VIEW  Comparison: 12/26/2012 and 12/24/2012.  Findings: Cardiomegaly.  Pulmonary vascular prominence most notable centrally.  Elevated right hemidiaphragm with right middle lobe subsegmental atelectasis.  No segmental consolidation seen separate from these findings.  No gross pneumothorax.  IMPRESSION: Cardiomegaly.  Pulmonary vascular prominence most notable centrally.  Elevated right  hemidiaphragm with right middle lobe subsegmental atelectasis.   Original Report Authenticated By: Lacy Duverney, M.D.    Dg Chest Portable 1 View  01/18/2013  *RADIOLOGY REPORT*  Clinical Data: Shortness of breath  PORTABLE CHEST - 1 VIEW  Comparison: 01/16/2013  Findings: Cardiomegaly.  Central vascular congestion.  Bibasilar opacities.  Small effusions not excluded.  No pneumothorax.  No acute osseous finding.  IMPRESSION: Similar to prior.  Cardiomegaly with central vascular congestion.  Bibasilar opacities; atelectasis versus infiltrate.   Original Report Authenticated By: Jearld Lesch, M.D.     EKG: Independently reviewed. A. fib with rapid ventricular response; nonspecific ST segment changes seem to be worse  Assessment/Plan Present on Admission:   . Acute on chronic systolic HF (heart failure) . Atrial fibrillation with rapid ventricular response . Schizoaffective disorder . Obesity . Malignant hypertension . Hypokalemia . Dyspnea   PLAN: We'll admit this gentleman to the step down unit on a Cardizem drip for control of his atrial fibrillation; will give him only a prophylactic anticoagulation.  We'll replete his potassium which may be contributing to his A. Fib. Will start his home dose of rate controllers, diltiazem and carvedilol, since it is highly likely has been noncompliant, - the view to titrating off his a Cardizem drip soon.  Will start Lasix at 40 mg IV every 2 hours, and monitor his breathing closely; will likely want to cut down the dose sooner rather than later to prevent dehydration renal failure.  Describes his symptoms as being precipitated by the staff of plastic, will monitor for the onset wheezing in case he is having an allergic phenomena, and may benefit from nebulization.  We'll cycle intact his cardiac enzymes, in case this new attack was triggered by an acute MI.  Will need coordination of his outpatient management to prevent recurrent admissions or  shortness of breath and uncontrolled heart.  No current indication to repeat his echo at this visit his last echo was September 2013 ejection fraction of 45-50% Other plans as per orders.  Code Status: FULL CODE  Family Communication: With the patient Disposition Plan: Pending on response to initial  Critical care time: 60 minutes.   Ayona Yniguez Nocturnist [name] Pager [phone]   01/18/2013, 6:22 AM

## 2013-01-18 NOTE — Progress Notes (Signed)
Lovenox prophylaxis dose changed from 40 mg to 70 mg SQ daily due to patient weight Patient weight 140.3 kg BMI > 30, presently 41.9 CrCL > 30 ml/min , presently 88 ml/min Lovenox dosed 0.5 mg/kg (70 mg) every 24 hours  Todd Page [facility]

## 2013-01-19 LAB — BASIC METABOLIC PANEL
BUN: 18 mg/dL (ref 6–23)
CO2: 30 mEq/L (ref 19–32)
Chloride: 103 mEq/L (ref 96–112)
GFR calc non Af Amer: 45 mL/min — ABNORMAL LOW (ref >90)
Glucose, Bld: 106 mg/dL — ABNORMAL HIGH (ref 70–99)
Potassium: 3.9 mEq/L (ref 3.5–5.1)
Sodium: 141 mEq/L (ref 135–145)

## 2013-01-19 LAB — BLOOD GAS, ARTERIAL
O2 Content: 2.5 L/min
O2 Saturation: 98 %
Patient temperature: 37
pO2, Arterial: 90.5 mmHg (ref 80.0–100.0)

## 2013-01-19 MED ORDER — CARVEDILOL 12.5 MG PO TABS
12.5000 mg | ORAL_TABLET | Freq: Two times a day (BID) | ORAL | Status: DC
  Administered 2013-01-19 – 2013-01-21 (×4): 12.5 mg via ORAL
  Filled 2013-01-19 (×4): qty 1

## 2013-01-19 MED ORDER — CARVEDILOL 3.125 MG PO TABS
6.2500 mg | ORAL_TABLET | Freq: Once | ORAL | Status: AC
  Administered 2013-01-19: 6.25 mg via ORAL

## 2013-01-19 MED ORDER — ALBUTEROL SULFATE (5 MG/ML) 0.5% IN NEBU
2.5000 mg | INHALATION_SOLUTION | RESPIRATORY_TRACT | Status: DC
  Administered 2013-01-19 – 2013-01-21 (×11): 2.5 mg via RESPIRATORY_TRACT
  Filled 2013-01-19 (×9): qty 0.5

## 2013-01-19 MED ORDER — IPRATROPIUM BROMIDE 0.02 % IN SOLN
0.5000 mg | RESPIRATORY_TRACT | Status: DC
  Administered 2013-01-19 – 2013-01-21 (×11): 0.5 mg via RESPIRATORY_TRACT
  Filled 2013-01-19 (×9): qty 2.5

## 2013-01-19 NOTE — Progress Notes (Signed)
UR Chart Review Completed  

## 2013-01-19 NOTE — Clinical Social Work Psychosocial (Signed)
Clinical Social Work Department BRIEF PSYCHOSOCIAL ASSESSMENT 01/19/2013  Patient:  Todd Page, Todd Page     Account Number:  [id number]     Admit date:  01/18/2013  Clinical Social Worker:  Nancie Neas  Date/Time:  01/19/2013 11:05 AM  Referred by:  Physician  Date Referred:  01/19/2013 Referred for  ALF Placement   Other Referral:   Interview type:  Patient Other interview type:    PSYCHOSOCIAL DATA Living Status:  ALONE Admitted from facility:   Level of care:   Primary support name:   Primary support relationship to patient:   Degree of support available:   very limited    CURRENT CONCERNS Current Concerns  Post-Acute Placement   Other Concerns:    SOCIAL WORK ASSESSMENT / PLAN CSW met with pt at bedside in ICU. Pt alert and oriented. He is well known to CSW from previous admissions. Pt is living alone currently while his mother is receiving rehab at [facility]. He reports he began having breathing difficulty at home and called EMS. CSW received referral due to poor hygiene and self-care, noncompliance, and possible need for ALF. Pt states he has no difficulty with bathing or self care. He indicates he does okay at home but has very limited family involvement. Pt reports his only issue is transportation, but he is aware of [facility] and has used it in the past. Pt states he has $3 for transport via [facility] home. Pt said he does not have a PCP, but will follow up with [place]. CM had already spoken with pt who refused home health. CSW discussed option of ALF and pt states he is not at that point yet of needing a facility.   Assessment/plan status:  Referral to [facility] Other assessment/ plan:   Information/referral to community resources:   ALF list  [facility]    PATIENT'S/FAMILY'S RESPONSE TO PLAN OF CARE: Pt reports no further needs for CSW as he is planning on returning home and feels he manages fine. CSW signing off but can be reconsulted if needed.        Derenda Fennel,  [place] [phone]

## 2013-01-19 NOTE — Progress Notes (Signed)
[name] PROGRESS NOTE  Todd Page WJX:[id number] DOB: [birth year] DOA: 01/18/2013 PCP: No primary provider on file.  Assessment: 1. Atrial fibrillation with rapid ventricular response: Continue rate control. Increase beta blocker. Secondary to noncompliance. No signs of ACS. Cardiac enzymes negative. Not on anticoagulation secondary to noncompliance. EKG today atrial fibrillation, rate controlled. No acute changes seen. 2. Acute/chronic systolic heart failure: Pulmonary status stable, fluid balance negative. Continue IV Lasix, ACE inhibitor, continue oral nitrates. Secondary to noncompliance. Echocardiogram 06/2012: Left ventricular ejection fraction 45-50%, severe hypokinesis of the basal mid inferolateral myocardium. Not severely decompensated, continue beta blocker.  3. Hypokalemia: Repleted.  4. Folliculitis: No evidence of superinfection or cellulitis. No exposure to pools or hot tubs. 5. Schizoaffective disorder: Appears stable. Continue Xanax 6. Noncompliance: Multiple admissions for atrial fibrillation with rapid ventricular response secondary to noncompliance. No PCP. Consult case management. Again encouraged to followup with cardiology as an outpatient. 7. Hypercapnia with metabolic compensation: Alert, oriented able to transfer with assistance. no evidence to suggest acute respiratory failure. Monitor clinically. Suspect obstructive sleep apnea.  Plan: 1. Continue IV Lasix, strict I/O. 2. Increase carvedilol. 3. Transfer to telemetry.  4. Outpatient cardiology followup has been scheduled.  5. Discussed with case management--patient will be made to establish patient primary care physician. Would benefit from home health disease management/CHF the patient agreeable.  6. Anticipate discharge one to 2 days, discussed with patient he is agreeable.  Code Status: Full code  Family Communication: None present  Disposition Plan: Home when improved   Brendia Sacks, MD  [facility]  Pager [phone] If 7PM-7AM, please contact night-coverage at [email or web address], password [facility] 01/19/2013, 8:21 AM  LOS: 1 day   Brief narrative: 62 year old man PMH afib, non-compliance, systolic CHF with recurrent admissions for afib RVR & non-compliance presented with shortness of breath. In the emergency department was noted to have atrial fibrillation with rapid ventricular response and had shortness of breath.  Consultants:    Procedures:     HPI/Subjective: Afebrile, hypertensive. Heart rate controlled. Excellent urine output, -1.6 L. No problems over night. Slept okay. Positive bowel movement. Discussed with RN- no concerns.  Objective: Filed Vitals:   01/19/13 0558 01/19/13 0600 01/19/13 0700 01/19/13 0800  BP:  146/92 159/123 210/170  Pulse:  107 53   Temp:    97.3 F (36.3 C)  TempSrc:    Oral  Resp:    21  Height:      Weight:      SpO2: 98% 99% 95%     Intake/Output Summary (Last 24 hours) at 01/19/13 0821 Last data filed at 01/19/13 0230  Gross per 24 hour  Intake   1090 ml  Output   2600 ml  Net  -1510 ml   Filed Weights   01/18/13 0444 01/18/13 0700 01/19/13 0500  Weight: 140.615 kg (310 lb) 140.3 kg (309 lb 4.9 oz) 139.5 kg (307 lb 8.7 oz)    Exam:  General:  Examined in the ICU. Appears calm and comfortable. Cardiovascular: Irregular, no m/r/g. 2+ bilateral LE edema without change. Telemetry: Atrial fibrillation, rate 90/100. Respiratory: CTA bilaterally, no w/r/r. Normal respiratory effort. Abdomen: soft, ntnd Skin: Folliculitis noted over anterior chest predominantly. No evidence of cellulitis. Psychiatric: grossly normal mood and affect, speech fluent and appropriate Neurologic: grossly non-focal.  Data Reviewed: Basic Metabolic Panel:  Recent Labs Lab 01/16/13 0758 01/18/13 0427 01/19/13 0422  NA 141 140 141  K 3.8 3.4* 3.9  CL 103 102 103  CO2 29  29 30  GLUCOSE 110* 111* 106*  BUN 17 17 18   CREATININE 1.23 1.28 1.60*   CALCIUM 9.1 9.0 8.9  MG  --   --  2.2   Liver Function Tests:  Recent Labs Lab 01/16/13 0758  AST 22  ALT 40  ALKPHOS 75  BILITOT 0.5  PROT 6.9  ALBUMIN 3.6   CBC:  Recent Labs Lab 01/16/13 0758 01/18/13 0427  WBC 7.0 5.2  NEUTROABS 4.5  --   HGB 15.1 14.1  HCT 46.2 42.4  MCV 82.2 82.5  PLT 201 194   Cardiac Enzymes:  Recent Labs Lab 01/16/13 0758 01/18/13 1024 01/18/13 1514  TROPONINI <0.30 <0.30 <0.30   BNP (last 3 results)  Recent Labs  12/26/12 1016 01/16/13 0758 01/19/13 0500  PROBNP 223.3* 815.6* 242.1*    Studies: Dg Chest Portable 1 View  01/18/2013  *RADIOLOGY REPORT*  Clinical Data: Shortness of breath  PORTABLE CHEST - 1 VIEW  Comparison: 01/16/2013  Findings: Cardiomegaly.  Central vascular congestion.  Bibasilar opacities.  Small effusions not excluded.  No pneumothorax.  No acute osseous finding.  IMPRESSION: Similar to prior.  Cardiomegaly with central vascular congestion.  Bibasilar opacities; atelectasis versus infiltrate.   Original Report Authenticated By: Jearld Lesch, M.D.     Scheduled Meds: . albuterol  2.5 mg Nebulization Q4H WA  . carvedilol  6.25 mg Oral BID WC  . diltiazem  240 mg Oral QHS  . enoxaparin (LOVENOX) injection  70 mg Subcutaneous Q24H  . furosemide  40 mg Intravenous Q12H  . ipratropium  0.5 mg Nebulization Q4H WA  . isosorbide mononitrate  30 mg Oral Daily  . lisinopril  10 mg Oral Daily  . mupirocin cream   Topical BID  . potassium chloride  40 mEq Oral BID   Continuous Infusions:    Principal Problem:   Atrial fibrillation with rapid ventricular response Active Problems:   Obesity   Schizoaffective disorder   Noncompliance   Malignant hypertension   Dyspnea   Hypokalemia   Acute on chronic systolic HF (heart failure)     Brendia Sacks, MD  [name] Pager [phone] If 7PM-7AM, please contact night-coverage at [email or web address], password [facility] 01/19/2013, 8:21 AM  LOS: 1 day    Time spent: 20 minutes

## 2013-01-19 NOTE — Progress Notes (Signed)
Report given Zannie Kehr, RN and pt transport via wheelchair to [phone] with personal belongings.

## 2013-01-19 NOTE — Care Management Note (Addendum)
    Page 1 of 2   01/21/2013     11:46:47 AM   CARE MANAGEMENT NOTE 01/21/2013  Patient:  Todd Page, Todd Page   Account Number:  [id number]  Date Initiated:  01/19/2013  Documentation initiated by:  Rosemary Holms  Subjective/Objective Assessment:   Pt admitted from home where he lives in his mother's home. Mother currently in [facility]. CM spoke with Pt at bedside. Pt states he plans to go home at DC but denies [facility] needs.     Action/Plan:   Pt to follow up with [place] after DC althought NP from [place] states he has been noncompliant with appts.   Anticipated DC Date:  01/20/2013   Anticipated DC Plan:  HOME/SELF CARE  In-house referral  Clinical Social Worker      DC Planning Services  CM consult      Choice offered to / List presented to:          [facility] arranged  HH-1 RN  HH-10 DISEASE MANAGEMENT      HH agency  [facility]   Status of service:  Completed, signed off Medicare Important Message given?  YES (If response is "NO", the following Medicare IM given date fields will be blank) Date Medicare IM given:  01/21/2013 Date Additional Medicare IM given:    Discharge Disposition:  HOME/SELF CARE  Per UR Regulation:    If discussed at Long Length of Stay Meetings, dates discussed:    Comments:  01/20/13 Rosemary Holms RN BSN CM Pt agreed to [facility] RN, selected [facility]. Order for [facility] RN directed to [facility]  01/19/13 Corinda Ammon Leanord Hawking RN BSN CM

## 2013-01-20 LAB — BASIC METABOLIC PANEL WITH GFR
BUN: 22 mg/dL (ref 6–23)
CO2: 30 meq/L (ref 19–32)
Calcium: 9.2 mg/dL (ref 8.4–10.5)
Chloride: 101 meq/L (ref 96–112)
Creatinine, Ser: 1.57 mg/dL — ABNORMAL HIGH (ref 0.50–1.35)
GFR calc Af Amer: 53 mL/min — ABNORMAL LOW
GFR calc non Af Amer: 46 mL/min — ABNORMAL LOW
Glucose, Bld: 108 mg/dL — ABNORMAL HIGH (ref 70–99)
Potassium: 3.6 meq/L (ref 3.5–5.1)
Sodium: 139 meq/L (ref 135–145)

## 2013-01-20 NOTE — Progress Notes (Signed)
[name] PROGRESS NOTE  LAKOTA MARKGRAF ZOX:[id number] DOB: 12-Sep-1951 DOA: 01/18/2013 PCP: No primary provider on file.  Assessment: 1. Atrial fibrillation with rapid ventricular response: Rate control improved. Continue beta blocker at increased dose. Secondary to noncompliance. No signs of ACS. Cardiac enzymes negative. Not on anticoagulation secondary to noncompliance.  2. Acute/chronic systolic heart failure: Pulmonary status stable, weight decreased. Continue IV Lasix, ACE inhibitor, continue oral nitrates. Secondary to noncompliance. Echocardiogram 06/2012: Left ventricular ejection fraction 45-50%, severe hypokinesis of the basal mid inferolateral myocardium. Not severely decompensated, continue beta blocker.  3. Hypokalemia: Repleted.  4. Folliculitis: No evidence of superinfection or cellulitis. No exposure to pools or hot tubs. Improving. 5. Schizoaffective disorder: Appears stable. Continue Xanax 6. Noncompliance: Multiple admissions for atrial fibrillation with rapid ventricular response secondary to noncompliance. No PCP.  Ideally would discharge with disease management home health.  Consult case management--patient declines home health. [place] [facility] has kindly given him an outpatient appointment.  7. Hypercapnia with metabolic compensation: Alert, oriented able to transfer with assistance. no evidence to suggest acute respiratory failure. Monitor clinically. Suspect obstructive sleep apnea.  Plan: 1. Continue IV Lasix. Possibly change to oral later today.  2. Outpatient cardiology followup has been scheduled.  3. Refuses home health.  4. Anticipate discharge later today or 3/26.   Code Status: Full code  Family Communication: None present  Disposition Plan: Home. Patient refused home health. Evaluated by social work patient refuses assisted living facility.  Brendia Sacks, MD  [name]  Pager [phone] If 7PM-7AM, please contact night-coverage at  [email or web address], password [facility] 01/20/2013, 8:09 AM  LOS: 2 days   Brief narrative: 61 year old man PMH afib, non-compliance, systolic CHF with recurrent admissions for afib RVR & non-compliance presented with shortness of breath. In the emergency department was noted to have atrial fibrillation with rapid ventricular response and had shortness of breath.  Consultants:    Procedures:     HPI/Subjective: Afebrile, VSS. BP well-controlled. No hypoxia. Overall feels better. Still short of breath at times. Weight down 3 kg since admission.  Objective: Filed Vitals:   01/19/13 1508 01/19/13 2033 01/20/13 0515 01/20/13 0653  BP: 123/81 113/74 116/77   Pulse:  79 70   Temp: 98 F (36.7 C) 97.9 F (36.6 C) 98.8 F (37.1 C)   TempSrc:  Oral    Resp: 22 20 20    Height:      Weight:   137 kg (302 lb 0.5 oz)   SpO2:  92% 94% 94%    Intake/Output Summary (Last 24 hours) at 01/20/13 0809 Last data filed at 01/19/13 1919  Gross per 24 hour  Intake    940 ml  Output      0 ml  Net    940 ml   Filed Weights   01/18/13 0700 01/19/13 0500 01/20/13 0515  Weight: 140.3 kg (309 lb 4.9 oz) 139.5 kg (307 lb 8.7 oz) 137 kg (302 lb 0.5 oz)    Exam:  General:  Appears calm and comfortable. Cardiovascular: Irregular, no m/r/g. 2+ bilateral LE edema somewhat decreased. Telemetry: Atrial fibrillation, rate controlled. Respiratory: CTA bilaterally, no w/r/r. Normal respiratory effort. Abdomen: soft, ntnd Skin: Folliculitis noted over anterior chest predominantly. No evidence of cellulitis. Overall appears improved. Psychiatric: grossly normal mood and affect, speech fluent and appropriate  Data Reviewed: Basic Metabolic Panel:  Recent Labs Lab 01/16/13 0758 01/18/13 0427 01/19/13 0422 01/20/13 0458  NA 141 140 141 139  K 3.8 3.4* 3.9 3.6  CL  103 102 103 101  CO2 29 29 30 30   GLUCOSE 110* 111* 106* 108*  BUN 17 17 18 22   CREATININE 1.23 1.28 1.60* 1.57*  CALCIUM 9.1 9.0 8.9 9.2   MG  --   --  2.2  --    Liver Function Tests:  Recent Labs Lab 01/16/13 0758  AST 22  ALT 40  ALKPHOS 75  BILITOT 0.5  PROT 6.9  ALBUMIN 3.6   CBC:  Recent Labs Lab 01/16/13 0758 01/18/13 0427  WBC 7.0 5.2  NEUTROABS 4.5  --   HGB 15.1 14.1  HCT 46.2 42.4  MCV 82.2 82.5  PLT 201 194   Cardiac Enzymes:  Recent Labs Lab 01/16/13 0758 01/18/13 1024 01/18/13 1514  TROPONINI <0.30 <0.30 <0.30   BNP (last 3 results)  Recent Labs  12/26/12 1016 01/16/13 0758 01/19/13 0500  PROBNP 223.3* 815.6* 242.1*    Studies: No results found.  Scheduled Meds: . albuterol  2.5 mg Nebulization Q4H WA  . carvedilol  12.5 mg Oral BID WC  . diltiazem  240 mg Oral QHS  . enoxaparin (LOVENOX) injection  70 mg Subcutaneous Q24H  . furosemide  40 mg Intravenous Q12H  . ipratropium  0.5 mg Nebulization Q4H WA  . isosorbide mononitrate  30 mg Oral Daily  . lisinopril  10 mg Oral Daily  . mupirocin cream   Topical BID  . potassium chloride  40 mEq Oral BID   Continuous Infusions:    Principal Problem:   Atrial fibrillation with rapid ventricular response Active Problems:   Obesity   Schizoaffective disorder   Noncompliance   Malignant hypertension   Dyspnea   Hypokalemia   Acute on chronic systolic HF (heart failure)     Brendia Sacks, MD  [name] Pager [phone] If 7PM-7AM, please contact night-coverage at [email or web address], password [facility] 01/20/2013, 8:09 AM  LOS: 2 days   Time spent: 20 minutes

## 2013-01-21 DIAGNOSIS — Z9119 Patient's noncompliance with other medical treatment and regimen: Secondary | ICD-10-CM

## 2013-01-21 DIAGNOSIS — I4891 Unspecified atrial fibrillation: Principal | ICD-10-CM

## 2013-01-21 DIAGNOSIS — I5023 Acute on chronic systolic (congestive) heart failure: Secondary | ICD-10-CM

## 2013-01-21 DIAGNOSIS — I10 Essential (primary) hypertension: Secondary | ICD-10-CM

## 2013-01-21 LAB — BASIC METABOLIC PANEL
BUN: 22 mg/dL (ref 6–23)
Calcium: 9.1 mg/dL (ref 8.4–10.5)
Creatinine, Ser: 1.4 mg/dL — ABNORMAL HIGH (ref 0.50–1.35)
GFR calc Af Amer: 61 mL/min — ABNORMAL LOW (ref >90)
GFR calc non Af Amer: 53 mL/min — ABNORMAL LOW (ref >90)
Glucose, Bld: 107 mg/dL — ABNORMAL HIGH (ref 70–99)

## 2013-01-21 MED ORDER — ASPIRIN 81 MG PO TABS: 81.0000 mg | ORAL_TABLET | Freq: Every day | ORAL | Status: DC

## 2013-01-21 MED ORDER — FUROSEMIDE 20 MG PO TABS: 40.0000 mg | ORAL_TABLET | Freq: Every day | ORAL | Status: DC

## 2013-01-21 MED ORDER — POTASSIUM CHLORIDE CRYS ER 20 MEQ PO TBCR: 40.0000 meq | EXTENDED_RELEASE_TABLET | Freq: Every day | ORAL | Status: DC

## 2013-01-21 MED ORDER — LISINOPRIL 10 MG PO TABS: 10.0000 mg | ORAL_TABLET | Freq: Every day | ORAL | Status: DC

## 2013-01-21 MED ORDER — ISOSORBIDE MONONITRATE ER 30 MG PO TB24: 30.0000 mg | ORAL_TABLET | Freq: Every day | ORAL | Status: DC

## 2013-01-21 MED ORDER — CARVEDILOL 12.5 MG PO TABS: 12.5000 mg | ORAL_TABLET | Freq: Two times a day (BID) | ORAL | Status: DC

## 2013-01-21 MED ORDER — DILTIAZEM HCL ER COATED BEADS 240 MG PO TB24: 240.0000 mg | ORAL_TABLET | Freq: Every day | ORAL | Status: DC

## 2013-01-21 NOTE — Progress Notes (Signed)
Patient received discharge instructions along with follow up appointments and prescriptions. Patient verbalized understanding of all instructions. Patient was escorted by staff via wheelchair to vehicle. Patient discharged to home in stable condition. 

## 2013-01-21 NOTE — Discharge Summary (Signed)
Physician Discharge Summary  GANON DEMASI VWU:[id number] DOB: 09-02-1951 DOA: 01/18/2013  PCP: No primary provider on file.  Admit date: 01/18/2013 Discharge date: 01/21/2013  Time spent: 40 minutes  Recommendations for Outpatient Follow-up:  1. Outpatient follow up with cardiology 2. Home health RN for disease management 3. Primary care doctor in 1 week  Discharge Diagnoses:  Principal Problem:   Atrial fibrillation with rapid ventricular response Active Problems:   Obesity   Schizoaffective disorder   Noncompliance   Malignant hypertension   Dyspnea   Hypokalemia   Acute on chronic systolic HF (heart failure)   Discharge Condition: improved  Diet recommendation: low salt  Filed Weights   01/19/13 0500 01/20/13 0515 01/21/13 0457  Weight: 139.5 kg (307 lb 8.7 oz) 137 kg (302 lb 0.5 oz) 135.4 kg (298 lb 8.1 oz)    History of present illness:  Todd Page is an 62 y.o. male. Obese middle-aged Caucasian gentleman with its affective disorder, systolic heart failure, atrial fibrillation not maintained on anticoagulation because of noncompliance, who has multiple and recurrent admissions to the hospitalist service because of A. fib with rapid ventricular response due to noncompliance. Patient was discharged from this service for a similar episode earlier this month.  Patient reports that he has been compliant with his medications, and simultaneous reports that he is just filled his medications and is about to start them.  Reports that he was feeling fine until he came outside tonight and spelled some plastic and immediately became very short of breath; he was seen in the emergency room 2 days ago for acute shortness of breath associated with A. fib rapid ventricular response.  Emergency room tonight patient was again noted to be in A. fib with RVR with a heart rate of 125, he was placed on a Cardizem drip and at a rate of 10 mg per hour, he continues to have a heart rate in the high 90s,  and continues to be short of breath. Hospitalist service was called to assist  He denies worsening cough he denies fever or chills   Hospital Course:  This patient was admitted to the hospital for shortness of breath as well as atrial fibrillation with rapid ventricular response. He was started on a Cardizem infusion and was diuresed with IV Lasix. His diltiazem dose was continued and Coreg was increased to 12.5 mg twice a day. Heart rate is under better control at this point. He recently had echocardiogram done which indicated a ejection fraction of 45-50%. This was not repeated. He has diurese well with IV Lasix and his shortness of breath is also improved. He is breathing comfortably on room air. He was set up to see cardiology as an outpatient. The main issue for this patient has been noncompliance with his medications. He has declined any placement to assisted-living facility. He is agreeable this time for home health RN. He has been set up for appropriate outpatient followup, but I suspect that noncompliance will be an ongoing issue with him. He is not a candidate for anticoagulation for atrial fib due to noncompliance as well. Patient is felt stable to discharge home today.  Procedures:  none  Consultations:  none  Discharge Exam: Filed Vitals:   01/20/13 2039 01/21/13 0349 01/21/13 0457 01/21/13 0642  BP: 152/90  107/68   Pulse: 94  70   Temp: 97.8 F (36.6 C)  97.2 F (36.2 C)   TempSrc: Oral  Oral   Resp: 20  20  Height:      Weight:   135.4 kg (298 lb 8.1 oz)   SpO2: 91% 90% 98% 100%    General: NAD Cardiovascular: S1, S2 irregular, 1+ edema b/l Respiratory: CTA B  Discharge Instructions  Discharge Orders   Future Appointments Provider Department Dept Phone   01/29/2013 1:20 PM Jodelle Gross, NP  [facility] [phone]   Future Orders Complete By Expires     (HEART FAILURE PATIENTS) Call MD:  Anytime you have any of the following symptoms:  1) 3 pound weight gain in 24 hours or 5 pounds in 1 week 2) shortness of breath, with or without a dry hacking cough 3) swelling in the hands, feet or stomach 4) if you have to sleep on extra pillows at night in order to breathe.  As directed     Diet - low sodium heart healthy  As directed     Face-to-face encounter (required for Medicare/Medicaid patients)  As directed     Comments:      I Clemencia Helzer certify that this patient is under my care and that I, or a nurse practitioner or physician's assistant working with me, had a face-to-face encounter that meets the physician face-to-face encounter requirements with this patient on 01/21/2013. The encounter with the patient was in whole, or in part for the following medical condition(s) which is the primary reason for home health care (List medical condition): Patient admitted with a fib with RVR, chf exacerbation.  Has history of non compliance and would benefit from home health RN for disease management.    Questions:      The encounter with the patient was in whole, or in part, for the following medical condition, which is the primary reason for home health care:  chf exacerbation, atrial fib with RVR    I certify that, based on my findings, the following services are medically necessary home health services:  Nursing    My clinical findings support the need for the above services:  Shortness of breath with activity    Further, I certify that my clinical findings support that this patient is homebound due to:  Shortness of Breath with activity    Reason for Medically Necessary Home Health Services:  Skilled Nursing- Teaching of Disease Process/Symptom Management    Home Health  As directed     Questions:      To provide the following care/treatments:  RN    Increase activity slowly  As directed         Medication List    STOP taking these medications       predniSONE 10 MG tablet  Commonly known as:  DELTASONE      TAKE these  medications       ALPRAZolam 0.25 MG tablet  Commonly known as:  XANAX  Take 0.25 mg by mouth 3 (three) times daily as needed for sleep.     aspirin 81 MG tablet  Take 1 tablet (81 mg total) by mouth daily.     carvedilol 12.5 MG tablet  Commonly known as:  COREG  Take 1 tablet (12.5 mg total) by mouth 2 (two) times daily with a meal.     diltiazem 240 MG 24 hr tablet  Commonly known as:  CARDIZEM LA  Take 1 tablet (240 mg total) by mouth at bedtime. For treatment of your elevated heart rate and high blood pressure.     furosemide 20 MG tablet  Commonly known as:  LASIX  Take 2 tablets (40 mg total) by mouth daily.     isosorbide mononitrate 30 MG 24 hr tablet  Commonly known as:  IMDUR  Take 1 tablet (30 mg total) by mouth daily.     lisinopril 10 MG tablet  Commonly known as:  PRINIVIL,ZESTRIL  Take 1 tablet (10 mg total) by mouth daily. For treatment of your high blood pressure and your heart.     potassium chloride SA 20 MEQ tablet  Commonly known as:  K-DUR,KLOR-CON  Take 2 tablets (40 mEq total) by mouth daily.           Follow-up Information   Follow up with Joni Reining, NP On 01/29/2013. (1:20)    Contact information:   [address] [place] [address] [phone]        The results of significant diagnostics from this hospitalization (including imaging, microbiology, ancillary and laboratory) are listed below for reference.    Significant Diagnostic Studies: Dg Chest 2 View  01/16/2013  *RADIOLOGY REPORT*  Clinical Data: Shortness of breath.  High blood pressure.  CHEST - 2 VIEW  Comparison: 12/26/2012 and 12/24/2012.  Findings: Cardiomegaly.  Pulmonary vascular prominence most notable centrally.  Elevated right hemidiaphragm with right middle lobe subsegmental atelectasis.  No segmental consolidation seen separate from these findings.  No gross pneumothorax.  IMPRESSION: Cardiomegaly.  Pulmonary vascular prominence most notable centrally.   Elevated right hemidiaphragm with right middle lobe subsegmental atelectasis.   Original Report Authenticated By: Lacy Duverney, M.D.    Dg Chest 2 View  12/24/2012  *RADIOLOGY REPORT*  Clinical Data: Shortness of breath and dizziness.  CHEST - 2 VIEW  Comparison: CT abdomen and pelvis 11/10/2012.  Single view of the chest and CT chest 09/20/2012.  Findings: There is cardiomegaly without pulmonary edema. Subsegmental atelectasis is seen in the lung bases.  No pneumothorax or pleural fluid.  IMPRESSION: Cardiomegaly without acute disease.   Original Report Authenticated By: Holley Dexter, M.D.    Dg Chest Portable 1 View  01/18/2013  *RADIOLOGY REPORT*  Clinical Data: Shortness of breath  PORTABLE CHEST - 1 VIEW  Comparison: 01/16/2013  Findings: Cardiomegaly.  Central vascular congestion.  Bibasilar opacities.  Small effusions not excluded.  No pneumothorax.  No acute osseous finding.  IMPRESSION: Similar to prior.  Cardiomegaly with central vascular congestion.  Bibasilar opacities; atelectasis versus infiltrate.   Original Report Authenticated By: Jearld Lesch, M.D.    [facility] 1 View  12/26/2012  *RADIOLOGY REPORT*  Clinical Data: Dyspnea.  PORTABLE CHEST - 1 VIEW  Comparison: 02/26 and 11/10/2012  Findings: The heart size and pulmonary vascularity are normal.  No infiltrates or effusions.  Chronic slight elevation of the right hemidiaphragm.  Slight peribronchial thickening on the left.  IMPRESSION: Slight peribronchial thickening suggesting bronchitis.   Original Report Authenticated By: [name], M.D.     Microbiology: Recent Results (from the past 240 hour(s))  MRSA PCR SCREENING     Status: None   Collection Time    01/18/13  6:50 AM      Result Value Range Status   MRSA by PCR NEGATIVE  NEGATIVE Final   Comment:            The GeneXpert MRSA Assay (FDA     approved for NASAL specimens     only), is one component of a     comprehensive MRSA colonization      surveillance program. It is not     intended to  diagnose MRSA     infection nor to guide or     monitor treatment for     MRSA infections.     Labs: Basic Metabolic Panel:  Recent Labs Lab 01/16/13 0758 01/18/13 0427 01/19/13 0422 01/20/13 0458 01/21/13 0457  NA 141 140 141 139 140  K 3.8 3.4* 3.9 3.6 3.8  CL 103 102 103 101 102  CO2 29 29 30 30 28   GLUCOSE 110* 111* 106* 108* 107*  BUN 17 17 18 22 22   CREATININE 1.23 1.28 1.60* 1.57* 1.40*  CALCIUM 9.1 9.0 8.9 9.2 9.1  MG  --   --  2.2  --   --    Liver Function Tests:  Recent Labs Lab 01/16/13 0758  AST 22  ALT 40  ALKPHOS 75  BILITOT 0.5  PROT 6.9  ALBUMIN 3.6   No results found for this basename: LIPASE, AMYLASE,  in the last 168 hours No results found for this basename: AMMONIA,  in the last 168 hours CBC:  Recent Labs Lab 01/16/13 0758 01/18/13 0427  WBC 7.0 5.2  NEUTROABS 4.5  --   HGB 15.1 14.1  HCT 46.2 42.4  MCV 82.2 82.5  PLT 201 194   Cardiac Enzymes:  Recent Labs Lab 01/16/13 0758 01/18/13 1024 01/18/13 1514  TROPONINI <0.30 <0.30 <0.30   BNP: BNP (last 3 results)  Recent Labs  12/26/12 1016 01/16/13 0758 01/19/13 0500  PROBNP 223.3* 815.6* 242.1*   CBG: No results found for this basename: GLUCAP,  in the last 168 hours     Signed:  Daunte Oestreich  [name] 01/21/2013, 9:57 AM

## 2013-01-23 NOTE — Progress Notes (Signed)
NO SHOW  Patient ID: Todd Page, male    DOB: 07-29-1951, 62 y.o.   MRN: [id number]  HPI    Review of Systems    Physical Exam

## 2013-01-29 ENCOUNTER — Encounter: Payer: Medicare Other | Admitting: Adult Health

## 2013-01-29 ENCOUNTER — Encounter: Payer: Self-pay | Admitting: Adult Health

## 2013-01-29 NOTE — Progress Notes (Deleted)
    HPI: Mr. Geister is a 62 year old patient of Dr. Dietrich Pates we are following her ongoing assessment post hospitalization for chronic atrial fibrillation where he was admitted with RVR, systolic dysfunction with EF of 45%, with history of medical noncompliance, hypertension, morbid obesity, and schizoaffective disorder.He  was treated with diltiazem drip and started on by mouth diltiazem on discharge. Found to be a candidate for Coumadin therapy as it is indeterminate if he has had a GI bleed or will be compliant with medications. He was also started on lisinopril for blood pressure control. With history of medical noncompliance termination of need for institution of anticoagulation will be discuss that this visit.    No Known Allergies  Current Outpatient Prescriptions  Medication Sig Dispense Refill  . ALPRAZolam (XANAX) 0.25 MG tablet Take 0.25 mg by mouth 3 (three) times daily as needed for sleep.      Marland Kitchen aspirin 81 MG tablet Take 1 tablet (81 mg total) by mouth daily.  30 tablet  1  . carvedilol (COREG) 12.5 MG tablet Take 1 tablet (12.5 mg total) by mouth 2 (two) times daily with a meal.  60 tablet  0  . diltiazem (CARDIZEM LA) 240 MG 24 hr tablet Take 1 tablet (240 mg total) by mouth at bedtime. For treatment of your elevated heart rate and high blood pressure.  30 tablet  0  . furosemide (LASIX) 20 MG tablet Take 2 tablets (40 mg total) by mouth daily.  30 tablet  0  . isosorbide mononitrate (IMDUR) 30 MG 24 hr tablet Take 1 tablet (30 mg total) by mouth daily.  30 tablet  0  . lisinopril (PRINIVIL,ZESTRIL) 10 MG tablet Take 1 tablet (10 mg total) by mouth daily. For treatment of your high blood pressure and your heart.  30 tablet  0  . potassium chloride SA (K-DUR,KLOR-CON) 20 MEQ tablet Take 2 tablets (40 mEq total) by mouth daily.  30 tablet  0   No current facility-administered medications for this visit.    Past Medical History  Diagnosis Date  . Hypertension     minimal coronary  atherosclerosis by CT  . Schizoaffective disorder     With depression  . Atrial fibrillation 06/2012    onset 06/2012; normal TSH; EF-45%  . Overweight   . Gout   . Noncompliance 09/21/2012  . Cholelithiasis     incidental finding on CT in 2013  . Systolic dysfunction     Past Surgical History  Procedure Laterality Date  . Cardiac surgery      ? history accurate; no apparent incisions  . Colonoscopy  2006    normal screening examination    ROS: PHYSICAL EXAM There were no vitals taken for this visit.  EKG:  ASSESSMENT AND PLAN

## 2013-02-11 ENCOUNTER — Encounter (INDEPENDENT_AMBULATORY_CARE_PROVIDER_SITE_OTHER): Payer: Medicare Other | Admitting: Adult Health

## 2013-02-11 DIAGNOSIS — I4891 Unspecified atrial fibrillation: Secondary | ICD-10-CM

## 2013-02-12 ENCOUNTER — Telehealth: Payer: Self-pay | Admitting: Adult Health

## 2013-02-12 NOTE — Telephone Encounter (Signed)
[facility] SEND REQUEST FOR MED REFILLS HE IS NOT SURE WHAT THE MEDS ARE BUT THEY HAVE NO REFILLS ON THEM.   PT IS DUE FOR F/U IT HAS BEEN SCHEDULED FOR 02/17/13 W/ KATHRYN

## 2013-02-13 NOTE — Telephone Encounter (Signed)
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

## 2013-02-16 ENCOUNTER — Telehealth: Payer: Self-pay | Admitting: Adult Health

## 2013-02-16 MED ORDER — FUROSEMIDE 20 MG PO TABS: 40.0000 mg | ORAL_TABLET | Freq: Every day | ORAL | Status: DC

## 2013-02-16 MED ORDER — DILTIAZEM HCL ER COATED BEADS 240 MG PO TB24: 240.0000 mg | ORAL_TABLET | Freq: Every day | ORAL | Status: DC

## 2013-02-16 MED ORDER — POTASSIUM CHLORIDE CRYS ER 20 MEQ PO TBCR: 40.0000 meq | EXTENDED_RELEASE_TABLET | Freq: Every day | ORAL | Status: DC

## 2013-02-16 MED ORDER — ISOSORBIDE MONONITRATE ER 30 MG PO TB24: 30.0000 mg | ORAL_TABLET | Freq: Every day | ORAL | Status: DC

## 2013-02-16 MED ORDER — CARVEDILOL 12.5 MG PO TABS: 12.5000 mg | ORAL_TABLET | Freq: Two times a day (BID) | ORAL | Status: DC

## 2013-02-16 MED ORDER — LISINOPRIL 10 MG PO TABS: 10.0000 mg | ORAL_TABLET | Freq: Every day | ORAL | Status: DC

## 2013-02-16 NOTE — Telephone Encounter (Signed)
All cardiac medications sent in for pt via escribe per noted pt has rescheduled f/u with [facility] tomorrow, per this nurse left message with pt family member to have pt come in for office visit per noted 3+ no shows for the last several OV and pt will need to be seen in order to continue to receive refills, sent enough for one month to last pt until next OV for all cardiac medications  

## 2013-02-16 NOTE — Telephone Encounter (Signed)
PT NEEDS CARVEDILOL AND POTASSIUM CALLED IN TO [facility]. HE HAS BEEN OUT FOR A COUPLE DAYS

## 2013-02-16 NOTE — Telephone Encounter (Signed)
All cardiac medications sent in for pt via escribe per noted pt has rescheduled f/u with [facility] tomorrow, per this nurse left message with pt family member to have pt come in for office visit per noted 3+ no shows for the last several OV and pt will need to be seen in order to continue to receive refills, sent enough for one month to last pt until next OV for all cardiac medications

## 2013-02-17 ENCOUNTER — Inpatient Hospital Stay (HOSPITAL_COMMUNITY)
Admission: EM | Admit: 2013-02-17 | Discharge: 2013-02-20 | DRG: 291 | Disposition: A | Discharge status: Home Health | Payer: Medicare Other | Attending: Internal Medicine | Admitting: Internal Medicine

## 2013-02-17 ENCOUNTER — Encounter: Payer: Medicare Other | Admitting: Adult Health

## 2013-02-17 ENCOUNTER — Encounter (HOSPITAL_COMMUNITY): Payer: Self-pay | Admitting: Emergency Medicine

## 2013-02-17 ENCOUNTER — Emergency Department (HOSPITAL_COMMUNITY): Payer: Medicare Other

## 2013-02-17 DIAGNOSIS — Z7401 Bed confinement status: Secondary | ICD-10-CM

## 2013-02-17 DIAGNOSIS — R600 Localized edema: Secondary | ICD-10-CM | POA: Diagnosis present

## 2013-02-17 DIAGNOSIS — I4891 Unspecified atrial fibrillation: Secondary | ICD-10-CM | POA: Diagnosis present

## 2013-02-17 DIAGNOSIS — Z91199 Patient's noncompliance with other medical treatment and regimen due to unspecified reason: Secondary | ICD-10-CM

## 2013-02-17 DIAGNOSIS — D509 Iron deficiency anemia, unspecified: Secondary | ICD-10-CM | POA: Diagnosis present

## 2013-02-17 DIAGNOSIS — F259 Schizoaffective disorder, unspecified: Secondary | ICD-10-CM | POA: Diagnosis present

## 2013-02-17 DIAGNOSIS — J9600 Acute respiratory failure, unspecified whether with hypoxia or hypercapnia: Secondary | ICD-10-CM | POA: Diagnosis present

## 2013-02-17 DIAGNOSIS — I10 Essential (primary) hypertension: Secondary | ICD-10-CM | POA: Diagnosis present

## 2013-02-17 DIAGNOSIS — E669 Obesity, unspecified: Secondary | ICD-10-CM | POA: Diagnosis present

## 2013-02-17 DIAGNOSIS — Z7982 Long term (current) use of aspirin: Secondary | ICD-10-CM

## 2013-02-17 DIAGNOSIS — R0600 Dyspnea, unspecified: Secondary | ICD-10-CM | POA: Diagnosis present

## 2013-02-17 DIAGNOSIS — Z6839 Body mass index (BMI) 39.0-39.9, adult: Secondary | ICD-10-CM

## 2013-02-17 DIAGNOSIS — L02419 Cutaneous abscess of limb, unspecified: Secondary | ICD-10-CM | POA: Diagnosis present

## 2013-02-17 DIAGNOSIS — R233 Spontaneous ecchymoses: Secondary | ICD-10-CM | POA: Diagnosis present

## 2013-02-17 DIAGNOSIS — D693 Immune thrombocytopenic purpura: Secondary | ICD-10-CM | POA: Diagnosis present

## 2013-02-17 DIAGNOSIS — L03119 Cellulitis of unspecified part of limb: Secondary | ICD-10-CM | POA: Diagnosis present

## 2013-02-17 DIAGNOSIS — M79604 Pain in right leg: Secondary | ICD-10-CM

## 2013-02-17 DIAGNOSIS — Z9119 Patient's noncompliance with other medical treatment and regimen: Secondary | ICD-10-CM

## 2013-02-17 DIAGNOSIS — Z79899 Other long term (current) drug therapy: Secondary | ICD-10-CM

## 2013-02-17 DIAGNOSIS — D696 Thrombocytopenia, unspecified: Secondary | ICD-10-CM | POA: Diagnosis present

## 2013-02-17 DIAGNOSIS — N179 Acute kidney failure, unspecified: Secondary | ICD-10-CM | POA: Diagnosis not present

## 2013-02-17 DIAGNOSIS — E876 Hypokalemia: Secondary | ICD-10-CM

## 2013-02-17 DIAGNOSIS — I509 Heart failure, unspecified: Secondary | ICD-10-CM | POA: Diagnosis present

## 2013-02-17 DIAGNOSIS — I5023 Acute on chronic systolic (congestive) heart failure: Principal | ICD-10-CM | POA: Diagnosis present

## 2013-02-17 DIAGNOSIS — M109 Gout, unspecified: Secondary | ICD-10-CM | POA: Diagnosis present

## 2013-02-17 LAB — COMPREHENSIVE METABOLIC PANEL
ALT: 29 U/L (ref 0–53)
Albumin: 3.7 g/dL (ref 3.5–5.2)
Alkaline Phosphatase: 61 U/L (ref 39–117)
BUN: 18 mg/dL (ref 6–23)
Calcium: 9.1 mg/dL (ref 8.4–10.5)
GFR calc Af Amer: 60 mL/min — ABNORMAL LOW (ref >90)
Glucose, Bld: 79 mg/dL (ref 70–99)
Potassium: 4 mEq/L (ref 3.5–5.1)
Sodium: 142 mEq/L (ref 135–145)
Total Protein: 6.9 g/dL (ref 6.0–8.3)

## 2013-02-17 LAB — BLOOD GAS, ARTERIAL
Acid-Base Excess: 2.8 mmol/L — ABNORMAL HIGH (ref 0.0–2.0)
Drawn by: 21694
O2 Content: 3 L/min
pCO2 arterial: 44.7 mmHg (ref 35.0–45.0)
pH, Arterial: 7.401 (ref 7.350–7.450)
pO2, Arterial: 69.1 mmHg — ABNORMAL LOW (ref 80.0–100.0)

## 2013-02-17 LAB — PRO B NATRIURETIC PEPTIDE: Pro B Natriuretic peptide (BNP): 1249 pg/mL — ABNORMAL HIGH (ref 0–125)

## 2013-02-17 LAB — PROTIME-INR
INR: 1.09 (ref 0.00–1.49)
Prothrombin Time: 14 seconds (ref 11.6–15.2)

## 2013-02-17 LAB — CBC WITH DIFFERENTIAL/PLATELET
Basophils Absolute: 0 10*3/uL (ref 0.0–0.1)
Basophils Relative: 0 % (ref 0–1)
Eosinophils Absolute: 0.2 10*3/uL (ref 0.0–0.7)
MCH: 27.4 pg (ref 26.0–34.0)
MCHC: 33.1 g/dL (ref 30.0–36.0)
Neutro Abs: 6.2 10*3/uL (ref 1.7–7.7)
Neutrophils Relative %: 74 % (ref 43–77)
RDW: 16.7 % — ABNORMAL HIGH (ref 11.5–15.5)

## 2013-02-17 LAB — LIPASE, BLOOD: Lipase: 61 U/L — ABNORMAL HIGH (ref 11–59)

## 2013-02-17 LAB — TROPONIN I: Troponin I: <0.3 ng/mL (ref <0.30)

## 2013-02-17 LAB — IRON AND TIBC
Saturation Ratios: 10 % — ABNORMAL LOW (ref 20–55)
TIBC: 377 ug/dL (ref 215–435)

## 2013-02-17 MED ORDER — OXYCODONE HCL 5 MG PO TABS
5.0000 mg | ORAL_TABLET | ORAL | Status: DC | PRN
  Filled 2013-02-17: qty 1

## 2013-02-17 MED ORDER — DEXAMETHASONE 10 MG/ML FOR PEDIATRIC ORAL USE
INTRAMUSCULAR | Status: AC
  Administered 2013-02-17: 10 mg via ORAL
  Filled 2013-02-17: qty 1

## 2013-02-17 MED ORDER — DILTIAZEM HCL 100 MG IV SOLR
5.0000 mg/h | INTRAVENOUS | Status: DC
  Administered 2013-02-17: 10 mg/h via INTRAVENOUS
  Administered 2013-02-17: 5 mg/h via INTRAVENOUS
  Administered 2013-02-18: 10 mg/h via INTRAVENOUS
  Filled 2013-02-17: qty 100

## 2013-02-17 MED ORDER — CARVEDILOL 12.5 MG PO TABS
12.5000 mg | ORAL_TABLET | Freq: Two times a day (BID) | ORAL | Status: DC
  Administered 2013-02-18 – 2013-02-19 (×4): 12.5 mg via ORAL
  Filled 2013-02-17 (×4): qty 1

## 2013-02-17 MED ORDER — ALBUTEROL SULFATE (5 MG/ML) 0.5% IN NEBU
2.5000 mg | INHALATION_SOLUTION | RESPIRATORY_TRACT | Status: DC | PRN
  Administered 2013-02-17: 2.5 mg via RESPIRATORY_TRACT
  Filled 2013-02-17: qty 0.5

## 2013-02-17 MED ORDER — ISOSORBIDE MONONITRATE ER 30 MG PO TB24
30.0000 mg | ORAL_TABLET | Freq: Every day | ORAL | Status: DC
  Administered 2013-02-17 – 2013-02-20 (×4): 30 mg via ORAL
  Filled 2013-02-17 (×4): qty 1

## 2013-02-17 MED ORDER — SENNOSIDES-DOCUSATE SODIUM 8.6-50 MG PO TABS: 1.0000 | ORAL_TABLET | Freq: Every evening | ORAL | Status: DC | PRN

## 2013-02-17 MED ORDER — DILTIAZEM HCL ER COATED BEADS 240 MG PO CP24
240.0000 mg | ORAL_CAPSULE | Freq: Every day | ORAL | Status: DC
  Administered 2013-02-18: 240 mg via ORAL
  Filled 2013-02-17 (×2): qty 1

## 2013-02-17 MED ORDER — FUROSEMIDE 10 MG/ML IJ SOLN
40.0000 mg | Freq: Two times a day (BID) | INTRAMUSCULAR | Status: DC
  Administered 2013-02-17 – 2013-02-18 (×2): 40 mg via INTRAVENOUS
  Filled 2013-02-17 (×2): qty 4

## 2013-02-17 MED ORDER — ONDANSETRON HCL 4 MG/2ML IJ SOLN: 4.0000 mg | Freq: Four times a day (QID) | INTRAMUSCULAR | Status: DC | PRN

## 2013-02-17 MED ORDER — LISINOPRIL 10 MG PO TABS
10.0000 mg | ORAL_TABLET | Freq: Every day | ORAL | Status: DC
  Administered 2013-02-17 – 2013-02-20 (×4): 10 mg via ORAL
  Filled 2013-02-17 (×4): qty 1

## 2013-02-17 MED ORDER — ONDANSETRON HCL 4 MG PO TABS: 4.0000 mg | ORAL_TABLET | Freq: Four times a day (QID) | ORAL | Status: DC | PRN

## 2013-02-17 MED ORDER — DILTIAZEM LOAD VIA INFUSION
15.0000 mg | Freq: Once | INTRAVENOUS | Status: AC
  Administered 2013-02-17: 15 mg via INTRAVENOUS
  Filled 2013-02-17: qty 15

## 2013-02-17 MED ORDER — ONDANSETRON HCL 4 MG/2ML IJ SOLN: 4.0000 mg | Freq: Three times a day (TID) | INTRAMUSCULAR | Status: DC | PRN

## 2013-02-17 MED ORDER — ALPRAZOLAM 0.25 MG PO TABS: 0.2500 mg | ORAL_TABLET | Freq: Three times a day (TID) | ORAL | Status: DC | PRN

## 2013-02-17 MED ORDER — ACETAMINOPHEN 650 MG RE SUPP: 650.0000 mg | Freq: Four times a day (QID) | RECTAL | Status: DC | PRN

## 2013-02-17 MED ORDER — DILTIAZEM LOAD VIA INFUSION
20.0000 mg | Freq: Once | INTRAVENOUS | Status: AC
  Administered 2013-02-17: 20 mg via INTRAVENOUS
  Filled 2013-02-17: qty 20

## 2013-02-17 MED ORDER — SODIUM CHLORIDE 0.9 % IJ SOLN
3.0000 mL | Freq: Two times a day (BID) | INTRAMUSCULAR | Status: DC
  Administered 2013-02-17 – 2013-02-20 (×6): 3 mL via INTRAVENOUS

## 2013-02-17 MED ORDER — DEXAMETHASONE 4 MG PO TABS
20.0000 mg | ORAL_TABLET | Freq: Two times a day (BID) | ORAL | Status: DC
  Administered 2013-02-17 – 2013-02-18 (×3): 20 mg via ORAL
  Filled 2013-02-17 (×3): qty 5

## 2013-02-17 MED ORDER — FUROSEMIDE 40 MG PO TABS
40.0000 mg | ORAL_TABLET | Freq: Once | ORAL | Status: AC
  Administered 2013-02-17: 40 mg via ORAL
  Filled 2013-02-17: qty 1

## 2013-02-17 MED ORDER — POTASSIUM CHLORIDE CRYS ER 20 MEQ PO TBCR
40.0000 meq | EXTENDED_RELEASE_TABLET | Freq: Every day | ORAL | Status: DC
  Administered 2013-02-17 – 2013-02-20 (×4): 40 meq via ORAL
  Filled 2013-02-17 (×4): qty 2

## 2013-02-17 MED ORDER — DOXYCYCLINE HYCLATE 100 MG PO TABS
100.0000 mg | ORAL_TABLET | Freq: Two times a day (BID) | ORAL | Status: DC
  Administered 2013-02-17 – 2013-02-19 (×4): 100 mg via ORAL
  Filled 2013-02-17 (×4): qty 1

## 2013-02-17 MED ORDER — ACETAMINOPHEN 325 MG PO TABS: 650.0000 mg | ORAL_TABLET | Freq: Four times a day (QID) | ORAL | Status: DC | PRN

## 2013-02-17 MED ORDER — DEXAMETHASONE SODIUM PHOSPHATE 10 MG/ML IJ SOLN
INTRAMUSCULAR | Status: AC
  Filled 2013-02-17: qty 1

## 2013-02-17 NOTE — ED Notes (Signed)
20mg  bolus finished. Awaiting to evaluate HR per order

## 2013-02-17 NOTE — Patient Instructions (Signed)
Your physician recommends that you schedule a follow-up appointment in:  

## 2013-02-17 NOTE — Progress Notes (Signed)
Patient ID: Todd Page, male   DOB: September 26, 1951, 62 y.o.   MRN: [id number] Cancelled

## 2013-02-17 NOTE — Progress Notes (Signed)
Patient ID: Todd Page, male   DOB: 12/17/1950, 62 y.o.   MRN: [id number] No show 

## 2013-02-17 NOTE — H&P (Signed)
[name] History and Physical  Todd Page:[id number] DOB: Feb 22, 1951 DOA: 02/17/2013  Referring physician:  PCP: No PCP Per Patient  Specialists:   Chief Complaint: shortness of breath  HPI: Todd Page is a 62 y.o. male pmhx CHF, afib, HTN, non-compliance, schizoaffective disorder, obesity who presents to ED with cc shortness of breath. Information obtained from pt. States worsening sob started this am. He called EMS who came and "checked me out" and left. He states he developed bilateral side pain. Denies CP, palpitation. Denies cough. Denies abdominal swelling but reports worsening bilateral lower extremity edema with erythema. States he props up on 2 pillows to sleep. Denies fever, chills nausea/vomiting or diarrhea. Reports he has not taken medication in "several days" as he does not have them. He also reports that he is not sure why he has not shown up for his follow up apt. With cardiology. In ED pt found to be in afib with RVR, chest xray consistent with CHF. [facility] asked to admit. Platelet count is 20K, which is a new problem.  Chronic noncompliance.   Review of Systems: The patient denies anorexia, fever, weight loss,, vision loss, decreased hearing, hoarseness, chest pain, syncope, balance deficits, hemoptysis, melena, hematochezia, severe indigestion/heartburn, hematuria, incontinence, genital sores, muscle weakness,  transient blindness, difficulty walking, depression, unusual weight change, abnormal bleeding, enlarged lymph nodes, angioedema, and breast masses.    Past Medical History  Diagnosis Date  . Hypertension     minimal coronary atherosclerosis by CT  . Schizoaffective disorder     With depression  . Atrial fibrillation 06/2012    onset 06/2012; normal TSH; EF-45%  . Overweight   . Gout   . Noncompliance 09/21/2012  . Cholelithiasis     incidental finding on CT in 2013  . Systolic dysfunction    Past Surgical History  Procedure Laterality Date  . Cardiac  surgery      ? history accurate; no apparent incisions  . Colonoscopy  2006    normal screening examination   Social History:  reports that he has never smoked. He does not have any smokeless tobacco history on file. He reports that  drinks alcohol. He reports that he does not use illicit drugs. Lives at home with his mother. Is on disability No Known Allergies  Family History  Problem Relation Age of Onset  . Hypertension Mother   . Liver disease Father      Prior to Admission medications   Medication Sig Start Date End Date Taking? Authorizing Provider  ibuprofen (ADVIL,MOTRIN) 200 MG tablet Take 400 mg by mouth every 6 (six) hours as needed for pain.   Yes Historical Provider, MD  ALPRAZolam (XANAX) 0.25 MG tablet Take 0.25 mg by mouth 3 (three) times daily as needed for sleep.    Historical Provider, MD  aspirin 81 MG tablet Take 1 tablet (81 mg total) by mouth daily. 01/21/13   Erick Blinks, MD  carvedilol (COREG) 12.5 MG tablet Take 1 tablet (12.5 mg total) by mouth 2 (two) times daily with a meal. 02/16/13   Jodelle Gross, NP  diltiazem (CARDIZEM LA) 240 MG 24 hr tablet Take 1 tablet (240 mg total) by mouth at bedtime. For treatment of your elevated heart rate and high blood pressure. 02/16/13   Jodelle Gross, NP  furosemide (LASIX) 20 MG tablet Take 2 tablets (40 mg total) by mouth daily. 02/16/13   Jodelle Gross, NP  isosorbide mononitrate (IMDUR) 30 MG 24 hr tablet  Take 1 tablet (30 mg total) by mouth daily. 02/16/13   Jodelle Gross, NP  lisinopril (PRINIVIL,ZESTRIL) 10 MG tablet Take 1 tablet (10 mg total) by mouth daily. For treatment of your high blood pressure and your heart. 02/16/13   Jodelle Gross, NP  potassium chloride SA (K-DUR,KLOR-CON) 20 MEQ tablet Take 2 tablets (40 mEq total) by mouth daily. 02/16/13   Jodelle Gross, NP   Physical Exam: Filed Vitals:   02/17/13 1145 02/17/13 1215 02/17/13 1230 02/17/13 1245  BP: 178/111 160/104 137/84  156/90  Pulse: 81 99 93 114  Temp:      TempSrc:      Resp:   26 24  SpO2: 98% 96% 98% 99%     General:  Obese, alert tachypneic, mild respiratory distress  Eyes: PERRL EOMI no scleral icterus  ENT: ears clear with healing excoriations, nose without drainage. Mucus membranes mouth slightly pale with exception of area under tongue very pink.   Neck: supple no JVD no lymphadenopathy   Cardiovascular: irregularly irregular, no MGR bilateral lower extremities with erythema from shin to feet. No warmth.  PPP bilaterally. 2+ pitting edema  Respiratory: moderate increased work of breathing with conversation. BS somewhat distant bilaterally with crackles bilateral bases. No wheeze. Using abdominal accessory muscles  Abdomen: obese soft +BS non-tender to palpation. Difficult to palpate for organomegaly due to body habitus  Skin: cool dry, petechia  and lower extremities, and diffuse erythema of the feet. Healing excoriations on lower extremities.   Musculoskeletal: no clubbing. No joint swelling/tenderness  Psychiatric: cooperative, calm, occasionally defensive and evasive  Neurologic: oriented x3. Speech clear. Facial symmetry  Labs on Admission:  Basic Metabolic Panel:  Recent Labs Lab 02/17/13 0945  NA 142  K 4.0  CL 104  CO2 29  GLUCOSE 79  BUN 18  CREATININE 1.42*  CALCIUM 9.1   Liver Function Tests:  Recent Labs Lab 02/17/13 0945  AST 21  ALT 29  ALKPHOS 61  BILITOT 0.9  PROT 6.9  ALBUMIN 3.7    Recent Labs Lab 02/17/13 0945  LIPASE 61*   No results found for this basename: AMMONIA,  in the last 168 hours CBC:  Recent Labs Lab 02/17/13 0945  WBC 8.4  NEUTROABS 6.2  HGB 13.1  HCT 39.6  MCV 82.8  PLT 23*   Cardiac Enzymes:  Recent Labs Lab 02/17/13 0945  TROPONINI <0.30    BNP (last 3 results)  Recent Labs  01/16/13 0758 01/19/13 0500 02/17/13 0945  PROBNP 815.6* 242.1* 1249.0*   CBG: No results found for this basename: GLUCAP,   in the last 168 hours  Radiological Exams on Admission: Dg Chest 2 View  02/17/2013  *RADIOLOGY REPORT*  Clinical Data: Short of breath  CHEST - 2 VIEW  Comparison: Prior chest x-ray 01/18/2013  Findings: Slightly increased pulmonary edema compared to prior. The degree of enlargement the cardiopericardial silhouette is stable.  Bibasilar opacities are noted.  There are bilateral layering pleural effusions.  No acute osseous abnormality.  IMPRESSION: Cardiomegaly, mild interstitial pulmonary edema and small bilateral effusions most consistent with underlying CHF.  Bibasilar opacities favored to reflect pleural fluid with atelectasis.  Superimposed infiltrate is difficult to exclude radiographically.   Original Report Authenticated By: Malachy Moan, M.D.     EKG: Independently reviewed.  afib with RVR  Assessment/Plan Principal Problem:   Acute on chronic systolic HF (heart failure):due to non-compliance with meds. Will admit to SD. Will continue IV lasix for diuresing.  Will maintain strict intake and output. Will weigh daily. Last echo 9/13 with EF 45-50% and severe hypokinesis. Restart previous meds. CHF teaching.  Active Problems Atrial fibrillation with rapid ventricular response: due to not taking meds. Will continue cardizem drip started in ED. Will titrate to HR. Once HR controlled will transition to po.   HTN: uncontrolled. Improved in ED with cardizem and lasix. Last discharge pt on coreg, diltazem, lasix and lisinopril for control  Thrombocytopenia, new. No obvious bleeding noted. The rest of his CBC is unremarkable. CT of 1/14 reviewed with Dr. Pia Mau. No evidence of cirrhosis or portal hypertension. speen only mildly enlarged. Concerned about ITP. Discussed with Dr. Laurie Panda. We will give pulse dose Decadron per his recommendations and he will consult. Will obtain sed rate, ANA, HIV. Will check anemia panel as well. Rheumatoid factor erythrocyte sedimentation rate  Lower extremity  edema and erythema: hx of edema. Mainly petechiae, but also erythematous areas on the feet may be cellulitis. Will elevate, given doxycycline by mouth and monitor.  Obesity: nutritional consult  Schizoaffective disorder appears at baseline. Not on meds currently. Patient has been chronically noncompliant with medications. [facility] consult was obtained previously which was not terribly helpful with respect to medication management.   Noncompliance: hx of same. Will request SW and case management for evaluation of ability to get meds and get to appointment.   Code Status: full Family Communication: none available Disposition Plan: home when ready  Time spent: 60 minutes  Gwenyth Bender [name] Pager [phone]  If 7PM-7AM, please contact night-coverage [email or web address] Password [facility] 02/17/2013, 2:01 PM  Attending note  Patient interviewed and examined with Ms. Vedia Coffer. Note annotated in bold. Agree.   Clearly, this is patient's usual pattern of running out of his medications and presenting to the emergency room acutely ill in CHF, hypertensive urgency and a atrial fibrillation with RVR. He is not a Coumadin candidate due to chronic noncompliance, and now with thrombocytopenia. We discussed the importance of followup appointments. In reviewing outpatient records, he no showed to the cardiology office 3 times. He was admitted last month and had run out of his medications at that time as well. Would likely benefit from outpatient case management. Will discuss with [facility] care management team. Also, he has no primary care provider. Previously, he had seen Dr. Georgann Housekeeper, but today reports that he has no recollection of having seen him which is obviously not the case, as there are extensive records in e chart.  Crista Curb, M.D.

## 2013-02-17 NOTE — ED Notes (Signed)
Pt Cardizem increased to 45ml/hr due to b/p 172/112 P108.

## 2013-02-17 NOTE — ED Provider Notes (Signed)
History  This chart was scribed for Nelia Shi, MD by Ardeen Jourdain, ED Scribe. This patient was seen in room APA07/APA07 and the patient's care was started at 0936.  CSN: [id number]  Arrival date & time 02/17/13  7829   First MD Initiated Contact with Patient 02/17/13 [phone]      Chief Complaint  Patient presents with  . Abdominal Pain  . Shortness of Breath     The history is provided by the patient. No language interpreter was used.    Todd Page is a 62 y.o. male with a h/o A-fib, HTN and schizoaffective disorder who presents to the Emergency Department complaining of gradual onset, gradually worsening, constant SOB and abdominal pain. He states the symptoms have been occuring for "some time now." He describes the abdominal pain as a cramping sensation. He states the pain is aggravated by standing. He states the SOB is aggravated by bending over. He states he has not been taking his medications. He states he has not been able to get them filled.   Past Medical History  Diagnosis Date  . Hypertension     minimal coronary atherosclerosis by CT  . Schizoaffective disorder     With depression  . Atrial fibrillation 06/2012    onset 06/2012; normal TSH; EF-45%  . Overweight   . Gout   . Noncompliance 09/21/2012  . Cholelithiasis     incidental finding on CT in 2013  . Systolic dysfunction     Past Surgical History  Procedure Laterality Date  . Cardiac surgery      ? history accurate; no apparent incisions  . Colonoscopy  2006    normal screening examination    Family History  Problem Relation Age of Onset  . Hypertension Mother   . Liver disease Father     History  Substance Use Topics  . Smoking status: Never Smoker   . Smokeless tobacco: Not on file  . Alcohol Use: Yes     Comment: former       Review of Systems  Constitutional: Negative for fever and chills.  Respiratory: Positive for shortness of breath.   Gastrointestinal: Positive for abdominal  pain. Negative for nausea and vomiting.  Neurological: Negative for weakness.  All other systems reviewed and are negative.    Allergies  Review of patient's allergies indicates no known allergies.  Home Medications   Current Outpatient Rx  Name  Route  Sig  Dispense  Refill  . ibuprofen (ADVIL,MOTRIN) 200 MG tablet   Oral   Take 400 mg by mouth every 6 (six) hours as needed for pain.         Marland Kitchen ALPRAZolam (XANAX) 0.25 MG tablet   Oral   Take 0.25 mg by mouth 3 (three) times daily as needed for sleep.         Marland Kitchen aspirin 81 MG tablet   Oral   Take 1 tablet (81 mg total) by mouth daily.   30 tablet   1   . carvedilol (COREG) 12.5 MG tablet   Oral   Take 1 tablet (12.5 mg total) by mouth 2 (two) times daily with a meal.   60 tablet   0   . diltiazem (CARDIZEM LA) 240 MG 24 hr tablet   Oral   Take 1 tablet (240 mg total) by mouth at bedtime. For treatment of your elevated heart rate and high blood pressure.   30 tablet   0   . furosemide (LASIX)  20 MG tablet   Oral   Take 2 tablets (40 mg total) by mouth daily.   60 tablet   0   . isosorbide mononitrate (IMDUR) 30 MG 24 hr tablet   Oral   Take 1 tablet (30 mg total) by mouth daily.   30 tablet   0   . lisinopril (PRINIVIL,ZESTRIL) 10 MG tablet   Oral   Take 1 tablet (10 mg total) by mouth daily. For treatment of your high blood pressure and your heart.   30 tablet   0   . potassium chloride SA (K-DUR,KLOR-CON) 20 MEQ tablet   Oral   Take 2 tablets (40 mEq total) by mouth daily.   60 tablet   0     Triage Vitals: BP 173/111  Pulse 145  Temp(Src) 97.9 F (36.6 C) (Oral)  Resp 33  SpO2 96%  Physical Exam  Nursing note and vitals reviewed. Constitutional: He is oriented to person, place, and time. He appears well-developed and well-nourished. No distress.  HENT:  Head: Normocephalic and atraumatic.  Eyes: Pupils are equal, round, and reactive to light.  Neck: Normal range of motion.   Cardiovascular: Normal rate and intact distal pulses.   Pulmonary/Chest: He is in respiratory distress. He has rales (At bases bilaterally).  Abdominal: Normal appearance. He exhibits no distension.  Musculoskeletal: Normal range of motion.  Neurological: He is alert and oriented to person, place, and time. No cranial nerve deficit.  Skin: Skin is warm and dry. No rash noted.  Psychiatric: He has a normal mood and affect. His behavior is normal.    ED Course  Procedures (including critical care time)  DIAGNOSTIC STUDIES:  Date: 02/17/2013  Rate: 125  Rhythm: Atrial fibrillation with rapid ventricular response  QRS Axis: normal  Intervals: normal  ST/T Wave abnormalities: normal  Conduction Disutrbances: none  Narrative Interpretation: Abnormal EKG  Meds ordered this encounter  Medications  . diltiazem (CARDIZEM) 1 mg/mL load via infusion 20 mg    Sig:   . diltiazem (CARDIZEM) 1 mg/mL load via infusion 15 mg    Sig:   . diltiazem (CARDIZEM) 100 mg in dextrose 5 % 100 mL infusion    Sig:   . furosemide (LASIX) tablet 40 mg    Sig:     CRITICAL CARE Performed by: Nelva Nay L   Total critical care time: 30 min  Critical care time was exclusive of separately billable procedures and treating other patients.  Critical care was necessary to treat or prevent imminent or life-threatening deterioration.  Critical care was time spent personally by me on the following activities: development of treatment plan with patient and/or surrogate as well as nursing, discussions with consultants, evaluation of patient's response to treatment, examination of patient, obtaining history from patient or surrogate, ordering and performing treatments and interventions, ordering and review of laboratory studies, ordering and review of radiographic studies, pulse oximetry and re-evaluation of patient's condition.   COORDINATION OF CARE:  10:01 AM-Discussed treatment plan which includes CBC,  CMP, troponin, protime, lipase and CXR with pt at bedside and pt agreed to plan.    Labs Reviewed  CBC WITH DIFFERENTIAL - Abnormal; Notable for the following:    RDW 16.7 (*)    Platelets 23 (*)    All other components within normal limits  COMPREHENSIVE METABOLIC PANEL - Abnormal; Notable for the following:    Creatinine, Ser 1.42 (*)    GFR calc non Af Amer 52 (*)    GFR  calc Af Amer 60 (*)    All other components within normal limits  LIPASE, BLOOD - Abnormal; Notable for the following:    Lipase 61 (*)    All other components within normal limits  PRO B NATRIURETIC PEPTIDE - Abnormal; Notable for the following:    Pro B Natriuretic peptide (BNP) 1249.0 (*)    All other components within normal limits  TROPONIN I  PROTIME-INR   Dg Chest 2 View  02/17/2013  *RADIOLOGY REPORT*  Clinical Data: Short of breath  CHEST - 2 VIEW  Comparison: Prior chest x-ray 01/18/2013  Findings: Slightly increased pulmonary edema compared to prior. The degree of enlargement the cardiopericardial silhouette is stable.  Bibasilar opacities are noted.  There are bilateral layering pleural effusions.  No acute osseous abnormality.  IMPRESSION: Cardiomegaly, mild interstitial pulmonary edema and small bilateral effusions most consistent with underlying CHF.  Bibasilar opacities favored to reflect pleural fluid with atelectasis.  Superimposed infiltrate is difficult to exclude radiographically.   Original Report Authenticated By: Malachy Moan, M.D.      1. Acute on chronic systolic HF (heart failure)   2. Atrial fibrillation with rapid ventricular response   3. Thrombocytopenia   4. Malignant hypertension       MDM  I personally performed the services described in this documentation, which was scribed in my presence. The recorded information has been reviewed and considered.       Nelia Shi, MD 02/17/13 1233

## 2013-02-17 NOTE — ED Notes (Signed)
Meal tray given 

## 2013-02-17 NOTE — ED Notes (Signed)
CRITICAL VALUE ALERT  Critical value received:  Platelet 24,000  Date of notification:  02/17/13  Time of notification:  1457  Critical value read back:yes  Nurse who received alert:  Santiago Bur, RN  MD notified (1st page):    Time of first page:    MD notified (2nd page):  Time of second page:  Responding MD:  Dr. Nelva Nay  Time MD responded:  [phone]

## 2013-02-17 NOTE — ED Notes (Signed)
Dr.Sullivan aware of new platelet count.

## 2013-02-17 NOTE — ED Notes (Addendum)
Pt called EMS initially for SOB but cancelled and calledn 2nd time for right side belly pain  Elevated b/p  210/106 with EMS and  EKG showed A-Fib and  HR in140s. Pt shortness of breath has been ongoing.

## 2013-02-17 NOTE — ED Notes (Signed)
CRITICAL VALUE ALERT  Critical value received:  Platelets 23000  Date of notification:  02/17/2013  Time of notification:  1020  Critical value read back: yes  Nurse who received alert:  Arva Chafe RN  MD notified (1st page):  Radford Pax  Time of first page:  1020  MD notified (2nd page):  Time of second page:  Responding MD:  Radford Pax  Time MD responded:  1020

## 2013-02-17 NOTE — Progress Notes (Signed)
Patient ID: Todd Page, male   DOB: Jun 23, 1951, 62 y.o.   MRN: [id number] NoShow

## 2013-02-18 ENCOUNTER — Inpatient Hospital Stay (HOSPITAL_COMMUNITY): Payer: Medicare Other

## 2013-02-18 ENCOUNTER — Encounter (HOSPITAL_COMMUNITY): Payer: Self-pay | Admitting: *Deleted

## 2013-02-18 DIAGNOSIS — I509 Heart failure, unspecified: Secondary | ICD-10-CM

## 2013-02-18 DIAGNOSIS — F259 Schizoaffective disorder, unspecified: Secondary | ICD-10-CM

## 2013-02-18 DIAGNOSIS — D509 Iron deficiency anemia, unspecified: Secondary | ICD-10-CM

## 2013-02-18 DIAGNOSIS — I10 Essential (primary) hypertension: Secondary | ICD-10-CM

## 2013-02-18 DIAGNOSIS — L03119 Cellulitis of unspecified part of limb: Secondary | ICD-10-CM

## 2013-02-18 LAB — CBC
Hemoglobin: 12.3 g/dL — ABNORMAL LOW (ref 13.0–17.0)
MCH: 27.6 pg (ref 26.0–34.0)
MCV: 82.3 fL (ref 78.0–100.0)
RBC: 4.46 MIL/uL (ref 4.22–5.81)

## 2013-02-18 LAB — BASIC METABOLIC PANEL
CO2: 27 mEq/L (ref 19–32)
Calcium: 9 mg/dL (ref 8.4–10.5)
Chloride: 103 mEq/L (ref 96–112)
Creatinine, Ser: 1.23 mg/dL (ref 0.50–1.35)
Glucose, Bld: 184 mg/dL — ABNORMAL HIGH (ref 70–99)
Sodium: 139 mEq/L (ref 135–145)

## 2013-02-18 LAB — GLUCOSE, CAPILLARY
Glucose-Capillary: 157 mg/dL — ABNORMAL HIGH (ref 70–99)
Glucose-Capillary: 182 mg/dL — ABNORMAL HIGH (ref 70–99)

## 2013-02-18 LAB — FERRITIN: Ferritin: 137 ng/mL (ref 22–322)

## 2013-02-18 LAB — ANA: Anti Nuclear Antibody(ANA): NEGATIVE

## 2013-02-18 MED ORDER — LIVING BETTER WITH HEART FAILURE BOOK
Freq: Once | Status: AC
  Administered 2013-02-18: 11:00:00
  Filled 2013-02-18: qty 1

## 2013-02-18 MED ORDER — SODIUM CHLORIDE 0.9 % IV SOLN
1020.0000 mg | Freq: Once | INTRAVENOUS | Status: AC
  Administered 2013-02-18: 1020 mg via INTRAVENOUS
  Filled 2013-02-18: qty 34

## 2013-02-18 MED ORDER — FUROSEMIDE 10 MG/ML IJ SOLN
80.0000 mg | Freq: Two times a day (BID) | INTRAMUSCULAR | Status: DC
  Administered 2013-02-18 – 2013-02-19 (×3): 80 mg via INTRAVENOUS
  Filled 2013-02-18 (×3): qty 8

## 2013-02-18 NOTE — Consult Note (Signed)
[facility] Consultation Oncology  Name: Todd Page      MRN: [id number]    Location: IC04/IC04-01  Date: 02/18/2013 Time:12:14 PM   REFERRING PHYSICIAN:  Crista Curb, MD  REASON FOR CONSULT:  Thrombocytopenia  HISTORY OF PRESENT ILLNESS:   This is a 62 year old, chronically-ill appearing, caucasian man who reported to the ED on 02/17/2013 for shortness of breath and he was noted to be thrombocytopenic at 23,000.  He has a past medical history significant for schizoaffective disorder, obesity, noncompliance, HTN, CHF, and Atrial fibrillation with RVR.  Baily is a poor historian.  He reports that he lives at home with his mother.  He started to notice a rash a week or so ago.  He denies any pain or pruritis associated with the rash.  IT was located on the lower legs.  There are multiple excoriations on his LE but he denies causing them himself.   He denies any new medications, but does admit to taking Advil and ASA occassionally.  He denies any new prescription medications.  He denies any vitamins or supplements.   He does not have any pets at home.  He denies any tick bites.   He does not have a PCP and therefore receives his medications through hospitalizations.  He admits that he ran out of his medications some time ago and never followed up with cardiology as instructed.   He denies any headaches, dizziness, double vision, fevers, chills, night sweats, nasuea, vomiting, diarrhea, constipation, abdominal pain, chest pain, heart palpitations, abdominal pain, change in bowel habit, blood in stool, black tarry stool, urinary pain/bruning/frequency, hematuria.  He does admit to SOB and wheezing.   PAST MEDICAL HISTORY:   Past Medical History  Diagnosis Date  . Hypertension     minimal coronary atherosclerosis by CT  . Schizoaffective disorder     With depression  . Atrial fibrillation 06/2012    onset 06/2012; normal TSH; EF-45%  . Overweight   . Gout   . Noncompliance  09/21/2012  . Cholelithiasis     incidental finding on CT in 2013  . Systolic dysfunction     ALLERGIES: No Known Allergies    MEDICATIONS: I have reviewed the patient's current medications.     PAST SURGICAL HISTORY Past Surgical History  Procedure Laterality Date  . Cardiac surgery      ? history accurate; no apparent incisions  . Colonoscopy  2006    normal screening examination    FAMILY HISTORY: Family History  Problem Relation Age of Onset  . Hypertension Mother   . Liver disease Father     SOCIAL HISTORY:  reports that he has never smoked. He does not have any smokeless tobacco history on file. He reports that  drinks alcohol. He reports that he does not use illicit drugs.  PERFORMANCE STATUS: The patient's performance status is 2 - Symptomatic, <50% confined to bed  PHYSICAL EXAM: Most Recent Vital Signs: Blood pressure 116/82, pulse 80, temperature 97.9 F (36.6 C), temperature source Oral, resp. rate 28, height 6\' 3"  (1.905 m), weight 317 lb 10.9 oz (144.1 kg), SpO2 94.00%. General appearance: alert, cooperative, appears older than stated age, no distress, morbidly obese and poor historian Head: Normocephalic, without obvious abnormality, atraumatic Eyes: negative findings: lids and lashes normal, conjunctivae and sclerae normal and pupils equal, round, reactive to light and accomodation Neck: no adenopathy and supple, symmetrical, trachea midline Back: symmetric, no curvature. ROM normal. No CVA tenderness., erythematous lesions measuring  less than 5 mm, more on superior portion of back Lungs: wheezes bilaterally Chest wall: erythematous lesions less than 5 mm more on right than left Heart: irregularly irregular rhythm Abdomen: abnormal findings:  obese and erythematous lesions measuring less than 5 mm that are erythematous Extremities: B/L Lower leg cellulitis with B/L LE edema and petechial rash with complications of self-inflicted excoritation Skin:  petechiae - lower leg(s) bilateral and rash as decribed above Lymph nodes: no neck lymphadenopathy noted.  No supraclavicular or infraclavicular lymphadenopathy noted Neurologic: Grossly normal  LABORATORY DATA:  Results for orders placed during the hospital encounter of 02/17/13 (from the past 48 hour(s))  CBC WITH DIFFERENTIAL     Status: Abnormal   Collection Time    02/17/13  9:45 AM      Result Value Range   WBC 8.4  4.0 - 10.5 K/uL   RBC 4.78  4.22 - 5.81 MIL/uL   Hemoglobin 13.1  13.0 - 17.0 g/dL   HCT 16.1  09.6 - 04.5 %   MCV 82.8  78.0 - 100.0 fL   MCH 27.4  26.0 - 34.0 pg   MCHC 33.1  30.0 - 36.0 g/dL   RDW 40.9 (*) 81.1 - 91.4 %   Platelets 23 (*) 150 - 400 K/uL   Comment: RESULT REPEATED AND VERIFIED     PLATELET COUNT CONFIRMED BY SMEAR     CRITICAL RESULT CALLED TO, READ BACK BY AND VERIFIED WITH:     WILSON A RN ON [id number] AT 1030 BY RESSEGGER R   Neutrophils Relative 74  43 - 77 %   Neutro Abs 6.2  1.7 - 7.7 K/uL   Lymphocytes Relative 16  12 - 46 %   Lymphs Abs 1.4  0.7 - 4.0 K/uL   Monocytes Relative 7  3 - 12 %   Monocytes Absolute 0.6  0.1 - 1.0 K/uL   Eosinophils Relative 2  0 - 5 %   Eosinophils Absolute 0.2  0.0 - 0.7 K/uL   Basophils Relative 0  0 - 1 %   Basophils Absolute 0.0  0.0 - 0.1 K/uL   WBC Morphology PLATELETS APPEAR DECREASED    COMPREHENSIVE METABOLIC PANEL     Status: Abnormal   Collection Time    02/17/13  9:45 AM      Result Value Range   Sodium 142  135 - 145 mEq/L   Potassium 4.0  3.5 - 5.1 mEq/L   Chloride 104  96 - 112 mEq/L   CO2 29  19 - 32 mEq/L   Glucose, Bld 79  70 - 99 mg/dL   BUN 18  6 - 23 mg/dL   Creatinine, Ser 7.82 (*) 0.50 - 1.35 mg/dL   Calcium 9.1  8.4 - 95.6 mg/dL   Total Protein 6.9  6.0 - 8.3 g/dL   Albumin 3.7  3.5 - 5.2 g/dL   AST 21  0 - 37 U/L   ALT 29  0 - 53 U/L   Alkaline Phosphatase 61  39 - 117 U/L   Total Bilirubin 0.9  0.3 - 1.2 mg/dL   GFR calc non Af Amer 52 (*) >90 mL/min   GFR calc Af Amer  60 (*) >90 mL/min   Comment:            The eGFR has been calculated     using the CKD EPI equation.     This calculation has not been     validated in all clinical  situations.     eGFR's persistently     <90 mL/min signify     possible Chronic Kidney Disease.  TROPONIN I     Status: None   Collection Time    02/17/13  9:45 AM      Result Value Range   Troponin I <0.30  <0.30 ng/mL   Comment:            Due to the release kinetics of cTnI,     a negative result within the first hours     of the onset of symptoms does not rule out     myocardial infarction with certainty.     If myocardial infarction is still suspected,     repeat the test at appropriate intervals.  PROTIME-INR     Status: None   Collection Time    02/17/13  9:45 AM      Result Value Range   Prothrombin Time 14.0  11.6 - 15.2 seconds   INR 1.09  0.00 - 1.49  LIPASE, BLOOD     Status: Abnormal   Collection Time    02/17/13  9:45 AM      Result Value Range   Lipase 61 (*) 11 - 59 U/L  PRO B NATRIURETIC PEPTIDE     Status: Abnormal   Collection Time    02/17/13  9:45 AM      Result Value Range   Pro B Natriuretic peptide (BNP) 1249.0 (*) 0 - 125 pg/mL  PLATELET COUNT     Status: Abnormal   Collection Time    02/17/13  2:25 PM      Result Value Range   Platelets 24 (*) 150 - 400 K/uL   Comment: RESULT REPEATED AND VERIFIED     PLATELET COUNT PERFORMED ON CITRATED BLOOD     CRITICAL RESULT CALLED TO, READ BACK BY AND VERIFIED WITH:     Santiago Bur RN ON [id number] AT 1455 BY RESSEGGER R  HIV ANTIBODY (ROUTINE TESTING)     Status: None   Collection Time    02/17/13  2:27 PM      Result Value Range   HIV NON REACTIVE  NON REACTIVE  VITAMIN B12     Status: None   Collection Time    02/17/13  2:27 PM      Result Value Range   Vitamin B-12 282  211 - 911 pg/mL  FOLATE     Status: None   Collection Time    02/17/13  2:27 PM      Result Value Range   Folate 17.8     Comment: (NOTE)     Reference  Ranges            Deficient:       0.4 - 3.3 ng/mL            Indeterminate:   3.4 - 5.4 ng/mL            Normal:              > 5.4 ng/mL  IRON AND TIBC     Status: Abnormal   Collection Time    02/17/13  2:27 PM      Result Value Range   Iron 37 (*) 42 - 135 ug/dL   TIBC 045  409 - 811 ug/dL   Saturation Ratios 10 (*) 20 - 55 %   UIBC 340  125 - 400 ug/dL  FERRITIN     Status: None  Collection Time    02/17/13  2:27 PM      Result Value Range   Ferritin 137  22 - 322 ng/mL  ANA     Status: None   Collection Time    02/17/13  2:27 PM      Result Value Range   ANA NEGATIVE  NEGATIVE  SEDIMENTATION RATE     Status: Abnormal   Collection Time    02/17/13  2:27 PM      Result Value Range   Sed Rate 30 (*) 0 - 16 mm/hr  RHEUMATOID FACTOR     Status: None   Collection Time    02/17/13  2:27 PM      Result Value Range   Rheumatoid Factor <10  <=14 IU/mL   Comment: (NOTE)                             Interpretive Table                        Low Positive: 15 - 41 IU/mL                        High Positive:  >= 42 IU/mL     In addition to the RF result, and clinical symptoms including joint     involvement, the 2010 ACR Classification Criteria for     scoring/diagnosing Rheumatoid Arthritis include the results of the     following tests:  CRP (16109), ESR (15010), and CCP (APCA) (60454).     www.rheumatology.org/practice/clinical/classification/ra/ra_2010.asp  MRSA PCR SCREENING     Status: None   Collection Time    02/17/13  7:35 PM      Result Value Range   MRSA by PCR NEGATIVE  NEGATIVE   Comment:            The GeneXpert MRSA Assay (FDA     approved for NASAL specimens     only), is one component of a     comprehensive MRSA colonization     surveillance program. It is not     intended to diagnose MRSA     infection nor to guide or     monitor treatment for     MRSA infections.  BLOOD GAS, ARTERIAL     Status: Abnormal   Collection Time    02/17/13 10:44 PM       Result Value Range   O2 Content 3.0     Delivery systems NASAL CANNULA     pH, Arterial 7.401  7.350 - 7.450   pCO2 arterial 44.7  35.0 - 45.0 mmHg   pO2, Arterial 69.1 (*) 80.0 - 100.0 mmHg   Bicarbonate 27.2 (*) 20.0 - 24.0 mEq/L   TCO2 24.3  0 - 100 mmol/L   Acid-Base Excess 2.8 (*) 0.0 - 2.0 mmol/L   O2 Saturation 93.0     Patient temperature 37.0     Collection site LEFT RADIAL     Drawn by [id number]     Sample type ARTERIAL     Allens test (pass/fail) PASS  PASS  CBC     Status: Abnormal   Collection Time    02/18/13  4:51 AM      Result Value Range   WBC 4.2  4.0 - 10.5 K/uL   RBC 4.46  4.22 - 5.81 MIL/uL   Hemoglobin 12.3 (*) 13.0 -  17.0 g/dL   HCT 04.5 (*) 40.9 - 81.1 %   MCV 82.3  78.0 - 100.0 fL   MCH 27.6  26.0 - 34.0 pg   MCHC 33.5  30.0 - 36.0 g/dL   RDW 91.4 (*) 78.2 - 95.6 %   Platelets 32 (*) 150 - 400 K/uL   Comment: DELTA CHECK NOTED     RESULT REPEATED AND VERIFIED     PLATELET COUNT CONFIRMED BY SMEAR  BASIC METABOLIC PANEL     Status: Abnormal   Collection Time    02/18/13  4:51 AM      Result Value Range   Sodium 139  135 - 145 mEq/L   Potassium 4.0  3.5 - 5.1 mEq/L   Chloride 103  96 - 112 mEq/L   CO2 27  19 - 32 mEq/L   Glucose, Bld 184 (*) 70 - 99 mg/dL   BUN 22  6 - 23 mg/dL   Creatinine, Ser 2.13  0.50 - 1.35 mg/dL   Calcium 9.0  8.4 - 08.6 mg/dL   GFR calc non Af Amer 62 (*) >90 mL/min   GFR calc Af Amer 72 (*) >90 mL/min   Comment:            The eGFR has been calculated     using the CKD EPI equation.     This calculation has not been     validated in all clinical     situations.     eGFR's persistently     <90 mL/min signify     possible Chronic Kidney Disease.  GLUCOSE, CAPILLARY     Status: Abnormal   Collection Time    02/18/13  7:40 AM      Result Value Range   Glucose-Capillary 157 (*) 70 - 99 mg/dL      RADIOGRAPHY: Dg Chest 2 View  02/17/2013  *RADIOLOGY REPORT*  Clinical Data: Short of breath  CHEST - 2 VIEW   Comparison: Prior chest x-ray 01/18/2013  Findings: Slightly increased pulmonary edema compared to prior. The degree of enlargement the cardiopericardial silhouette is stable.  Bibasilar opacities are noted.  There are bilateral layering pleural effusions.  No acute osseous abnormality.  IMPRESSION: Cardiomegaly, mild interstitial pulmonary edema and small bilateral effusions most consistent with underlying CHF.  Bibasilar opacities favored to reflect pleural fluid with atelectasis.  Superimposed infiltrate is difficult to exclude radiographically.   Original Report Authenticated By: Malachy Moan, M.D.        ASSESSMENT:  1. Thrombocytopenia.  Normal platelet count on 01/18/2013 at 194,000.  Platelet count of 23,000 at presentation.  On Dexamethasone 20 mg BID x 4 days.  2. Iron deficiency with TIBC of 377, low serum iron at 37 and low % sat at 10.  Ferritin is likely falsely elevated with acute CHF. 3. Borderline low B12.  Will hold of on B12 replacement at this time.  4. Acute on chronic CHF, failure to follow-up with cardiology as an outpatient and failure to follow-up on medication refills 5. Cellulitis of B/L LE 6. B/L LE edema 7. B/L LE petechiae 8. B/L LE excoriations, self-inflicted although patient denies. 9. Diffuse erythematous rash, unknown etiology at this time 10.Atrial fibrillation with RVR. 11. Schizoaffective disorder   PLAN:  1. Will order methylmalonic acid 2. Will order Feraheme 1020 mg IV today 3. [place] to evaluate for splenomegaly 4. Continue Dexamethasone 20 mg BID x 4 days (pulse) 5. Would not recommend platelet transfusion at this time. 6.  Transfuse platelets for as platelet count below 10,000 and/or active bleeding.  7. Will continue to follow as an inpatient.   All questions were answered. The patient knows to call the clinic with any problems, questions or concerns. We can certainly see the patient much sooner if necessary.  The patient and plan discussed with  Glenford Peers, MD and he is in agreement with the aforementioned.  KEFALAS,THOMAS   It is not clear why he has low platelets presently, but will treat as if he has ITP and continue work-up.

## 2013-02-18 NOTE — Plan of Care (Signed)
Problem: Food- and Nutrition-Related Knowledge Deficit (NB-1.1) Goal: Nutrition education Formal process to instruct or train a patient/client in a skill or to impart knowledge to help patients/clients voluntarily manage or modify food choices and eating behavior to maintain or improve health. Outcome: Completed/Met Date Met:  02/18/13 Pt admitted with gradual onset and worsening, constant SOB and abdominal pain. Pt with hx of A-fib, CHF, HTN, and schizoaffective disorder. Pt with hx of chronic non-compliance with medications, per physician note. Per NP note, pt with 20 pound weight gain x1 month, likely related to fluid given hx and bilateral lower extremity edema.  Spoke with pt about a low-sodium diet and provided him with Low-Sodium Diet handout from the Academy of [employer] Dietetics web site. Pt with good understanding of serving size importance and food label reading. Pt is able to report examples of high-sodium foods and reports that he does add salt when cooking, advised pt on better seasoning alternatives. Pt very interested in fat, especially saturated fat, and states that he often eats an entire bag of chips or Doritos and a gallon of milk in one sitting. Spoke about serving size importance and pt willing to switch to 2% milk as he has liked it while he has been here.   Based on discussion and previous hx of non-compliance, expect poor to fair compliance.

## 2013-02-18 NOTE — Plan of Care (Signed)
Problem: Consults Goal: Heart Failure Patient Education (See Patient Education module for education specifics.)  Outcome: Progressing Pt given Heart failure & BNP  Educational information sheets. Briefly discussed this information due to pt needing to sleep & rest. Needs reinforcement on material r/t pt compliance.  Problem: Phase I Progression Outcomes Goal: Dyspnea controlled at rest (HF) Outcome: Progressing Pt sats 96% at rest on 4liters [place] with no SOB Goal: Voiding-avoid urinary catheter unless indicated Outcome: Progressing Pt is voiding per urinnal clear yellow urine Goal: Hemodynamically stable Outcome: Progressing Vital signs are stable HR afib 84  Resp 21 sat 97% Bp 142/81

## 2013-02-18 NOTE — Progress Notes (Signed)
[name] PROGRESS NOTE  MERRIK PUEBLA GNF:[id number] DOB: 06/20/1951 DOA: 02/17/2013 PCP: No PCP Per Patient  Assessment/Plan: Acute on chronic systolic HF (heart failure):due to non-compliance with meds. Response to IV lasix minimal. Will increase dose. Volume status -748cc. Wt. Unchanged since admission. Of note, pt weight 01/17/13 was 298lbs. On this admission weight up to 318lbs for a gain of 20lbs in 1 month. In addition, weight 8/13 was 280lbs so pt has gained 38lbs in 1 year.  Will maintain strict intake and output. Continue to weigh daily. Last echo 9/13 with EF 45-50% and severe hypokinesis. Request education and continue meds.  May be candidate for outpatient case manager.   Active Problems  Atrial fibrillation with rapid ventricular response: due to not taking meds. PO meds started yesterday. Rate controlled. Will discontinue drip. If HR remains controlled, will transfer to floor.   HTN: much improved. Will continue meds and monitor.    Thrombocytopenia, new. Trending up slightly.  No obvious bleeding noted. The rest of his CBC is unremarkable. CT of 1/14 reviewed with Dr. Pia Mau. No evidence of cirrhosis or portal hypertension. speen only mildly enlarged. Concerned about ITP. Discussed with Dr. Laurie Panda. We will give pulse dose Decadron per his recommendations and he will consult. Sed rate 30, ANA in process, Rheumatoid factor,HIV antibody, B12 folate, ferritin within normal limits.    Lower extremity edema and erythema: hx of edema. Mainly petechiae, but also erythematous areas on the feet may be cellulitis. Much improved today. less swelling and erythema.  Will continue to elevate, given doxycycline day #2.    Obesity: nutritional consult :BMI 39.8  Schizoaffective disorder appears at baseline. Not on meds currently. Patient has been chronically noncompliant with medications. [facility] consult was obtained previously which was not terribly helpful with respect to  medication management.   Noncompliance: hx of same. Will request SW and case management for evaluation of ability to get meds and get to appointment.    Code Status: full Family Communication: pt at bedside Disposition Plan: home when ready maybe tomorrow   Consultants:  Heme Dr Mariel Sleet  Procedures:  none  Antibiotics:  Doxycycline 02/17/13>>  HPI/Subjective: Sitting up in bed, reports "feeling better"  Objective: Filed Vitals:   02/18/13 0513 02/18/13 0600 02/18/13 0802 02/18/13 0819  BP:  134/73 132/96   Pulse:  77 78   Temp: 97.7 F (36.5 C)     TempSrc: Axillary     Resp:  18    Height:      Weight:  144.1 kg (317 lb 10.9 oz)    SpO2:  96%  95%    Intake/Output Summary (Last 24 hours) at 02/18/13 0911 Last data filed at 02/18/13 0600  Gross per 24 hour  Intake 151.17 ml  Output    900 ml  Net -748.83 ml   Filed Weights   02/17/13 2100 02/18/13 0600  Weight: 144.1 kg (317 lb 10.9 oz) 144.1 kg (317 lb 10.9 oz)    Exam:   General:  Obese alert NAD  Cardiovascular: irregulary irregular, No MGR 1+ LE edema bilaterally. Bilateral erythema improved  Respiratory: normal effort with conversation. Good air movement. No rhonchi no wheeze. No use accessory muscles  Abdomen: obese, soft +BS non-tender  Musculoskeletal: no clubbing no cyanosis   Data Reviewed: Basic Metabolic Panel:  Recent Labs Lab 02/17/13 0945 02/18/13 0451  NA 142 139  K 4.0 4.0  CL 104 103  CO2 29 27  GLUCOSE 79 184*  BUN 18  22  CREATININE 1.42* 1.23  CALCIUM 9.1 9.0   Liver Function Tests:  Recent Labs Lab 02/17/13 0945  AST 21  ALT 29  ALKPHOS 61  BILITOT 0.9  PROT 6.9  ALBUMIN 3.7    Recent Labs Lab 02/17/13 0945  LIPASE 61*   No results found for this basename: AMMONIA,  in the last 168 hours CBC:  Recent Labs Lab 02/17/13 0945 02/17/13 1425 02/18/13 0451  WBC 8.4  --  4.2  NEUTROABS 6.2  --   --   HGB 13.1  --  12.3*  HCT 39.6  --  36.7*   MCV 82.8  --  82.3  PLT 23* 24* 32*   Cardiac Enzymes:  Recent Labs Lab 02/17/13 0945  TROPONINI <0.30   BNP (last 3 results)  Recent Labs  01/16/13 0758 01/19/13 0500 02/17/13 0945  PROBNP 815.6* 242.1* 1249.0*   CBG: No results found for this basename: GLUCAP,  in the last 168 hours  Recent Results (from the past 240 hour(s))  MRSA PCR SCREENING     Status: None   Collection Time    02/17/13  7:35 PM      Result Value Range Status   MRSA by PCR NEGATIVE  NEGATIVE Final   Comment:            The GeneXpert MRSA Assay (FDA     approved for NASAL specimens     only), is one component of a     comprehensive MRSA colonization     surveillance program. It is not     intended to diagnose MRSA     infection nor to guide or     monitor treatment for     MRSA infections.     Studies: Dg Chest 2 View  02/17/2013  *RADIOLOGY REPORT*  Clinical Data: Short of breath  CHEST - 2 VIEW  Comparison: Prior chest x-ray 01/18/2013  Findings: Slightly increased pulmonary edema compared to prior. The degree of enlargement the cardiopericardial silhouette is stable.  Bibasilar opacities are noted.  There are bilateral layering pleural effusions.  No acute osseous abnormality.  IMPRESSION: Cardiomegaly, mild interstitial pulmonary edema and small bilateral effusions most consistent with underlying CHF.  Bibasilar opacities favored to reflect pleural fluid with atelectasis.  Superimposed infiltrate is difficult to exclude radiographically.   Original Report Authenticated By: Malachy Moan, M.D.     Scheduled Meds: . carvedilol  12.5 mg Oral BID WC  . dexamethasone  20 mg Oral BID  . diltiazem  240 mg Oral QHS  . doxycycline  100 mg Oral Q12H  . furosemide  80 mg Intravenous BID  . isosorbide mononitrate  30 mg Oral Daily  . lisinopril  10 mg Oral Daily  . potassium chloride SA  40 mEq Oral Daily  . sodium chloride  3 mL Intravenous Q12H   Continuous Infusions:   Principal  Problem:   Acute on chronic systolic HF (heart failure) Active Problems:   Obesity   Schizoaffective disorder   Noncompliance   Hypertension   Atrial fibrillation with rapid ventricular response   Dyspnea   Lower extremity edema    Time spent: 30 minutes    [facility] M  [name]  If 7PM-7AM, please contact night-coverage at [email or web address], password [facility] 02/18/2013, 9:11 AM  LOS: 1 day

## 2013-02-19 LAB — BASIC METABOLIC PANEL
Calcium: 9.5 mg/dL (ref 8.4–10.5)
GFR calc Af Amer: 57 mL/min — ABNORMAL LOW (ref >90)
GFR calc non Af Amer: 49 mL/min — ABNORMAL LOW (ref >90)
Glucose, Bld: 161 mg/dL — ABNORMAL HIGH (ref 70–99)
Potassium: 4.1 mEq/L (ref 3.5–5.1)
Sodium: 138 mEq/L (ref 135–145)

## 2013-02-19 LAB — GLUCOSE, CAPILLARY: Glucose-Capillary: 160 mg/dL — ABNORMAL HIGH (ref 70–99)

## 2013-02-19 LAB — CBC
Hemoglobin: 12.8 g/dL — ABNORMAL LOW (ref 13.0–17.0)
MCHC: 33.5 g/dL (ref 30.0–36.0)
Platelets: 99 10*3/uL — ABNORMAL LOW (ref 150–400)
RDW: 16.3 % — ABNORMAL HIGH (ref 11.5–15.5)

## 2013-02-19 MED ORDER — FUROSEMIDE 10 MG/ML IJ SOLN
120.0000 mg | Freq: Two times a day (BID) | INTRAVENOUS | Status: DC
  Filled 2013-02-19: qty 12

## 2013-02-19 MED ORDER — CARVEDILOL 12.5 MG PO TABS
25.0000 mg | ORAL_TABLET | Freq: Two times a day (BID) | ORAL | Status: DC
  Administered 2013-02-20: 25 mg via ORAL
  Filled 2013-02-19: qty 2

## 2013-02-19 MED ORDER — DILTIAZEM HCL ER COATED BEADS 180 MG PO CP24
300.0000 mg | ORAL_CAPSULE | Freq: Every day | ORAL | Status: DC
  Administered 2013-02-19: 300 mg via ORAL
  Filled 2013-02-19: qty 1

## 2013-02-19 MED ORDER — METOLAZONE 5 MG PO TABS: 5.0000 mg | ORAL_TABLET | Freq: Every day | ORAL | Status: DC

## 2013-02-19 MED ORDER — FUROSEMIDE 10 MG/ML IJ SOLN
INTRAMUSCULAR | Status: AC
  Filled 2013-02-19: qty 20

## 2013-02-19 MED ORDER — DEXAMETHASONE 6 MG PO TABS
20.0000 mg | ORAL_TABLET | Freq: Two times a day (BID) | ORAL | Status: DC
  Administered 2013-02-19 (×2): 20 mg via ORAL
  Filled 2013-02-19: qty 1
  Filled 2013-02-19: qty 5
  Filled 2013-02-19: qty 1

## 2013-02-19 NOTE — Progress Notes (Signed)
[name] PROGRESS NOTE  Todd Page ZOX:[id number] DOB: 08-21-1951 DOA: 02/17/2013 PCP: No PCP Per Patient  Assessment/Plan: Acute on chronic systolic HF (heart failure):due to non-compliance with meds. Volume status +1L. Wt. 143.3kg down from 144.1kg. Of note, pt weight 01/17/13 was 298lbs. On this admission weight up to 318lbs for a gain of 20lbs in 1 month. In addition, weight 8/13 was 280lbs so pt has gained 38lbs in 1 year.  Last echo 9/13 with EF 45-50% and severe hypokinesis. Lasix increased yesterday. Looks clinically improved.   Active Problems  Atrial fibrillation with rapid ventricular response: due to not taking meds. Rate controlled on po cardizem. Will transfer to floor.   Acute renal failure: likely related to increased lasix needed to diurese for #1. Will monitor closely as need to continue lasix.   HTN:  Only fair control.  Will continue meds and monitor.Currently taking lisinopril, imdur, lasix, cardizem and coreg. May need to consider adjusting.   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 Continues to trend up.  No obvious bleeding noted. The rest of his CBC is unremarkable. CT of 1/14 reviewed with Dr. Pia Mau. No evidence of cirrhosis or portal hypertension. speen only mildly enlarged. Ultra sound of abdomen yielsd cholelithiasis without evidence of acute cholecystitis.  Echogenic liver which can be seen with fatty infiltration of  cirrhosis, with no definite hepatic nodularity or mass identified. Tiny bilateral renal cysts. Incomplete visualization of pancreatic tail. Normal  Appearance to spleen. Continue dexamethasone per heme. Last dose 02/20/13.    Lower extremity edema and erythema: hx of edema. Mainly petechiae, but also erythematous areas on the feet may be cellulitis. Continues to improve. Continue  doxycycline day #3.   Obesity: nutritional consult :BMI 39.8   Schizoaffective disorder appears at baseline. Not on meds currently. Patient has been chronically  noncompliant with medications. tele psychiatry consult was obtained previously which was not terribly helpful with respect to medication management.   Noncompliance: hx of same. Will request SW and case management for evaluation of ability to get meds and get to appointment.   Code Status: full Family Communication: pt at bedside Disposition Plan: home hopefully tomorrow   Consultants:  Heme Dr Mariel Sleet  Procedures:  none  Antibiotics:  Doxycycline 02/17/13>>>  HPI/Subjective: Sitting in chair eating. States he feels better  Objective: Filed Vitals:   02/19/13 0500 02/19/13 0600 02/19/13 0758 02/19/13 0800  BP: 115/93 159/101 167/102   Pulse:   97 91  Temp:    97.7 F (36.5 C)  TempSrc:    Oral  Resp:      Height:      Weight:      SpO2:    97%    Intake/Output Summary (Last 24 hours) at 02/19/13 1247 Last data filed at 02/19/13 1100  Gross per 24 hour  Intake   1783 ml  Output    950 ml  Net    833 ml   Filed Weights   02/17/13 2100 02/18/13 0600  Weight: 144.1 kg (317 lb 10.9 oz) 144.1 kg (317 lb 10.9 oz)    Exam:   General:  Obese alert NAD  Cardiovascular: irregularly irregular. No MGR trace-1+ LE edema. Bilateral LE erythema  Respiratory: normal effort BS with fair air movement. No rhonchi no crackles  Abdomen: obese soft +BS Non tender to palpation  Musculoskeletal: no clubbing no cyanosis. LE with improving erythema and petechial rash. Scratches particularly on left LE   Data Reviewed: Basic Metabolic Panel:  Recent Labs Lab  02/17/13 0945 02/18/13 0451 02/19/13 0439  NA 142 139 138  K 4.0 4.0 4.1  CL 104 103 99  CO2 29 27 26   GLUCOSE 79 184* 161*  BUN 18 22 38*  CREATININE 1.42* 1.23 1.49*  CALCIUM 9.1 9.0 9.5   Liver Function Tests:  Recent Labs Lab 02/17/13 0945  AST 21  ALT 29  ALKPHOS 61  BILITOT 0.9  PROT 6.9  ALBUMIN 3.7    Recent Labs Lab 02/17/13 0945  LIPASE 61*   No results found for this basename:  AMMONIA,  in the last 168 hours CBC:  Recent Labs Lab 02/17/13 0945 02/17/13 1425 02/18/13 0451 02/19/13 0439  WBC 8.4  --  4.2 11.7*  NEUTROABS 6.2  --   --   --   HGB 13.1  --  12.3* 12.8*  HCT 39.6  --  36.7* 38.2*  MCV 82.8  --  82.3 82.3  PLT 23* 24* 32* 99*   Cardiac Enzymes:  Recent Labs Lab 02/17/13 0945  TROPONINI <0.30   BNP (last 3 results)  Recent Labs  01/16/13 0758 01/19/13 0500 02/17/13 0945  PROBNP 815.6* 242.1* 1249.0*   CBG:  Recent Labs Lab 02/18/13 0740 02/18/13 1832 02/19/13 0723  GLUCAP 157* 182* 160*    Recent Results (from the past 240 hour(s))  MRSA PCR SCREENING     Status: None   Collection Time    02/17/13  7:35 PM      Result Value Range Status   MRSA by PCR NEGATIVE  NEGATIVE Final   Comment:            The GeneXpert MRSA Assay (FDA     approved for NASAL specimens     only), is one component of a     comprehensive MRSA colonization     surveillance program. It is not     intended to diagnose MRSA     infection nor to guide or     monitor treatment for     MRSA infections.     Studies: US Abdomen Complete  02/19/2013  *RADIOLOGY REPORT*  Clinical Data:  Thrombocytopenia question splenomegaly or cirrhosis, history hypertension, gout  ULTRASOUND ABDOMEN:  Technique:  Sonography of upper abdominal structures was performed. Examination limited by body habitus and patient breathing.  Comparison:  None Correlation:  CT abdomen 11/10/2012  Gallbladder:  Multiple shadowing calculi within gallbladder.  No gallbladder wall thickening, pericholecystic fluid or sonographic Murphy's sign.  Common bile duct:  Upper normal caliber 6 mm diameter  Liver:  Heterogeneous increased echogenicity question fatty infiltration though this can be seen with cirrhosis and certain infiltrative disorders.  No definite focal hepatic mass or nodularity.  Hepatopetal portal venous flow.  IVC:  Normal appearance  Pancreas:  Tail obscured by bowel gas.   Visualized portions normal appearance.  Spleen:  Normal appearance, 9.7 cm length  Right kidney:  11.4 cm length.  Small cyst upper pole 1.3 x 1.3 x 1.3 cm.  No definite solid mass or hydronephrosis.  Tiny parenchymal echogenic foci without shadowing, nonspecific; no definite renal calcifications seen on recent CT.  Left kidney:  12.1 cm length.  Tiny cyst mid kidney 7 x 9 x 9 mm. No definite solid mass or hydronephrosis.  Tiny parenchymal echogenic foci without shadowing, nonspecific; no definite renal calcifications seen on recent CT.  Aorta:  Grossly normal caliber  Other:  No free fluid  IMPRESSION: Cholelithiasis without evidence of acute cholecystitis. Echogenic liver which can be  seen with fatty infiltration of cirrhosis, with no definite hepatic nodularity or mass identified. Tiny bilateral renal cysts. Incomplete visualization of pancreatic tail.   Original Report Authenticated By: Ulyses Southward, M.D.     Scheduled Meds: . carvedilol  12.5 mg Oral BID WC  . dexamethasone  20 mg Oral BID  . diltiazem  240 mg Oral QHS  . doxycycline  100 mg Oral Q12H  . furosemide  80 mg Intravenous BID  . isosorbide mononitrate  30 mg Oral Daily  . lisinopril  10 mg Oral Daily  . potassium chloride SA  40 mEq Oral Daily  . sodium chloride  3 mL Intravenous Q12H   Continuous Infusions:    Time spent: 30 minutes  [facility] M  [name]  If 7PM-7AM, please contact night-coverage at [email or web address], password [facility] 02/19/2013, 12:47 PM  LOS: 2 days   Attending note  Patient interviewed and examined. He feels better. Blood pressure still high. Not diuresis in well. Weight not changed much. May be in part due to high-dose steroids. Will add zaroxolyn. Change Lasix 120 mg twice daily IV. Monitor renal function. Cellulitis resolved. Will stop doxycycline. Patient appears to be drinking quite a bit despite being on fluid restriction. He reports that he drinks a gallon of milk at a time sometimes, and an  entire pitcher of iced tea. He spoke with dietitian today. He will need a primary care provider. Recommend that care management schedule the appointment. Again, we discussed the importance of followup visits and medication compliance. He would benefit from an outpatient case manager of some sort to assist with his chronic noncompliance, no shows, dietary indiscretion, et Karie Soda. Patient reportedly has schizoaffective disorder. He has suffered from delusions in the past, but these seem better during this hospitalization. I suspect this is the main barrier to his compliance issues.  Platelet count responding to Decadron. Ultrasound of the abdomen shows no splenomegaly, or definite cirrhosis. Just hyperechoic liver, which could be fatty liver disease. No portal hypertension noted.  Crista Curb, M.D.

## 2013-02-19 NOTE — Clinical Documentation Improvement (Signed)
RESPIRATORY FAILURE DOCUMENTATION CLARIFICATION QUERY   THIS DOCUMENT IS NOT A PERMANENT PART OF THE MEDICAL RECORD  TO RESPOND TO THE THIS QUERY, FOLLOW THE INSTRUCTIONS BELOW:  1. If needed, update documentation for the patient's encounter via the notes activity.  2. Access this query again and click edit on the In [employer].  3. After updating, or not, click F2 to complete all highlighted (required) fields concerning your review. Select "additional documentation in the medical record" OR "no additional documentation provided".  4. Click Sign note button.  5. The deficiency will fall out of your In Basket *Please let us know if you are not able to complete this workflow by phone or e-mail (listed below).  Please update your documentation within the medical record to reflect your response to this query.                                                                                    02/19/13  Dear Todd Page,  In a better effort to capture your patient's severity of illness, reflect appropriate length of stay and utilization of resources, a review of the patient medical record has revealed the following indicators.    Based on your clinical judgment, please clarify and document in a progress note and/or discharge summary the clinical condition associated with the following supporting information:  In responding to this query please exercise your independent judgment.  The fact that a query is asked, does not imply that any particular answer is desired or expected.  Please clarify respiratory status. Thank you  Possible Clinical Conditions?  Acute Respiratory Failure Acute on Chronic Respiratory Failure Chronic Respiratory Failure Other Condition________________ Cannot Clinically Determine   Supporting Information:  Risk Factors: Admitted with Acute on Chronic Systolic Heart Failure Respiratory distress Pulmonary edema  Signs&Symptoms: Shortness of  breath Rales Wheezing O2 sats 88-99 on O2 3-4L/mins via [place]  Diagnostics: ABG's     PO2  69.1     PCO2  44.7     PH  7.401     BiCarb  27.2     Date of ABG's  02/17/13     O2 concentration: 3 L     O2 mode:   Radiology: IMPRESSION:  Cardiomegaly, mild interstitial pulmonary edema and small bilateral  effusions most consistent with underlying CHF.  Bibasilar opacities favored to reflect pleural fluid with  atelectasis. Superimposed infiltrate is difficult to exclude  radiographically.   Treatment: O2 therapy to keep O2 sats >92% = 3-4L Proventil 2.5mg  nebs q2h prn Administered 4/22@2310  Decadron 20mg  po bid  You may use possible, probable, or suspect with inpatient documentation. possible, probable, suspected diagnoses MUST be documented at the time of discharge  Reviewed:  Thank You,  Harless Litten RN, MSN Clinical Documentation Specialist: Office# [phone] [facility] Information [facility] [place]

## 2013-02-19 NOTE — Progress Notes (Signed)
Patient ID: Todd Page, male   DOB: 07-04-1951, 62 y.o.   MRN: [id number] No show

## 2013-02-19 NOTE — Progress Notes (Signed)
Subjective: Patient seen sitting in recliner eating breakfast.  He was witnessed scratching his leg lesions with an open lesion actively leaking serosanguinous fluid.  He denies any complaints this AM.  He is breathing much better with his Nasal Cannula not in place.   He did receive his Feraheme 1020 mg IV.  Objective: Vital signs in last 24 hours: Temp:  [97.3 F (36.3 C)-97.8 F (36.6 C)] 97.6 F (36.4 C) (04/24 0400) Pulse Rate:  [71-118] 97 (04/24 0758) Resp:  [19-35] 22 (04/24 0400) BP: (91-167)/(66-141) 167/102 mmHg (04/24 0758) SpO2:  [91 %-97 %] 97 % (04/24 0200)  Intake/Output from previous day: 04/23 0800 - 04/24 0759 In: 2273 [P.O.:2160; I.V.:13; IV Piggyback:100] Out: 950 [Urine:950] Intake/Output this shift:    General appearance: alert, cooperative, appears older than stated age and morbidly obese Resp: rales RLL and wheezes RLL Cardio: irregularly irregular rhythm GI: abnormal findings:  obese Extremities: B/L Lower leg cellulitis with B/L LE edema and petechial rash with complications of self-inflicted excoriations Skin: erythematous lesions measuring less than 5 mm, more on superior portion of back, chest L>R, abdomen, and LE    Lab Results:   Recent Labs  02/18/13 0451 02/19/13 0439  WBC 4.2 11.7*  HGB 12.3* 12.8*  HCT 36.7* 38.2*  PLT 32* 99*   BMET  Recent Labs  02/18/13 0451 02/19/13 0439  NA 139 138  K 4.0 4.1  CL 103 99  CO2 27 26  GLUCOSE 184* 161*  BUN 22 38*  CREATININE 1.23 1.49*  CALCIUM 9.0 9.5    Studies/Results: Dg Chest 2 View  02/17/2013  *RADIOLOGY REPORT*  Clinical Data: Short of breath  CHEST - 2 VIEW  Comparison: Prior chest x-ray 01/18/2013  Findings: Slightly increased pulmonary edema compared to prior. The degree of enlargement the cardiopericardial silhouette is stable.  Bibasilar opacities are noted.  There are bilateral layering pleural effusions.  No acute osseous abnormality.  IMPRESSION: Cardiomegaly, mild  interstitial pulmonary edema and small bilateral effusions most consistent with underlying CHF.  Bibasilar opacities favored to reflect pleural fluid with atelectasis.  Superimposed infiltrate is difficult to exclude radiographically.   Original Report Authenticated By: Malachy Moan, M.D.     Medications: I have reviewed the patient's current medications.  Assessment/Plan: 1. Thrombocytopenia. Normal platelet count on 01/18/2013 at 194,000. Platelet count of 23,000 at presentation. On Dexamethasone 20 mg BID x 4 days.  Started on 4/22 and this will end on 4/25.  Platelet count 99,000 this AM.  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 2. Iron deficiency with TIBC of 377, low serum iron at 37 and low % sat at 10. Ferritin is likely falsely elevated with acute CHF.  S/P Feraheme 1020 mg IV on 02/18/2013. 3. Borderline low B12. Will hold of on B12 replacement at this time.  4. Acute on chronic CHF, failure to follow-up with cardiology as an outpatient and failure to follow-up on medication refills  5. Cellulitis of B/L LE, on antibiotics doxycycline 6. B/L LE edema  7. B/L LE petechiae  8. B/L LE excoriations, self-inflicted although patient denies.  9. Diffuse erythematous rash, unknown etiology at this time  10. Atrial fibrillation with RVR.  11. Schizoaffective disorder  Patient and plan will be discussed with Dr. Mariel Sleet within the next 24 hours.      LOS: 2 days    KEFALAS,THOMAS 02/19/2013

## 2013-02-20 DIAGNOSIS — J9600 Acute respiratory failure, unspecified whether with hypoxia or hypercapnia: Secondary | ICD-10-CM | POA: Diagnosis present

## 2013-02-20 DIAGNOSIS — E669 Obesity, unspecified: Secondary | ICD-10-CM

## 2013-02-20 LAB — BASIC METABOLIC PANEL
Chloride: 98 mEq/L (ref 96–112)
GFR calc Af Amer: 58 mL/min — ABNORMAL LOW (ref >90)
GFR calc non Af Amer: 50 mL/min — ABNORMAL LOW (ref >90)
Potassium: 3.7 mEq/L (ref 3.5–5.1)
Sodium: 138 mEq/L (ref 135–145)

## 2013-02-20 LAB — CBC
MCHC: 33 g/dL (ref 30.0–36.0)
Platelets: 147 10*3/uL — ABNORMAL LOW (ref 150–400)
RDW: 16.7 % — ABNORMAL HIGH (ref 11.5–15.5)
WBC: 10.4 10*3/uL (ref 4.0–10.5)

## 2013-02-20 LAB — GLUCOSE, CAPILLARY
Glucose-Capillary: 147 mg/dL — ABNORMAL HIGH (ref 70–99)
Glucose-Capillary: 136 mg/dL — ABNORMAL HIGH (ref 70–99)

## 2013-02-20 MED ORDER — LISINOPRIL 10 MG PO TABS: 10.0000 mg | ORAL_TABLET | Freq: Every day | ORAL | Status: DC

## 2013-02-20 MED ORDER — DEXAMETHASONE 4 MG PO TABS
20.0000 mg | ORAL_TABLET | Freq: Two times a day (BID) | ORAL | Status: DC
  Administered 2013-02-20: 20 mg via ORAL
  Filled 2013-02-20 (×2): qty 5

## 2013-02-20 MED ORDER — DEXAMETHASONE 4 MG PO TABS: 20.0000 mg | ORAL_TABLET | Freq: Two times a day (BID) | ORAL | Status: DC

## 2013-02-20 NOTE — Care Management Note (Signed)
    Page 1 of 1   02/20/2013     12:20:28 PM   CARE MANAGEMENT NOTE 02/20/2013  Patient:  Todd Page, Todd Page   Account Number:  [id number]  Date Initiated:  02/20/2013  Documentation initiated by:  Rosemary Holms  Subjective/Objective Assessment:   Pt admitted from home where he lives with his mother. Previously had HH with [facility]. needs PCP.     Action/Plan:   Anticipated DC Date:  02/20/2013   Anticipated DC Plan:  HOME/SELF CARE      DC Planning Services  CM consult      Choice offered to / List presented to:          [facility] arranged  HH-1 RN  HH-10 DISEASE MANAGEMENT  HH-2 PT      HH agency  [facility]   Status of service:  Completed, signed off Medicare Important Message given?  YES (If response is "NO", the following Medicare IM given date fields will be blank) Date Medicare IM given:  02/20/2013 Date Additional Medicare IM given:    Discharge Disposition:  HOME W HOME HEALTH SERVICES  Per UR Regulation:    If discussed at Long Length of Stay Meetings, dates discussed:    Comments:  02/20/13 Rosemary Holms RN BSN CM New pt appt set up with [facility] for Monday 28th. Pt aware and states he will go to this appt.

## 2013-02-20 NOTE — Discharge Summary (Signed)
Physician Discharge Summary  SHAHEER BONFIELD MWU:[id number] DOB: 1951-10-13 DOA: 02/17/2013  PCP: No PCP Per Patient  Admit date: 02/17/2013 Discharge date: 02/20/2013  Time spent: 4o minutes  Recommendations for Outpatient Follow-up:  1. Will need to call oncology clinic at [facility] on 02/23/13 for a follow up appointment.  2. Pt has appointment on 02/23/13 at [facility]  Discharge Diagnoses:  Principal Problem:   Acute on chronic systolic HF (heart failure) Active Problems:   Obesity   Schizoaffective disorder   Noncompliance   Hypertension   Atrial fibrillation with rapid ventricular response   Dyspnea   Lower extremity edema   Discharge Condition: stable  Diet recommendation: heart healthy  Filed Weights   02/17/13 2100 02/18/13 0600 02/19/13 1249  Weight: 144.1 kg (317 lb 10.9 oz) 144.1 kg (317 lb 10.9 oz) 143.337 kg (316 lb)    History of present illness:  BECKHAM CAPISTRAN is a 62 y.o. male pmhx CHF, afib, HTN, non-compliance, schizoaffective disorder, obesity who presented to ED on 02/17/13 with cc shortness of breath. Information obtained from pt. Stated worsening sob started the am of 02/17/13. He called EMS who came and "checked me out" and left. He stated he developed bilateral side pain. Denied CP, palpitation. Denied cough. Denied abdominal swelling but reports worsening bilateral lower extremity edema with erythema. Stated he props up on 2 pillows to sleep. Denied fever, chills nausea/vomiting or diarrhea. Reported he has not taken medication in "several days" as he does not have them. He also reported that he not sure why he has not shown up for his follow up apt. With cardiology. In ED pt found to be in afib with RVR, chest xray consistent with CHF. [facility] asked to admit. Platelet count is 20K, which is a new problem. Chronic noncompliance.   Hospital Course:  Acute on chronic systolic HF (heart failure):due to non-compliance with meds. Pt admitted and given lasix. Diuresis unimpressive  which may be related to high dose steroids given.  Volume status +1L. Wt. 140.0kg down from 144.1kg on admission. However, pt improved clinically. On day of discharge LE edema much improved, BS clear with no resp distress.  Of note, pt weight 01/17/13 was 298lbs. On this admission weight up to 318lbs for a gain of 20lbs in 1 month. In addition, weight 8/13 was 280lbs so pt has gained 38lbs in 1 year. Last echo 9/13 with EF 45-50% and severe hypokinesis.  Active Problems   Atrial fibrillation with rapid ventricular response: due to not taking meds. Rate controlled on po cardizem. Pt educated/counseled to the importance of complying with medications and follow up appointments. In view of his noncompliance, chronic anticoagulation would not be appropriate.  Acute respiratory failure: related to #1 and #2. Improved with above therapies. At discharge respiratory effort normal. No crackles on exam. Maintaining sats on room air at discharge   Acute renal failure: likely related to increased lasix needed to diurese for #1. At discharge will resume home dose of lasix as creatinine trending upward slightly. Urine output good. Recommend bmet in 1 week to track.    HTN: Poor control on admission. At discharge fair control. Ultimately resumed home meds lisinopril, imdur, lasix, cardizem and coreg.    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 Seen by Dr. Mariel Sleet with hematology. Treated with 4 days of dexamethasone. Platelets continue to trend up. No obvious bleeding noted. The rest of his CBC is unremarkable. CT of 1/14 reviewed with Dr. Pia Mau. No evidence of cirrhosis or portal hypertension.  speen only mildly enlarged. Ultra sound of abdomen yielsd cholelithiasis without evidence of acute cholecystitis. Echogenic liver which can be seen with fatty infiltration of cirrhosis, with no definite hepatic nodularity or mass identified. Tiny bilateral renal cysts. Incomplete visualization of pancreatic tail. Normal Appearance to  spleen. Pt to call hematology clinic on 02/23/13 for follow up appointment and follow up cbc.    Lower extremity edema and erythema/ cellulitis : hx of edema. Mainly petechiae, but also erythematous areas on the feet may be cellulitis. Pt started on doxycyline empirically. Discontinued after day 3 as resolved by discharge.   Obesity: nutritional consult :BMI 39.8  Schizoaffective disorder appears at baseline. Not on meds currently. Patient has been chronically noncompliant with medications. tele psychiatry consult was obtained previously which was not terribly helpful with respect to medication management. Pt has suffered from delusions in past but has had no issues during this hospitalization.   Noncompliance: hx of same. Will request SW and case management for evaluation of ability to get meds and get to appointment.   Procedures:  none  Consultations:  Dr Mariel Sleet hematology  Discharge Exam: Filed Vitals:   02/19/13 1700 02/19/13 1800 02/20/13 0454 02/20/13 0706  BP: 156/111 142/86 125/78   Pulse:   71   Temp:   98 F (36.7 C)   TempSrc:   Oral   Resp: 23 23 20    Height:      Weight:      SpO2:   95% 95%    General: obese NAD Cardiovascular: irregularly irregular no MGR trace LE edema. Bilateral LE erythema much improved Respiratory: normal effort BS clear bilaterally no wheeze no crackles Abdomen: obese soft +BS non-tender to palpation  Discharge Instructions     Medication List    TAKE these medications       ALPRAZolam 0.25 MG tablet  Commonly known as:  XANAX  Take 0.25 mg by mouth 3 (three) times daily as needed for sleep.     aspirin 81 MG tablet  Take 1 tablet (81 mg total) by mouth daily.     carvedilol 12.5 MG tablet  Commonly known as:  COREG  Take 1 tablet (12.5 mg total) by mouth 2 (two) times daily with a meal.     dexamethasone 4 MG tablet  Commonly known as:  DECADRON  Take 5 tablets (20 mg total) by mouth 2 (two) times daily.     diltiazem  240 MG 24 hr tablet  Commonly known as:  CARDIZEM LA  Take 1 tablet (240 mg total) by mouth at bedtime. For treatment of your elevated heart rate and high blood pressure.     furosemide 20 MG tablet  Commonly known as:  LASIX  Take 2 tablets (40 mg total) by mouth daily.     ibuprofen 200 MG tablet  Commonly known as:  ADVIL,MOTRIN  Take 400 mg by mouth every 6 (six) hours as needed for pain.     isosorbide mononitrate 30 MG 24 hr tablet  Commonly known as:  IMDUR  Take 1 tablet (30 mg total) by mouth daily.     lisinopril 10 MG tablet  Commonly known as:  PRINIVIL,ZESTRIL  Take 1 tablet (10 mg total) by mouth daily. For treatment of your high blood pressure and your heart.     potassium chloride SA 20 MEQ tablet  Commonly known as:  K-DUR,KLOR-CON  Take 2 tablets (40 mEq total) by mouth daily.  Follow-up Information   Follow up with No PCP Per Patient.   Contact information:   [address] [place] [address] [phone]       Follow up with KEFALAS,THOMAS, PA-C. Call in 3 days. (call to make appointment for follow up. Office closed on day of discharge. )    Contact information:   [address] [place] [address] [phone]        The results of significant diagnostics from this hospitalization (including imaging, microbiology, ancillary and laboratory) are listed below for reference.    Significant Diagnostic Studies: Dg Chest 2 View  02/17/2013  *RADIOLOGY REPORT*  Clinical Data: Short of breath  CHEST - 2 VIEW  Comparison: Prior chest x-ray 01/18/2013  Findings: Slightly increased pulmonary edema compared to prior. The degree of enlargement the cardiopericardial silhouette is stable.  Bibasilar opacities are noted.  There are bilateral layering pleural effusions.  No acute osseous abnormality.  IMPRESSION: Cardiomegaly, mild interstitial pulmonary edema and small bilateral effusions most consistent with underlying CHF.  Bibasilar opacities  favored to reflect pleural fluid with atelectasis.  Superimposed infiltrate is difficult to exclude radiographically.   Original Report Authenticated By: Malachy Moan, M.D.    US Abdomen Complete  02/19/2013  *RADIOLOGY REPORT*  Clinical Data:  Thrombocytopenia question splenomegaly or cirrhosis, history hypertension, gout  ULTRASOUND ABDOMEN:  Technique:  Sonography of upper abdominal structures was performed. Examination limited by body habitus and patient breathing.  Comparison:  None Correlation:  CT abdomen 11/10/2012  Gallbladder:  Multiple shadowing calculi within gallbladder.  No gallbladder wall thickening, pericholecystic fluid or sonographic Murphy's sign.  Common bile duct:  Upper normal caliber 6 mm diameter  Liver:  Heterogeneous increased echogenicity question fatty infiltration though this can be seen with cirrhosis and certain infiltrative disorders.  No definite focal hepatic mass or nodularity.  Hepatopetal portal venous flow.  IVC:  Normal appearance  Pancreas:  Tail obscured by bowel gas.  Visualized portions normal appearance.  Spleen:  Normal appearance, 9.7 cm length  Right kidney:  11.4 cm length.  Small cyst upper pole 1.3 x 1.3 x 1.3 cm.  No definite solid mass or hydronephrosis.  Tiny parenchymal echogenic foci without shadowing, nonspecific; no definite renal calcifications seen on recent CT.  Left kidney:  12.1 cm length.  Tiny cyst mid kidney 7 x 9 x 9 mm. No definite solid mass or hydronephrosis.  Tiny parenchymal echogenic foci without shadowing, nonspecific; no definite renal calcifications seen on recent CT.  Aorta:  Grossly normal caliber  Other:  No free fluid  IMPRESSION: Cholelithiasis without evidence of acute cholecystitis. Echogenic liver which can be seen with fatty infiltration of cirrhosis, with no definite hepatic nodularity or mass identified. Tiny bilateral renal cysts. Incomplete visualization of pancreatic tail.   Original Report Authenticated By: Ulyses Southward,  M.D.     Microbiology: Recent Results (from the past 240 hour(s))  MRSA PCR SCREENING     Status: None   Collection Time    02/17/13  7:35 PM      Result Value Range Status   MRSA by PCR NEGATIVE  NEGATIVE Final   Comment:            The GeneXpert MRSA Assay (FDA     approved for NASAL specimens     only), is one component of a     comprehensive MRSA colonization     surveillance program. It is not     intended to diagnose MRSA  infection nor to guide or     monitor treatment for     MRSA infections.     Labs: Basic Metabolic Panel:  Recent Labs Lab 02/17/13 0945 02/18/13 0451 02/19/13 0439 02/20/13 0442  NA 142 139 138 138  K 4.0 4.0 4.1 3.7  CL 104 103 99 98  CO2 29 27 26 28   GLUCOSE 79 184* 161* 158*  BUN 18 22 38* 43*  CREATININE 1.42* 1.23 1.49* 1.47*  CALCIUM 9.1 9.0 9.5 9.1   Liver Function Tests:  Recent Labs Lab 02/17/13 0945  AST 21  ALT 29  ALKPHOS 61  BILITOT 0.9  PROT 6.9  ALBUMIN 3.7    Recent Labs Lab 02/17/13 0945  LIPASE 61*    CBC:  Recent Labs Lab 02/17/13 0945 02/17/13 1425 02/18/13 0451 02/19/13 0439 02/20/13 0442  WBC 8.4  --  4.2 11.7* 10.4  NEUTROABS 6.2  --   --   --   --   HGB 13.1  --  12.3* 12.8* 12.7*  HCT 39.6  --  36.7* 38.2* 38.5*  MCV 82.8  --  82.3 82.3 83.5  PLT 23* 24* 32* 99* 147*   Cardiac Enzymes:  Recent Labs Lab 02/17/13 0945  TROPONINI <0.30   BNP: BNP (last 3 results)  Recent Labs  01/16/13 0758 01/19/13 0500 02/17/13 0945  PROBNP 815.6* 242.1* 1249.0*   CBG:  Recent Labs Lab 02/18/13 0740 02/18/13 1832 02/19/13 0723  GLUCAP 157* 182* 160*       Signed:  BLACK,KAREN M  [name] 02/20/2013, 10:08 AM Attending: Patient seen and examined and the above-noted reviewed and modified. Patient is stable for discharge. The major challenge will be for this patient to become compliant in followup with outpatient appointments. I hope he will do this.

## 2013-02-21 NOTE — Progress Notes (Signed)
Patient discharged with instructions, prescriptions, and care notes.  Pt verbalized understanding.  Pt left the floor via w/c with staff.  He was taken to the ER so that he could be taken home.  He was in stable condition after discharge.

## 2013-02-23 LAB — METHYLMALONIC ACID, SERUM: Methylmalonic Acid, Quantitative: 0.83 umol/L — ABNORMAL HIGH (ref <0.40)

## 2013-05-18 ENCOUNTER — Emergency Department (HOSPITAL_COMMUNITY): Payer: Medicare Other

## 2013-05-18 ENCOUNTER — Encounter (HOSPITAL_COMMUNITY): Payer: Self-pay | Admitting: *Deleted

## 2013-05-18 ENCOUNTER — Inpatient Hospital Stay (HOSPITAL_COMMUNITY)
Admission: EM | Admit: 2013-05-18 | Discharge: 2013-05-21 | DRG: 291 | Disposition: A | Discharge status: Home / Self Care | Payer: Medicare Other | Attending: Internal Medicine | Admitting: Internal Medicine

## 2013-05-18 DIAGNOSIS — M79604 Pain in right leg: Secondary | ICD-10-CM

## 2013-05-18 DIAGNOSIS — R7989 Other specified abnormal findings of blood chemistry: Secondary | ICD-10-CM | POA: Diagnosis present

## 2013-05-18 DIAGNOSIS — F329 Major depressive disorder, single episode, unspecified: Secondary | ICD-10-CM | POA: Diagnosis present

## 2013-05-18 DIAGNOSIS — I4891 Unspecified atrial fibrillation: Secondary | ICD-10-CM | POA: Diagnosis present

## 2013-05-18 DIAGNOSIS — F3289 Other specified depressive episodes: Secondary | ICD-10-CM | POA: Diagnosis present

## 2013-05-18 DIAGNOSIS — I214 Non-ST elevation (NSTEMI) myocardial infarction: Secondary | ICD-10-CM

## 2013-05-18 DIAGNOSIS — I10 Essential (primary) hypertension: Secondary | ICD-10-CM | POA: Diagnosis present

## 2013-05-18 DIAGNOSIS — E669 Obesity, unspecified: Secondary | ICD-10-CM | POA: Diagnosis present

## 2013-05-18 DIAGNOSIS — I5023 Acute on chronic systolic (congestive) heart failure: Secondary | ICD-10-CM | POA: Diagnosis present

## 2013-05-18 DIAGNOSIS — R748 Abnormal levels of other serum enzymes: Secondary | ICD-10-CM | POA: Diagnosis present

## 2013-05-18 DIAGNOSIS — R0600 Dyspnea, unspecified: Secondary | ICD-10-CM

## 2013-05-18 DIAGNOSIS — Z8673 Personal history of transient ischemic attack (TIA), and cerebral infarction without residual deficits: Secondary | ICD-10-CM

## 2013-05-18 DIAGNOSIS — I509 Heart failure, unspecified: Secondary | ICD-10-CM | POA: Diagnosis present

## 2013-05-18 DIAGNOSIS — Z91199 Patient's noncompliance with other medical treatment and regimen due to unspecified reason: Secondary | ICD-10-CM

## 2013-05-18 DIAGNOSIS — E876 Hypokalemia: Secondary | ICD-10-CM | POA: Diagnosis present

## 2013-05-18 DIAGNOSIS — G4733 Obstructive sleep apnea (adult) (pediatric): Secondary | ICD-10-CM | POA: Diagnosis present

## 2013-05-18 DIAGNOSIS — Z9119 Patient's noncompliance with other medical treatment and regimen: Secondary | ICD-10-CM

## 2013-05-18 DIAGNOSIS — I059 Rheumatic mitral valve disease, unspecified: Secondary | ICD-10-CM

## 2013-05-18 DIAGNOSIS — F259 Schizoaffective disorder, unspecified: Secondary | ICD-10-CM | POA: Diagnosis present

## 2013-05-18 DIAGNOSIS — I5033 Acute on chronic diastolic (congestive) heart failure: Principal | ICD-10-CM | POA: Diagnosis present

## 2013-05-18 DIAGNOSIS — T502X5A Adverse effect of carbonic-anhydrase inhibitors, benzothiadiazides and other diuretics, initial encounter: Secondary | ICD-10-CM | POA: Diagnosis present

## 2013-05-18 DIAGNOSIS — J9600 Acute respiratory failure, unspecified whether with hypoxia or hypercapnia: Secondary | ICD-10-CM | POA: Diagnosis present

## 2013-05-18 DIAGNOSIS — R600 Localized edema: Secondary | ICD-10-CM

## 2013-05-18 DIAGNOSIS — Z6841 Body Mass Index (BMI) 40.0 and over, adult: Secondary | ICD-10-CM

## 2013-05-18 DIAGNOSIS — M109 Gout, unspecified: Secondary | ICD-10-CM | POA: Diagnosis present

## 2013-05-18 LAB — URINALYSIS, ROUTINE W REFLEX MICROSCOPIC
Ketones, ur: NEGATIVE mg/dL
Leukocytes, UA: NEGATIVE
Nitrite: NEGATIVE
Specific Gravity, Urine: <1.005 — ABNORMAL LOW (ref 1.005–1.030)
pH: 6.5 (ref 5.0–8.0)

## 2013-05-18 LAB — BLOOD GAS, ARTERIAL
Expiratory PAP: 7
Inspiratory PAP: 14
pCO2 arterial: 53.8 mmHg — ABNORMAL HIGH (ref 35.0–45.0)
pO2, Arterial: 121 mmHg — ABNORMAL HIGH (ref 80.0–100.0)

## 2013-05-18 LAB — BASIC METABOLIC PANEL
BUN: 14 mg/dL (ref 6–23)
Calcium: 9.2 mg/dL (ref 8.4–10.5)
Chloride: 103 mEq/L (ref 96–112)
Creatinine, Ser: 1.25 mg/dL (ref 0.50–1.35)
GFR calc Af Amer: 70 mL/min — ABNORMAL LOW (ref >90)

## 2013-05-18 LAB — PRO B NATRIURETIC PEPTIDE: Pro B Natriuretic peptide (BNP): 1443 pg/mL — ABNORMAL HIGH (ref 0–125)

## 2013-05-18 LAB — CBC WITH DIFFERENTIAL/PLATELET
Basophils Absolute: 0 10*3/uL (ref 0.0–0.1)
Basophils Relative: 0 % (ref 0–1)
Eosinophils Relative: 2 % (ref 0–5)
HCT: 45 % (ref 39.0–52.0)
MCHC: 32.7 g/dL (ref 30.0–36.0)
Monocytes Absolute: 0.6 10*3/uL (ref 0.1–1.0)
Neutro Abs: 5.3 10*3/uL (ref 1.7–7.7)
RDW: 14.6 % (ref 11.5–15.5)

## 2013-05-18 LAB — TROPONIN I
Troponin I: <0.3 ng/mL (ref <0.30)
Troponin I: 0.35 ng/mL (ref <0.30)

## 2013-05-18 LAB — PROTIME-INR: INR: 1.09 (ref 0.00–1.49)

## 2013-05-18 MED ORDER — GUAIFENESIN-DM 100-10 MG/5ML PO SYRP: 5.0000 mL | ORAL_SOLUTION | ORAL | Status: DC | PRN

## 2013-05-18 MED ORDER — BISACODYL 10 MG RE SUPP: 10.0000 mg | Freq: Every day | RECTAL | Status: DC | PRN

## 2013-05-18 MED ORDER — ACETAMINOPHEN 325 MG PO TABS
650.0000 mg | ORAL_TABLET | Freq: Four times a day (QID) | ORAL | Status: DC | PRN
  Filled 2013-05-18: qty 2

## 2013-05-18 MED ORDER — ASPIRIN 81 MG PO CHEW
324.0000 mg | CHEWABLE_TABLET | Freq: Once | ORAL | Status: AC
  Administered 2013-05-18: 324 mg via ORAL
  Filled 2013-05-18: qty 4

## 2013-05-18 MED ORDER — DILTIAZEM HCL 100 MG IV SOLR
5.0000 mg/h | INTRAVENOUS | Status: DC
  Administered 2013-05-18: 5 mg/h via INTRAVENOUS
  Filled 2013-05-18: qty 100

## 2013-05-18 MED ORDER — LEVALBUTEROL HCL 0.63 MG/3ML IN NEBU
0.6300 mg | INHALATION_SOLUTION | Freq: Four times a day (QID) | RESPIRATORY_TRACT | Status: DC
  Administered 2013-05-19 – 2013-05-21 (×9): 0.63 mg via RESPIRATORY_TRACT
  Filled 2013-05-18 (×9): qty 3

## 2013-05-18 MED ORDER — ONDANSETRON HCL 4 MG PO TABS: 4.0000 mg | ORAL_TABLET | Freq: Four times a day (QID) | ORAL | Status: DC | PRN

## 2013-05-18 MED ORDER — DILTIAZEM HCL 25 MG/5ML IV SOLN
10.0000 mg | Freq: Once | INTRAVENOUS | Status: AC
  Administered 2013-05-18: 10 mg via INTRAVENOUS

## 2013-05-18 MED ORDER — SODIUM CHLORIDE 0.9 % IV SOLN: Freq: Once | INTRAVENOUS | Status: DC

## 2013-05-18 MED ORDER — FUROSEMIDE 10 MG/ML IJ SOLN
40.0000 mg | INTRAMUSCULAR | Status: AC
  Administered 2013-05-18: 40 mg via INTRAVENOUS
  Filled 2013-05-18: qty 4

## 2013-05-18 MED ORDER — ASPIRIN EC 81 MG PO TBEC
81.0000 mg | DELAYED_RELEASE_TABLET | Freq: Every day | ORAL | Status: DC
  Administered 2013-05-19 – 2013-05-20 (×2): 81 mg via ORAL
  Filled 2013-05-18 (×3): qty 1

## 2013-05-18 MED ORDER — SENNA 8.6 MG PO TABS
1.0000 | ORAL_TABLET | Freq: Two times a day (BID) | ORAL | Status: DC
  Administered 2013-05-18 – 2013-05-20 (×6): 8.6 mg via ORAL
  Filled 2013-05-18 (×6): qty 1

## 2013-05-18 MED ORDER — FUROSEMIDE 10 MG/ML IJ SOLN
40.0000 mg | Freq: Two times a day (BID) | INTRAMUSCULAR | Status: DC
  Administered 2013-05-18 – 2013-05-19 (×3): 40 mg via INTRAVENOUS
  Filled 2013-05-18 (×3): qty 4

## 2013-05-18 MED ORDER — NITROGLYCERIN IN D5W 200-5 MCG/ML-% IV SOLN
5.0000 ug/min | Freq: Once | INTRAVENOUS | Status: AC
  Administered 2013-05-18: 5 ug/min via INTRAVENOUS
  Filled 2013-05-18: qty 250

## 2013-05-18 MED ORDER — LISINOPRIL 10 MG PO TABS
10.0000 mg | ORAL_TABLET | Freq: Every day | ORAL | Status: DC
  Administered 2013-05-18 – 2013-05-20 (×3): 10 mg via ORAL
  Filled 2013-05-18 (×3): qty 1

## 2013-05-18 MED ORDER — ONDANSETRON HCL 4 MG/2ML IJ SOLN: 4.0000 mg | Freq: Four times a day (QID) | INTRAMUSCULAR | Status: DC | PRN

## 2013-05-18 MED ORDER — LEVALBUTEROL HCL 0.63 MG/3ML IN NEBU: 0.6300 mg | INHALATION_SOLUTION | Freq: Four times a day (QID) | RESPIRATORY_TRACT | Status: DC | PRN

## 2013-05-18 MED ORDER — ENOXAPARIN SODIUM 40 MG/0.4ML ~~LOC~~ SOLN
40.0000 mg | SUBCUTANEOUS | Status: DC
  Administered 2013-05-18 – 2013-05-19 (×2): 40 mg via SUBCUTANEOUS
  Filled 2013-05-18 (×2): qty 0.4

## 2013-05-18 MED ORDER — ONDANSETRON HCL 4 MG/2ML IJ SOLN: 4.0000 mg | Freq: Three times a day (TID) | INTRAMUSCULAR | Status: AC | PRN

## 2013-05-18 MED ORDER — ALPRAZOLAM 0.25 MG PO TABS
0.2500 mg | ORAL_TABLET | Freq: Two times a day (BID) | ORAL | Status: DC | PRN
  Administered 2013-05-18 – 2013-05-19 (×2): 0.25 mg via ORAL
  Filled 2013-05-18 (×2): qty 1

## 2013-05-18 MED ORDER — DILTIAZEM HCL 100 MG IV SOLR
5.0000 mg/h | Freq: Once | INTRAVENOUS | Status: AC
  Administered 2013-05-18: 5 mg/h via INTRAVENOUS
  Filled 2013-05-18: qty 100

## 2013-05-18 MED ORDER — MORPHINE SULFATE 2 MG/ML IJ SOLN: 1.0000 mg | INTRAMUSCULAR | Status: DC | PRN

## 2013-05-18 MED ORDER — HYDROCODONE-ACETAMINOPHEN 5-325 MG PO TABS
1.0000 | ORAL_TABLET | ORAL | Status: DC | PRN
  Administered 2013-05-18 – 2013-05-19 (×2): 1 via ORAL
  Filled 2013-05-18 (×2): qty 1

## 2013-05-18 MED ORDER — SODIUM CHLORIDE 0.9 % IV SOLN
INTRAVENOUS | Status: DC
  Administered 2013-05-18: 10:00:00 via INTRAVENOUS
  Administered 2013-05-18: 10 mL/h via INTRAVENOUS

## 2013-05-18 MED ORDER — ACETAMINOPHEN 650 MG RE SUPP: 650.0000 mg | Freq: Four times a day (QID) | RECTAL | Status: DC | PRN

## 2013-05-18 MED ORDER — ALUM & MAG HYDROXIDE-SIMETH 200-200-20 MG/5ML PO SUSP: 30.0000 mL | Freq: Four times a day (QID) | ORAL | Status: DC | PRN

## 2013-05-18 NOTE — Progress Notes (Signed)
Patient found off bipap with respirations in the 40's. Pt placed back on bipap 14/7. Tolerating well right now, RT will continue to monitor

## 2013-05-18 NOTE — ED Notes (Signed)
Report called to [place], RN in ICU.

## 2013-05-18 NOTE — ED Notes (Signed)
Began having sudden shortness of breath this A.M.  FDP tried high flow [place] O2 w/out results and was changed to 15L NRB.  Sats 99% at site.  BP 191/127 on site.  CBG 199.

## 2013-05-18 NOTE — ED Notes (Signed)
CRITICAL VALUE ALERT  Critical value received:  troponin  Date of notification:  05/18/13  Time of notification:  1046  Critical value read back:yes  Nurse who received alert:  Lake Bells, RN  MD notified (1st page):  Dr Hyacinth Meeker at [phone]

## 2013-05-18 NOTE — ED Provider Notes (Signed)
History    This chart was scribed for Todd Roller, MD, by Yevette Edwards, ED Scribe. This patient was seen in room [facility] and the patient's care was started at 10:06 AM.  CSN: [id number] Arrival date & time 05/18/13  0956  None    Chief Complaint  Patient presents with  . Shortness of Breath   Level 5 Caveat (Respiratory Distress)  The history is provided by the patient. No language interpreter was used.   HPI Comments: Todd Page is a 62 y.o. male who presents to the Emergency Department complaining of several days of gradually worsening SOB which became severe this morning. As an associated symptom, he has also experienced bilateral swelling to his legs for approximately one month and the swelling has recently become severe. The pt reports he has a h/o of heart surgeries; he is unsure what is was for - no heart surgeries are registered on his past surgical history and he does not have a surgical scar. He denies a h/o of irregular heart rate (has a history of atrial fibrillation well-documented), strokes, or blood clots.  He denies taking lasix, though lasix has been prescribed to him.  The pt denies cocaine usage or smoking cigarettes. The patient does have some schizoaffective disorder.  Past Medical History  Diagnosis Date  . Hypertension     minimal coronary atherosclerosis by CT  . Schizoaffective disorder     With depression  . Atrial fibrillation 06/2012    onset 06/2012; normal TSH; EF-45%  . Overweight(278.02)   . Gout   . Noncompliance 09/21/2012  . Cholelithiasis     incidental finding on CT in 2013  . Systolic dysfunction    Past Surgical History  Procedure Laterality Date  . Colonoscopy  2006    normal screening examination  . Cardiac surgery      ? history accurate; no apparent incisions   Family History  Problem Relation Age of Onset  . Hypertension Mother   . Liver disease Father    History  Substance Use Topics  . Smoking status: Never Smoker    . Smokeless tobacco: Not on file  . Alcohol Use: Yes     Comment: former     Review of Systems  Unable to perform ROS: Severe respiratory distress    Allergies  Review of patient's allergies indicates no known allergies.  Home Medications   Current Outpatient Rx  Name  Route  Sig  Dispense  Refill  . aspirin 81 MG tablet   Oral   Take 1 tablet (81 mg total) by mouth daily.   30 tablet   1   . Cyanocobalamin (VITAMIN B 12 PO)   Oral   Take 1 tablet by mouth daily.         Marland Kitchen ibuprofen (ADVIL,MOTRIN) 200 MG tablet   Oral   Take 400 mg by mouth every 6 (six) hours as needed for pain.          Triage Vitals: BP 164/146  Pulse 123  Temp(Src) 97.7 F (36.5 C) (Oral)  Resp 33  Ht 6' (1.829 m)  Wt 330 lb (149.687 kg)  BMI 44.75 kg/m2  SpO2 100%  Physical Exam  Nursing note and vitals reviewed. Constitutional: He is oriented to person, place, and time. He appears distressed.  Morbidly obese  HENT:  Head: Normocephalic and atraumatic.  Mouth/Throat: Oropharynx is clear and moist. No oropharyngeal exudate.  Eyes: Conjunctivae and EOM are normal. Pupils are equal, round,  and reactive to light. Right eye exhibits no discharge. Left eye exhibits no discharge. No scleral icterus.  Neck: Normal range of motion. Neck supple. No JVD present. No tracheal deviation present. No thyromegaly present.  Cardiovascular:  Tachycardic, atrial fibrillation with rapid ventricular response, pulses are palpable at the radial arteries  Pulmonary/Chest: He is in respiratory distress. He has no wheezes. He has rales ( rales at bases).  Speaks in one to 2 word sentences, severe tachypnea, shallow breathing  Abdominal: Soft. Bowel sounds are normal. He exhibits no distension and no mass. There is no tenderness.  Musculoskeletal: He exhibits edema ( Gross bilateral lower extra many edema, severe, pitting) and tenderness ( Severe erythema of the bilateral lower extremities).  Pitting edema  rises to the level of the upper thigh bilaterally  Lymphadenopathy:    He has no cervical adenopathy.  Neurological: He is alert and oriented to person, place, and time. Coordination normal.  Skin: Skin is warm and dry. There is erythema.  Psychiatric: He has a normal mood and affect. His behavior is normal.    ED Course  Procedures (including critical care time)  DIAGNOSTIC STUDIES: Oxygen Saturation is 100% on [place], normal by my interpretation.    COORDINATION OF CARE:  11:40 AM-Discussed treatment plan with patient, and the patient agreed to the plan.   Labs Reviewed  BASIC METABOLIC PANEL - Abnormal; Notable for the following:    Glucose, Bld 105 (*)    GFR calc non Af Amer 61 (*)    GFR calc Af Amer 70 (*)    All other components within normal limits  TROPONIN I - Abnormal; Notable for the following:    Troponin I 0.32 (*)    All other components within normal limits  PRO B NATRIURETIC PEPTIDE - Abnormal; Notable for the following:    Pro B Natriuretic peptide (BNP) 1443.0 (*)    All other components within normal limits  URINALYSIS, ROUTINE W REFLEX MICROSCOPIC - Abnormal; Notable for the following:    Specific Gravity, Urine <1.005 (*)    Hgb urine dipstick TRACE (*)    All other components within normal limits  CBC WITH DIFFERENTIAL  APTT  PROTIME-INR  URINE MICROSCOPIC-ADD ON   Dg Chest Port 1 View  05/18/2013   *RADIOLOGY REPORT*  Clinical Data: Chest pain and shortness of breath.  PORTABLE CHEST - 1 VIEW  Comparison: Chest x-ray 02/17/2013.  Findings: Lung volumes are low.  The film is under penetrated, which limits the diagnostic sensitivity and specificity of this examination.  With this limitation in mind, there appears to be opacification of the base of the left hemithorax which may represent atelectasis and/or consolidation.  Probable trace bilateral pleural effusions.  There is cephalization of the pulmonary vasculature and slight indistinctness of the  interstitial markings suggestive of mild pulmonary edema.  Mild cardiomegaly. The patient is rotated to the left on today's exam, resulting in distortion of the mediastinal contours and reduced diagnostic sensitivity and specificity for mediastinal pathology.  IMPRESSION: 1.  Findings are concerning for mild congestive heart failure with trace bilateral pleural effusions. 2.  Possible area of atelectasis and/or consolidation in the left lower lobe.   Original Report Authenticated By: Trudie Reed, M.D.   1. Acute CHF   2. NSTEMI (non-ST elevated myocardial infarction)   3. Atrial fibrillation with rapid ventricular response     MDM  The patient is in acute respiratory distress. He does have what appears to be in congestive heart  failure exacerbation. He is also in atrial fibrillation with a rapid ventricular rate. According to his medication list he is supposed to be taking Lasix, Cardizem, carvedilol, lisinopril. He does not have any anticoagulation on his list other than a baby aspirin. He has expressed his understanding of the need for further oxygenation and help with ventilation with BiPAP, if he fails this he will need intubation, that being said his oxygenation is 95-97% on nasal cannula and his mentation is appropriate at this time.  According to the nursing staff the patient frequently feels like he has had surgical procedures which she has not had, this is not a new finding and likely related to his schizoaffective disorder.  ED ECG REPORT  I personally interpreted this EKG   Date: 05/18/2013   Rate: 122  Rhythm: atrial fibrillation with rapid ventricular response  QRS Axis: normal  Intervals: normal  ST/T Wave abnormalities: nonspecific ST/T changes  Conduction Disutrbances:none  Narrative Interpretation:   Old EKG Reviewed: Compared with 02/17/2013, no significant changes are seen  Review of the medical records shows that the patient has been admitted to the hospital multiple  times for atrial fibrillation and congestive heart failure. He has had a cardiology consultation for the atrial fibrillation but has had no provocative testing on record, no heart catheterizations on record. The patient has an elevated troponin of 0.3 however he is also in congestive heart failure with a BNP over 1000 and pulmonary edema on his chest x-ray. He is currently receiving Cardizem nitroglycerin, nitroglycerin by drip and has received Lasix IV. I will discuss his care with the hospitalist for admission to the hospital, aspirin has been given, there is no EKG changes to suggest acute ischemia.  The care was discussed with Dr. Kerry Hough who will admit the patient to the intensive care unit. The patient is critically ill and receiving intravenous nitroglycerin, intravenous Lasix, intravenous Cardizem by a drip. The troponin is borderline elevated, in discussion with the hospitalist there is question whether this is true ischemia or just related to this congestive heart failure. They will place him in the intensive. For appropriate monitoring at this time. The patient is critically ill but appears more comofrotable on Bipap  CRITICAL CARE Performed by: Todd Page Total critical care time: 35 Critical care time was exclusive of separately billable procedures and treating other patients. Critical care was necessary to treat or prevent imminent or life-threatening deterioration. Critical care was time spent personally by me on the following activities: development of treatment plan with patient and/or surrogate as well as nursing, discussions with consultants, evaluation of patient's response to treatment, examination of patient, obtaining history from patient or surrogate, ordering and performing treatments and interventions, ordering and review of laboratory studies, ordering and review of radiographic studies, pulse oximetry and re-evaluation of patient's condition.  I personally performed the  services described in this documentation, which was scribed in my presence. The recorded information has been reviewed and is accurate.     Todd Roller, MD 05/18/13 1141

## 2013-05-18 NOTE — Progress Notes (Signed)
*  PRELIMINARY RESULTS* Echocardiogram 2D Echocardiogram has been performed.  Conrad Aplington 05/18/2013, 4:29 PM

## 2013-05-18 NOTE — H&P (Signed)
[name] History and Physical  Todd Page WUJ:[id number] DOB: 1951-06-04 DOA: 05/18/2013  Referring physician:  PCP: No PCP Per Patient  Specialists:   Chief Complaint: shortness of breath  HPI: Todd Page is a 62 y.o. male with past medical history significant for CHF, A. fib, hypertension, noncompliance, schizoaffective disorder, obesity who presents to the emergency room with a chief complaint of shortness of breath. Information is obtained from the patient. He states that he awakened this morning with worsening shortness of breath. Associated symptoms include intermittent nonproductive cough as well as worsening bilateral lower extremity edema. He reports that his lower extremities stay swollen to a certain degree but in the last day or so it has worsened. He denies any chest pain/palpitation. When asked about his medications he states that he does not take any medications. Chart review indicates patient was discharged in April of this year on Lasix among other medications. In the ED patient was tachycardic and EKG yielded atrial fib with rapid ventricular response. He was given IV Lasix, nitroglycerin via a drip as well as Cardizem via a drip in the emergency room. He demonstrated severe tachypnea with shallow breathing and could only speak in one to 2 word sentences. He was placed on BiPAP and improved markedly. Lab work significant for proBNP 1443.0, troponin 0.32. EKG yields atrial fibrillation with rapid ventricular response and chest x-ray findings are concerning for mild congestive heart failure with trace bilateral pleural effusions.  Possible area of atelectasis and/or consolidation in the left lower lobe. Symptoms came on suddenly have persisted and worsened characterized as severe. [name] were asked to admit.    Review of Systems: The patient denies anorexia, fever, weight loss,, vision loss, decreased hearing, hoarseness, chest pain, syncope,  peripheral edema,  balance deficits, hemoptysis, abdominal pain, melena, hematochezia, severe indigestion/heartburn, hematuria, incontinence, genital sores, muscle weakness, suspicious skin lesions, transient blindness, difficulty walking, depression, unusual weight change, abnormal bleeding, enlarged lymph nodes, angioedema, and breast masses.    Past Medical History  Diagnosis Date  . Hypertension     minimal coronary atherosclerosis by CT  . Schizoaffective disorder     With depression  . Atrial fibrillation 06/2012    onset 06/2012; normal TSH; EF-45%  . Overweight(278.02)   . Gout   . Noncompliance 09/21/2012  . Cholelithiasis     incidental finding on CT in 2013  . Systolic dysfunction    Past Surgical History  Procedure Laterality Date  . Colonoscopy  2006    normal screening examination  . Cardiac surgery      ? history accurate; no apparent incisions   Social History:  reports that he has never smoked. He does not have any smokeless tobacco history on file. He reports that  drinks alcohol. He reports that he does not use illicit drugs.  Patient lives at home with his mother. He is on disability.  No Known Allergies  Family History  Problem Relation Age of Onset  . Hypertension Mother   . Liver disease Father      Prior to Admission medications   Medication Sig Start Date End Date Taking? Authorizing Provider  aspirin 81 MG tablet Take 1 tablet (81 mg total) by mouth daily. 01/21/13  Yes Erick Blinks, MD  Cyanocobalamin (VITAMIN B 12 PO) Take 1 tablet by mouth daily.   Yes Historical Provider, MD  ibuprofen (ADVIL,MOTRIN) 200 MG tablet Take 400 mg by mouth every 6 (six) hours as needed for pain.   Yes  Historical Provider, MD   Physical Exam: Filed Vitals:   05/18/13 1130 05/18/13 1145 05/18/13 1200 05/18/13 1215  BP: 158/118 159/100 156/99 160/112  Pulse: 171 87 35 114  Temp:      TempSrc:      Resp: 19 18 19 17   Height:      Weight:      SpO2: 100% 100% 99% 100%      General:  Obese, eyes closed but arouses to verbal stimuli, no acute distress  Eyes: PE RRL, EOMI,  ENT: Ears clear nose without drainage. Because membranes slightly dry pink.  Neck: Supple full range of motion no lymphadenopathy  Cardiovascular: Irregularly irregular no murmur gallop or rub. Lower extremities with 2+ getting edema  Respiratory: Oval effort on BiPAP. Breath sounds quite diminished throughout. Fine crackles in bilateral bases. No wheeze  Abdomen: Obese soft positive bowel sounds nontender to palpation  Skin: Warm dry lower extremities with erythema up to mid shin consistent with venous stasis.  Musculoskeletal: No clubbing no cyanosis  Psychiatric: Calm cooperative  Neurologic: Cranial nerves II through XII grossly intact speech slow but clear facial some  Labs on Admission:  Basic Metabolic Panel:  Recent Labs Lab 05/18/13 1023  NA 141  K 4.0  CL 103  CO2 32  GLUCOSE 105*  BUN 14  CREATININE 1.25  CALCIUM 9.2   Liver Function Tests: No results found for this basename: AST, ALT, ALKPHOS, BILITOT, PROT, ALBUMIN,  in the last 168 hours No results found for this basename: LIPASE, AMYLASE,  in the last 168 hours No results found for this basename: AMMONIA,  in the last 168 hours CBC:  Recent Labs Lab 05/18/13 1023  WBC 7.2  NEUTROABS 5.3  HGB 14.7  HCT 45.0  MCV 83.2  PLT 206   Cardiac Enzymes:  Recent Labs Lab 05/18/13 1023  TROPONINI 0.32*    BNP (last 3 results)  Recent Labs  01/19/13 0500 02/17/13 0945 05/18/13 1023  PROBNP 242.1* 1249.0* 1443.0*   CBG: No results found for this basename: GLUCAP,  in the last 168 hours  Radiological Exams on Admission: Dg Chest Port 1 View  05/18/2013   *RADIOLOGY REPORT*  Clinical Data: Chest pain and shortness of breath.  PORTABLE CHEST - 1 VIEW  Comparison: Chest x-ray 02/17/2013.  Findings: Lung volumes are low.  The film is under penetrated, which limits the diagnostic sensitivity  and specificity of this examination.  With this limitation in mind, there appears to be opacification of the base of the left hemithorax which may represent atelectasis and/or consolidation.  Probable trace bilateral pleural effusions.  There is cephalization of the pulmonary vasculature and slight indistinctness of the interstitial markings suggestive of mild pulmonary edema.  Mild cardiomegaly. The patient is rotated to the left on today's exam, resulting in distortion of the mediastinal contours and reduced diagnostic sensitivity and specificity for mediastinal pathology.  IMPRESSION: 1.  Findings are concerning for mild congestive heart failure with trace bilateral pleural effusions. 2.  Possible area of atelectasis and/or consolidation in the left lower lobe.   Original Report Authenticated By: Trudie Reed, M.D.    EKG: Independently reviewed. Fibrillation with rapid ventricular response.  Assessment/Plan Principal Problem:   Acute respiratory failure: Admit to ICU. Continue BiPAP that was initiated in the emergency room check an ABG. Marked improvement since Lasix and nitroglycerin started in the emergency room. Bipap per respiratory. Active Problems: Acute on chronic systolic HF (heart failure): Patient with known noncompliance regarding medications.  Has diuresed with IV Lasix. Will continue Lasix 40 mg IV every 12. Will continue nitro drip. Will get updated 2-D echo. Chart review indicates no documentation of any recent stress evaluation. Request cardiology consult. Will monitor strict intake and output and obtain daily weights. Wean off nitro gtt as condition improves    Elevated troponin: Mild. Patient denies chest pain. Will cycle cardiac enzymes. Will continue aspirin. Patient on nitroglycerin drip. Will request cardiology consult  Atrial fibrillation with rapid ventricular response: Likely related to #1 and #2. Patient on Cardizem drip. Noncompliance with medications. At discharge in  April patient was on 12.5 of Corag 240 mg of diltiazem M. and 40 of Lasix daily as well as imdur.  Will transition to po meds once he is off Bipap    Obesity: Will request nutritional consult when acute phase of illness resolved    Schizoaffective disorder: Appears stable at baseline. Chart review indicates patient chronically noncompliant with meds medications and evaluated by telemetry psych in April admission. Patient has suffered from delusions in the past according to chart. Denies at time of admission.    Noncompliance: History of same. Likely related to his schizoaffective disorder.   Cardiology  Code Status: full Family Communication: none Disposition Plan: Home when ready likely 3 or 4 day  Time spent: 75 minutes  Gwenyth Bender [name] Pager [phone]  If 7PM-7AM, please contact night-coverage [email or web address] Password [facility] 05/18/2013, 12:46 PM  Attending note:  Patient seen and independently examined.  Above note reviewed and amended.  Agree with findings as noted above.  Tamanna Whitson

## 2013-05-19 LAB — CBC
MCH: 26.8 pg (ref 26.0–34.0)
MCHC: 31.6 g/dL (ref 30.0–36.0)
Platelets: 242 10*3/uL (ref 150–400)

## 2013-05-19 LAB — BASIC METABOLIC PANEL
Calcium: 9.2 mg/dL (ref 8.4–10.5)
GFR calc non Af Amer: 57 mL/min — ABNORMAL LOW (ref >90)
Glucose, Bld: 118 mg/dL — ABNORMAL HIGH (ref 70–99)
Sodium: 142 mEq/L (ref 135–145)

## 2013-05-19 MED ORDER — CLONIDINE HCL 0.1 MG PO TABS
0.1000 mg | ORAL_TABLET | Freq: Once | ORAL | Status: AC
  Administered 2013-05-19: 0.1 mg via ORAL
  Filled 2013-05-19: qty 1

## 2013-05-19 MED ORDER — FUROSEMIDE 40 MG PO TABS
60.0000 mg | ORAL_TABLET | Freq: Two times a day (BID) | ORAL | Status: DC
  Administered 2013-05-19 – 2013-05-20 (×2): 60 mg via ORAL
  Filled 2013-05-19 (×2): qty 1

## 2013-05-19 MED ORDER — DILTIAZEM HCL ER COATED BEADS 240 MG PO CP24
240.0000 mg | ORAL_CAPSULE | Freq: Every day | ORAL | Status: DC
  Administered 2013-05-19: 240 mg via ORAL
  Filled 2013-05-19: qty 1

## 2013-05-19 MED ORDER — ENOXAPARIN SODIUM 80 MG/0.8ML ~~LOC~~ SOLN
80.0000 mg | Freq: Every day | SUBCUTANEOUS | Status: DC
  Administered 2013-05-20: 80 mg via SUBCUTANEOUS
  Filled 2013-05-19: qty 0.8

## 2013-05-19 NOTE — Progress Notes (Signed)
UR chart review completed.  

## 2013-05-19 NOTE — Progress Notes (Signed)
[name] PROGRESS NOTE  Todd Page [id number]:[id number] DOB: 09-26-1951 DOA: 05/18/2013 PCP: No PCP Per Patient  Assessment/Plan: Acute respiratory failure: resolved. Off of  BiPAP this morning.  sats 98% on 5L. Will wean oxygen as tolerated. If tolerates will transfer to floor this afternoon.  Active Problems:   Acute on chronic systolic HF (heart failure): Patient with known noncompliance regarding medications. Volume status -3.8L.  Will continue Lasix 40 mg IV every 12 for 1 more day.  NTG drip discontinued. Await results of  2-D echo. Chart review indicates no documentation of any recent stress evaluation. Await cardiology consult. Will monitor strict intake and output and obtain daily weights.   Elevated troponin: Mild. Patient continues to deny chest pain. Will continue aspirin. Await cardiology consult. EKG on admission yielded afib with RVR and no significant changes when compared to 4/14 EKG.  Atrial fibrillation with rapid ventricular response: Likely related to #1 and #2. Rate controlled. Cardizem drip on lowest dose. Will resume home dose cardizem and discontinue drip. Of note, at discharge in April patient was on 12.5 of Coreg 240 mg of diltiazem and 40 of Lasix daily as well as imdur. Will transition to po meds.  Hypokalemia: mild. Likely related to diuretics. Will replete and recheck.    Obesity: BMI 46.1. Will request nutritional consult when acute phase of illness resolved   Schizoaffective disorder: Appears stable at baseline. Chart review indicates patient chronically noncompliant with meds medications and evaluated by telemetry psych in April admission. Patient has suffered from delusions in the past according to chart. Denies at time of admission.   Noncompliance: History of same. Likely related to his schizoaffective disorder   Code Status: full Family Communication:  Disposition Plan: home 24-48  hours   Consultants:  Cardiology  Procedures:  none  Antibiotics:  none  HPI/Subjective: Sitting up in chair. Reports breathing "better" but states he is "on and off" bipap. Denies pain/discomfort.   Objective: Filed Vitals:   05/19/13 0315 05/19/13 0330 05/19/13 0400 05/19/13 0821  BP:      Pulse: 79 75 72   Temp:   98.1 F (36.7 C)   TempSrc:   Axillary   Resp: 16 24 17    Height:      Weight:      SpO2: 97% 97% 93% 98%    Intake/Output Summary (Last 24 hours) at 05/19/13 0834 Last data filed at 05/19/13 0500  Gross per 24 hour  Intake    545 ml  Output   4350 ml  Net  -3805 ml   Filed Weights   05/18/13 1001 05/18/13 1604  Weight: 330 lb (149.687 kg) 339 lb 8.1 oz (154 kg)    Exam:   General:  Obese NAD  Cardiovascular: irregularly irregular No MGR 3+LE edema(LE in dependant position in chair)  Respiratory: normal effort. Improved air movement. No wheeze. Very fine crackles bilateral bases.   Abdomen: obese soft +BS non-tender to palpaton  Musculoskeletal: no clubbing or cyanosis   Data Reviewed: Basic Metabolic Panel:  Recent Labs Lab 05/18/13 1023 05/19/13 0538  NA 141 142  K 4.0 3.3*  CL 103 99  CO2 32 31  GLUCOSE 105* 118*  BUN 14 16  CREATININE 1.25 1.32  CALCIUM 9.2 9.2   Liver Function Tests: No results found for this basename: AST, ALT, ALKPHOS, BILITOT, PROT, ALBUMIN,  in the last 168 hours No results found for this basename: LIPASE, AMYLASE,  in the last 168 hours No results found for  this basename: AMMONIA,  in the last 168 hours CBC:  Recent Labs Lab 05/18/13 1023 05/19/13 0538  WBC 7.2 7.3  NEUTROABS 5.3  --   HGB 14.7 14.5  HCT 45.0 45.9  MCV 83.2 84.7  PLT 206 242   Cardiac Enzymes:  Recent Labs Lab 05/18/13 1023 05/18/13 1532 05/18/13 2222  TROPONINI 0.32* <0.30 0.35*   BNP (last 3 results)  Recent Labs  01/19/13 0500 02/17/13 0945 05/18/13 1023  PROBNP 242.1* 1249.0* 1443.0*   CBG: No  results found for this basename: GLUCAP,  in the last 168 hours  Recent Results (from the past 240 hour(s))  MRSA PCR SCREENING     Status: None   Collection Time    05/18/13  3:25 PM      Result Value Range Status   MRSA by PCR NEGATIVE  NEGATIVE Final   Comment:            The GeneXpert MRSA Assay (FDA     approved for NASAL specimens     only), is one component of a     comprehensive MRSA colonization     surveillance program. It is not     intended to diagnose MRSA     infection nor to guide or     monitor treatment for     MRSA infections.     Studies: Dg Chest Port 1 View  05/18/2013   *RADIOLOGY REPORT*  Clinical Data: Chest pain and shortness of breath.  PORTABLE CHEST - 1 VIEW  Comparison: Chest x-ray 02/17/2013.  Findings: Lung volumes are low.  The film is under penetrated, which limits the diagnostic sensitivity and specificity of this examination.  With this limitation in mind, there appears to be opacification of the base of the left hemithorax which may represent atelectasis and/or consolidation.  Probable trace bilateral pleural effusions.  There is cephalization of the pulmonary vasculature and slight indistinctness of the interstitial markings suggestive of mild pulmonary edema.  Mild cardiomegaly. The patient is rotated to the left on today's exam, resulting in distortion of the mediastinal contours and reduced diagnostic sensitivity and specificity for mediastinal pathology.  IMPRESSION: 1.  Findings are concerning for mild congestive heart failure with trace bilateral pleural effusions. 2.  Possible area of atelectasis and/or consolidation in the left lower lobe.   Original Report Authenticated By: Trudie Reed, M.D.    Scheduled Meds: . aspirin EC  81 mg Oral Daily  . enoxaparin (LOVENOX) injection  40 mg Subcutaneous Q24H  . furosemide  40 mg Intravenous Q12H  . levalbuterol  0.63 mg Nebulization Q6H  . lisinopril  10 mg Oral Daily  . senna  1 tablet Oral BID    Continuous Infusions: . sodium chloride 10 mL/hr (05/18/13 1500)  . diltiazem (CARDIZEM) infusion 5 mg/hr (05/18/13 1800)    Principal Problem:   Acute respiratory failure Active Problems:   Obesity   Schizoaffective disorder   Noncompliance   Atrial fibrillation with rapid ventricular response   Acute on chronic systolic HF (heart failure)   Elevated troponin  Time spent: 35 minutes  [facility] M  [name] Pager [phone]. If 7PM-7AM, please contact night-coverage at [email or web address], password [facility] 05/19/2013, 8:34 AM  LOS: 1 day   Attending note:  Patient seen and examined.  Above note reviewed and agree.  Respiratory status improving with IV lasix.  He is currently in chair on nasal cannula.  Still having episodes of hypoxia. On cardizem gtt for atrial fib.  Can  likely transition to po meds.  Anticipate he will be in the hospital for a few more days.  Will try and arrange outpatient primary care follow up and home health.  Cardiology consult pending. It does not appear that he has had any ischemic testing in the past.  MEMON,JEHANZEB

## 2013-05-19 NOTE — Progress Notes (Signed)
Patient is off bipap, on 5LNC. Tolerating well at this time. RT will continue to monitor

## 2013-05-19 NOTE — Progress Notes (Signed)
Pt refuses to wear BP cuff. Nursing staff has placed it back on the pt several times but each return to the room shows the BP cuff off the pt in the floor. Importance of BP monitoring while on the Cardizem drip has been explained to pt but pt still remains noncompliant. Will attempt to do spot checks. HR is 80's to 90's at this time yet remains in an irregular rhythm.

## 2013-05-20 DIAGNOSIS — E876 Hypokalemia: Secondary | ICD-10-CM

## 2013-05-20 DIAGNOSIS — I5033 Acute on chronic diastolic (congestive) heart failure: Principal | ICD-10-CM

## 2013-05-20 DIAGNOSIS — Z9119 Patient's noncompliance with other medical treatment and regimen: Secondary | ICD-10-CM

## 2013-05-20 DIAGNOSIS — I509 Heart failure, unspecified: Secondary | ICD-10-CM

## 2013-05-20 DIAGNOSIS — I10 Essential (primary) hypertension: Secondary | ICD-10-CM

## 2013-05-20 LAB — BASIC METABOLIC PANEL
BUN: 16 mg/dL (ref 6–23)
GFR calc Af Amer: 60 mL/min — ABNORMAL LOW (ref >90)
GFR calc non Af Amer: 52 mL/min — ABNORMAL LOW (ref >90)
Potassium: 3.2 mEq/L — ABNORMAL LOW (ref 3.5–5.1)
Sodium: 143 mEq/L (ref 135–145)

## 2013-05-20 LAB — CBC
Hemoglobin: 13.7 g/dL (ref 13.0–17.0)
MCHC: 32.2 g/dL (ref 30.0–36.0)

## 2013-05-20 LAB — TROPONIN I
Troponin I: <0.3 ng/mL (ref <0.30)
Troponin I: <0.3 ng/mL (ref <0.30)

## 2013-05-20 MED ORDER — POTASSIUM CHLORIDE CRYS ER 20 MEQ PO TBCR
40.0000 meq | EXTENDED_RELEASE_TABLET | Freq: Once | ORAL | Status: AC
  Administered 2013-05-20: 40 meq via ORAL
  Filled 2013-05-20: qty 2

## 2013-05-20 MED ORDER — FUROSEMIDE 10 MG/ML IJ SOLN
80.0000 mg | Freq: Two times a day (BID) | INTRAMUSCULAR | Status: DC
  Administered 2013-05-20 (×2): 80 mg via INTRAVENOUS
  Filled 2013-05-20 (×2): qty 8

## 2013-05-20 MED ORDER — DILTIAZEM HCL ER COATED BEADS 180 MG PO CP24
360.0000 mg | ORAL_CAPSULE | Freq: Every day | ORAL | Status: DC
  Administered 2013-05-20: 360 mg via ORAL
  Filled 2013-05-20: qty 2

## 2013-05-20 MED ORDER — METOLAZONE 5 MG PO TABS
5.0000 mg | ORAL_TABLET | Freq: Every day | ORAL | Status: DC
  Administered 2013-05-20: 5 mg via ORAL
  Filled 2013-05-20: qty 1

## 2013-05-20 NOTE — Progress Notes (Signed)
Pt refuse to wear BIPAP again tonight; asked why he don't want to wear and he stated the confinement and mask of machine. Nurse Nedra Hai was in room when I spoke with patient. Also it was passed on in report that he didn't wear it the night before.

## 2013-05-20 NOTE — Care Management Note (Signed)
    Page 1 of 1   05/21/2013     9:43:04 AM   CARE MANAGEMENT NOTE 05/21/2013  Patient:  Todd Page, Todd Page   Account Number:  [id number]  Date Initiated:  05/20/2013  Documentation initiated by:  Sharrie Rothman  Subjective/Objective Assessment:   Pt admitted from home with CHF. Pt lives with his mother and will return home at discharge. Pt stated that he is still driving himself and his mother appts. Pt is independent with ADL's.     Action/Plan:   Will continue to follow for discharge planning needs. PCP appt made with [facility] and documented on pts AVS.   Anticipated DC Date:  05/22/2013   Anticipated DC Plan:  HOME/SELF CARE      DC Planning Services  CM consult      Choice offered to / List presented to:             Status of service:  Completed, signed off Medicare Important Message given?  YES (If response is "NO", the following Medicare IM given date fields will be blank) Date Medicare IM given:  05/21/2013 Date Additional Medicare IM given:    Discharge Disposition:  HOME/SELF CARE  Per UR Regulation:    If discussed at Long Length of Stay Meetings, dates discussed:    Comments:  05/21/13 0940 Arlyss Queen, RN BSN CM Pt discharged home today. Pt PCP arranged with [facility] and pt is aware of appt time. [facility] number given to pt for transportation. Pt and pts nurse aware of discharge arrangements.  05/20/13 1140 Arlyss Queen, RN BSN CM

## 2013-05-20 NOTE — Progress Notes (Signed)
Todd Page HYQ:[id number] DOB: 15-Mar-1951 DOA: 05/18/2013 PCP: No PCP Per Patient   Subjective: This man, who is well-known to the hospital, presents once again with the congestive heart failure. He says that he did not have any medications to take with them at home. He does have a history of noncompliance. He does have schizoaffective disorder.           Physical Exam: Blood pressure 164/127, pulse 97, temperature 98.5 F (36.9 C), temperature source Axillary, resp. rate 22, height 6' (1.829 m), weight 153.7 kg (338 lb 13.6 oz), SpO2 98.00%. He looks systemically well. He is obese. Lung fields are clear. He does have peripheral pitting edema of his legs up to his knees. He is in atrial fibrillation, ventricular rate somewhat controlled. He is alert and orientated.   Investigations:  Recent Results (from the past 240 hour(s))  MRSA PCR SCREENING     Status: None   Collection Time    05/18/13  3:25 PM      Result Value Range Status   MRSA by PCR NEGATIVE  NEGATIVE Final   Comment:            The GeneXpert MRSA Assay (FDA     approved for NASAL specimens     only), is one component of a     comprehensive MRSA colonization     surveillance program. It is not     intended to diagnose MRSA     infection nor to guide or     monitor treatment for     MRSA infections.     Basic Metabolic Panel:  Recent Labs  95/28/41 0538 05/20/13 0447  NA 142 143  K 3.3* 3.2*  CL 99 100  CO2 31 36*  GLUCOSE 118* 130*  BUN 16 16  CREATININE 1.32 1.42*  CALCIUM 9.2 9.2   Liver Function Tests:    CBC:  Recent Labs  05/18/13 1023 05/19/13 0538 05/20/13 0447  WBC 7.2 7.3 7.3  NEUTROABS 5.3  --   --   HGB 14.7 14.5 13.7  HCT 45.0 45.9 42.5  MCV 83.2 84.7 83.7  PLT 206 242 226    Dg Chest Port 1 View  05/18/2013   *RADIOLOGY REPORT*  Clinical Data: Chest pain and shortness of breath.  PORTABLE CHEST - 1 VIEW  Comparison: Chest x-ray 02/17/2013.  Findings: Lung volumes are  low.  The film is under penetrated, which limits the diagnostic sensitivity and specificity of this examination.  With this limitation in mind, there appears to be opacification of the base of the left hemithorax which may represent atelectasis and/or consolidation.  Probable trace bilateral pleural effusions.  There is cephalization of the pulmonary vasculature and slight indistinctness of the interstitial markings suggestive of mild pulmonary edema.  Mild cardiomegaly. The patient is rotated to the left on today's exam, resulting in distortion of the mediastinal contours and reduced diagnostic sensitivity and specificity for mediastinal pathology.  IMPRESSION: 1.  Findings are concerning for mild congestive heart failure with trace bilateral pleural effusions. 2.  Possible area of atelectasis and/or consolidation in the left lower lobe.   Original Report Authenticated By: Trudie Reed, M.D.      Medications: I have reviewed the patient's current medications.  Impression: 1. Acute on chronic diastolic congestive heart failure. Echocardiogram shows ejection fraction of 55% with severe LVH. I suspect in fact his heart failure is more of diastolic in nature at this point. 2. Obesity, morbid. 3. Atrial fibrillation  with rapid ventricular response, improving. 4. Schizoaffective disorder with history of noncompliance. 5. Elevated troponin level.  6. Hypokalemia.     Plan: 1. Add metolazone 5 mg daily to his oral Lasix. He does have challenges with IV access at the present time. 2. Increase Cardizem CD to 360 mg daily. 3. Replete potassium. 4. Patient can move to telemetry floor.  Consultants:  None.   Procedures: Echocardiogram: Study Conclusions  - Left ventricle: Possible distal septal hypokinesis The cavity size was mildly dilated. Wall thickness was increased in a pattern of severe LVH. The estimated ejection fraction was 55%. - Mitral valve: Mild regurgitation. - Left atrium:  The atrium was mildly dilated. - Pericardium, extracardiac: A trivial pericardial effusion was identified posterior to the heart. Transthoracic echocardiography. M-mode, complete 2D, spectral Doppler, and color Doppler. Height: Height: 182.9cm. Height: 72in. Weight: Weight: 149.7kg. Weight: 329.3lb. Body mass index: BMI: 44.8kg/m^2. Body surface area: BSA: 2.30m^2. Patient status: Inpatient. Location: ICU/CCU   Antibiotics:  None.                   Code Status: Full code.  Family Communication: Discussed plan with patient at the bedside.   Disposition Plan: Home when medically stable.  Time spent: 20 minutes.   LOS: 2 days   Todd Page Pager [phone]  05/20/2013, 8:16 AM

## 2013-05-20 NOTE — Progress Notes (Signed)
Nurse called and pt decided to wear BIPAP so machine place on patient. He tolerate well

## 2013-05-21 LAB — BASIC METABOLIC PANEL
BUN: 22 mg/dL (ref 6–23)
Creatinine, Ser: 1.66 mg/dL — ABNORMAL HIGH (ref 0.50–1.35)
GFR calc Af Amer: 50 mL/min — ABNORMAL LOW (ref >90)
GFR calc non Af Amer: 43 mL/min — ABNORMAL LOW (ref >90)
Potassium: 3.2 mEq/L — ABNORMAL LOW (ref 3.5–5.1)

## 2013-05-21 MED ORDER — ALPRAZOLAM 0.25 MG PO TABS: 0.2500 mg | ORAL_TABLET | Freq: Two times a day (BID) | ORAL | Status: DC | PRN

## 2013-05-21 MED ORDER — POTASSIUM CHLORIDE ER 20 MEQ PO TBCR: 40.0000 meq | EXTENDED_RELEASE_TABLET | Freq: Every day | ORAL | Status: DC

## 2013-05-21 MED ORDER — HYDROCODONE-ACETAMINOPHEN 5-325 MG PO TABS
1.0000 | ORAL_TABLET | ORAL | Status: DC | PRN
Start: 2013-05-21 — End: 2013-08-17

## 2013-05-21 MED ORDER — DILTIAZEM HCL ER COATED BEADS 360 MG PO CP24: 360.0000 mg | ORAL_CAPSULE | Freq: Every day | ORAL | Status: DC

## 2013-05-21 MED ORDER — LISINOPRIL 10 MG PO TABS: 10.0000 mg | ORAL_TABLET | Freq: Every day | ORAL | Status: DC

## 2013-05-21 MED ORDER — FUROSEMIDE 40 MG PO TABS: 40.0000 mg | ORAL_TABLET | Freq: Two times a day (BID) | ORAL | Status: DC

## 2013-05-21 NOTE — Progress Notes (Signed)
Upon returning to room to do 2 am treatment pt had BIPAP off  RN said pt had just taken it off and back on 3lpm cann.

## 2013-05-21 NOTE — Discharge Summary (Signed)
Physician Discharge Summary  Todd Page YNW:[id number] DOB: 11/26/1950 DOA: 05/18/2013  PCP: No PCP Per Patient  Admit date: 05/18/2013 Discharge date: 05/21/2013  Time spent: Greater than 30 minutes  Recommendations for Outpatient Follow-up:  1. Follow with new PCP. Renal function will need to be checked in the next week or 2.  Discharge Diagnoses:  1. Acute on chronic diastolic congestive heart failure. Ejection fraction of 55% with severe left ventricular hypertrophy. Improving. 2. Atrial fibrillation, rapid ventricular rate control. Possible anticoagulation candidate but because of noncompliance, this has not been initiated at this point. 3. Moderate obesity. 4. Schizoaffective disorder with history of noncompliance.   Discharge Condition: Stable and improved.  Diet recommendation: Low glycemic index nutrition.  Filed Weights   05/19/13 0851 05/20/13 0847 05/20/13 2214  Weight: 153.7 kg (338 lb 13.6 oz) 148.7 kg (327 lb 13.2 oz) 146.1 kg (322 lb 1.5 oz)    History of present illness:  This 62 year old man, who has a history of noncompliance with medications, partly due to schizoaffective disorder, presents to the hospital with symptoms of dyspnea. Please see initial history as outlined below: HPI: Todd Page is a 62 y.o. male with past medical history significant for CHF, A. fib, hypertension, noncompliance, schizoaffective disorder, obesity who presents to the emergency room with a chief complaint of shortness of breath. Information is obtained from the patient. He states that he awakened this morning with worsening shortness of breath. Associated symptoms include intermittent nonproductive cough as well as worsening bilateral lower extremity edema. He reports that his lower extremities stay swollen to a certain degree but in the last day or so it has worsened. He denies any chest pain/palpitation. When asked about his medications he states that he does not take any medications. Chart  review indicates patient was discharged in April of this year on Lasix among other medications. In the ED patient was tachycardic and EKG yielded atrial fib with rapid ventricular response. He was given IV Lasix, nitroglycerin via a drip as well as Cardizem via a drip in the emergency room. He demonstrated severe tachypnea with shallow breathing and could only speak in one to 2 word sentences. He was placed on BiPAP and improved markedly. Lab work significant for proBNP 1443.0, troponin 0.32. EKG yields atrial fibrillation with rapid ventricular response and chest x-ray findings are concerning for mild congestive heart failure with trace bilateral pleural effusions. Possible area of atelectasis and/or consolidation in the left lower lobe. Symptoms came on suddenly have persisted and worsened characterized as severe. [name] were asked to admit.  Hospital Course:  The patient was admitted and treated for adjusted properly. Echocardiogram showed that most of this was diastolic heart failure as his action fraction was normal and he had left ventricular hypertrophy. He also had atrial fibrillation with rapid ventricular response,. He was treated with Cardizem drip initially and then converted to oral Cardizem. This is controlled his ventricular rate. Anticoagulation was not started because of his noncompliance history. Since hospitalization, he has lost 16 pounds and feels somewhat better. I think that the rest of this treatment can be done as an outpatient. Unfortunately, he does not appear to have a primary care physician and we will set him up with one. He did have one troponin level that was increased but then subsequent troponin levels were normal. I do not think he has sustained an acute cardiac event. He also has morbid obesity with likely obstructive sleep apnea. He refused his BiPAP/CPAP machine. All  this will need to be sorted out as an outpatient. His creatinine prior to discharge was increasing  but he has been on intravenous Lasix as well as metolazone. This will not be continued as an outpatient obviously and I suspect his creatinine will improve. However, close check on his renal function is important and this will have to be done by his primary care physician.  Procedures:  Echocardiogram:  Study Conclusions  - Left ventricle: Possible distal septal hypokinesis The cavity size was mildly dilated. Wall thickness was increased in a pattern of severe LVH. The estimated ejection fraction was 55%. - Mitral valve: Mild regurgitation. - Left atrium: The atrium was mildly dilated. - Pericardium, extracardiac: A trivial pericardial effusion was identified posterior to the heart. Transthoracic echocardiography. M-mode, complete 2D, spectral Doppler, and color Doppler. Height: Height: 182.9cm. Height: 72in. Weight: Weight: 149.7kg. Weight: 329.3lb. Body mass index: BMI: 44.8kg/m^2. Body surface area: BSA: 2.82m^2. Patient status: Inpatient. Location: ICU/CCU  Consultations:  None.  Discharge Exam: Filed Vitals:   05/21/13 0400 05/21/13 0500 05/21/13 0600 05/21/13 0710  BP: 120/57     Pulse: 115 87 74   Temp:      TempSrc:      Resp: 17 20 21    Height:      Weight:      SpO2: 95% 96% 97% 99%    General: He is morbidly obese. Cardiovascular: Heart sounds are present and in atrial fibrillation, ventricular rate control. His jugular venous pressure not elevated. He does have peripheral pitting edema but this is less than when he was admitted. Respiratory: Lung fields are clinically clear. He is alert and orientated.  Discharge Instructions  Discharge Orders   Future Orders Complete By Expires     Diet - low sodium heart healthy  As directed     Increase activity slowly  As directed         Medication List    STOP taking these medications       ibuprofen 200 MG tablet  Commonly known as:  ADVIL,MOTRIN      TAKE these medications       ALPRAZolam 0.25 MG  tablet  Commonly known as:  XANAX  Take 1 tablet (0.25 mg total) by mouth 2 (two) times daily as needed for anxiety or sleep.     aspirin 81 MG tablet  Take 1 tablet (81 mg total) by mouth daily.     diltiazem 360 MG 24 hr capsule  Commonly known as:  CARDIZEM CD  Take 1 capsule (360 mg total) by mouth daily.     furosemide 40 MG tablet  Commonly known as:  LASIX  Take 1 tablet (40 mg total) by mouth 2 (two) times daily.     HYDROcodone-acetaminophen 5-325 MG per tablet  Commonly known as:  NORCO/VICODIN  Take 1-2 tablets by mouth every 4 (four) hours as needed.     lisinopril 10 MG tablet  Commonly known as:  PRINIVIL,ZESTRIL  Take 1 tablet (10 mg total) by mouth daily.     Potassium Chloride ER 20 MEQ Tbcr  Take 40 mEq by mouth daily.     VITAMIN B 12 PO  Take 1 tablet by mouth daily.       No Known Allergies     Follow-up Information   Schedule an appointment as soon as possible for a visit in 1 week to follow up.   Contact information:   [facility]  [phone] Appointment May 28, 2013 at  9:00      Follow up with No PCP Per Patient.   Contact information:   [address] [place] [address] [phone]        The results of significant diagnostics from this hospitalization (including imaging, microbiology, ancillary and laboratory) are listed below for reference.    Significant Diagnostic Studies: Dg Chest Port 1 View  05/18/2013   *RADIOLOGY REPORT*  Clinical Data: Chest pain and shortness of breath.  PORTABLE CHEST - 1 VIEW  Comparison: Chest x-ray 02/17/2013.  Findings: Lung volumes are low.  The film is under penetrated, which limits the diagnostic sensitivity and specificity of this examination.  With this limitation in mind, there appears to be opacification of the base of the left hemithorax which may represent atelectasis and/or consolidation.  Probable trace bilateral pleural effusions.  There is cephalization of the pulmonary vasculature  and slight indistinctness of the interstitial markings suggestive of mild pulmonary edema.  Mild cardiomegaly. The patient is rotated to the left on today's exam, resulting in distortion of the mediastinal contours and reduced diagnostic sensitivity and specificity for mediastinal pathology.  IMPRESSION: 1.  Findings are concerning for mild congestive heart failure with trace bilateral pleural effusions. 2.  Possible area of atelectasis and/or consolidation in the left lower lobe.   Original Report Authenticated By: Trudie Reed, M.D.    Microbiology: Recent Results (from the past 240 hour(s))  MRSA PCR SCREENING     Status: None   Collection Time    05/18/13  3:25 PM      Result Value Range Status   MRSA by PCR NEGATIVE  NEGATIVE Final   Comment:            The GeneXpert MRSA Assay (FDA     approved for NASAL specimens     only), is one component of a     comprehensive MRSA colonization     surveillance program. It is not     intended to diagnose MRSA     infection nor to guide or     monitor treatment for     MRSA infections.     Labs: Basic Metabolic Panel:  Recent Labs Lab 05/18/13 1023 05/19/13 0538 05/20/13 0447 05/21/13 0426  NA 141 142 143 141  K 4.0 3.3* 3.2* 3.2*  CL 103 99 100 96  CO2 32 31 36* 37*  GLUCOSE 105* 118* 130* 124*  BUN 14 16 16 22   CREATININE 1.25 1.32 1.42* 1.66*  CALCIUM 9.2 9.2 9.2 9.5       CBC:  Recent Labs Lab 05/18/13 1023 05/19/13 0538 05/20/13 0447  WBC 7.2 7.3 7.3  NEUTROABS 5.3  --   --   HGB 14.7 14.5 13.7  HCT 45.0 45.9 42.5  MCV 83.2 84.7 83.7  PLT 206 242 226   Cardiac Enzymes:  Recent Labs Lab 05/18/13 1532 05/18/13 2222 05/20/13 0841 05/20/13 1446 05/20/13 2001  TROPONINI <0.30 0.35* <0.30 <0.30 <0.30   BNP: BNP (last 3 results)  Recent Labs  01/19/13 0500 02/17/13 0945 05/18/13 1023  PROBNP 242.1* 1249.0* 1443.0*         Signed:  GOSRANI,NIMISH C  [name] 05/21/2013, 7:42  AM

## 2013-05-21 NOTE — Progress Notes (Signed)
Pt discharged to home. Instructions given regarding follow-up appointments, medications, diet and activity. IV removed, site dry, clean and intact, no signs of infection. Pt expressed understanding of discharge instructions.

## 2013-05-25 ENCOUNTER — Encounter (HOSPITAL_COMMUNITY): Payer: Self-pay | Admitting: Emergency Medicine

## 2013-05-25 ENCOUNTER — Emergency Department (HOSPITAL_COMMUNITY): Payer: Medicare Other

## 2013-05-25 ENCOUNTER — Inpatient Hospital Stay (HOSPITAL_COMMUNITY)
Admission: EM | Admit: 2013-05-25 | Discharge: 2013-05-27 | DRG: 308 | Disposition: A | Discharge status: Home Health | Payer: Medicare Other | Attending: Internal Medicine | Admitting: Internal Medicine

## 2013-05-25 ENCOUNTER — Inpatient Hospital Stay (HOSPITAL_COMMUNITY): Payer: Medicare Other

## 2013-05-25 DIAGNOSIS — Z6841 Body Mass Index (BMI) 40.0 and over, adult: Secondary | ICD-10-CM

## 2013-05-25 DIAGNOSIS — Z9119 Patient's noncompliance with other medical treatment and regimen: Secondary | ICD-10-CM

## 2013-05-25 DIAGNOSIS — F329 Major depressive disorder, single episode, unspecified: Secondary | ICD-10-CM | POA: Diagnosis present

## 2013-05-25 DIAGNOSIS — I5033 Acute on chronic diastolic (congestive) heart failure: Secondary | ICD-10-CM | POA: Diagnosis present

## 2013-05-25 DIAGNOSIS — I5031 Acute diastolic (congestive) heart failure: Secondary | ICD-10-CM

## 2013-05-25 DIAGNOSIS — Z91199 Patient's noncompliance with other medical treatment and regimen due to unspecified reason: Secondary | ICD-10-CM

## 2013-05-25 DIAGNOSIS — I129 Hypertensive chronic kidney disease with stage 1 through stage 4 chronic kidney disease, or unspecified chronic kidney disease: Secondary | ICD-10-CM | POA: Diagnosis present

## 2013-05-25 DIAGNOSIS — F259 Schizoaffective disorder, unspecified: Secondary | ICD-10-CM | POA: Diagnosis present

## 2013-05-25 DIAGNOSIS — I4891 Unspecified atrial fibrillation: Principal | ICD-10-CM | POA: Diagnosis present

## 2013-05-25 DIAGNOSIS — E669 Obesity, unspecified: Secondary | ICD-10-CM | POA: Diagnosis present

## 2013-05-25 DIAGNOSIS — N183 Chronic kidney disease, stage 3 unspecified: Secondary | ICD-10-CM

## 2013-05-25 DIAGNOSIS — I509 Heart failure, unspecified: Secondary | ICD-10-CM | POA: Diagnosis present

## 2013-05-25 DIAGNOSIS — M109 Gout, unspecified: Secondary | ICD-10-CM

## 2013-05-25 DIAGNOSIS — Z79899 Other long term (current) drug therapy: Secondary | ICD-10-CM

## 2013-05-25 DIAGNOSIS — E876 Hypokalemia: Secondary | ICD-10-CM | POA: Diagnosis present

## 2013-05-25 DIAGNOSIS — E662 Morbid (severe) obesity with alveolar hypoventilation: Secondary | ICD-10-CM | POA: Diagnosis present

## 2013-05-25 DIAGNOSIS — R0682 Tachypnea, not elsewhere classified: Secondary | ICD-10-CM

## 2013-05-25 DIAGNOSIS — G4733 Obstructive sleep apnea (adult) (pediatric): Secondary | ICD-10-CM | POA: Diagnosis present

## 2013-05-25 DIAGNOSIS — I10 Essential (primary) hypertension: Secondary | ICD-10-CM

## 2013-05-25 DIAGNOSIS — R600 Localized edema: Secondary | ICD-10-CM | POA: Diagnosis present

## 2013-05-25 DIAGNOSIS — F3289 Other specified depressive episodes: Secondary | ICD-10-CM | POA: Diagnosis present

## 2013-05-25 LAB — POCT I-STAT, CHEM 8
Calcium, Ion: 1.13 mmol/L (ref 1.13–1.30)
Glucose, Bld: 119 mg/dL — ABNORMAL HIGH (ref 70–99)
HCT: 46 % (ref 39.0–52.0)
Hemoglobin: 15.6 g/dL (ref 13.0–17.0)
Potassium: 3 mEq/L — ABNORMAL LOW (ref 3.5–5.1)

## 2013-05-25 LAB — CBC
Hemoglobin: 15.5 g/dL (ref 13.0–17.0)
MCH: 26.8 pg (ref 26.0–34.0)
MCHC: 33 g/dL (ref 30.0–36.0)
RDW: 14.3 % (ref 11.5–15.5)

## 2013-05-25 LAB — CREATININE, SERUM
Creatinine, Ser: 1.39 mg/dL — ABNORMAL HIGH (ref 0.50–1.35)
GFR calc non Af Amer: 53 mL/min — ABNORMAL LOW (ref >90)

## 2013-05-25 LAB — POCT I-STAT TROPONIN I

## 2013-05-25 MED ORDER — SODIUM CHLORIDE 0.9 % IV SOLN: 250.0000 mL | INTRAVENOUS | Status: DC | PRN

## 2013-05-25 MED ORDER — POTASSIUM CHLORIDE CRYS ER 10 MEQ PO TBCR
40.0000 meq | EXTENDED_RELEASE_TABLET | Freq: Two times a day (BID) | ORAL | Status: AC
  Administered 2013-05-25 – 2013-05-26 (×4): 40 meq via ORAL
  Filled 2013-05-25 (×4): qty 4

## 2013-05-25 MED ORDER — DILTIAZEM HCL 30 MG PO TABS
120.0000 mg | ORAL_TABLET | Freq: Once | ORAL | Status: AC
  Administered 2013-05-25: 120 mg via ORAL
  Filled 2013-05-25: qty 4

## 2013-05-25 MED ORDER — DILTIAZEM HCL ER COATED BEADS 180 MG PO CP24
360.0000 mg | ORAL_CAPSULE | Freq: Every day | ORAL | Status: DC
  Administered 2013-05-25 – 2013-05-27 (×3): 360 mg via ORAL
  Filled 2013-05-25: qty 2
  Filled 2013-05-25 (×2): qty 1

## 2013-05-25 MED ORDER — ONDANSETRON HCL 4 MG/2ML IJ SOLN: 4.0000 mg | Freq: Four times a day (QID) | INTRAMUSCULAR | Status: DC | PRN

## 2013-05-25 MED ORDER — LISINOPRIL 10 MG PO TABS
10.0000 mg | ORAL_TABLET | Freq: Every day | ORAL | Status: DC
  Administered 2013-05-25 – 2013-05-27 (×3): 10 mg via ORAL
  Filled 2013-05-25 (×3): qty 1

## 2013-05-25 MED ORDER — ASPIRIN EC 81 MG PO TBEC
81.0000 mg | DELAYED_RELEASE_TABLET | Freq: Every day | ORAL | Status: DC
  Administered 2013-05-25 – 2013-05-27 (×3): 81 mg via ORAL
  Filled 2013-05-25 (×3): qty 1

## 2013-05-25 MED ORDER — SODIUM CHLORIDE 0.9 % IJ SOLN
3.0000 mL | INTRAMUSCULAR | Status: DC | PRN
  Administered 2013-05-26 (×2): 3 mL via INTRAVENOUS

## 2013-05-25 MED ORDER — PREDNISONE 20 MG PO TABS
40.0000 mg | ORAL_TABLET | Freq: Every day | ORAL | Status: DC
  Administered 2013-05-25 – 2013-05-27 (×3): 40 mg via ORAL
  Filled 2013-05-25 (×3): qty 2

## 2013-05-25 MED ORDER — POTASSIUM CHLORIDE CRYS ER 20 MEQ PO TBCR
40.0000 meq | EXTENDED_RELEASE_TABLET | Freq: Once | ORAL | Status: AC
  Administered 2013-05-25: 40 meq via ORAL
  Filled 2013-05-25: qty 2

## 2013-05-25 MED ORDER — ENOXAPARIN SODIUM 40 MG/0.4ML ~~LOC~~ SOLN
40.0000 mg | SUBCUTANEOUS | Status: DC
  Administered 2013-05-25 – 2013-05-27 (×3): 40 mg via SUBCUTANEOUS
  Filled 2013-05-25 (×4): qty 0.4

## 2013-05-25 MED ORDER — ALPRAZOLAM 0.25 MG PO TABS
0.2500 mg | ORAL_TABLET | Freq: Two times a day (BID) | ORAL | Status: DC | PRN
  Filled 2013-05-25: qty 1

## 2013-05-25 MED ORDER — SODIUM CHLORIDE 0.9 % IJ SOLN
3.0000 mL | Freq: Two times a day (BID) | INTRAMUSCULAR | Status: DC
  Administered 2013-05-25 – 2013-05-27 (×5): 3 mL via INTRAVENOUS

## 2013-05-25 MED ORDER — FUROSEMIDE 10 MG/ML IJ SOLN
40.0000 mg | Freq: Two times a day (BID) | INTRAMUSCULAR | Status: DC
  Administered 2013-05-25 – 2013-05-27 (×4): 40 mg via INTRAVENOUS
  Filled 2013-05-25 (×4): qty 4

## 2013-05-25 MED ORDER — FUROSEMIDE 10 MG/ML IJ SOLN
80.0000 mg | Freq: Once | INTRAMUSCULAR | Status: AC
  Administered 2013-05-25: 80 mg via INTRAMUSCULAR
  Filled 2013-05-25: qty 8

## 2013-05-25 MED ORDER — ACETAMINOPHEN 325 MG PO TABS: 650.0000 mg | ORAL_TABLET | ORAL | Status: DC | PRN

## 2013-05-25 NOTE — ED Notes (Addendum)
Pt sitting on side of bed for comfort. Pt states he suddenly feels tired. Monitor showed run of vtach with hr of 161. Pt lyed down in bed and hr back to sinus tach around 113. Strip obtained edp aware. nad at this time.

## 2013-05-25 NOTE — H&P (Signed)
History and Physical  Todd Page:[id number] DOB: 1951/07/28 DOA: 05/25/2013  Referring physician: Dr. Rosalia Hammers PCP: No PCP Per Patient   Chief Complaint: Left foot pain  HPI:  62 year old man with history of noncompliance, atrial fibrillation who presented to the emergency department with increasing left foot pain. Initial evaluation was notable for atrial fibrillation with rapid ventricular response, tachypnea and left lateral malleolar pain.  The patient is a poor historian despite patient questioning. He reports that he has had increasing pain in his left foot since he was discharged from the hospital several days ago. At this point he has difficulty ambulating secondary to pain when bearing weight. He denies injury. He does have a history of gout but it is not clear in what joints. He reports increasing swelling of both his lower extremities. He has had somewhat increased shortness of breath. No chest pain or other symptoms. He has been hospitalized 4 times in the last 6 months, more so recently 1 week ago for acute on chronic diastolic congestive heart failure and atrial fibrillation with rapid ventricular response. A common team noted in his previous hospitalizations is noncompliance. The patient has not taken any of his medications including Lasix. When asked, he presentedthe emergency department physician with all the paper prescriptions he was given on his last discharge.  In the emergency department noted to be afebrile. Respiratory rate 24-40. Normotensive. No hypoxia. Heart rate 60s-100s+, developed atrial fibrillation with rapid ventricular response.  Review of Systems:  Negative for fever, visual changes, sore throat, chest pain, dysuria, bleeding, nausea, vomiting, abdominal pain.  Past Medical History  Diagnosis Date  . Hypertension     minimal coronary atherosclerosis by CT  . Schizoaffective disorder     With depression  . Atrial fibrillation 06/2012    onset 06/2012; normal TSH;  EF-45%  . Overweight(278.02)   . Gout   . Noncompliance 09/21/2012  . Cholelithiasis     incidental finding on CT in 2013  . Systolic dysfunction     Past Surgical History  Procedure Laterality Date  . Colonoscopy  2006    normal screening examination  . Cardiac surgery      ? history accurate; no apparent incisions    Social History:  reports that he has never smoked. He does not have any smokeless tobacco history on file. He reports that he does not drink alcohol or use illicit drugs.  No Known Allergies  Family History  Problem Relation Age of Onset  . Hypertension Mother   . Liver disease Father      Prior to Admission medications   Medication Sig Start Date End Date Taking? Authorizing Provider  ALPRAZolam (XANAX) 0.25 MG tablet Take 1 tablet (0.25 mg total) by mouth 2 (two) times daily as needed for anxiety or sleep. 05/21/13   Nimish Normajean Glasgow, MD  aspirin 81 MG tablet Take 1 tablet (81 mg total) by mouth daily. 01/21/13   Erick Blinks, MD  Cyanocobalamin (VITAMIN B 12 PO) Take 1 tablet by mouth daily.    Historical Provider, MD  diltiazem (CARDIZEM CD) 360 MG 24 hr capsule Take 1 capsule (360 mg total) by mouth daily. 05/21/13   Nimish Normajean Glasgow, MD  furosemide (LASIX) 40 MG tablet Take 1 tablet (40 mg total) by mouth 2 (two) times daily. 05/21/13   Nimish Normajean Glasgow, MD  HYDROcodone-acetaminophen (NORCO/VICODIN) 5-325 MG per tablet Take 1-2 tablets by mouth every 4 (four) hours as needed. 05/21/13   Nimish Normajean Glasgow,  MD  lisinopril (PRINIVIL,ZESTRIL) 10 MG tablet Take 1 tablet (10 mg total) by mouth daily. 05/21/13   Nimish Normajean Glasgow, MD  potassium chloride 20 MEQ TBCR Take 40 mEq by mouth daily. 05/21/13   Wilson Singer, MD   Physical Exam: Filed Vitals:   05/25/13 0840 05/25/13 1029  BP: 174/120 137/87  Pulse: 66 110  Temp: 98.7 F (37.1 C)   TempSrc: Oral   Resp: 24 40  SpO2: 96% 92%   General: Examined in the emergency department. Calm, mildly  uncomfortable. Appears ill with some respiratory distress but nontoxic.  Eyes: PERRL, normal lids, irises  ENT: grossly normal hearing, lips  Neck: no LAD, masses or thyromegaly Cardiovascular: Tachycardic, regular. No murmur, rub, gallop. 3+ bilateral lower extremity edema. Respiratory: Decreased breath sounds bilaterally. Fair air movement. No wheezes, rales or rhonchi. Moderate increased respiratory effort. Able to speak in full sentences. Abdomen: soft, ntnd. Obese. Skin: Bilateral lower extremity venous stasis changes and erythema. Multiple punctate lesions over the arms and legs with the appearance of "bug bites". Musculoskeletal: Grossly normal tone and strength upper and lower extremities bilaterally. The left foot appears edematous and erythematous, very similar to the right foot. There is no pain with palpation of the right foot, ankle or toes. No pain with palpation of the left foot or toes. Exquisite tenderness left lateral malleolus. Range of motion appears normal. Perfusion appears normal the foot. Psychiatric: Difficult to characterize mood. Affect is odd. Neurologic: grossly non-focal.  Wt Readings from Last 3 Encounters:  05/20/13 146.1 kg (322 lb 1.5 oz)  02/20/13 140.978 kg (310 lb 12.8 oz)  01/21/13 135.4 kg (298 lb 8.1 oz)    Labs on Admission:  Basic Metabolic Panel:  Recent Labs Lab 05/19/13 0538 05/20/13 0447 05/21/13 0426 05/25/13 0928  NA 142 143 141 140  K 3.3* 3.2* 3.2* 3.0*  CL 99 100 96 102  CO2 31 36* 37*  --   GLUCOSE 118* 130* 124* 119*  BUN 16 16 22 19   CREATININE 1.32 1.42* 1.66* 1.40*  CALCIUM 9.2 9.2 9.5  --    CBC:  Recent Labs Lab 05/19/13 0538 05/20/13 0447 05/25/13 0928  WBC 7.3 7.3  --   HGB 14.5 13.7 15.6  HCT 45.9 42.5 46.0  MCV 84.7 83.7  --   PLT 242 226  --     Cardiac Enzymes:  Recent Labs Lab 05/18/13 1532 05/18/13 2222 05/20/13 0841 05/20/13 1446 05/20/13 2001  TROPONINI <0.30 0.35* <0.30 <0.30 <0.30     Recent Labs  01/19/13 0500 02/17/13 0945 05/18/13 1023  PROBNP 242.1* 1249.0* 1443.0*   Radiological Exams on Admission: Dg Chest 2 View  05/25/2013   *RADIOLOGY REPORT*  Clinical Data: Shortness of breath  CHEST - 2 VIEW  Comparison: 05/18/2013  Findings: Lung volumes are low with crowding of the bronchovascular markings.  Curvilinear bilateral lower lobe presumed atelectasis noted.  No pleural effusion.  Degenerative changes are noted in the spine without acute osseous abnormality. Moderate enlargement of the cardiac silhouette is re-identified with central vascular congestion.  IMPRESSION: Low lung volumes  with presumed bibasilar atelectasis or scarring.   Original Report Authenticated By: Christiana Pellant, M.D.   US Venous Img Lower Bilateral  05/25/2013   *RADIOLOGY REPORT*  Clinical Data: Bilateral leg tingling  VENOUS DUPLEX ULTRASOUND OF BILATERAL LOWER EXTREMITIES  Technique:  Gray-scale sonography with graded compression, as well as color Doppler and duplex ultrasound, were performed to evaluate the deep venous system of both  lower extremities from the level of the common femoral vein through the popliteal and proximal calf veins.  Spectral Doppler was utilized to evaluate flow at rest and with distal augmentation maneuvers.  Comparison:  None.  Findings: Ultrasound examination bilateral lower extremity demonstrate the visualized deep veins to be patent with good augmentation and compressibility noted.  There is no evidence of deep vein thrombosis.  No evidence of superficial vein thrombosis. Subcutaneous edema noted bilateral calfs.  IMPRESSION: No evidence of deep vein thrombosis bilateral lower extremity.   Original Report Authenticated By: Natasha Mead, M.D.    EKG: Independently reviewed. Atrial fibrillation with rapid ventricular response   Principal Problem:   Atrial fibrillation with rapid ventricular response Active Problems:   Schizoaffective disorder   Noncompliance    Lower extremity edema   Tachypnea   Acute diastolic congestive heart failure   Chronic kidney disease, stage III (moderate)   Acute gout   Assessment/Plan 1. Atrial fibrillation with rapid ventricular response: Rate improved with oral diltiazem. Continue oral diltiazem. Secondary to noncompliance. No chest pain. Monitor clinically. Not a candidate for anticoagulation secondary to noncompliance. 2. Tachypnea without definite acute respiratory failure: Suspect multifactorial, element of diastolic dysfunction although x-ray is clear. Probably complicated by obesity hypoventilation syndrome. No chest pain. Given noncompliance of volume overload think these are more likely explanations, VTE doubted. 3. Suspected acute on chronic diastolic congestive heart failure: IV Lasix. 4. Massive bilateral lower extremity edema: likely secondary to chronic diastolic heart failure and noncompliance with Lasix. Dopplers negative for DVT. 5. Left foot pain, suspected gout: Bilateral lower extremity erythema appears symmetric. Do not suspect cellulitis. Will avoid NSAIDs given chronic kidney disease and heart failure. Will treat with steroids. Check left ankle x-ray. 6. Hypokalemia: Replete. 7. Chronic kidney disease stage III: Appears stable. 8. History of gout: Suspect acute flare. Treat as above. 9. Schizoaffective disorder: Appears stable at this point. 10. Obstructive sleep apnea: Noncompliant. Refuses CPAP. 11. Chronic noncompliance: Reinvolve case management and social work. Consider assisted living.  Respiratory status somewhat tenuous. Admit to step down. Likely to improve with diuresis and rate control.  Code Status: Full code  DVT prophylaxis: Lovenox Family Communication: None present Disposition Plan/Anticipated LOS:  admission. 2-4 days.  Time spent: 60 minutes  Brendia Sacks, MD  [name] Pager [phone] 05/25/2013, 11:31 AM

## 2013-05-25 NOTE — ED Notes (Signed)
Floor unable to take report at this time. Pt in nad. Resting well.

## 2013-05-25 NOTE — ED Notes (Addendum)
Pt c/o left foot tingling upon standing x 3 days. Nad. Neuro check negative. Dry cough noted.

## 2013-05-25 NOTE — ED Notes (Signed)
Pt had urinary waste that did not hit urinal. Bed changed. Pt cleaned

## 2013-05-25 NOTE — ED Provider Notes (Signed)
CSN: [id number]     Arrival date & time 05/25/13  0845 History    This chart was scribed for Hilario Quarry, MD, by Yevette Edwards, ED Scribe. This patient was seen in room APA19/APA19 and the patient's care was started at 8:48 AM.   First MD Initiated Contact with Patient 05/25/13 0847     Chief Complaint  Patient presents with  . Tingling    The history is provided by the patient. No language interpreter was used.   HPI Comments: Todd Page is a 62 y.o. male, arriving via ambulance, who presents to the Emergency Department complaining of gradual-onset swelling to his left foot which began approximately a week ago when he was a patient at [facility] for an episode of SOB. The pt states that the foot is painful to stand upon and the pain keeps him from ambulating properly. He states that he first began having trouble walking on his foot several days ago, but the pain to his left foot became severe this morning. He denies any prior injury to the foot.  He denies a h/o of blood clots, CHF, kidney problems, and DM.   He also denies any allergies to medications. The pt states he has not been taking his medications as prescribed to him on May 21, 2013 which were lasix, potassium, hydrocodone, xanax, and Vicodin. He is not on oxygen at home. The pt has a h/o of HTN, schizoaffective disorder, and atrial fibrillation. He denies smoking, and he denies alcohol usage.  The pt lives with his mother. 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  He does not have a PCP.  Past Medical History  Diagnosis Date  . Hypertension     minimal coronary atherosclerosis by CT  . Schizoaffective disorder     With depression  . Atrial fibrillation 06/2012    onset 06/2012; normal TSH; EF-45%  . Overweight(278.02)   . Gout   . Noncompliance 09/21/2012  . Cholelithiasis     incidental finding on CT in 2013  . Systolic dysfunction    Past Surgical History  Procedure Laterality  Date  . Colonoscopy  2006    normal screening examination  . Cardiac surgery      ? history accurate; no apparent incisions   Family History  Problem Relation Age of Onset  . Hypertension Mother   . Liver disease Father    History  Substance Use Topics  . Smoking status: Never Smoker   . Smokeless tobacco: Not on file  . Alcohol Use: Yes     Comment: former     Review of Systems  Constitutional: Negative for appetite change.  Respiratory: Negative for shortness of breath.   Cardiovascular: Negative for chest pain.  Gastrointestinal: Negative for abdominal pain.  Musculoskeletal: Positive for myalgias (Left foot.).  All other systems reviewed and are negative.    Allergies  Review of patient's allergies indicates no known allergies.  Home Medications   Current Outpatient Rx  Name  Route  Sig  Dispense  Refill  . ALPRAZolam (XANAX) 0.25 MG tablet   Oral   Take 1 tablet (0.25 mg total) by mouth 2 (two) times daily as needed for anxiety or sleep.   30 tablet   0   . aspirin 81 MG tablet   Oral   Take 1 tablet (81 mg total) by mouth daily.   30 tablet   1   . Cyanocobalamin (VITAMIN  B 12 PO)   Oral   Take 1 tablet by mouth daily.         Marland Kitchen diltiazem (CARDIZEM CD) 360 MG 24 hr capsule   Oral   Take 1 capsule (360 mg total) by mouth daily.   30 capsule   0   . furosemide (LASIX) 40 MG tablet   Oral   Take 1 tablet (40 mg total) by mouth 2 (two) times daily.   60 tablet   0   . HYDROcodone-acetaminophen (NORCO/VICODIN) 5-325 MG per tablet   Oral   Take 1-2 tablets by mouth every 4 (four) hours as needed.   30 tablet   0   . lisinopril (PRINIVIL,ZESTRIL) 10 MG tablet   Oral   Take 1 tablet (10 mg total) by mouth daily.   30 tablet   0   . potassium chloride 20 MEQ TBCR   Oral   Take 40 mEq by mouth daily.   60 tablet   0    Triage Vitals: BP 174/120  Pulse 66  Temp(Src) 98.7 F (37.1 C) (Oral)  Resp 24  SpO2 96%  Physical Exam   Nursing note and vitals reviewed. Constitutional: He is oriented to person, place, and time. He appears well-developed and well-nourished. No distress.  HENT:  Head: Normocephalic and atraumatic.  Right Ear: External ear normal.  Left Ear: External ear normal.  Mouth/Throat: Oropharynx is clear and moist.  Lips are a little dry.   Eyes: EOM are normal. Pupils are equal, round, and reactive to light.  Neck: Neck supple. No tracheal deviation present.  Cardiovascular: Normal rate.   Pulmonary/Chest: Effort normal. No respiratory distress.  Tachypneic.   Abdominal: Soft. There is no tenderness.  Obese.   Musculoskeletal: Normal range of motion. He exhibits edema.  Bilateral edema on lower extremities.  Chronic venous-statis changes. Bileral erythema of lower extremities, with erythema extending more proximally on left than right.   Neurological: He is alert and oriented to person, place, and time.  Skin: Skin is warm and dry.  Psychiatric: He has a normal mood and affect. His behavior is normal.    ED Course   Medications  furosemide (LASIX) injection 80 mg (not administered)  diltiazem (CARDIZEM) tablet 120 mg (120 mg Oral Given 05/25/13 0921)    DIAGNOSTIC STUDIES: Oxygen Saturation is 96% on room air, adequate by my interpretation.    COORDINATION OF CARE:  8:59 AM-Discussed treatment plan with patient, and the patient agreed to the plan.   Procedures (including critical care time)  Labs Reviewed  POCT I-STAT, CHEM 8 - Abnormal; Notable for the following:    Potassium 3.0 (*)    Creatinine, Ser 1.40 (*)    Glucose, Bld 119 (*)    All other components within normal limits   Dg Chest 2 View  05/25/2013   *RADIOLOGY REPORT*  Clinical Data: Shortness of breath  CHEST - 2 VIEW  Comparison: 05/18/2013  Findings: Lung volumes are low with crowding of the bronchovascular markings.  Curvilinear bilateral lower lobe presumed atelectasis noted.  No pleural effusion.   Degenerative changes are noted in the spine without acute osseous abnormality. Moderate enlargement of the cardiac silhouette is re-identified with central vascular congestion.  IMPRESSION: Low lung volumes  with presumed bibasilar atelectasis or scarring.   Original Report Authenticated By: Christiana Pellant, M.D.   US Venous Img Lower Bilateral  05/25/2013   *RADIOLOGY REPORT*  Clinical Data: Bilateral leg tingling  VENOUS DUPLEX ULTRASOUND OF BILATERAL  LOWER EXTREMITIES  Technique:  Gray-scale sonography with graded compression, as well as color Doppler and duplex ultrasound, were performed to evaluate the deep venous system of both lower extremities from the level of the common femoral vein through the popliteal and proximal calf veins.  Spectral Doppler was utilized to evaluate flow at rest and with distal augmentation maneuvers.  Comparison:  None.  Findings: Ultrasound examination bilateral lower extremity demonstrate the visualized deep veins to be patent with good augmentation and compressibility noted.  There is no evidence of deep vein thrombosis.  No evidence of superficial vein thrombosis. Subcutaneous edema noted bilateral calfs.  IMPRESSION: No evidence of deep vein thrombosis bilateral lower extremity.   Original Report Authenticated By: Natasha Mead, M.D.   No diagnosis found.  Date: 05/25/2013  Rate: 126  Rhythm: atrial fibrillation  QRS Axis: normal  Intervals: normal  ST/T Wave abnormalities: nonspecific ST changes  Conduction Disutrbances:none  Narrative Interpretation:   Old EKG Reviewed: unchanged from 18 May 2013    MDM  Patient with run of tachycardia on monitor- unable to evaluate if v tach vs bigeminy on strip.  Appears back in a fib at rate of 103.  Respirations at 40.   1- foot pain- patient with bilateral chronic venous stasis and skin changes,  Doppler negative for dvt, some increasd warmth and redness of lle vs rle. 2- a fib- rate not controlled- patient not taking  discharge meds.  Patient hemodynamically stable with bp normal to elevated and initially opted for po dosing, but patient may require bridging iv dose as he has had some desaturation and remains very tachypneic.  3- noncompliance with medication  Discussed with Dr. Irene Limbo and he will admit to step down.    I personally performed the services described in this documentation, which was scribed in my presence. The recorded information has been reviewed and considered. Hilario Quarry, MD 05/25/13 [phone]

## 2013-05-26 LAB — BASIC METABOLIC PANEL
CO2: 31 mEq/L (ref 19–32)
Calcium: 9.5 mg/dL (ref 8.4–10.5)
Chloride: 100 mEq/L (ref 96–112)
Creatinine, Ser: 1.39 mg/dL — ABNORMAL HIGH (ref 0.50–1.35)
Glucose, Bld: 134 mg/dL — ABNORMAL HIGH (ref 70–99)

## 2013-05-26 MED ORDER — TUBERCULIN PPD 5 UNIT/0.1ML ID SOLN
5.0000 [IU] | Freq: Once | INTRADERMAL | Status: DC
  Administered 2013-05-26: 5 [IU] via INTRADERMAL
  Filled 2013-05-26: qty 0.1

## 2013-05-26 NOTE — Progress Notes (Signed)
PT TRANSFERING TO ROOM 318. PT ALERT AND ORIENTED. O2 CONTINUES AT 2L/MIN VIA [place].PT IS NOT HAVING ANY SOB TODAY. EDEMA AND REDNESS OF BOTH LOWER EXTREMITIES HAS DECREASED.LT UPPER ARM NSL IS PATENT. TRANSFER REPORT CALLED TO JESSICA RN ON 300.

## 2013-05-26 NOTE — Progress Notes (Signed)
UR chart review completed.  

## 2013-05-26 NOTE — Progress Notes (Signed)
Patient seen, independently examined and chart reviewed. I agree with exam, assessment and plan discussed with Toya Smothers, NP.  No issues overnight. Overall he feels better, breathing better, less ankle pain. He can now put weight on his left foot. On exam he appears clearly better today with resolution of tachypnea. Heart rate remains controlled, atrial fibrillation on telemetry. He sitting on the side of the bed and has eaten breakfast without difficulty. His lungs sound clear. The left ankle is now nontender to palpation. Bilateral lower extremity venous stasis changes seen without change.  He is clearly improving. Will transfer to medical floor with telemetry, continue IV diuresis and empiric prednisone for suspected gout. Will [facility] social work and case management--patient would benefit from assisted living given his recurrent noncompliance. He will likely be ready for discharge next 48 hours.  Brendia Sacks, MD [name] [phone]

## 2013-05-26 NOTE — Progress Notes (Signed)
[name] PROGRESS NOTE  Todd Page:[id number] DOB: 1951-04-30 DOA: 05/25/2013 PCP: No PCP Per Patient  Assessment/Plan: 1. Atrial fibrillation with rapid ventricular response: Rate controlled. Continue oral diltiazem. Secondary to noncompliance. No chest pain. Not a candidate for anticoagulation secondary to noncompliance. 2. Tachypnea without definite acute respiratory failure: Suspect multifactorial, element of diastolic dysfunction although x-ray is clear. Probably complicated by obesity hypoventilation syndrome. No chest pain. Given noncompliance of volume overload think these are more likely explanations, VTE doubted. Resolved.  3. Suspected acute on chronic diastolic congestive heart failure: IV Lasix. Volume status -1.3L. Weight 314lbs down from 316lbs on admission. Transition po lasix.  4. Massive bilateral lower extremity edema: likely secondary to chronic diastolic heart failure and noncompliance with Lasix. Dopplers negative for DVT. Much improved this am. Close to baseline.  5. Left foot pain, suspected gout: Bilateral lower extremity erythema appears symmetric. Do not suspect cellulitis. Will avoid NSAIDs given chronic kidney disease and heart failure. Left ankle x-ray.Soft tissue swelling surrounding the ankle joint, without underlying acute bony abnormality. Continue steroids 6. Hypokalemia: related to diuresis. Replete and recheck.  7. Chronic kidney disease stage III: Remains stable. 8. History of gout: Suspect acute flare. Treat as above. 9. Schizoaffective disorder: remains stable at this point. 10. Obstructive sleep apnea: Noncompliant. Refuses CPAP. 11. Chronic noncompliance: Reinvolve case management and social work. Consider assisted living.  Code Status: full Family Communication: non present Disposition Plan: transfer to telemetry. Hopefully home 24-48 hours   Consultants:  none  Procedures:  none  Antibiotics:  none  HPI/Subjective: Sitting on  side of bed eating. Reports less pain in left ankle. No sob  Objective: Filed Vitals:   05/26/13 0400 05/26/13 0500 05/26/13 0600 05/26/13 0749  BP: 178/134 148/103 129/84   Pulse: 80 69 121   Temp: 97.4 F (36.3 C)   97.6 F (36.4 C)  TempSrc: Oral   Oral  Resp: 25 32 21   Height:      Weight:  314 lb 2.5 oz (142.5 kg)    SpO2: 95% 87% 89%     Intake/Output Summary (Last 24 hours) at 05/26/13 0825 Last data filed at 05/26/13 0300  Gross per 24 hour  Intake    700 ml  Output   2025 ml  Net  -1325 ml   Filed Weights   05/25/13 1307 05/26/13 0500  Weight: 316 lb 2.2 oz (143.4 kg) 314 lb 2.5 oz (142.5 kg)    Exam:   General:  Obese NAD  Cardiovascular: irregularly irregular. No MGR 2-3+LE edema. Venous stasis changes bilaterally  Respiratory: normal effort. BS somewhat distant but clear no wheeze no crackles  Abdomen: obese soft +BS non-tender to palpation  Musculoskeletal: bilateral erythema LE Left foot with less tenderness to palpation. PPP  Skin: multiple punctate lesions over extremities. No erythema or drainage   Data Reviewed: Basic Metabolic Panel:  Recent Labs Lab 05/20/13 0447 05/21/13 0426 05/25/13 0928 05/25/13 1323 05/26/13 0437  NA 143 141 140  --  138  K 3.2* 3.2* 3.0*  --  3.3*  CL 100 96 102  --  100  CO2 36* 37*  --   --  31  GLUCOSE 130* 124* 119*  --  134*  BUN 16 22 19   --  24*  CREATININE 1.42* 1.66* 1.40* 1.39* 1.39*  CALCIUM 9.2 9.5  --   --  9.5   Liver Function Tests: No results found for this basename: AST, ALT, ALKPHOS, BILITOT, PROT, ALBUMIN,  in the last 168 hours No results found for this basename: LIPASE, AMYLASE,  in the last 168 hours No results found for this basename: AMMONIA,  in the last 168 hours CBC:  Recent Labs Lab 05/20/13 0447 05/25/13 0928 05/25/13 1323  WBC 7.3  --  9.9  HGB 13.7 15.6 15.5  HCT 42.5 46.0 46.9  MCV 83.7  --  81.0  PLT 226  --  253   Cardiac Enzymes:  Recent Labs Lab  05/20/13 0841 05/20/13 1446 05/20/13 2001  TROPONINI <0.30 <0.30 <0.30   BNP (last 3 results)  Recent Labs  01/19/13 0500 02/17/13 0945 05/18/13 1023  PROBNP 242.1* 1249.0* 1443.0*   CBG: No results found for this basename: GLUCAP,  in the last 168 hours  Recent Results (from the past 240 hour(s))  MRSA PCR SCREENING     Status: None   Collection Time    05/18/13  3:25 PM      Result Value Range Status   MRSA by PCR NEGATIVE  NEGATIVE Final   Comment:            The GeneXpert MRSA Assay (FDA     approved for NASAL specimens     only), is one component of a     comprehensive MRSA colonization     surveillance program. It is not     intended to diagnose MRSA     infection nor to guide or     monitor treatment for     MRSA infections.     Studies: Dg Chest 2 View  05/25/2013   *RADIOLOGY REPORT*  Clinical Data: Shortness of breath  CHEST - 2 VIEW  Comparison: 05/18/2013  Findings: Lung volumes are low with crowding of the bronchovascular markings.  Curvilinear bilateral lower lobe presumed atelectasis noted.  No pleural effusion.  Degenerative changes are noted in the spine without acute osseous abnormality. Moderate enlargement of the cardiac silhouette is re-identified with central vascular congestion.  IMPRESSION: Low lung volumes  with presumed bibasilar atelectasis or scarring.   Original Report Authenticated By: Christiana Pellant, M.D.   Dg Ankle Complete Left  05/25/2013   *RADIOLOGY REPORT*  Clinical Data: Left lateral malleolus pain with palpitations. History of gout.  No history of injury.  LEFT ANKLE COMPLETE - 3+ VIEW  Comparison: Left ankle radiographs 08/05/2012.  Findings: Mild soft tissue swelling around the ankle both medially and laterally.  No acute displaced fracture, subluxation or dislocation.  No soft tissue calcifications.  No definite bony erosions.  There is an irregular area of sclerosis with central lucency in the posterior aspect of the calcaneus,  likely related to chronic degenerative changes at the insertion point of the Achilles tendon.  IMPRESSION: 1.  Soft tissue swelling surrounding the ankle joint, without underlying acute bony abnormality.   Original Report Authenticated By: Trudie Reed, M.D.   US Venous Img Lower Bilateral  05/25/2013   *RADIOLOGY REPORT*  Clinical Data: Bilateral leg tingling  VENOUS DUPLEX ULTRASOUND OF BILATERAL LOWER EXTREMITIES  Technique:  Gray-scale sonography with graded compression, as well as color Doppler and duplex ultrasound, were performed to evaluate the deep venous system of both lower extremities from the level of the common femoral vein through the popliteal and proximal calf veins.  Spectral Doppler was utilized to evaluate flow at rest and with distal augmentation maneuvers.  Comparison:  None.  Findings: Ultrasound examination bilateral lower extremity demonstrate the visualized deep veins to be patent with good augmentation and compressibility noted.  There is no evidence of deep vein thrombosis.  No evidence of superficial vein thrombosis. Subcutaneous edema noted bilateral calfs.  IMPRESSION: No evidence of deep vein thrombosis bilateral lower extremity.   Original Report Authenticated By: Natasha Mead, M.D.    Scheduled Meds: . aspirin EC  81 mg Oral Daily  . diltiazem  360 mg Oral Daily  . enoxaparin (LOVENOX) injection  40 mg Subcutaneous Q24H  . furosemide  40 mg Intravenous Q12H  . lisinopril  10 mg Oral Daily  . potassium chloride  40 mEq Oral BID  . predniSONE  40 mg Oral Q breakfast  . sodium chloride  3 mL Intravenous Q12H   Continuous Infusions:   Principal Problem:   Atrial fibrillation with rapid ventricular response Active Problems:   Schizoaffective disorder   Noncompliance   Lower extremity edema   Tachypnea   Acute diastolic congestive heart failure   Chronic kidney disease, stage III (moderate)   Acute gout    Time spent: 30 minutes    [facility] M  [facility] Pager [phone]. If 7PM-7AM, please contact night-coverage at [email or web address], password [facility] 05/26/2013, 8:25 AM  LOS: 1 day

## 2013-05-26 NOTE — Progress Notes (Signed)
TUBERCULIN SKIN TEST GIVEN IN LT MID ANTERIOR FOREARM AS ORDERED.

## 2013-05-26 NOTE — Clinical Social Work Psychosocial (Signed)
    Clinical Social Work Department BRIEF PSYCHOSOCIAL ASSESSMENT 05/26/2013  Patient:  Todd Page, Todd Page     Account Number:  [id number]     Admit date:  05/25/2013  Clinical Social Worker:  Santa Genera, CLINICAL SOCIAL WORKER  Date/Time:  05/26/2013 10:45 AM  Referred by:  Physician  Date Referred:  05/26/2013 Referred for  ALF Placement   Other Referral:   Interview type:  Patient Other interview type:    PSYCHOSOCIAL DATA Living Status:  FAMILY Admitted from facility:   Level of care:   Primary support name:  [name] Moynahan Primary support relationship to patient:  PARENT Degree of support available:   Potentially inadequate level of support at home, lives w elderly mother, conflict w brother and sister who assist w mother's care    CURRENT CONCERNS Current Concerns  Post-Acute Placement   Other Concerns:    SOCIAL WORK ASSESSMENT / PLAN CSW met w patient at bedside, patient alert and oriented x4.  Patient admits that he does not like current cycle of repeated hospitalization/readmissions.  Points to swollen legs and says "they didnt get to the bottom of it last time."  Agrees that he needs more help at home.  Lives w elderly mother who has been at SNF for rehab 2x in past year ([facility] and [facility]).  Patient frustrated that brother and sister "put mom in rehab and dont consider my feelings."  Mother receives some help from home health (weekly RN visit and PT which is now discontinued).  Per patient, mother does not have any in home assistance for any other reason (CAP aide or CNA).  Brother comes daily to lay out mothers medications, do wash "for [place]".  Patient states he is able to drive and does grocery shopping.    Patient willing to consider ALF placement but is limited by income - says he receives disability of approx $1000/month. Has not tried to access ALF Medicaid.  Agreed for CSW to investigate, spoke w B Ratliffe who will let me know if he might qualify.  Patient says that he  would like ALF, but "I cannot afford it."  Left patient list of ALFs in [facility] and will return w more information about whether he might be eligible for Medicaid.  Patient says he is willing to consider AF and understands he would have to give up most of  his disabliity check in exchange for care to be given.    CSW received report from RN that brother, Kadeen Sroka, wanted to talk to CSW w concerns about patient's ability to manage himself and medications at home.  CSW discussed w patient and he did not consent for CSW to speak w brother.  RN informed.   Assessment/plan status:  Psychosocial Support/Ongoing Assessment of Needs Other assessment/ plan:   Information/referral to community resources:   ALF list    PATIENT'S/FAMILY'S RESPONSE TO PLAN OF CARE: Patient willing to consider ALF if he can find a way to afford.  Does not want brother or sister contacted or given any information about his situation.   Santa Genera, LCSW Clinical Social Worker [phone])

## 2013-05-27 DIAGNOSIS — I10 Essential (primary) hypertension: Secondary | ICD-10-CM

## 2013-05-27 LAB — BASIC METABOLIC PANEL
BUN: 24 mg/dL — ABNORMAL HIGH (ref 6–23)
CO2: 31 mEq/L (ref 19–32)
Calcium: 9.7 mg/dL (ref 8.4–10.5)
Glucose, Bld: 111 mg/dL — ABNORMAL HIGH (ref 70–99)
Potassium: 3.4 mEq/L — ABNORMAL LOW (ref 3.5–5.1)
Sodium: 141 mEq/L (ref 135–145)

## 2013-05-27 MED ORDER — FUROSEMIDE 40 MG PO TABS: 40.0000 mg | ORAL_TABLET | Freq: Every day | ORAL | Status: DC

## 2013-05-27 MED ORDER — PREDNISONE 10 MG PO TABS: ORAL_TABLET | ORAL | Status: DC

## 2013-05-27 MED ORDER — POTASSIUM CHLORIDE CRYS ER 20 MEQ PO TBCR
40.0000 meq | EXTENDED_RELEASE_TABLET | Freq: Once | ORAL | Status: AC
  Administered 2013-05-27: 40 meq via ORAL
  Filled 2013-05-27: qty 2

## 2013-05-27 MED ORDER — FUROSEMIDE 40 MG PO TABS: 40.0000 mg | ORAL_TABLET | Freq: Two times a day (BID) | ORAL | Status: DC

## 2013-05-27 NOTE — Clinical Social Work Note (Signed)
Patient is now declining ALF placement, CSW contacted [facility] to cancel [facility] screen.  Patient has list of ALFs and has consented to B Ratliffe assisting w Medicaid application for possible ALF placement in future.    Santa Genera, LCSW Clinical Social Worker [phone])

## 2013-05-27 NOTE — Discharge Summary (Signed)
Physician Discharge Summary  Todd Page JYN:[id number] DOB: 17-Nov-1950 DOA: 05/25/2013  PCP: No PCP Per Patient  Admit date: 05/25/2013 Discharge date: 05/27/2013  Time spent: 40 minutes  Recommendations for Outpatient Follow-up:  1. Pt has appointment with PCP 05/29/13 for evaluation of symptoms  Discharge Diagnoses:  Principal Problem:   Atrial fibrillation with rapid ventricular response Active Problems:   Schizoaffective disorder   Noncompliance   Lower extremity edema   Tachypnea   Acute diastolic congestive heart failure   Chronic kidney disease, stage III (moderate)   Acute gout   Discharge Condition: stable  Diet recommendation: heart healthy  Filed Weights   05/25/13 1307 05/26/13 0500  Weight: 316 lb 2.2 oz (143.4 kg) 314 lb 2.5 oz (142.5 kg)    History of present illness:  62 year old man with history of noncompliance, atrial fibrillation who presented to the emergency department on 05/25/13 with increasing left foot pain. Initial evaluation was notable for atrial fibrillation with rapid ventricular response, tachypnea and left lateral malleolar pain. The patient is a poor historian despite patient questioning. He reported that he  had increasing pain in his left foot since he was discharged from the hospital several days prior. He reported difficulty ambulating secondary to pain when bearing weight. He denied injury. He does have a history of gout but it is not clear in what joints. He reported increasing swelling of both his lower extremities. He had somewhat increased shortness of breath. No chest pain or other symptoms. He had been hospitalized 4 times in the last 6 months, more so recently 1 week prior for acute on chronic diastolic congestive heart failure and atrial fibrillation with rapid ventricular response. A common theme noted in his previous hospitalizations is noncompliance. The patient had not taken any of his medications including Lasix. When asked, he presented  the emergency department physician with all the paper prescriptions he was given on his last discharge. In the emergency department noted to be afebrile. Respiratory rate 24-40. Normotensive. No hypoxia. Heart rate 60s-100s+, developed atrial fibrillation with rapid ventricular response.      Hospital Course:  1. Atrial fibrillation with rapid ventricular response: admitted to [facility]. Rate controlled with oral diltiazem. Secondary to noncompliance. No chest pain. Not a candidate for anticoagulation secondary to noncompliance. 2. Tachypnea without definite acute respiratory failure: Suspect multifactorial, element of diastolic dysfunction although x-ray is clear. Probably complicated by obesity hypoventilation syndrome. No chest pain. Given noncompliance of volume overload think these are more likely explanations. Given IV lasix and diuresed well. Resolved at discharge. Sats >90% on room air at discharge.  3. Suspected acute on chronic diastolic congestive heart failure: IV Lasix provided. Volume status -925L. Weight 314lbs down from 316lbs on admission. Discharge on po lasix at home dose. Will request [facility] RN for assistance in evaluation of disease management and medication.  4. Massive bilateral lower extremity edema: likely secondary to chronic diastolic heart failure and noncompliance with Lasix. Dopplers negative for DVT. Much improved at discharge. At baseline at discharge. LE with chronic venous changes.  5. Left foot pain, suspected gout: Bilateral lower extremity erythema appears symmetric. Did not suspect cellulitis. Avoided NSAIDs given chronic kidney disease and heart failure. Left ankle x-ray.Soft tissue swelling surrounding the ankle joint, without underlying acute bony abnormality. Pt started on prednisone with good relief. Able to bear weight at discharge. Will discharge with steroid taper. Recommend OP follow up with PCP for evaluation of gout. 6. Hypokalemia: related to diuresis. Repleted and  resolved.  7. Chronic kidney disease stage III: Remained stable. 8. History of gout: Suspect acute flare. Treated as above. 9. Schizoaffective disorder: remained stable at this point. 10. Obstructive sleep apnea: Noncompliant. Refuses CPAP. 11. Chronic noncompliance: Reinvolve case management and social work. Consider assisted living.     Procedures:  none  Consultations:  none  Discharge Exam: Filed Vitals:   05/26/13 1500 05/26/13 1814 05/26/13 2141 05/27/13 0620  BP:  135/96 158/108 152/102  Pulse: 99 93 114 74  Temp:  98.2 F (36.8 C) 97.8 F (36.6 C) 97.5 F (36.4 C)  TempSrc:   Oral Axillary  Resp:  18 20 20   Height:      Weight:      SpO2: 90% 93% 91% 90%    General: obese sitting in chair reading paper Cardiovascular: irregularly irregular No MGR 2+ LE edema Respiratory: normal effort BS clear bilaterally slightly diminished. No wheeze no crackles  Discharge Instructions     Medication List         ALPRAZolam 0.25 MG tablet  Commonly known as:  XANAX  Take 1 tablet (0.25 mg total) by mouth 2 (two) times daily as needed for anxiety or sleep.     aspirin 81 MG tablet  Take 1 tablet (81 mg total) by mouth daily.     diltiazem 360 MG 24 hr capsule  Commonly known as:  CARDIZEM CD  Take 1 capsule (360 mg total) by mouth daily.     furosemide 40 MG tablet  Commonly known as:  LASIX  Take 1 tablet (40 mg total) by mouth 2 (two) times daily.     HYDROcodone-acetaminophen 5-325 MG per tablet  Commonly known as:  NORCO/VICODIN  Take 1-2 tablets by mouth every 4 (four) hours as needed.     lisinopril 10 MG tablet  Commonly known as:  PRINIVIL,ZESTRIL  Take 1 tablet (10 mg total) by mouth daily.     Potassium Chloride ER 20 MEQ Tbcr  Take 40 mEq by mouth daily.     predniSONE 10 MG tablet  Commonly known as:  DELTASONE  Take 2 tabs 7/31, 8/1 and 8/2 take 1 tab 8/3, 8/4 and 8/5 then stop.     VITAMIN B 12 PO  Take 1 tablet by mouth daily.        No Known Allergies     Follow-up Information   Please follow up.   Contact information:   Duane Boston July 31, 9:00 [address], [place], [place] [phone]      Follow up with [facility].   Contact information:   [address] [place] [address] [phone]       The results of significant diagnostics from this hospitalization (including imaging, microbiology, ancillary and laboratory) are listed below for reference.    Significant Diagnostic Studies: Dg Chest 2 View  05/25/2013   *RADIOLOGY REPORT*  Clinical Data: Shortness of breath  CHEST - 2 VIEW  Comparison: 05/18/2013  Findings: Lung volumes are low with crowding of the bronchovascular markings.  Curvilinear bilateral lower lobe presumed atelectasis noted.  No pleural effusion.  Degenerative changes are noted in the spine without acute osseous abnormality. Moderate enlargement of the cardiac silhouette is re-identified with central vascular congestion.  IMPRESSION: Low lung volumes  with presumed bibasilar atelectasis or scarring.   Original Report Authenticated By: Christiana Pellant, M.D.   Dg Ankle Complete Left  05/25/2013   *RADIOLOGY REPORT*  Clinical Data: Left lateral malleolus pain with palpitations. History  of gout.  No history of injury.  LEFT ANKLE COMPLETE - 3+ VIEW  Comparison: Left ankle radiographs 08/05/2012.  Findings: Mild soft tissue swelling around the ankle both medially and laterally.  No acute displaced fracture, subluxation or dislocation.  No soft tissue calcifications.  No definite bony erosions.  There is an irregular area of sclerosis with central lucency in the posterior aspect of the calcaneus, likely related to chronic degenerative changes at the insertion point of the Achilles tendon.  IMPRESSION: 1.  Soft tissue swelling surrounding the ankle joint, without underlying acute bony abnormality.   Original Report Authenticated By: Trudie Reed, M.D.   US Venous Img Lower  Bilateral  05/25/2013   *RADIOLOGY REPORT*  Clinical Data: Bilateral leg tingling  VENOUS DUPLEX ULTRASOUND OF BILATERAL LOWER EXTREMITIES  Technique:  Gray-scale sonography with graded compression, as well as color Doppler and duplex ultrasound, were performed to evaluate the deep venous system of both lower extremities from the level of the common femoral vein through the popliteal and proximal calf veins.  Spectral Doppler was utilized to evaluate flow at rest and with distal augmentation maneuvers.  Comparison:  None.  Findings: Ultrasound examination bilateral lower extremity demonstrate the visualized deep veins to be patent with good augmentation and compressibility noted.  There is no evidence of deep vein thrombosis.  No evidence of superficial vein thrombosis. Subcutaneous edema noted bilateral calfs.  IMPRESSION: No evidence of deep vein thrombosis bilateral lower extremity.   Original Report Authenticated By: Natasha Mead, M.D.   Dg Chest Port 1 View  05/18/2013   *RADIOLOGY REPORT*  Clinical Data: Chest pain and shortness of breath.  PORTABLE CHEST - 1 VIEW  Comparison: Chest x-ray 02/17/2013.  Findings: Lung volumes are low.  The film is under penetrated, which limits the diagnostic sensitivity and specificity of this examination.  With this limitation in mind, there appears to be opacification of the base of the left hemithorax which may represent atelectasis and/or consolidation.  Probable trace bilateral pleural effusions.  There is cephalization of the pulmonary vasculature and slight indistinctness of the interstitial markings suggestive of mild pulmonary edema.  Mild cardiomegaly. The patient is rotated to the left on today's exam, resulting in distortion of the mediastinal contours and reduced diagnostic sensitivity and specificity for mediastinal pathology.  IMPRESSION: 1.  Findings are concerning for mild congestive heart failure with trace bilateral pleural effusions. 2.  Possible area of  atelectasis and/or consolidation in the left lower lobe.   Original Report Authenticated By: Trudie Reed, M.D.    Microbiology: Recent Results (from the past 240 hour(s))  MRSA PCR SCREENING     Status: None   Collection Time    05/18/13  3:25 PM      Result Value Range Status   MRSA by PCR NEGATIVE  NEGATIVE Final   Comment:            The GeneXpert MRSA Assay (FDA     approved for NASAL specimens     only), is one component of a     comprehensive MRSA colonization     surveillance program. It is not     intended to diagnose MRSA     infection nor to guide or     monitor treatment for     MRSA infections.     Labs: Basic Metabolic Panel:  Recent Labs Lab 05/21/13 0426 05/25/13 0928 05/25/13 1323 05/26/13 0437 05/27/13 0449  NA 141 140  --  138 141  K  3.2* 3.0*  --  3.3* 3.4*  CL 96 102  --  100 101  CO2 37*  --   --  31 31  GLUCOSE 124* 119*  --  134* 111*  BUN 22 19  --  24* 24*  CREATININE 1.66* 1.40* 1.39* 1.39* 1.41*  CALCIUM 9.5  --   --  9.5 9.7   Liver Function Tests: No results found for this basename: AST, ALT, ALKPHOS, BILITOT, PROT, ALBUMIN,  in the last 168 hours No results found for this basename: LIPASE, AMYLASE,  in the last 168 hours No results found for this basename: AMMONIA,  in the last 168 hours CBC:  Recent Labs Lab 05/25/13 0928 05/25/13 1323  WBC  --  9.9  HGB 15.6 15.5  HCT 46.0 46.9  MCV  --  81.0  PLT  --  253   Cardiac Enzymes:  Recent Labs Lab 05/20/13 1446 05/20/13 2001  TROPONINI <0.30 <0.30   BNP: BNP (last 3 results)  Recent Labs  01/19/13 0500 02/17/13 0945 05/18/13 1023  PROBNP 242.1* 1249.0* 1443.0*   CBG: No results found for this basename: GLUCAP,  in the last 168 hours     Signed:  Gwenyth Bender  [name] 05/27/2013, 9:40 AM  Attending Note:  Patient seen and independently examined.  Above note reviewed and agree.  He is readmitted to the hospital with a fib with rvr, acute  on chronic diastolic CHF and acute gout.  His symptoms are now improved with treatments and he is near baseline.  He is certainly a high risk for readmission due to his persistent noncompliance.  Hopefully [facility] can help break this cycle.  Ultimately he would benefit from ALF placement.  Medicaid application process has been started for him, and hopefully this can be arranged in the outpatient setting. His prescriptions were called into [facility] pharmacy at his request.  Erick Blinks

## 2013-05-27 NOTE — Care Management Note (Signed)
    Page 1 of 2   05/27/2013     9:43:22 AM   CARE MANAGEMENT NOTE 05/27/2013  Patient:  Todd Page, Todd Page   Account Number:  [id number]  Date Initiated:  05/27/2013  Documentation initiated by:  Sharrie Rothman  Subjective/Objective Assessment:   Pt readmitted from home with a fib and CHF. Pt is very noncompliant with treatment. Pt lives with his mother and will return home at discharge. Pt will be working on [employer] and placement from home. Pt has prior PCP appt with [facility]     Action/Plan:   [facility]. CM reinforced appt time with pt. [facility] set up for RN and CSW. CSW spoke with pt about placement process and Medicaid app. HH CSW can also help with this process. No DME needs noted. Alroy Bailiff of [facility] aware of consult. [facility] refe   Anticipated DC Date:  05/27/2013   Anticipated DC Plan:  HOME W HOME HEALTH SERVICES  In-house referral  Clinical Social Worker      DC Associate Professor  CM consult      [facility] Choice  HOME HEALTH   Choice offered to / List presented to:  C-1 Patient        HH arranged  HH-1 RN  HH-6 SOCIAL WORKER      HH agency  [facility]   Status of service:  Completed, signed off Medicare Important Message given?  NA - LOS <3 / Initial given by admissions (If response is "NO", the following Medicare IM given date fields will be blank) Date Medicare IM given:   Date Additional Medicare IM given:    Discharge Disposition:  HOME W HOME HEALTH SERVICES  Per UR Regulation:    If discussed at Long Length of Stay Meetings, dates discussed:    Comments:  05/27/13 0940 Arlyss Queen, RN BSN CM [facility] referral made to follow for chronic conditions.

## 2013-05-27 NOTE — Clinical Social Work Note (Signed)
CSW spoke w patient, explained will need to pursue ALF placement from home if patient desires.  PASARR screen submitted, assigned to [facility].  Patient agreeable to screener arranging screening interview at home.  Will need home health to read Tb skin test and assist w placement process. RN CM working w patient to arrange home health needs and referral to PCP.  Patient verbalized understanding w process required for ALF placement from home, was agreeable to participate w process.  Santa Genera, LCSW Clinical Social Worker [phone])

## 2013-05-27 NOTE — Progress Notes (Signed)
Patient ambulated in hallway. O2 sat 92% during and after ambulation.

## 2013-05-27 NOTE — Progress Notes (Signed)
Thank you you to [facility] for this referral.  Patient will be new to the [facility] in [place].  Dr Willey Blade is the lead physician here according to the inpatient care management staff.  Unfortunately, Dr August Saucer is not listed as a [facility] PCP at this time.  We for this reason will not be able to formally engage Todd Page for care support.  Of note, [facility] Care Management services does not replace or interfere with any services that are arranged by inpatient case management or social work.  For additional questions or referrals please contact Anibal Henderson BSN RN [facility] Liaison at [phone].

## 2013-05-27 NOTE — Progress Notes (Signed)
Nutrition Education Note  RD consulted for nutrition education regarding a Heart Healthy diet.   Pt reports he usually skips breakfast and has some time of frozen meal Physicist, medical) for lunch, [employer] at dinner. He demonstrated excellent understanding of label reading and components of generally healthy diet. Activity is limited and understands the connection to increasing activity and using stored energy to promote weight loss.  Lipid Panel  No results found for this basename: chol, trig, hdl, cholhdl, vldl, ldlcalc    RD provided "Heart Healthy Nutrition Therapy" handout from the Academy of Nutrition and Dietetics. Reviewed patient's dietary recall. Provided examples on ways to decrease sodium and fat intake in diet. Discouraged intake of processed foods and use of salt shaker. Encouraged fresh fruits and vegetables as well as whole grain sources of carbohydrates to maximize fiber intake. Teach back method used.  Expect fair compliance.  Body mass index is 42.6 kg/(m^2). Pt meets criteria for extreme obestiy class III based on current BMI.  Current diet order is Heart Healthy, patient is consuming approximately % of meals at this time. Labs and medications reviewed. No further nutrition interventions warranted at this time. RD contact information provided. If additional nutrition issues arise, please re-consult RD.  Royann Shivers MS,RD,LDN,CSG Office: [phone] Pager: [phone]

## 2013-05-27 NOTE — Clinical Social Work Placement (Signed)
    Clinical Social Work Department CLINICAL SOCIAL WORK PLACEMENT NOTE 05/28/2013  Patient:  Todd Page, Todd Page  Account Number:  [id number] Admit date:  05/25/2013  Clinical Social Worker:  Santa Genera, CLINICAL SOCIAL WORKER  Date/time:  05/27/2013 09:30 AM  Clinical Social Work is seeking post-discharge placement for this patient at the following level of care:   ASSISTED LIVING/REST HOME   (*CSW will update this form in Epic as items are completed)   05/26/2013  Patient/family provided with [facility] Department of Clinical Social Work's list of facilities offering this level of care within the geographic area requested by the patient (or if unable, by the patient's family).  05/26/2013  Patient/family informed of their freedom to choose among providers that offer the needed level of care, that participate in Medicare, Medicaid or managed care program needed by the patient, have an available bed and are willing to accept the patient.    Patient/family informed of [facility]' ownership interest in [facility], as well as of the fact that they are under no obligation to receive care at this facility.  PASARR submitted to EDS on 05/27/2013 PASARR number received from EDS on   FL2 transmitted to all facilities in geographic area requested by pt/family on   FL2 transmitted to all facilities within larger geographic area on   Patient informed that his/her managed care company has contracts with or will negotiate with  certain facilities, including the following:     Patient/family informed of bed offers received:   Patient chooses bed at  Physician recommends and patient chooses bed at    Patient to be transferred to  on   Patient to be transferred to facility by   The following physician request were entered in Epic:   Additional Comments: B Ratliffe working w patient on [place] application for ALF.  Level 2 PASARR screen submitted, will go to [facility] screener, home  contact information given.  Tb test placed.  RN CM arranging home health staff to assist w ALF placement if patient desires. Placement suspended at patient request, has decided to return home at discharge.  Santa Genera, LCSW Clinical Social Worker [phone])

## 2013-06-06 ENCOUNTER — Encounter (HOSPITAL_COMMUNITY): Payer: Self-pay | Admitting: *Deleted

## 2013-06-06 ENCOUNTER — Encounter (HOSPITAL_COMMUNITY): Payer: Self-pay | Admitting: Emergency Medicine

## 2013-06-06 ENCOUNTER — Emergency Department (HOSPITAL_COMMUNITY): Payer: Medicare Other

## 2013-06-06 ENCOUNTER — Emergency Department (HOSPITAL_COMMUNITY)
Admission: EM | Admit: 2013-06-06 | Discharge: 2013-06-06 | Disposition: A | Discharge status: Home / Self Care | Payer: Medicare Other | Attending: Emergency Medicine | Admitting: Emergency Medicine

## 2013-06-06 DIAGNOSIS — R778 Other specified abnormalities of plasma proteins: Secondary | ICD-10-CM

## 2013-06-06 DIAGNOSIS — Z9119 Patient's noncompliance with other medical treatment and regimen: Secondary | ICD-10-CM

## 2013-06-06 DIAGNOSIS — Z862 Personal history of diseases of the blood and blood-forming organs and certain disorders involving the immune mechanism: Secondary | ICD-10-CM | POA: Insufficient documentation

## 2013-06-06 DIAGNOSIS — Z91199 Patient's noncompliance with other medical treatment and regimen due to unspecified reason: Secondary | ICD-10-CM | POA: Insufficient documentation

## 2013-06-06 DIAGNOSIS — R0789 Other chest pain: Secondary | ICD-10-CM | POA: Insufficient documentation

## 2013-06-06 DIAGNOSIS — R0682 Tachypnea, not elsewhere classified: Secondary | ICD-10-CM

## 2013-06-06 DIAGNOSIS — R600 Localized edema: Secondary | ICD-10-CM

## 2013-06-06 DIAGNOSIS — R51 Headache: Secondary | ICD-10-CM | POA: Insufficient documentation

## 2013-06-06 DIAGNOSIS — Z8719 Personal history of other diseases of the digestive system: Secondary | ICD-10-CM | POA: Insufficient documentation

## 2013-06-06 DIAGNOSIS — I5032 Chronic diastolic (congestive) heart failure: Secondary | ICD-10-CM | POA: Insufficient documentation

## 2013-06-06 DIAGNOSIS — Z8679 Personal history of other diseases of the circulatory system: Secondary | ICD-10-CM | POA: Insufficient documentation

## 2013-06-06 DIAGNOSIS — M79604 Pain in right leg: Secondary | ICD-10-CM

## 2013-06-06 DIAGNOSIS — R0989 Other specified symptoms and signs involving the circulatory and respiratory systems: Secondary | ICD-10-CM | POA: Insufficient documentation

## 2013-06-06 DIAGNOSIS — R0602 Shortness of breath: Secondary | ICD-10-CM | POA: Insufficient documentation

## 2013-06-06 DIAGNOSIS — I10 Essential (primary) hypertension: Secondary | ICD-10-CM

## 2013-06-06 DIAGNOSIS — Z7982 Long term (current) use of aspirin: Secondary | ICD-10-CM | POA: Insufficient documentation

## 2013-06-06 DIAGNOSIS — E876 Hypokalemia: Secondary | ICD-10-CM

## 2013-06-06 DIAGNOSIS — I4891 Unspecified atrial fibrillation: Secondary | ICD-10-CM | POA: Insufficient documentation

## 2013-06-06 DIAGNOSIS — R609 Edema, unspecified: Secondary | ICD-10-CM | POA: Insufficient documentation

## 2013-06-06 DIAGNOSIS — Z8639 Personal history of other endocrine, nutritional and metabolic disease: Secondary | ICD-10-CM | POA: Insufficient documentation

## 2013-06-06 DIAGNOSIS — R0600 Dyspnea, unspecified: Secondary | ICD-10-CM

## 2013-06-06 DIAGNOSIS — N183 Chronic kidney disease, stage 3 unspecified: Secondary | ICD-10-CM

## 2013-06-06 DIAGNOSIS — R000 Tachycardia, unspecified: Secondary | ICD-10-CM | POA: Insufficient documentation

## 2013-06-06 DIAGNOSIS — E663 Overweight: Secondary | ICD-10-CM | POA: Insufficient documentation

## 2013-06-06 DIAGNOSIS — I5023 Acute on chronic systolic (congestive) heart failure: Secondary | ICD-10-CM

## 2013-06-06 DIAGNOSIS — J9600 Acute respiratory failure, unspecified whether with hypoxia or hypercapnia: Secondary | ICD-10-CM

## 2013-06-06 DIAGNOSIS — R0609 Other forms of dyspnea: Secondary | ICD-10-CM | POA: Insufficient documentation

## 2013-06-06 DIAGNOSIS — Z79899 Other long term (current) drug therapy: Secondary | ICD-10-CM | POA: Insufficient documentation

## 2013-06-06 DIAGNOSIS — R0902 Hypoxemia: Secondary | ICD-10-CM | POA: Insufficient documentation

## 2013-06-06 DIAGNOSIS — Z9114 Patient's other noncompliance with medication regimen: Secondary | ICD-10-CM

## 2013-06-06 DIAGNOSIS — R079 Chest pain, unspecified: Secondary | ICD-10-CM

## 2013-06-06 DIAGNOSIS — Z8659 Personal history of other mental and behavioral disorders: Secondary | ICD-10-CM | POA: Insufficient documentation

## 2013-06-06 DIAGNOSIS — R05 Cough: Secondary | ICD-10-CM | POA: Insufficient documentation

## 2013-06-06 DIAGNOSIS — E669 Obesity, unspecified: Secondary | ICD-10-CM

## 2013-06-06 DIAGNOSIS — M7989 Other specified soft tissue disorders: Secondary | ICD-10-CM | POA: Insufficient documentation

## 2013-06-06 DIAGNOSIS — Z9889 Other specified postprocedural states: Secondary | ICD-10-CM | POA: Insufficient documentation

## 2013-06-06 DIAGNOSIS — M109 Gout, unspecified: Secondary | ICD-10-CM

## 2013-06-06 DIAGNOSIS — I5031 Acute diastolic (congestive) heart failure: Secondary | ICD-10-CM

## 2013-06-06 DIAGNOSIS — R059 Cough, unspecified: Secondary | ICD-10-CM | POA: Insufficient documentation

## 2013-06-06 DIAGNOSIS — F259 Schizoaffective disorder, unspecified: Secondary | ICD-10-CM | POA: Insufficient documentation

## 2013-06-06 HISTORY — DX: Unspecified atrial fibrillation: I48.91

## 2013-06-06 LAB — CBC WITH DIFFERENTIAL/PLATELET
Basophils Absolute: 0 10*3/uL (ref 0.0–0.1)
Basophils Relative: 0 % (ref 0–1)
MCHC: 32.7 g/dL (ref 30.0–36.0)
Monocytes Absolute: 0.8 10*3/uL (ref 0.1–1.0)
Neutro Abs: 6.4 10*3/uL (ref 1.7–7.7)
Neutrophils Relative %: 74 % (ref 43–77)
Platelets: 199 10*3/uL (ref 150–400)
RDW: 14.5 % (ref 11.5–15.5)
WBC: 8.7 10*3/uL (ref 4.0–10.5)

## 2013-06-06 LAB — COMPREHENSIVE METABOLIC PANEL
ALT: 18 U/L (ref 0–53)
AST: 15 U/L (ref 0–37)
Albumin: 3.4 g/dL — ABNORMAL LOW (ref 3.5–5.2)
Chloride: 105 mEq/L (ref 96–112)
Creatinine, Ser: 1.12 mg/dL (ref 0.50–1.35)
Potassium: 4 mEq/L (ref 3.5–5.1)
Sodium: 142 mEq/L (ref 135–145)
Total Bilirubin: 0.3 mg/dL (ref 0.3–1.2)

## 2013-06-06 LAB — APTT: aPTT: 26 seconds (ref 24–37)

## 2013-06-06 LAB — URINALYSIS, ROUTINE W REFLEX MICROSCOPIC
Glucose, UA: NEGATIVE mg/dL
Leukocytes, UA: NEGATIVE
Urobilinogen, UA: 0.2 mg/dL (ref 0.0–1.0)

## 2013-06-06 LAB — URINE MICROSCOPIC-ADD ON

## 2013-06-06 LAB — TROPONIN I: Troponin I: <0.3 ng/mL (ref <0.30)

## 2013-06-06 MED ORDER — DILTIAZEM HCL ER COATED BEADS 360 MG PO CP24: 360.0000 mg | ORAL_CAPSULE | Freq: Every day | ORAL | Status: DC

## 2013-06-06 MED ORDER — LISINOPRIL 10 MG PO TABS: 10.0000 mg | ORAL_TABLET | Freq: Every day | ORAL | Status: DC

## 2013-06-06 MED ORDER — IPRATROPIUM BROMIDE 0.02 % IN SOLN
0.5000 mg | Freq: Once | RESPIRATORY_TRACT | Status: AC
  Administered 2013-06-06: 0.5 mg via RESPIRATORY_TRACT
  Filled 2013-06-06: qty 2.5

## 2013-06-06 MED ORDER — NITROGLYCERIN 2 % TD OINT
1.0000 [in_us] | TOPICAL_OINTMENT | Freq: Once | TRANSDERMAL | Status: AC
  Administered 2013-06-06: 1 [in_us] via TOPICAL
  Filled 2013-06-06: qty 1

## 2013-06-06 MED ORDER — FUROSEMIDE 40 MG PO TABS: 40.0000 mg | ORAL_TABLET | Freq: Two times a day (BID) | ORAL | Status: DC

## 2013-06-06 MED ORDER — ACETAMINOPHEN 325 MG PO TABS
650.0000 mg | ORAL_TABLET | Freq: Once | ORAL | Status: AC
  Administered 2013-06-06: 650 mg via ORAL
  Filled 2013-06-06: qty 2

## 2013-06-06 MED ORDER — ALBUTEROL SULFATE (5 MG/ML) 0.5% IN NEBU
5.0000 mg | INHALATION_SOLUTION | Freq: Once | RESPIRATORY_TRACT | Status: AC
  Administered 2013-06-06: 5 mg via RESPIRATORY_TRACT
  Filled 2013-06-06: qty 1

## 2013-06-06 MED ORDER — ACETAMINOPHEN 325 MG PO TABS
650.0000 mg | ORAL_TABLET | Freq: Once | ORAL | Status: DC
  Filled 2013-06-06: qty 2

## 2013-06-06 MED ORDER — POTASSIUM CHLORIDE ER 20 MEQ PO TBCR: 40.0000 meq | EXTENDED_RELEASE_TABLET | Freq: Every day | ORAL | Status: DC

## 2013-06-06 MED ORDER — FUROSEMIDE 10 MG/ML IJ SOLN
60.0000 mg | Freq: Once | INTRAMUSCULAR | Status: AC
  Administered 2013-06-06: 60 mg via INTRAVENOUS
  Filled 2013-06-06: qty 4

## 2013-06-06 MED ORDER — FUROSEMIDE 10 MG/ML IJ SOLN
40.0000 mg | Freq: Once | INTRAMUSCULAR | Status: AC
  Administered 2013-06-06: 40 mg via INTRAVENOUS
  Filled 2013-06-06: qty 4

## 2013-06-06 NOTE — ED Notes (Signed)
Pt came out of room demanding to be placed on Oxygen because he is hyperventilating.  Another nurse instructed him to go back to his room because walking the halls would not help and there is no oxygen in the halls.  The patient rudely called the nurse stupid repeatedly and got loud.  The Clinical research associate of this note instructed the nurse to go back to his room and not make these comments to the staff that was inappropriate. The patient ran to the secretaries desk and said "call security this man is gonna beat my ass". The Clinical research associate of this note assured him that he was in no danger but he had two choices, he could either go to his room and wait for the EDP or security would be called to escort him out for disruptive behavior.  The patient went to his room.

## 2013-06-06 NOTE — ED Notes (Signed)
While ambulating to the corner of the nurses desk and back pt. Oxygen level went down to 84. He became SOB and RR increased.  Heart Rate stayed between 104-115

## 2013-06-06 NOTE — ED Notes (Signed)
Patient now states that chest discomfort started last night, while talking to Dr Lynelle Doctor.

## 2013-06-06 NOTE — ED Notes (Signed)
Pt out in hallway, when asked if he needed something pt started panting yelling he needed oxygen, Pt was instructed to go back in his room, he again repeated he needed oxygen, yelling a little louder than the first time. Pt asked RN if she was stupid and became very agitated, pacing in the hall, No acute respiratory distress was noted. Pt continued to scream, security called to room, pt was told to go to his room and continued to state he needed help, going to the desk. Security arrived and pt willingly went into room.

## 2013-06-06 NOTE — ED Notes (Signed)
Pt discharged from ER earlier today. Now co headache.

## 2013-06-06 NOTE — ED Notes (Signed)
Patient brought in via EMS from home. Airway patent. Patient c/o non-radiating mid-sternal chest pain with shortness of breath that started this morning. Denies any nausea or vomiting. Reports hx of A-fib. Patient reports taking 4 81mg  baby aspirin today.

## 2013-06-06 NOTE — ED Provider Notes (Signed)
CSN: [id number]     Arrival date & time 06/06/13  1191 History    This chart was scribed for Ward Givens, MD,  by Ashley Jacobs, ED Scribe. The patient was seen in room APA04/APA04 and the patient's care was started at 7:54 AM.     Chief Complaint  Patient presents with  . Chest Pain   (Consider location/radiation/quality/duration/timing/severity/associated sxs/prior Treatment) Patient is a 62 y.o. male presenting with chest pain. The history is provided by the patient and medical records. No language interpreter was used.  Chest Pain Pain quality: burning   Pain radiates to:  Does not radiate  HPI Comments: Todd Page is a 62 y.o. male who presents to the Emergency Department complaining of mid-sternal, non radiating CP the presented the morning of arrival. Pt is experiencing associated symptoms of SOB the presented the morning of arrival. Pt reports taking Asprin 324 mg 1 hr PTA and denies taking medications regular Pt report bilateral leg swelling and redness that has been gradually improving. Pt denies heart palpitations, nausea and vomiting. Pt has a hx of  heart surgury, for atrial fibulation.  Pt denies smoking, drinking. Pt is a patient at [facility] and does not currently see physician regularly.   Patient states he started having shortness of breath last night. He states this morning he had central chest pain that he states is not sharp or a pressure feeling. He states it's in the center of his chest and he cannot describe it. He's unsure if he's had it before. He denies any nausea or vomiting. The pain does not radiate. He states he only started having a cough after he was on the way to the ED by EMS. He denies any abdominal distention. He states he has some swelling in his legs but it's much improved. He states he took 4 baby aspirin about 7:30 this morning. He states he's had heart surgery and indicates an area in the center of his chest where he states the surgery was. He  however cannot describe what type of heart surgery he had. Patient states he was recently discharged from the hospital and he takes no medications. He was asked for several times and he states does not know if he's supposed to be on medication.   PCP  none  Past Medical History  Diagnosis Date  . Hypertension     minimal coronary atherosclerosis by CT  . Schizoaffective disorder     With depression  . Atrial fibrillation 06/2012    onset 06/2012; normal TSH; EF-45%  . Overweight(278.02)   . Gout   . Noncompliance 09/21/2012  . Cholelithiasis     incidental finding on CT in 2013  . Systolic dysfunction   . A-fib    Past Surgical History  Procedure Laterality Date  . Colonoscopy  2006    normal screening examination  . Cardiac surgery      ? history accurate; no apparent incisions   Family History  Problem Relation Age of Onset  . Hypertension Mother   . Liver disease Father    History  Substance Use Topics  . Smoking status: Never Smoker   . Smokeless tobacco: Never Used  . Alcohol Use: No     Comment: former   lives at home Lives with mother On disability for his heart  Review of Systems  Review of Systems  Unable to perform ROS Respiratory: Positive for shortness of breath.   Cardiovascular: Positive for leg swelling (bilateral). Negative  for palpitations.  Gastrointestinal: Negative for nausea and vomiting.  All other systems reviewed and are negative.   Cardiovascular: Positive for chest pain.    Allergies  Review of patient's allergies indicates no known allergies.  Home Medications   Current Outpatient Rx  Name  Route  Sig  Dispense  Refill  . ALPRAZolam (XANAX) 0.25 MG tablet   Oral   Take 1 tablet (0.25 mg total) by mouth 2 (two) times daily as needed for anxiety or sleep.   30 tablet   0   . aspirin 81 MG tablet   Oral   Take 1 tablet (81 mg total) by mouth daily.   30 tablet   1   . Cyanocobalamin (VITAMIN B 12 PO)   Oral   Take 1  tablet by mouth daily.         Marland Kitchen diltiazem (CARDIZEM CD) 360 MG 24 hr capsule   Oral   Take 1 capsule (360 mg total) by mouth daily.   30 capsule   0   . furosemide (LASIX) 40 MG tablet   Oral   Take 1 tablet (40 mg total) by mouth 2 (two) times daily.   60 tablet   0   . HYDROcodone-acetaminophen (NORCO/VICODIN) 5-325 MG per tablet   Oral   Take 1-2 tablets by mouth every 4 (four) hours as needed.   30 tablet   0   . lisinopril (PRINIVIL,ZESTRIL) 10 MG tablet   Oral   Take 1 tablet (10 mg total) by mouth daily.   30 tablet   0   . potassium chloride 20 MEQ TBCR   Oral   Take 40 mEq by mouth daily.   60 tablet   0   . predniSONE (DELTASONE) 10 MG tablet      Take 2 tabs 7/31, 8/1 and 8/2 take 1 tab 8/3, 8/4 and 8/5 then stop.   9 tablet   0    BP 137/127  Pulse 108  Temp(Src) 97.8 F (36.6 C) (Oral)  Resp 28  Ht 6' (1.829 m)  Wt 314 lb (142.429 kg)  BMI 42.58 kg/m2  SpO2 97%  Vital signs normal except for tachycardia  Physical Exam  Nursing note and vitals reviewed. Constitutional: He is oriented to person, place, and time. He appears well-developed and well-nourished.  Obese   HENT:  Head: Normocephalic and atraumatic.  Right Ear: External ear normal.  Left Ear: External ear normal.  Nose: Nose normal. No mucosal edema or rhinorrhea.  Mouth/Throat: Dry mucus membranes .  Eyes: Conjunctivae and EOM are normal. Pupils are equal, round, and reactive to light.  Neck: Normal range of motion and full passive range of motion without pain. Neck supple.  Cardiovascular: Normal heart sounds.  Exam reveals no gallop and no friction rub.   No murmur heard. Tachycardia present with irregular rhythm Pulmonary/Chest: pt has tachypnea, he has diffuse decreased BS with few end expir wheezing.  He exhibits no tenderness and no crepitus of his chest wall.  Abdominal: Soft. Normal appearance and bowel sounds are normal. He obese and soft. There is no tenderness. There  is no rebound and no guarding.  Musculoskeletal: Normal range of motion.Pt has diffuse redness and swelling of his distal legs without warmth to palpation Moves all extremities well.   Neurological: He is alert and oriented to person, place, and time. He has normal strength. No cranial nerve deficit.  Skin: Skin is warm, dry and intact. No rash noted. No erythema.  No pallor.  Psychiatric: He has a normal mood and affect. His speech is normal and behavior is normal. His mood appears not anxious.    ED Course  DIAGNOSTIC STUDIES: Oxygen Saturation is 97% on [place], adequate by my interpretation.    Medications  albuterol (PROVENTIL) (5 MG/ML) 0.5% nebulizer solution 5 mg (5 mg Nebulization Given 06/06/13 0821)  ipratropium (ATROVENT) nebulizer solution 0.5 mg (0.5 mg Nebulization Given 06/06/13 0821)  furosemide (LASIX) injection 60 mg (60 mg Intravenous Given 06/06/13 0848)  nitroGLYCERIN (NITROGLYN) 2 % ointment 1 inch (1 inch Topical Given 06/06/13 0816)  acetaminophen (TYLENOL) tablet 650 mg (650 mg Oral Given 06/06/13 0818)     COORDINATION OF CARE: 8:00 AM Discussed course of care with pt.. Pt understands and agrees.  Review of his prior admissions shows he was just discharged July 30 after being admitted for atrial fib with fast ventricular response felt to be from diastolic dysfunction from congestive heart failure, he also had some mild respiratory failure felt to be a component of the congestive heart failure with obesity hypoventilation syndrome. He was noted to have massive edema of his legs which improved at the time of his discharge. He was evaluated for DVT which was negative. He was discharged on several medications and had a followup appointment at the [facility] clinic the following day on July 31. He also was discharged on July 21 after an episode of congestive heart failure. At that point he had the appointment with Dr. Hyman Bower made on July 31. Both admissions it is documented that the  patient is noncompliant.  11:20  He report that his chest pain has resolved and he is experiencing an increase in urinary output. His breathing is also better. He states he has filled one urinal and has about 400 cc in this one.   Pt ambulated by nursing staff and his pulse ox dropped to 84% and he got more SOB, HR was 104-115  12:28 Dr Karilyn Cota, wants patient to be discharged, he will see patient and discharge him.   Procedures (including critical care time)  Results for orders placed during the hospital encounter of 06/06/13  TROPONIN I      Result Value Range   Troponin I <0.30  <0.30 ng/mL  CBC WITH DIFFERENTIAL      Result Value Range   WBC 8.7  4.0 - 10.5 K/uL   RBC 5.47  4.22 - 5.81 MIL/uL   Hemoglobin 14.6  13.0 - 17.0 g/dL   HCT 16.1  09.6 - 04.5 %   MCV 81.5  78.0 - 100.0 fL   MCH 26.7  26.0 - 34.0 pg   MCHC 32.7  30.0 - 36.0 g/dL   RDW 40.9  81.1 - 91.4 %   Platelets 199  150 - 400 K/uL   Neutrophils Relative % 74  43 - 77 %   Neutro Abs 6.4  1.7 - 7.7 K/uL   Lymphocytes Relative 14  12 - 46 %   Lymphs Abs 1.2  0.7 - 4.0 K/uL   Monocytes Relative 9  3 - 12 %   Monocytes Absolute 0.8  0.1 - 1.0 K/uL   Eosinophils Relative 3  0 - 5 %   Eosinophils Absolute 0.3  0.0 - 0.7 K/uL   Basophils Relative 0  0 - 1 %   Basophils Absolute 0.0  0.0 - 0.1 K/uL  COMPREHENSIVE METABOLIC PANEL      Result Value Range   Sodium 142  135 - 145 mEq/L   Potassium 4.0  3.5 - 5.1 mEq/L   Chloride 105  96 - 112 mEq/L   CO2 28  19 - 32 mEq/L   Glucose, Bld 122 (*) 70 - 99 mg/dL   BUN 22  6 - 23 mg/dL   Creatinine, Ser 1.61  0.50 - 1.35 mg/dL   Calcium 9.3  8.4 - 09.6 mg/dL   Total Protein 6.4  6.0 - 8.3 g/dL   Albumin 3.4 (*) 3.5 - 5.2 g/dL   AST 15  0 - 37 U/L   ALT 18  0 - 53 U/L   Alkaline Phosphatase 50  39 - 117 U/L   Total Bilirubin 0.3  0.3 - 1.2 mg/dL   GFR calc non Af Amer 69 (*) >90 mL/min   GFR calc Af Amer 80 (*) >90 mL/min  PRO B NATRIURETIC PEPTIDE      Result Value  Range   Pro B Natriuretic peptide (BNP) 1214.0 (*) 0 - 125 pg/mL  APTT      Result Value Range   aPTT 26  24 - 37 seconds  PROTIME-INR      Result Value Range   Prothrombin Time 13.7  11.6 - 15.2 seconds   INR 1.07  0.00 - 1.49  URINALYSIS, ROUTINE W REFLEX MICROSCOPIC      Result Value Range   Color, Urine YELLOW  YELLOW   APPearance CLEAR  CLEAR   Specific Gravity, Urine 1.015  1.005 - 1.030   pH 6.5  5.0 - 8.0   Glucose, UA NEGATIVE  NEGATIVE mg/dL   Hgb urine dipstick TRACE (*) NEGATIVE   Bilirubin Urine NEGATIVE  NEGATIVE   Ketones, ur NEGATIVE  NEGATIVE mg/dL   Protein, ur TRACE (*) NEGATIVE mg/dL   Urobilinogen, UA 0.2  0.0 - 1.0 mg/dL   Nitrite NEGATIVE  NEGATIVE   Leukocytes, UA NEGATIVE  NEGATIVE  URINE MICROSCOPIC-ADD ON      Result Value Range   Squamous Epithelial / LPF RARE  RARE   RBC / HPF 0-2  <3 RBC/hpf   Bacteria, UA RARE  RARE   Laboratory interpretation all normal except elevated BNP from Feb, but similar to recents   Dg Chest 2 View  06/06/2013   *RADIOLOGY REPORT*  Clinical Data: Chest pain and short of breath  CHEST - 2 VIEW  Comparison: 05/25/2013  Findings: Cardiac enlargement without heart failure.  Increase in bibasilar atelectasis/infiltrate.  No significant effusion. Negative for edema.  IMPRESSION: Increased bibasilar atelectasis/infiltrate.   Original Report Authenticated By: Janeece Riggers, M.D.    Dg Chest 2 View  05/25/2013     IMPRESSION: Low lung volumes  with presumed bibasilar atelectasis or scarring.   Original Report Authenticated By: Christiana Pellant, M.D.   Dg Ankle Complete Left  05/25/2013  .  IMPRESSION: 1.  Soft tissue swelling surrounding the ankle joint, without underlying acute bony abnormality.   Original Report Authenticated By: Trudie Reed, M.D.   US Venous Img Lower Bilateral  05/25/2013  IMPRESSION: No evidence of deep vein thrombosis bilateral lower extremity.   Original Report Authenticated By: Natasha Mead, M.D.   Dg  Chest Port 1 View  05/18/2013  y.  IMPRESSION: 1.  Findings are concerning for mild congestive heart failure with trace bilateral pleural effusions. 2.  Possible area of atelectasis and/or consolidation in the left lower lobe.   Original Report Authenticated By: Trudie Reed, M.D.      Date: 06/06/2013  Rate: 107  Rhythm: atrial fibrillation  QRS Axis: normal  Intervals: normal  ST/T Wave abnormalities: normal  Conduction Disutrbances:none  Narrative Interpretation:   Old EKG Reviewed: unchanged from 05/25/2013 HR was 126    1. Chest pain   2. Dyspnea   3. CHF (congestive heart failure), chronic, diastolic   4. Noncompliance with medication regimen   5. Hypoxia    Disposition per Dr Josephina Gip, MD, FACEP   MDM  I personally performed the services described in this documentation, which was scribed in my presence. The recorded information has been reviewed and considered.  Devoria Albe, MD, Armando Gang    Ward Givens, MD 06/06/13 [phone]

## 2013-06-06 NOTE — ED Notes (Signed)
Security in room with patient, patient assisted with dressing.

## 2013-06-06 NOTE — ED Notes (Signed)
Dr Karilyn Cota talked to patient and reassessed. Patient still refusing to leave. Security called per Dr Patty Sermons instruction. Charge nurse in room talking to patient. Patient upset and throwing things in room.

## 2013-06-06 NOTE — ED Notes (Addendum)
Cab called for patient, voucher filled out, IV removed, doctor referral phone number given, and prescriptions given. Patient now refusing to sign discharge to leave, patient states "I do not think it is safe for me to go home now. I have a severe headache. I don't think this is right." Dr Lynelle Doctor made aware of refusal and c/o severe headache. Per Dr Lynelle Doctor feels that patient should be admitted and not discharged due to hypoxia and blood pressure and to let Dr Karilyn Cota know about patient's refusal. Dr Karilyn Cota notified of patient's refusal, severe headache, and Dr Delford Field concern about discharging patient. Dr Karilyn Cota coming back to talk to patient.

## 2013-06-06 NOTE — ED Provider Notes (Signed)
CSN: [id number]     Arrival date & time 06/06/13  1705 History     First MD Initiated Contact with Patient 06/06/13 1743     Chief Complaint  Patient presents with  . Headache  . Shortness of Breath   (Consider location/radiation/quality/duration/timing/severity/associated sxs/prior Treatment) Patient is a 62 y.o. male presenting with headaches and shortness of breath. The history is provided by the patient.  Headache Associated symptoms: no abdominal pain, no back pain, no congestion and no fever   Shortness of Breath Associated symptoms: headaches   Associated symptoms: no abdominal pain and no fever    Patient seen earlier in the emergency department for similar complaints had extensive workup consultation for consideration for admission criteria not met for admission. Patient was seen by the internal medicine hospitalist. Patient now comes back with complaint of headache he had that complaint he was discharged and persistent shortness of breath. Pulse ox on arrival here is 95%. Patient's previous chart and labs reviewed. The patient did have debris removed from the hospital by security at the prior visit. Patient complains of headache that started when he was in the emergency part and states that his shortness of breath persist patient is wanting admission. Patient has had extensive admissions in the past according to internal medicine trimming noncompliant today's labs and do not require admission. This was explained to the patient.  Past Medical History  Diagnosis Date  . Hypertension     minimal coronary atherosclerosis by CT  . Schizoaffective disorder     With depression  . Atrial fibrillation 06/2012    onset 06/2012; normal TSH; EF-45%  . Overweight(278.02)   . Gout   . Noncompliance 09/21/2012  . Cholelithiasis     incidental finding on CT in 2013  . Systolic dysfunction   . A-fib    Past Surgical History  Procedure Laterality Date  . Colonoscopy  2006    normal  screening examination  . Cardiac surgery      ? history accurate; no apparent incisions   Family History  Problem Relation Age of Onset  . Hypertension Mother   . Liver disease Father    History  Substance Use Topics  . Smoking status: Never Smoker   . Smokeless tobacco: Never Used  . Alcohol Use: No     Comment: former     Review of Systems  Constitutional: Negative for fever.  HENT: Negative for congestion.   Eyes: Negative for redness.  Respiratory: Positive for shortness of breath.   Cardiovascular: Positive for leg swelling.  Gastrointestinal: Negative for abdominal pain.  Musculoskeletal: Negative for back pain.  Neurological: Positive for headaches.  Hematological: Does not bruise/bleed easily.  Psychiatric/Behavioral: Negative for confusion.    Allergies  Review of patient's allergies indicates no known allergies.  Home Medications   Current Outpatient Rx  Name  Route  Sig  Dispense  Refill  . ALPRAZolam (XANAX) 0.25 MG tablet   Oral   Take 1 tablet (0.25 mg total) by mouth 2 (two) times daily as needed for anxiety or sleep.   30 tablet   0   . aspirin 81 MG tablet   Oral   Take 1 tablet (81 mg total) by mouth daily.   30 tablet   1   . Cyanocobalamin (VITAMIN B 12 PO)   Oral   Take 1 tablet by mouth daily.         Marland Kitchen diltiazem (CARDIZEM CD) 360 MG 24 hr capsule  Oral   Take 1 capsule (360 mg total) by mouth daily.   30 capsule   0   . furosemide (LASIX) 40 MG tablet   Oral   Take 1 tablet (40 mg total) by mouth 2 (two) times daily.   60 tablet   0   . HYDROcodone-acetaminophen (NORCO/VICODIN) 5-325 MG per tablet   Oral   Take 1-2 tablets by mouth every 4 (four) hours as needed.   30 tablet   0   . lisinopril (PRINIVIL,ZESTRIL) 10 MG tablet   Oral   Take 1 tablet (10 mg total) by mouth daily.   30 tablet   0   . Potassium Chloride ER 20 MEQ TBCR   Oral   Take 40 mEq by mouth daily.   60 tablet   0    BP 200/138  Pulse 95   Temp(Src) 98.1 F (36.7 C) (Oral)  Resp 24  SpO2 95% Physical Exam  Nursing note and vitals reviewed. Constitutional: He is oriented to person, place, and time. He appears well-developed and well-nourished. No distress.  HENT:  Mouth/Throat: Oropharynx is clear and moist.  Eyes: Conjunctivae and EOM are normal.  Neck: Normal range of motion.  Cardiovascular: Normal rate, regular rhythm and normal heart sounds.   Pulmonary/Chest: Effort normal and breath sounds normal. No respiratory distress. He has no wheezes. He has no rales.  Abdominal: Soft. Bowel sounds are normal. There is no tenderness.  Musculoskeletal: He exhibits edema.  Neurological: He is alert and oriented to person, place, and time. No cranial nerve deficit. He exhibits normal muscle tone. Coordination normal.  Skin: Skin is warm. There is erythema.    ED Course   Procedures (including critical care time)  Labs Reviewed - No data to display Dg Chest 2 View  06/06/2013   *RADIOLOGY REPORT*  Clinical Data: Chest pain and short of breath  CHEST - 2 VIEW  Comparison: 05/25/2013  Findings: Cardiac enlargement without heart failure.  Increase in bibasilar atelectasis/infiltrate.  No significant effusion. Negative for edema.  IMPRESSION: Increased bibasilar atelectasis/infiltrate.   Original Report Authenticated By: Janeece Riggers, M.D.   1. Headache   2. Shortness of breath     MDM  Patient returns for several complaints for what he was seen for earlier. Patient well known to internal medicine. They work consult for possible admission they stated that admission was not warranted patient has a long history of noncompliance. Patient's pulse ox is currently 95% no wheezing lungs are clear patient complains of a headache also complained of that earlier patient appears to be in no acute distress will treat with Tylenol. Patient given resource guide to help find a primary care Dr. Barbaraann Faster lab results from earlier today provided to  him. Patient given prescription to fill the prescriptions he was given earlier today and take as directed. At the last visit patient had to be escorted out by security.   In addition patient able to walk around the emergency partner without any significant shortness of breath able to talk extensively without any winding.    Shelda Jakes, MD 06/06/13 [phone]

## 2013-06-06 NOTE — ED Notes (Signed)
Pt returns to er with c/o headache, sob, was seen in er earlier, given nitroglycerin, pt reports that he had the headache when he left the er earlier.

## 2013-06-06 NOTE — Consult Note (Signed)
[name] Medical Consultation  RMANI KELLOGG NWG:[id number] DOB: 1951/01/26 DOA: 06/06/2013 PCP: No PCP Per Patient   Requesting physician: Dr. Lynelle Doctor, ER. Date of consultation: 06/06/2013 Reason for consultation: Dyspnea  Impression/Recommendations    1. Mild diastolic congestive heart failure, significantly improved whilst in the emergency room with intravenous diuresis. We will give him another intravenous Lasix 40 mg prior to discharge. Patient can be discharged home and he has been counseled once again that he must take his prescribed medications. I given this man prescriptions of Cardizem, Lasix, lisinopril and potassium today once again. 2. Atrial fibrillation, chronic. Not a candidate for anticoagulation in view of his noncompliance. 3. Obesity. 4. Hypertension. 5. Schizoaffective disorder. 6. Repeated history of medical noncompliance.     Chief Complaint: Dyspnea.  HPI:  This 62 year old man presents to the emergency room with history of dyspnea . He has repeated history of medical noncompliance. He he has been discharged on at least 2 occasions in the recent past with prescriptions of Lasix and he has claimed that he does not have any prescriptions. He is claiming this once again now. He has been offered placement at an assisted living facility but has declined this. He is improved during the emergency room stay with intravenous Lasix and has had a urine output of over 2 L.  Review of Systems:  Apart from history of present illness, other systems negative.  Past Medical History  Diagnosis Date  . Hypertension     minimal coronary atherosclerosis by CT  . Schizoaffective disorder     With depression  . Atrial fibrillation 06/2012    onset 06/2012; normal TSH; EF-45%  . Overweight(278.02)   . Gout   . Noncompliance 09/21/2012  . Cholelithiasis     incidental finding on CT in 2013  . Systolic dysfunction   . A-fib    Past Surgical History  Procedure Laterality  Date  . Colonoscopy  2006    normal screening examination  . Cardiac surgery      ? history accurate; no apparent incisions   Social History:  reports that he has never smoked. He has never used smokeless tobacco. He reports that he does not drink alcohol or use illicit drugs.  No Known Allergies Family History  Problem Relation Age of Onset  . Hypertension Mother   . Liver disease Father     Prior to Admission medications   Medication Sig Start Date End Date Taking? Authorizing Provider  ALPRAZolam (XANAX) 0.25 MG tablet Take 1 tablet (0.25 mg total) by mouth 2 (two) times daily as needed for anxiety or sleep. 05/21/13  Yes Nimish Normajean Glasgow, MD  aspirin 81 MG tablet Take 1 tablet (81 mg total) by mouth daily. 01/21/13  Yes Erick Blinks, MD  Cyanocobalamin (VITAMIN B 12 PO) Take 1 tablet by mouth daily.   Yes Historical Provider, MD  diltiazem (CARDIZEM CD) 360 MG 24 hr capsule Take 1 capsule (360 mg total) by mouth daily. 05/21/13  Yes Nimish Normajean Glasgow, MD  furosemide (LASIX) 40 MG tablet Take 1 tablet (40 mg total) by mouth 2 (two) times daily. 05/21/13  Yes Nimish Normajean Glasgow, MD  HYDROcodone-acetaminophen (NORCO/VICODIN) 5-325 MG per tablet Take 1-2 tablets by mouth every 4 (four) hours as needed. 05/21/13  Yes Nimish Normajean Glasgow, MD  lisinopril (PRINIVIL,ZESTRIL) 10 MG tablet Take 1 tablet (10 mg total) by mouth daily. 05/21/13  Yes Nimish Normajean Glasgow, MD  potassium chloride 20 MEQ TBCR Take 40 mEq by mouth  daily. 05/21/13  Yes Nimish Normajean Glasgow, MD   Physical Exam: Blood pressure 141/94, pulse 104, temperature 97.8 F (36.6 C), temperature source Oral, resp. rate 38, height 6' (1.829 m), weight 142.429 kg (314 lb), SpO2 95.00%. Filed Vitals:   06/06/13 1150  BP: 141/94  Pulse: 104  Temp:   Resp: 38     General:  He looks systemically well. He is morbidly obese.  Eyes: No pallor. No jaundice.  ENT: No abnormalities.  Neck: No lymphadenopathy.  Cardiovascular: Heart sounds are  in atrial fibrillation rate is slightly elevated but is fluctuating, around 80-100.  Respiratory: Lung fields are clear.  Abdomen: Soft, nontender.  Skin: No rash.  Musculoskeletal: No acute joint abnormalities.  Psychiatric: Argumentative.  Neurologic: Alert and orientated without any focal neurological signs.  Labs on Admission:  Basic Metabolic Panel:  Recent Labs Lab 06/06/13 0830  NA 142  K 4.0  CL 105  CO2 28  GLUCOSE 122*  BUN 22  CREATININE 1.12  CALCIUM 9.3   Liver Function Tests:  Recent Labs Lab 06/06/13 0830  AST 15  ALT 18  ALKPHOS 50  BILITOT 0.3  PROT 6.4  ALBUMIN 3.4*     CBC:  Recent Labs Lab 06/06/13 0830  WBC 8.7  NEUTROABS 6.4  HGB 14.6  HCT 44.6  MCV 81.5  PLT 199   Cardiac Enzymes:  Recent Labs Lab 06/06/13 0830  TROPONINI <0.30      Radiological Exams on Admission: Dg Chest 2 View  06/06/2013   *RADIOLOGY REPORT*  Clinical Data: Chest pain and short of breath  CHEST - 2 VIEW  Comparison: 05/25/2013  Findings: Cardiac enlargement without heart failure.  Increase in bibasilar atelectasis/infiltrate.  No significant effusion. Negative for edema.  IMPRESSION: Increased bibasilar atelectasis/infiltrate.   Original Report Authenticated By: Janeece Riggers, M.D.    EKG: Independently reviewed. Atrial fibrillation, no acute ST-T wave changes.  Time spent: 45 minutes.  Wilson Singer [name] Pager [phone].  If 7PM-7AM, please contact night-coverage [email or web address] Password [facility] 06/06/2013, 12:41 PM

## 2013-06-06 NOTE — ED Notes (Signed)
This nurse and charge nurse Elmer Bales went to discharge the patient. The patient was on the phone with 911 and was using threatening language with the charge nurse. The patient was instructed that he was being discharged and the patient started slamming stuff to the ground including the medication scanner, the wheelchair and the trash can.  The patient started yelling at the charge nurse and this stepped between the patient and the charge nurse and instructed the pt that he would not use abuse language or threaten my charge nurse, security was called and the patient was escorted out of the department.

## 2013-06-08 ENCOUNTER — Emergency Department (HOSPITAL_COMMUNITY): Payer: Medicare Other

## 2013-06-08 ENCOUNTER — Observation Stay (HOSPITAL_COMMUNITY)
Admission: EM | Admit: 2013-06-08 | Discharge: 2013-06-09 | Disposition: A | Discharge status: Home / Self Care | Payer: Medicare Other | Attending: Internal Medicine | Admitting: Internal Medicine

## 2013-06-08 ENCOUNTER — Encounter (HOSPITAL_COMMUNITY): Payer: Self-pay

## 2013-06-08 DIAGNOSIS — I5033 Acute on chronic diastolic (congestive) heart failure: Secondary | ICD-10-CM | POA: Insufficient documentation

## 2013-06-08 DIAGNOSIS — F259 Schizoaffective disorder, unspecified: Secondary | ICD-10-CM | POA: Diagnosis present

## 2013-06-08 DIAGNOSIS — Z9119 Patient's noncompliance with other medical treatment and regimen: Secondary | ICD-10-CM | POA: Insufficient documentation

## 2013-06-08 DIAGNOSIS — R0600 Dyspnea, unspecified: Secondary | ICD-10-CM

## 2013-06-08 DIAGNOSIS — J9600 Acute respiratory failure, unspecified whether with hypoxia or hypercapnia: Secondary | ICD-10-CM

## 2013-06-08 DIAGNOSIS — R0602 Shortness of breath: Secondary | ICD-10-CM | POA: Insufficient documentation

## 2013-06-08 DIAGNOSIS — I509 Heart failure, unspecified: Secondary | ICD-10-CM | POA: Insufficient documentation

## 2013-06-08 DIAGNOSIS — M79604 Pain in right leg: Secondary | ICD-10-CM

## 2013-06-08 DIAGNOSIS — Z91199 Patient's noncompliance with other medical treatment and regimen due to unspecified reason: Secondary | ICD-10-CM

## 2013-06-08 DIAGNOSIS — M109 Gout, unspecified: Secondary | ICD-10-CM

## 2013-06-08 DIAGNOSIS — R0682 Tachypnea, not elsewhere classified: Secondary | ICD-10-CM

## 2013-06-08 DIAGNOSIS — E669 Obesity, unspecified: Secondary | ICD-10-CM | POA: Diagnosis present

## 2013-06-08 DIAGNOSIS — R0989 Other specified symptoms and signs involving the circulatory and respiratory systems: Secondary | ICD-10-CM

## 2013-06-08 DIAGNOSIS — R079 Chest pain, unspecified: Principal | ICD-10-CM | POA: Diagnosis present

## 2013-06-08 DIAGNOSIS — E876 Hypokalemia: Secondary | ICD-10-CM | POA: Diagnosis present

## 2013-06-08 DIAGNOSIS — I5023 Acute on chronic systolic (congestive) heart failure: Secondary | ICD-10-CM

## 2013-06-08 DIAGNOSIS — I5031 Acute diastolic (congestive) heart failure: Secondary | ICD-10-CM

## 2013-06-08 DIAGNOSIS — I4891 Unspecified atrial fibrillation: Secondary | ICD-10-CM | POA: Diagnosis present

## 2013-06-08 DIAGNOSIS — I10 Essential (primary) hypertension: Secondary | ICD-10-CM | POA: Diagnosis present

## 2013-06-08 DIAGNOSIS — R600 Localized edema: Secondary | ICD-10-CM | POA: Diagnosis present

## 2013-06-08 LAB — COMPREHENSIVE METABOLIC PANEL
Albumin: 3.6 g/dL (ref 3.5–5.2)
Alkaline Phosphatase: 48 U/L (ref 39–117)
BUN: 19 mg/dL (ref 6–23)
Chloride: 104 mEq/L (ref 96–112)
Potassium: 3.5 mEq/L (ref 3.5–5.1)
Total Bilirubin: 0.5 mg/dL (ref 0.3–1.2)

## 2013-06-08 LAB — CBC
MCH: 26.4 pg (ref 26.0–34.0)
MCV: 81.4 fL (ref 78.0–100.0)
Platelets: 222 10*3/uL (ref 150–400)
RDW: 14.7 % (ref 11.5–15.5)
WBC: 8.5 10*3/uL (ref 4.0–10.5)

## 2013-06-08 LAB — TROPONIN I: Troponin I: <0.3 ng/mL (ref <0.30)

## 2013-06-08 LAB — PRO B NATRIURETIC PEPTIDE: Pro B Natriuretic peptide (BNP): 1162 pg/mL — ABNORMAL HIGH (ref 0–125)

## 2013-06-08 MED ORDER — DILTIAZEM HCL ER COATED BEADS 180 MG PO CP24
360.0000 mg | ORAL_CAPSULE | Freq: Once | ORAL | Status: AC
  Administered 2013-06-08: 360 mg via ORAL
  Filled 2013-06-08: qty 1

## 2013-06-08 MED ORDER — LISINOPRIL 10 MG PO TABS
10.0000 mg | ORAL_TABLET | Freq: Every day | ORAL | Status: DC
  Administered 2013-06-09: 10 mg via ORAL
  Filled 2013-06-08: qty 1

## 2013-06-08 MED ORDER — ENOXAPARIN SODIUM 40 MG/0.4ML ~~LOC~~ SOLN
40.0000 mg | SUBCUTANEOUS | Status: DC
  Administered 2013-06-08: 40 mg via SUBCUTANEOUS
  Filled 2013-06-08: qty 0.4

## 2013-06-08 MED ORDER — POTASSIUM CHLORIDE CRYS ER 20 MEQ PO TBCR
40.0000 meq | EXTENDED_RELEASE_TABLET | Freq: Once | ORAL | Status: AC
  Administered 2013-06-08: 40 meq via ORAL
  Filled 2013-06-08: qty 2

## 2013-06-08 MED ORDER — DILTIAZEM HCL ER COATED BEADS 180 MG PO CP24
360.0000 mg | ORAL_CAPSULE | Freq: Every day | ORAL | Status: DC
  Administered 2013-06-09: 360 mg via ORAL
  Filled 2013-06-08: qty 2

## 2013-06-08 MED ORDER — NITROGLYCERIN 0.4 MG SL SUBL
0.4000 mg | SUBLINGUAL_TABLET | SUBLINGUAL | Status: AC | PRN
  Administered 2013-06-08 (×3): 0.4 mg via SUBLINGUAL

## 2013-06-08 MED ORDER — HYDROCODONE-ACETAMINOPHEN 5-325 MG PO TABS: 1.0000 | ORAL_TABLET | ORAL | Status: DC | PRN

## 2013-06-08 MED ORDER — ASPIRIN EC 81 MG PO TBEC
81.0000 mg | DELAYED_RELEASE_TABLET | Freq: Every day | ORAL | Status: DC
  Administered 2013-06-09: 81 mg via ORAL
  Filled 2013-06-08: qty 1

## 2013-06-08 MED ORDER — FLEET ENEMA 7-19 GM/118ML RE ENEM: 1.0000 | ENEMA | Freq: Once | RECTAL | Status: AC | PRN

## 2013-06-08 MED ORDER — FUROSEMIDE 10 MG/ML IJ SOLN
40.0000 mg | Freq: Once | INTRAMUSCULAR | Status: AC
  Administered 2013-06-08: 40 mg via INTRAVENOUS
  Filled 2013-06-08: qty 4

## 2013-06-08 MED ORDER — ACETAMINOPHEN 650 MG RE SUPP: 650.0000 mg | Freq: Four times a day (QID) | RECTAL | Status: DC | PRN

## 2013-06-08 MED ORDER — HYDRALAZINE HCL 20 MG/ML IJ SOLN: 10.0000 mg | INTRAMUSCULAR | Status: DC | PRN

## 2013-06-08 MED ORDER — POTASSIUM CHLORIDE CRYS ER 20 MEQ PO TBCR
40.0000 meq | EXTENDED_RELEASE_TABLET | Freq: Every day | ORAL | Status: DC
  Administered 2013-06-09: 40 meq via ORAL
  Filled 2013-06-08 (×2): qty 2

## 2013-06-08 MED ORDER — ACETAMINOPHEN 325 MG PO TABS: 650.0000 mg | ORAL_TABLET | Freq: Four times a day (QID) | ORAL | Status: DC | PRN

## 2013-06-08 MED ORDER — ONDANSETRON HCL 4 MG PO TABS: 4.0000 mg | ORAL_TABLET | Freq: Four times a day (QID) | ORAL | Status: DC | PRN

## 2013-06-08 MED ORDER — BISACODYL 10 MG RE SUPP: 10.0000 mg | Freq: Every day | RECTAL | Status: DC | PRN

## 2013-06-08 MED ORDER — FUROSEMIDE 10 MG/ML IJ SOLN
40.0000 mg | Freq: Two times a day (BID) | INTRAMUSCULAR | Status: DC
  Administered 2013-06-08 – 2013-06-09 (×2): 40 mg via INTRAVENOUS
  Filled 2013-06-08 (×2): qty 4

## 2013-06-08 MED ORDER — ALPRAZOLAM 0.25 MG PO TABS: 0.2500 mg | ORAL_TABLET | Freq: Two times a day (BID) | ORAL | Status: DC | PRN

## 2013-06-08 MED ORDER — POTASSIUM CHLORIDE ER 20 MEQ PO TBCR: 40.0000 meq | EXTENDED_RELEASE_TABLET | Freq: Every day | ORAL | Status: DC

## 2013-06-08 MED ORDER — POLYETHYLENE GLYCOL 3350 17 G PO PACK: 17.0000 g | PACK | Freq: Every day | ORAL | Status: DC | PRN

## 2013-06-08 MED ORDER — ONDANSETRON HCL 4 MG/2ML IJ SOLN: 4.0000 mg | Freq: Four times a day (QID) | INTRAMUSCULAR | Status: DC | PRN

## 2013-06-08 NOTE — ED Notes (Signed)
Pt complain of being SOB and some confusion that started this morning

## 2013-06-08 NOTE — H&P (Signed)
[name] History and Physical  Todd Page  ZOX:[id number]  DOB: 1951-03-02   DOA: 06/08/2013   PCP:   No PCP Per Patient   Chief Complaint:  Shortness of breath and chest pain  HPI: Todd Page is a 62 y.o. male.  Morbidly obese middle-aged Caucasian gentleman with multiple medical problems listed below but well known to the emergency room and the hospitalist service for his noncompliance, and daily a total located primary care physician for followup, presents to the emergency room yet again complaining of shortness of breath. The patient was nontoxic appearing but because of the chest pain there was a concern for acute coronary syndrome, after many episodes of crying wolf. The patient is being brought in on observation to rule out acute coronary syndrome.  The patient's principal complaint on the ward is difficulty breathing, although he is sitting comfortable and in no distress; he is no longer having chest pain. He is unable to give a clear explanation of why he doesn't have primary care physician, but indicates that he was referred to a primary care provider on the corner of a [address] and [employer], told him that she was not eligible to prescribe. He therefore thought there was no point following up with her  Denies dizziness or diaphoresis denies syncope His BNP is close to baseline level  Rewiew of Systems:   Unchanged from previous visits   Past Medical History  Diagnosis Date  . Hypertension     minimal coronary atherosclerosis by CT  . Schizoaffective disorder     With depression  . Atrial fibrillation 06/2012    onset 06/2012; normal TSH; EF-45%  . Overweight(278.02)   . Gout   . Noncompliance 09/21/2012  . Cholelithiasis     incidental finding on CT in 2013  . Systolic dysfunction   . A-fib     Past Surgical History  Procedure Laterality Date  . Colonoscopy  2006    normal screening examination  . Cardiac surgery      ? history accurate; no apparent  incisions    Medications:  HOME MEDS: Prior to Admission medications   Medication Sig Start Date End Date Taking? Authorizing Provider  ALPRAZolam (XANAX) 0.25 MG tablet Take 1 tablet (0.25 mg total) by mouth 2 (two) times daily as needed for anxiety or sleep. 05/21/13   Nimish Normajean Glasgow, MD  aspirin 81 MG tablet Take 1 tablet (81 mg total) by mouth daily. 01/21/13   Erick Blinks, MD  Cyanocobalamin (VITAMIN B 12 PO) Take 1 tablet by mouth daily.    Historical Provider, MD  diltiazem (CARDIZEM CD) 360 MG 24 hr capsule Take 1 capsule (360 mg total) by mouth daily. 06/06/13   Nimish Normajean Glasgow, MD  furosemide (LASIX) 40 MG tablet Take 1 tablet (40 mg total) by mouth 2 (two) times daily. 06/06/13   Nimish Normajean Glasgow, MD  HYDROcodone-acetaminophen (NORCO/VICODIN) 5-325 MG per tablet Take 1-2 tablets by mouth every 4 (four) hours as needed. 05/21/13   Nimish Normajean Glasgow, MD  lisinopril (PRINIVIL,ZESTRIL) 10 MG tablet Take 1 tablet (10 mg total) by mouth daily. 06/06/13   Nimish Normajean Glasgow, MD  Potassium Chloride ER 20 MEQ TBCR Take 40 mEq by mouth daily. 06/06/13   Nimish Normajean Glasgow, MD     Allergies:  No Known Allergies  Social History:   reports that he has never smoked. He has never used smokeless tobacco. He reports that he does not drink alcohol or use  illicit drugs.  Family History: Family History  Problem Relation Age of Onset  . Hypertension Mother   . Liver disease Father      Physical Exam: Filed Vitals:   06/08/13 1821 06/08/13 1831 06/08/13 2008 06/08/13 2200  BP: 177/106 149/97 176/96 173/115  Pulse: 120 125 111 100  Temp:   96.9 F (36.1 C) 98.1 F (36.7 C)  TempSrc:   Axillary Axillary  Resp: 20 22 22 20   Height:      Weight:   65.409 kg (144 lb 3.2 oz)   SpO2: 94% 95% 99% 95%   Blood pressure 173/115, pulse 100, temperature 98.1 F (36.7 C), temperature source Axillary, resp. rate 20, height 6' (1.829 m), weight 65.409 kg (144 lb 3.2 oz), SpO2 95.00%. Body mass index is  19.55 kg/(m^2).   GEN:  Pleasant obese Caucasian gentleman sitting up in a chair in no acute distress at all; cooperative with exam; moves easily to the bed without assistance to prepare for the physical exam; no distress noted in moving from the chair in the corner lying on bed. PSYCH:  alert and oriented ; no evidence of delusional thinking;  neither anxious nor depressed; HEENT: Mucous membranes pink and anicteric; PERRLA; EOM intact; thick neck  Breasts:: Not examined CHEST WALL: No tenderness CHEST: Normal respiration, end expiratory HEART: Irregularly irregular rhythm; no murmurs rubs or gallops BACK: No kyphosis no scoliosis; no CVA tenderness ABDOMEN: Obese, soft non-tender; no masses, no organomegaly, normal abdominal bowel sounds; moderate pannus pannus; no intertriginous candida. Rectal Exam: Not done EXTREMITIES:  age-appropriate arthropathy of the hands and knees; 1+ edema; venous stasis changes with chronic erythema of legs; no ulcerations. Large water blister forming on leg Genitalia: not examined PULSES: 2+ and symmetric CNS: Cranial nerves 2-12 grossly intact no focal lateralizing neurologic deficit   Labs on Admission:  Basic Metabolic Panel:  Recent Labs Lab 06/06/13 0830 06/08/13 1640  NA 142 141  K 4.0 3.5  CL 105 104  CO2 28 29  GLUCOSE 122* 97  BUN 22 19  CREATININE 1.12 1.27  CALCIUM 9.3 9.1   Liver Function Tests:  Recent Labs Lab 06/06/13 0830 06/08/13 1640  AST 15 18  ALT 18 22  ALKPHOS 50 48  BILITOT 0.3 0.5  PROT 6.4 6.7  ALBUMIN 3.4* 3.6   No results found for this basename: LIPASE, AMYLASE,  in the last 168 hours No results found for this basename: AMMONIA,  in the last 168 hours CBC:  Recent Labs Lab 06/06/13 0830 06/08/13 1640  WBC 8.7 8.5  NEUTROABS 6.4  --   HGB 14.6 15.0  HCT 44.6 46.3  MCV 81.5 81.4  PLT 199 222   Cardiac Enzymes:  Recent Labs Lab 06/06/13 0830  TROPONINI <0.30   BNP: No components found with  this basename: POCBNP,  D-dimer: No components found with this basename: D-DIMER,  CBG: No results found for this basename: GLUCAP,  in the last 168 hours  Radiological Exams on Admission: Dg Chest 2 View  06/08/2013   *RADIOLOGY REPORT*  Clinical Data: Shortness of breath  CHEST - 2 VIEW  Comparison: June 06, 2013.  Findings: Stable mild cardiomegaly.  Elevated right hemidiaphragm is again noted and unchanged compared to prior exam.  Left lung is clear.  Linear density is noted in right lung base most consistent with subsegmental atelectasis or scarring.  This is unchanged compared to prior exam.  No pleural effusion or pneumothorax is noted.  IMPRESSION: Stable right  basilar opacity consistent with subsegmental atelectasis or scarring.   Original Report Authenticated By: Lupita Raider.,  M.D.    EKG: Independently reviewed. Atrial fibrillation rate just about 106; no acute changes   Assessment/Plan   Active Problems:   Schizoaffective disorder   Noncompliance   Hypertension   Atrial fibrillation with rapid ventricular response   Chest pain   PLAN: We'll bring in for serial cardiac enzymes to rule out acute coronary syndrome Will continue his prescribed home medications with the exception that I will give his Lasix intravenously Will consult social work to see if she can make another attempt to find him a primary care physician since this seems to principal reason why he is repeatedly returning to the emergency room.   Other plans as per orders.  Code Status: Full code Family Communication: Plans discuss with patient Disposition Plan:  Likely discharge in the morning if cardiac enzymes normal   Cesilia Shinn Nocturnist [name] Pager [phone]   06/08/2013, 10:55 PM

## 2013-06-08 NOTE — ED Notes (Signed)
Pt crying. States that nobody likes him and his family is plotting against him. Pt states he feels like he needs to go to a psychiatric hospital. States he was told when he was younger that his family told him he would end up in one.

## 2013-06-08 NOTE — ED Provider Notes (Signed)
CSN: [id number]     Arrival date & time 06/08/13  1513 History     First MD Initiated Contact with Patient 06/08/13 1531     Chief Complaint  Patient presents with  . Shortness of Breath   (Consider location/radiation/quality/duration/timing/severity/associated sxs/prior Treatment) Patient is a 62 y.o. male presenting with shortness of breath. The history is provided by the patient.  Shortness of Breath Severity:  Moderate Onset quality:  Gradual Duration:  12 hours Timing:  Constant Progression:  Worsening Chronicity:  Recurrent Context comment:  Noncompliance with CHF medications Relieved by:  Sitting up Exacerbated by: supine position. Associated symptoms: no abdominal pain, no cough, no fever and no vomiting     Past Medical History  Diagnosis Date  . Hypertension     minimal coronary atherosclerosis by CT  . Schizoaffective disorder     With depression  . Atrial fibrillation 06/2012    onset 06/2012; normal TSH; EF-45%  . Overweight(278.02)   . Gout   . Noncompliance 09/21/2012  . Cholelithiasis     incidental finding on CT in 2013  . Systolic dysfunction   . A-fib    Past Surgical History  Procedure Laterality Date  . Colonoscopy  2006    normal screening examination  . Cardiac surgery      ? history accurate; no apparent incisions   Family History  Problem Relation Age of Onset  . Hypertension Mother   . Liver disease Father    History  Substance Use Topics  . Smoking status: Never Smoker   . Smokeless tobacco: Never Used  . Alcohol Use: No     Comment: former     Review of Systems  Constitutional: Negative for fever.  Respiratory: Negative for cough and shortness of breath.   Gastrointestinal: Negative for vomiting and abdominal pain.  All other systems reviewed and are negative.    Allergies  Review of patient's allergies indicates no known allergies.  Home Medications   Current Outpatient Rx  Name  Route  Sig  Dispense  Refill  .  ALPRAZolam (XANAX) 0.25 MG tablet   Oral   Take 1 tablet (0.25 mg total) by mouth 2 (two) times daily as needed for anxiety or sleep.   30 tablet   0   . aspirin 81 MG tablet   Oral   Take 1 tablet (81 mg total) by mouth daily.   30 tablet   1   . Cyanocobalamin (VITAMIN B 12 PO)   Oral   Take 1 tablet by mouth daily.         Marland Kitchen diltiazem (CARDIZEM CD) 360 MG 24 hr capsule   Oral   Take 1 capsule (360 mg total) by mouth daily.   30 capsule   0   . furosemide (LASIX) 40 MG tablet   Oral   Take 1 tablet (40 mg total) by mouth 2 (two) times daily.   60 tablet   0   . HYDROcodone-acetaminophen (NORCO/VICODIN) 5-325 MG per tablet   Oral   Take 1-2 tablets by mouth every 4 (four) hours as needed.   30 tablet   0   . lisinopril (PRINIVIL,ZESTRIL) 10 MG tablet   Oral   Take 1 tablet (10 mg total) by mouth daily.   30 tablet   0   . Potassium Chloride ER 20 MEQ TBCR   Oral   Take 40 mEq by mouth daily.   60 tablet   0    BP 177/118  Pulse 102  Resp 20  Ht 6' (1.829 m)  Wt 314 lb (142.429 kg)  BMI 42.58 kg/m2  SpO2 94% Physical Exam  Nursing note and vitals reviewed. Constitutional: He is oriented to person, place, and time. He appears well-developed and well-nourished. No distress.  HENT:  Head: Normocephalic and atraumatic.  Mouth/Throat: No oropharyngeal exudate.  Eyes: EOM are normal. Pupils are equal, round, and reactive to light.  Neck: Normal range of motion. Neck supple.  Cardiovascular: Normal rate and regular rhythm.  Exam reveals no friction rub.   No murmur heard. Pulmonary/Chest: Effort normal and breath sounds normal. No respiratory distress. He has no wheezes. He has no rales.  Abdominal: He exhibits no distension. There is no tenderness. There is no rebound.  Musculoskeletal: Normal range of motion. He exhibits edema (bilateral, lower extremities, with redness. Patient states this is chronic).  Neurological: He is alert and oriented to person,  place, and time.  Skin: He is not diaphoretic.    ED Course   Procedures (including critical care time)  Labs Reviewed  CBC  COMPREHENSIVE METABOLIC PANEL  PRO B NATRIURETIC PEPTIDE   Dg Chest 2 View  06/08/2013   *RADIOLOGY REPORT*  Clinical Data: Shortness of breath  CHEST - 2 VIEW  Comparison: June 06, 2013.  Findings: Stable mild cardiomegaly.  Elevated right hemidiaphragm is again noted and unchanged compared to prior exam.  Left lung is clear.  Linear density is noted in right lung base most consistent with subsegmental atelectasis or scarring.  This is unchanged compared to prior exam.  No pleural effusion or pneumothorax is noted.  IMPRESSION: Stable right basilar opacity consistent with subsegmental atelectasis or scarring.   Original Report Authenticated By: Lupita Raider.,  M.D.   1. Chest pain   2. Shortness of breath      Date: 06/08/2013  Rate: 106  Rhythm: atrial fibrillation  QRS Axis: normal  Intervals: normal  ST/T Wave abnormalities: normal  Conduction Disutrbances:none  Narrative Interpretation:   Old EKG Reviewed: unchanged   MDM  12M presents with SOB. Began this morning, worse with supine position. Denies CP, fevers. Upon my initial exam, began crying uncontrollably and was unable to answer questions initially.  Patient was seen and evaluated by ED and by hospitalist 2 days ago. Patient states noncompliance with Lasix and received IV lasix with good diuresis and was discharged home.  Notes also state not treated with anti-coagulation due to noncompliance issues in the past and that he has declined placement in a nursing facility. Here, hypertensive, no hypoxia on room air. No crackes in lung bases.  Will repeat labs here. Will repeat Lasix here. Patient states non-compliance with his medications.  Patient with intermittent chest pain while here in the ED. Admitted to Observation status for ACS rule-out. Given PO Dilt for his heartrate.  Dagmar Hait, MD 06/08/13 2142

## 2013-06-09 DIAGNOSIS — R079 Chest pain, unspecified: Secondary | ICD-10-CM

## 2013-06-09 DIAGNOSIS — E876 Hypokalemia: Secondary | ICD-10-CM

## 2013-06-09 DIAGNOSIS — I5031 Acute diastolic (congestive) heart failure: Secondary | ICD-10-CM

## 2013-06-09 DIAGNOSIS — I509 Heart failure, unspecified: Secondary | ICD-10-CM

## 2013-06-09 LAB — TROPONIN I
Troponin I: <0.3 ng/mL (ref <0.30)
Troponin I: <0.3 ng/mL (ref <0.30)

## 2013-06-09 LAB — BASIC METABOLIC PANEL
Calcium: 9.3 mg/dL (ref 8.4–10.5)
GFR calc Af Amer: 68 mL/min — ABNORMAL LOW (ref >90)
GFR calc non Af Amer: 59 mL/min — ABNORMAL LOW (ref >90)
Potassium: 3 mEq/L — ABNORMAL LOW (ref 3.5–5.1)
Sodium: 140 mEq/L (ref 135–145)

## 2013-06-09 LAB — CBC
Hemoglobin: 15.3 g/dL (ref 13.0–17.0)
MCH: 26.2 pg (ref 26.0–34.0)
Platelets: 229 10*3/uL (ref 150–400)
RBC: 5.84 MIL/uL — ABNORMAL HIGH (ref 4.22–5.81)
WBC: 7.9 10*3/uL (ref 4.0–10.5)

## 2013-06-09 MED ORDER — FUROSEMIDE 40 MG PO TABS
40.0000 mg | ORAL_TABLET | Freq: Two times a day (BID) | ORAL | Status: DC
  Filled 2013-06-09: qty 1

## 2013-06-09 NOTE — Plan of Care (Signed)
Problem: Phase III Progression Outcomes Goal: Tolerating diet Talked to pt about importance of cardiac diet

## 2013-06-09 NOTE — Progress Notes (Signed)
[name] PROGRESS NOTE  TRUST LEH ZOX:[id number] DOB: [birth year] DOA: 06/08/2013 PCP: No PCP Per Patient  Assessment/Plan: Chest pain: atypical. Resolved this am. CE neg to date. EKG without evidence of ischemia.  Active Problems: Hypokalemia: related to diuretics. Will replete and recheck  Hypertension: controlled. Will transition lasix back to po. Continue lisinopril    Atrial fibrillation with rapid ventricular response: rate control adequate. Continue cardizem. Not on anticoagulant due to     Obesity: BMI 42.7. Has been evaluated by nutritional services several times. Counseled today regarding previous recommendations.     Schizoaffective disorder: appears stable at baseline.     Noncompliance     Code Status: full Family Communication: none present Disposition Plan: home when ready hopefully this afternoon   Consultants:  none  Procedures:  none  Antibiotics:  none  HPI/Subjective: Sitting up in chair. NAD denies pain  Objective: Filed Vitals:   06/09/13 0641  BP: 130/80  Pulse:   Temp:   Resp:     Intake/Output Summary (Last 24 hours) at 06/09/13 1102 Last data filed at 06/09/13 0400  Gross per 24 hour  Intake    240 ml  Output      0 ml  Net    240 ml   Filed Weights   06/08/13 2008 06/09/13 0500 06/09/13 0548  Weight: 144 lb 3.2 oz (65.409 kg) 319 lb 10.7 oz (145 kg) 319 lb 10.7 oz (145 kg)    Exam:   General:  Obese NAD  Cardiovascular: irregular no MGR Venous stasis changes 1+LE edema. Large blister right shin  Respiratory: normal effort BS clear with fine crackle right base. No wheeze  Abdomen: obese soft +BS non-tender to palpation   Musculoskeletal: no clubbing no cyanosis   Data Reviewed: Basic Metabolic Panel:  Recent Labs Lab 06/06/13 0830 06/08/13 1640 06/09/13 0513  NA 142 141 140  K 4.0 3.5 3.0*  CL 105 104 101  CO2 28 29 28   GLUCOSE 122* 97 125*  BUN 22 19 17   CREATININE 1.12 1.27 1.28  CALCIUM 9.3  9.1 9.3   Liver Function Tests:  Recent Labs Lab 06/06/13 0830 06/08/13 1640  AST 15 18  ALT 18 22  ALKPHOS 50 48  BILITOT 0.3 0.5  PROT 6.4 6.7  ALBUMIN 3.4* 3.6   No results found for this basename: LIPASE, AMYLASE,  in the last 168 hours No results found for this basename: AMMONIA,  in the last 168 hours CBC:  Recent Labs Lab 06/06/13 0830 06/08/13 1640 06/09/13 0513  WBC 8.7 8.5 7.9  NEUTROABS 6.4  --   --   HGB 14.6 15.0 15.3  HCT 44.6 46.3 47.1  MCV 81.5 81.4 80.7  PLT 199 222 229   Cardiac Enzymes:  Recent Labs Lab 06/06/13 0830 06/08/13 2256 06/09/13 0513  TROPONINI <0.30 <0.30 <0.30   BNP (last 3 results)  Recent Labs  05/18/13 1023 06/06/13 0830 06/08/13 1640  PROBNP 1443.0* 1214.0* 1162.0*   CBG: No results found for this basename: GLUCAP,  in the last 168 hours  No results found for this or any previous visit (from the past 240 hour(s)).   Studies: Dg Chest 2 View  06/08/2013   *RADIOLOGY REPORT*  Clinical Data: Shortness of breath  CHEST - 2 VIEW  Comparison: June 06, 2013.  Findings: Stable mild cardiomegaly.  Elevated right hemidiaphragm is again noted and unchanged compared to prior exam.  Left lung is clear.  Linear density is noted in right  lung base most consistent with subsegmental atelectasis or scarring.  This is unchanged compared to prior exam.  No pleural effusion or pneumothorax is noted.  IMPRESSION: Stable right basilar opacity consistent with subsegmental atelectasis or scarring.   Original Report Authenticated By: Lupita Raider.,  M.D.    Scheduled Meds: . aspirin EC  81 mg Oral Daily  . diltiazem  360 mg Oral Daily  . enoxaparin (LOVENOX) injection  40 mg Subcutaneous Q24H  . furosemide  40 mg Oral BID  . lisinopril  10 mg Oral Daily  . potassium chloride  40 mEq Oral Daily   Continuous Infusions:   Principal Problem:      Time spent: 30 minutes    [facility]  [name] Pager [phone]. If  7PM-7AM, please contact night-coverage at [email or web address], password [facility] 06/09/2013, 11:02 AM  LOS: 1 day   Attending note:  Patient seen and examined.  Please see discharge summary from later today for further details.  MEMON,JEHANZEB

## 2013-06-09 NOTE — Discharge Summary (Signed)
Physician Discharge Summary  Todd Page:[id number] DOB: 11-24-1950 DOA: 06/08/2013  PCP: No PCP Per Patient  Admit date: 06/08/2013 Discharge date: 06/09/2013  Time spent: 40 minutes  Recommendations for Outpatient Follow-up:  1. Pt has appointment [facility] 06/15/13. Recommend bmet to evaluate potassium level.   Discharge Diagnoses:  Principal Problem:   Chest pain Active Problems:   Obesity   Schizoaffective disorder   Noncompliance   Hypertension   Atrial fibrillation with rapid ventricular response   Hypokalemia Acute on chronic diastolic CHF  Discharge Condition: stable  Diet recommendation: heart healthy  Filed Weights   06/08/13 2008 06/09/13 0500 06/09/13 0548  Weight: 144 lb 3.2 oz (65.409 kg) 319 lb 10.7 oz (145 kg) 319 lb 10.7 oz (145 kg)    History of present illness:  Todd Page is a 62 y.o. male. Morbidly obese middle-aged Caucasian gentleman with multiple medical problems  but well known to the emergency room and the hospitalist service for his noncompliance. presented to the emergency room yet again on 06/08/13 complaining of shortness of breath. The patient was nontoxic appearing but because of the complaint of chest pain there was a concern for acute coronary syndrome, after many episodes of crying wolf. The patient was admitted on observation to rule out acute coronary syndrome.  The patient's principal complaint on the ward was difficulty breathing, although he was sitting comfortable and in no distress; he was no longer having chest pain. He was unable to give a clear explanation of why he doesn't have primary care physician, but indicated that he was referred to a primary care provider on the corner of a [address] and [employer], told him that she was not eligible to prescribe. He therefore thought there was no point following up with her . Denies dizziness or diaphoresis denies syncope . His BNP is close to baseline level      Hospital Course:  Chest  pain: atypical. Admitted to tele for observation to rule out. No further episodes of chest pain. Troponin neg. EKG without evidence of ischemia. Lasix given IV for 2 doses.   Active Problems:  Hypokalemia: related to diuretics.Pt given extra dose of potassium and he will continue home dose. Recommend BMET on 06/15/13 with PCP. No events on tele.  Acute on chronic diastolic CHF: improved on admitting exam.Chest xray stable right basilar opacity consistent with subsegmental atelectasis or scarring. Oxygen saturation level >90% on room air at rest and with ambulation. No respiratory distress. Lasix given IV x 2 doses.     Hypertension: controlled.At discharge lasix back to po. Continue lisinopril   Atrial fibrillation with rapid ventricular response: rate control adequate. Continue cardizem. Not on anticoagulant due to non-compliance  Obesity: BMI 42.7. Has been evaluated by nutritional services several times. Counseled today regarding previous recommendations.   Schizoaffective disorder: appears stable at baseline.  Noncompliance   Procedures:  none  Consultations:  none  Discharge Exam: Filed Vitals:   06/09/13 1412  BP: 126/80  Pulse: 95  Temp: 97.6 F (36.4 C)  Resp: 20    General: obese sitting in chair NAD Cardiovascular: irregularly irregular. No MGR 1+LE edema with chronic venous changes. Right shin with water blister Respiratory: normal effort BS clear bilaterally no wheeze no crackles  Discharge Instructions     Medication List         ALPRAZolam 0.25 MG tablet  Commonly known as:  XANAX  Take 1 tablet (0.25 mg total) by mouth 2 (two) times daily  as needed for anxiety or sleep.     aspirin 81 MG tablet  Take 1 tablet (81 mg total) by mouth daily.     diltiazem 360 MG 24 hr capsule  Commonly known as:  CARDIZEM CD  Take 1 capsule (360 mg total) by mouth daily.     furosemide 40 MG tablet  Commonly known as:  LASIX  Take 1 tablet (40 mg total) by mouth 2  (two) times daily.     HYDROcodone-acetaminophen 5-325 MG per tablet  Commonly known as:  NORCO/VICODIN  Take 1-2 tablets by mouth every 4 (four) hours as needed.     lisinopril 10 MG tablet  Commonly known as:  PRINIVIL,ZESTRIL  Take 1 tablet (10 mg total) by mouth daily.     Potassium Chloride ER 20 MEQ Tbcr  Take 40 mEq by mouth daily.     VITAMIN B 12 PO  Take 1 tablet by mouth daily.       No Known Allergies     Follow-up Information   Follow up On 06/15/2013. (at 9:30)    Contact information:   [facility] [phone]       The results of significant diagnostics from this hospitalization (including imaging, microbiology, ancillary and laboratory) are listed below for reference.    Significant Diagnostic Studies: Dg Chest 2 View  06/08/2013   *RADIOLOGY REPORT*  Clinical Data: Shortness of breath  CHEST - 2 VIEW  Comparison: June 06, 2013.  Findings: Stable mild cardiomegaly.  Elevated right hemidiaphragm is again noted and unchanged compared to prior exam.  Left lung is clear.  Linear density is noted in right lung base most consistent with subsegmental atelectasis or scarring.  This is unchanged compared to prior exam.  No pleural effusion or pneumothorax is noted.  IMPRESSION: Stable right basilar opacity consistent with subsegmental atelectasis or scarring.   Original Report Authenticated By: Lupita Raider.,  M.D.   Dg Chest 2 View  06/06/2013   *RADIOLOGY REPORT*  Clinical Data: Chest pain and short of breath  CHEST - 2 VIEW  Comparison: 05/25/2013  Findings: Cardiac enlargement without heart failure.  Increase in bibasilar atelectasis/infiltrate.  No significant effusion. Negative for edema.  IMPRESSION: Increased bibasilar atelectasis/infiltrate.   Original Report Authenticated By: Janeece Riggers, M.D.   Dg Chest 2 View  05/25/2013   *RADIOLOGY REPORT*  Clinical Data: Shortness of breath  CHEST - 2 VIEW  Comparison: 05/18/2013  Findings: Lung volumes are low with  crowding of the bronchovascular markings.  Curvilinear bilateral lower lobe presumed atelectasis noted.  No pleural effusion.  Degenerative changes are noted in the spine without acute osseous abnormality. Moderate enlargement of the cardiac silhouette is re-identified with central vascular congestion.  IMPRESSION: Low lung volumes  with presumed bibasilar atelectasis or scarring.   Original Report Authenticated By: Christiana Pellant, M.D.   Dg Ankle Complete Left  05/25/2013   *RADIOLOGY REPORT*  Clinical Data: Left lateral malleolus pain with palpitations. History of gout.  No history of injury.  LEFT ANKLE COMPLETE - 3+ VIEW  Comparison: Left ankle radiographs 08/05/2012.  Findings: Mild soft tissue swelling around the ankle both medially and laterally.  No acute displaced fracture, subluxation or dislocation.  No soft tissue calcifications.  No definite bony erosions.  There is an irregular area of sclerosis with central lucency in the posterior aspect of the calcaneus, likely related to chronic degenerative changes at the insertion point of the Achilles tendon.  IMPRESSION: 1.  Soft tissue  swelling surrounding the ankle joint, without underlying acute bony abnormality.   Original Report Authenticated By: Trudie Reed, M.D.   US Venous Img Lower Bilateral  05/25/2013   *RADIOLOGY REPORT*  Clinical Data: Bilateral leg tingling  VENOUS DUPLEX ULTRASOUND OF BILATERAL LOWER EXTREMITIES  Technique:  Gray-scale sonography with graded compression, as well as color Doppler and duplex ultrasound, were performed to evaluate the deep venous system of both lower extremities from the level of the common femoral vein through the popliteal and proximal calf veins.  Spectral Doppler was utilized to evaluate flow at rest and with distal augmentation maneuvers.  Comparison:  None.  Findings: Ultrasound examination bilateral lower extremity demonstrate the visualized deep veins to be patent with good augmentation and  compressibility noted.  There is no evidence of deep vein thrombosis.  No evidence of superficial vein thrombosis. Subcutaneous edema noted bilateral calfs.  IMPRESSION: No evidence of deep vein thrombosis bilateral lower extremity.   Original Report Authenticated By: Natasha Mead, M.D.   Dg Chest Port 1 View  05/18/2013   *RADIOLOGY REPORT*  Clinical Data: Chest pain and shortness of breath.  PORTABLE CHEST - 1 VIEW  Comparison: Chest x-ray 02/17/2013.  Findings: Lung volumes are low.  The film is under penetrated, which limits the diagnostic sensitivity and specificity of this examination.  With this limitation in mind, there appears to be opacification of the base of the left hemithorax which may represent atelectasis and/or consolidation.  Probable trace bilateral pleural effusions.  There is cephalization of the pulmonary vasculature and slight indistinctness of the interstitial markings suggestive of mild pulmonary edema.  Mild cardiomegaly. The patient is rotated to the left on today's exam, resulting in distortion of the mediastinal contours and reduced diagnostic sensitivity and specificity for mediastinal pathology.  IMPRESSION: 1.  Findings are concerning for mild congestive heart failure with trace bilateral pleural effusions. 2.  Possible area of atelectasis and/or consolidation in the left lower lobe.   Original Report Authenticated By: Trudie Reed, M.D.    Microbiology: No results found for this or any previous visit (from the past 240 hour(s)).   Labs: Basic Metabolic Panel:  Recent Labs Lab 06/06/13 0830 06/08/13 1640 06/09/13 0513  NA 142 141 140  K 4.0 3.5 3.0*  CL 105 104 101  CO2 28 29 28   GLUCOSE 122* 97 125*  BUN 22 19 17   CREATININE 1.12 1.27 1.28  CALCIUM 9.3 9.1 9.3   Liver Function Tests:  Recent Labs Lab 06/06/13 0830 06/08/13 1640  AST 15 18  ALT 18 22  ALKPHOS 50 48  BILITOT 0.3 0.5  PROT 6.4 6.7  ALBUMIN 3.4* 3.6   No results found for this  basename: LIPASE, AMYLASE,  in the last 168 hours No results found for this basename: AMMONIA,  in the last 168 hours CBC:  Recent Labs Lab 06/06/13 0830 06/08/13 1640 06/09/13 0513  WBC 8.7 8.5 7.9  NEUTROABS 6.4  --   --   HGB 14.6 15.0 15.3  HCT 44.6 46.3 47.1  MCV 81.5 81.4 80.7  PLT 199 222 229   Cardiac Enzymes:  Recent Labs Lab 06/06/13 0830 06/08/13 2256 06/09/13 0513 06/09/13 1047  TROPONINI <0.30 <0.30 <0.30 <0.30   BNP: BNP (last 3 results)  Recent Labs  05/18/13 1023 06/06/13 0830 06/08/13 1640  PROBNP 1443.0* 1214.0* 1162.0*   CBG: No results found for this basename: GLUCAP,  in the last 168 hours     Signed:  Gwenyth Bender  [facility] 06/09/2013, 3:12 PM  Attending note:  Patient seen and examined. Above note reviewed. Patient was admitted to the hospital with acute on chronic diastolic congestive heart failure, chest pain. He has chronic atrial fibrillation. Patient is very noncompliant and has required frequent hospitalizations for shortness of breath/age fibrillation without ventricular response. At this time, he appears to clinically improve. He does not wanders appears to be short of breath. He was ambulated without any difficulty and maintained his saturations greater than 90%. His lower extremity edema appears to be improved from his regular baseline. Heart rate appears to be controlled. He is not a candidate for Coumadin due to noncompliance. He was strongly advised regarding the importance of medication compliance. He is felt stable to discharge home today.  Thomos Domine

## 2013-06-09 NOTE — Clinical Social Work Psychosocial (Signed)
Clinical Social Work Department BRIEF PSYCHOSOCIAL ASSESSMENT 06/09/2013  Patient:  Todd Page, Todd Page     Account Number:  [id number]     Admit date:  06/08/2013  Clinical Social Worker:  Nancie Neas  Date/Time:  06/09/2013 11:35 AM  Referred by:  Physician  Date Referred:  06/09/2013 Referred for  Psychosocial assessment   Other Referral:   Interview type:  Patient Other interview type:    PSYCHOSOCIAL DATA Living Status:  FAMILY Admitted from facility:   Level of care:   Primary support name:  [name] Primary support relationship to patient:  PARENT Degree of support available:   limited family support    CURRENT CONCERNS Current Concerns  Other - See comment   Other Concerns:   "Things are not going well at home."    SOCIAL WORK ASSESSMENT / PLAN CSW met with pt at bedside following referral from PA as pt states, "Things are not going well at home." Pt alert and oriented and very well known to CSW. He was admitted and d/c twice in the past two weeks. Pt was agreeable to ALF the first time and the process was started. However, he then refused to apply for Medicaid and did not want to pursue placement. Pt d/c home. When asked about how things were at home, he said that there are issues with his siblings. Pt did not want to go into any detail. He indicates he is physically fine at home and his mother is there as well who does "good for her age." Pt was set up with appointment at [facility] but said he did not go because there was only a PA there and he didn't think she could write scripts. CM clarified this issue with pt. Pt uses [facility] for transport. CSW asked if there was anything that we could assist with concerning his siblings. Pt said he didn't think there was anything that could be done, it is just relational issues. He states he plans to continue to table Medicaid and ALF discussions.   Assessment/plan status:  No Further Intervention Required Other assessment/ plan:    Information/referral to community resources:   n/a    PATIENT'S/FAMILY'S RESPONSE TO PLAN OF CARE: Pt said he does not feel there is anything CSW can assist with at home. He plans to d/c home when ready. CSW will sign off but can be reconsulted if needed.       Derenda Fennel, [place] [phone]

## 2013-06-09 NOTE — Progress Notes (Signed)
UR chart review completed.  

## 2013-06-09 NOTE — Discharge Summary (Signed)
Pt stated he was ready to go home and pain was under control.  Pt was given DC instructions and a cab was called on his behalf to get home.  Pt's IV was removed prior to DC and he was wheeled to ER entrance to meet cab.

## 2013-06-09 NOTE — Clinical Social Work Note (Signed)
CSW received referral for PCP. Discussed with CM who reports that pt had appointment for [facility] week. Will sign off but can be reconsulted if needed.   Derenda Fennel, [place] [phone]

## 2013-06-09 NOTE — Care Management Note (Signed)
    Page 1 of 1   06/09/2013     10:36:32 AM   CARE MANAGEMENT NOTE 06/09/2013  Patient:  Todd Page, Todd Page   Account Number:  [id number]  Date Initiated:  06/09/2013  Documentation initiated by:  Sharrie Rothman  Subjective/Objective Assessment:   Pt admitted from home with CP. Pt lives with his mother and will return home at discharge. Pt was arranged HH with [facility] at discharge and [facility] notified CM that pt refused when [facility] visited pt. Pt also stated that he went to his PCP appt at     Action/Plan:   [facility] and stated that they would not write prescriptions so he did not stay for appt. Another appt made with clinic and pt aware of appt time and documented on AVS. Pt very noncompliant with medication or followup.   Anticipated DC Date:  06/09/2013   Anticipated DC Plan:  HOME/SELF CARE      DC Planning Services  CM consult      Choice offered to / List presented to:             Status of service:  Completed, signed off Medicare Important Message given?   (If response is "NO", the following Medicare IM given date fields will be blank) Date Medicare IM given:   Date Additional Medicare IM given:    Discharge Disposition:  HOME/SELF CARE  Per UR Regulation:    If discussed at Long Length of Stay Meetings, dates discussed:    Comments:  06/09/13 1035 Arlyss Queen, RN BSN CM

## 2013-08-17 ENCOUNTER — Encounter (HOSPITAL_COMMUNITY): Payer: Self-pay | Admitting: Emergency Medicine

## 2013-08-17 ENCOUNTER — Emergency Department (HOSPITAL_COMMUNITY)
Admission: EM | Admit: 2013-08-17 | Discharge: 2013-08-17 | Disposition: A | Discharge status: Home / Self Care | Payer: Medicare Other | Attending: Emergency Medicine | Admitting: Emergency Medicine

## 2013-08-17 DIAGNOSIS — I509 Heart failure, unspecified: Secondary | ICD-10-CM | POA: Insufficient documentation

## 2013-08-17 DIAGNOSIS — Z8719 Personal history of other diseases of the digestive system: Secondary | ICD-10-CM | POA: Insufficient documentation

## 2013-08-17 DIAGNOSIS — Z91199 Patient's noncompliance with other medical treatment and regimen due to unspecified reason: Secondary | ICD-10-CM | POA: Insufficient documentation

## 2013-08-17 DIAGNOSIS — Z79899 Other long term (current) drug therapy: Secondary | ICD-10-CM | POA: Insufficient documentation

## 2013-08-17 DIAGNOSIS — L819 Disorder of pigmentation, unspecified: Secondary | ICD-10-CM | POA: Insufficient documentation

## 2013-08-17 DIAGNOSIS — I10 Essential (primary) hypertension: Secondary | ICD-10-CM | POA: Insufficient documentation

## 2013-08-17 DIAGNOSIS — Z7982 Long term (current) use of aspirin: Secondary | ICD-10-CM | POA: Insufficient documentation

## 2013-08-17 DIAGNOSIS — Z862 Personal history of diseases of the blood and blood-forming organs and certain disorders involving the immune mechanism: Secondary | ICD-10-CM | POA: Insufficient documentation

## 2013-08-17 DIAGNOSIS — I4891 Unspecified atrial fibrillation: Secondary | ICD-10-CM | POA: Insufficient documentation

## 2013-08-17 DIAGNOSIS — Z781 Physical restraint status: Secondary | ICD-10-CM | POA: Insufficient documentation

## 2013-08-17 DIAGNOSIS — Z8659 Personal history of other mental and behavioral disorders: Secondary | ICD-10-CM | POA: Insufficient documentation

## 2013-08-17 DIAGNOSIS — E876 Hypokalemia: Secondary | ICD-10-CM | POA: Insufficient documentation

## 2013-08-17 DIAGNOSIS — R609 Edema, unspecified: Secondary | ICD-10-CM

## 2013-08-17 DIAGNOSIS — Z8639 Personal history of other endocrine, nutritional and metabolic disease: Secondary | ICD-10-CM | POA: Insufficient documentation

## 2013-08-17 DIAGNOSIS — Z9119 Patient's noncompliance with other medical treatment and regimen: Secondary | ICD-10-CM | POA: Insufficient documentation

## 2013-08-17 DIAGNOSIS — R5381 Other malaise: Secondary | ICD-10-CM

## 2013-08-17 HISTORY — DX: Heart failure, unspecified: I50.9

## 2013-08-17 LAB — COMPREHENSIVE METABOLIC PANEL
AST: 33 U/L (ref 0–37)
Alkaline Phosphatase: 55 U/L (ref 39–117)
CO2: 29 mEq/L (ref 19–32)
Chloride: 97 mEq/L (ref 96–112)
Creatinine, Ser: 1.32 mg/dL (ref 0.50–1.35)
GFR calc non Af Amer: 57 mL/min — ABNORMAL LOW (ref >90)
Potassium: 2.9 mEq/L — ABNORMAL LOW (ref 3.5–5.1)
Total Bilirubin: 1.3 mg/dL — ABNORMAL HIGH (ref 0.3–1.2)

## 2013-08-17 LAB — CBC WITH DIFFERENTIAL/PLATELET
Basophils Absolute: 0 10*3/uL (ref 0.0–0.1)
Basophils Relative: 0 % (ref 0–1)
Eosinophils Absolute: 0.1 10*3/uL (ref 0.0–0.7)
Eosinophils Relative: 1 % (ref 0–5)
Lymphs Abs: 1.1 10*3/uL (ref 0.7–4.0)
MCH: 27.7 pg (ref 26.0–34.0)
MCHC: 34.8 g/dL (ref 30.0–36.0)
MCV: 79.4 fL (ref 78.0–100.0)
Neutrophils Relative %: 79 % — ABNORMAL HIGH (ref 43–77)
Platelets: 222 10*3/uL (ref 150–400)
RBC: 6.4 MIL/uL — ABNORMAL HIGH (ref 4.22–5.81)
RDW: 14.7 % (ref 11.5–15.5)

## 2013-08-17 MED ORDER — POTASSIUM CHLORIDE CRYS ER 20 MEQ PO TBCR
40.0000 meq | EXTENDED_RELEASE_TABLET | Freq: Once | ORAL | Status: AC
  Administered 2013-08-17: 40 meq via ORAL
  Filled 2013-08-17: qty 2

## 2013-08-17 NOTE — ED Notes (Signed)
In to dc pt home and pt states, " I can't go home I can't walk. What am I suppose to do? Will inform MD that pt is requesting not to be dc home.

## 2013-08-17 NOTE — Progress Notes (Signed)
Pt complained to Dr Bebe Shaggy that he could not walk, but when gotten up by MD able to walk, so HH ordered to assist for strengthening, exercises, etc. Pt has been set up with [facility] before and agrees to having them again. HH/PT set up

## 2013-08-17 NOTE — ED Notes (Signed)
Dr. Bebe Shaggy in and spoke with pt concerning dc home and home health to see pt at home. Per Dr. Bebe Shaggy pt was up in room taking steps. Pt refusing to sign e-sig at time of dc. nad noted prior to dc. Dc instructions reviewed. Pt voiced that he understood home health would come to house for evaluation and PT

## 2013-08-17 NOTE — ED Notes (Signed)
Patient unable to ambulate with 2 people assisting him.  Patient hyperventilated and could not put one foot in front of the other.

## 2013-08-17 NOTE — Progress Notes (Signed)
ED/CM noted patient did not have health insurance and/or PCP listed in the computer.  Patient was given the [facility] with information on the clinics, food pantries, and the handout for new health insurance sign-up.  Patient expressed appreciation for this. Pt states he had appointment at the [facility], but did not keep this appt because he thought the person there could not write Rx, or do other things that a MD could do. Reassured him that there are PAs and NP at the clinic and MDs supervising them and they are physician extenders who function as doctors, and can do almost everything a doctor can do. He states he does not want to go there. The list of the other new MDs who are taking new patients was given, and he is instructed to make follow up appointment at his convenience, because he needs a PCP.

## 2013-08-17 NOTE — ED Notes (Signed)
Pt states throbbing to bilateral feet x 3 days. States he has needed assistance ambulating at times, using a wheelchair and other people for help getting around. Lower legs and feet are red and warm to the touch. Pedal pulse present bilat. Pt denies pain to the touch.

## 2013-08-17 NOTE — ED Provider Notes (Signed)
CSN: [id number]     Arrival date & time 08/17/13  1149 History  This chart was scribed for Joya Gaskins, MD by Bennett Scrape, ED Scribe. This patient was seen in room APA04/APA04 and the patient's care was started at 1:17 PM.   Chief Complaint  Patient presents with  . Leg Swelling    Patient is a 62 y.o. male presenting with leg pain. The history is provided by the patient. No language interpreter was used.  Leg Pain Location:  Leg Time since incident:  3 days Leg location:  L lower leg and R lower leg Pain details:    Radiates to:  Back   Timing:  Intermittent   Progression:  Unchanged Chronicity:  New Dislocation: no   Foreign body present:  No foreign bodies Prior injury to area:  No Worsened by:  Bearing weight Associated symptoms: swelling   Associated symptoms: no fever and no numbness     HPI Comments: Todd Page is a 62 y.o. male who presents to the Emergency Department complaining of gradual onset, gradually worsening, constant leg swelling for the past 3 days. The swelling starts above his feet and radiates up to his bilateral knees. Marland Kitchen He reports decreased ambulation secondary to pain. He admits that he has need assistance with walking and has been using a wheelchair since the onset due to the pain. At baseline, he states that he can ambulate on his own. He reports redness to bilateral lower extremities but states that this has been present "for a while" and denies any changes. He denies any prior episodes of swelling or leg pain. He denies any weakness or numbness, fevers, CP or SOB, abdominal swelling or upper extremity swelling as associated symptoms  No PCP currently. Last admission was Aug 2014.  Past Medical History  Diagnosis Date  . Hypertension     minimal coronary atherosclerosis by CT  . Schizoaffective disorder     With depression  . Atrial fibrillation 06/2012    onset 06/2012; normal TSH; EF-45%  . Overweight(278.02)   . Gout   . Noncompliance  09/21/2012  . Cholelithiasis     incidental finding on CT in 2013  . Systolic dysfunction   . A-fib   . CHF (congestive heart failure)    Past Surgical History  Procedure Laterality Date  . Colonoscopy  2006    normal screening examination   Family History  Problem Relation Age of Onset  . Hypertension Mother   . Liver disease Father    History  Substance Use Topics  . Smoking status: Never Smoker   . Smokeless tobacco: Never Used  . Alcohol Use: No     Comment: former     Review of Systems  Constitutional: Negative for fever.  Respiratory: Negative for shortness of breath.   Cardiovascular: Positive for leg swelling. Negative for chest pain.  Skin: Positive for color change (chronic).  All other systems reviewed and are negative.    Allergies  Review of patient's allergies indicates no known allergies.  Home Medications   Current Outpatient Rx  Name  Route  Sig  Dispense  Refill  . ALPRAZolam (XANAX) 0.25 MG tablet   Oral   Take 1 tablet (0.25 mg total) by mouth 2 (two) times daily as needed for anxiety or sleep.   30 tablet   0   . aspirin 81 MG tablet   Oral   Take 1 tablet (81 mg total) by mouth daily.   30  tablet   1   . Cyanocobalamin (VITAMIN B 12 PO)   Oral   Take 1 tablet by mouth daily.         Marland Kitchen diltiazem (CARDIZEM CD) 360 MG 24 hr capsule   Oral   Take 1 capsule (360 mg total) by mouth daily.   30 capsule   0   . furosemide (LASIX) 40 MG tablet   Oral   Take 1 tablet (40 mg total) by mouth 2 (two) times daily.   60 tablet   0   . HYDROcodone-acetaminophen (NORCO/VICODIN) 5-325 MG per tablet   Oral   Take 1-2 tablets by mouth every 4 (four) hours as needed.   30 tablet   0   . lisinopril (PRINIVIL,ZESTRIL) 10 MG tablet   Oral   Take 1 tablet (10 mg total) by mouth daily.   30 tablet   0   . Potassium Chloride ER 20 MEQ TBCR   Oral   Take 40 mEq by mouth daily.   60 tablet   0    Triage Vitals: BP 128/94  Pulse 110   Resp 18  Ht 6' (1.829 m)  Wt 295 lb (133.811 kg)  BMI 40 kg/m2  SpO2 97%  Physical Exam  Nursing note and vitals reviewed.  CONSTITUTIONAL: Well developed/well nourished HEAD: Normocephalic/atraumatic EYES: EOMI/PERRL ENMT: Mucous membranes moist NECK: supple no meningeal signs SPINE:entire spine nontender CV: S1/S2 noted, no murmurs/rubs/gallops noted LUNGS: Lungs are clear to auscultation bilaterally, no apparent distress ABDOMEN: soft, nontender, no rebound or guarding GU:no cva tenderness NEURO: Pt is awake/alert, moves all extremitiesx4. No focal weakness noted in any of his extremities EXTREMITIES: pulses normal, full ROM, mild symmetric pitting edema to lower extremities, scattered patches of erythema that are chronic, no open wounds, pulses are intact No focal tenderness noted to either extremity.  No signs of trauma SKIN: warm, color normal PSYCH: flat affect  ED Course  Procedures (including critical care time)  DIAGNOSTIC STUDIES: Oxygen Saturation is 97% on room air, normal by my interpretation.    COORDINATION OF CARE: 1:18 PM-Discussed treatment plan which includes CBC panel and CMP with pt at bedside and pt agreed to plan.    3:42 PM Pt with mild hypokalemia, but does not have med list available He has only minimal symmetric LE edema.  No signs of cellultis.   Doubt DVT.   Initial told nursing he could not walk I personally ambulated patient and he can ambulate without ataxia I feel he is safe for d/c home He was seen by care management.  PT has been arranged as outpatient Advised need for PCP followup No signs of acute CHF (has h/o chronic CHF)  EKG Interpretation   None       MDM  No diagnosis found. Nursing notes including past medical history and social history reviewed and considered in documentation Labs/vital reviewed and considered   I personally performed the services described in this documentation, which was scribed in my presence.  The recorded information has been reviewed and is accurate.       Joya Gaskins, MD 08/17/13 [phone]

## 2013-08-17 NOTE — ED Notes (Signed)
Progressive bilateral LE swelling x 3 days w/tenderness as well.  Unable to ambulate w/out assistance.  Bilateral LE red from just below patella downward.

## 2013-09-25 ENCOUNTER — Emergency Department (HOSPITAL_COMMUNITY)
Admission: EM | Admit: 2013-09-25 | Discharge: 2013-09-26 | Disposition: A | Discharge status: Home / Self Care | Payer: Medicare Other | Attending: Emergency Medicine | Admitting: Emergency Medicine

## 2013-09-25 ENCOUNTER — Encounter (HOSPITAL_COMMUNITY): Payer: Self-pay | Admitting: Emergency Medicine

## 2013-09-25 ENCOUNTER — Emergency Department (HOSPITAL_COMMUNITY): Payer: Medicare Other

## 2013-09-25 DIAGNOSIS — I10 Essential (primary) hypertension: Secondary | ICD-10-CM | POA: Insufficient documentation

## 2013-09-25 DIAGNOSIS — I509 Heart failure, unspecified: Secondary | ICD-10-CM | POA: Insufficient documentation

## 2013-09-25 DIAGNOSIS — I4891 Unspecified atrial fibrillation: Secondary | ICD-10-CM

## 2013-09-25 DIAGNOSIS — Z8659 Personal history of other mental and behavioral disorders: Secondary | ICD-10-CM | POA: Insufficient documentation

## 2013-09-25 DIAGNOSIS — Z8639 Personal history of other endocrine, nutritional and metabolic disease: Secondary | ICD-10-CM | POA: Insufficient documentation

## 2013-09-25 DIAGNOSIS — Z862 Personal history of diseases of the blood and blood-forming organs and certain disorders involving the immune mechanism: Secondary | ICD-10-CM | POA: Insufficient documentation

## 2013-09-25 DIAGNOSIS — Z9119 Patient's noncompliance with other medical treatment and regimen: Secondary | ICD-10-CM | POA: Insufficient documentation

## 2013-09-25 DIAGNOSIS — Z8719 Personal history of other diseases of the digestive system: Secondary | ICD-10-CM | POA: Insufficient documentation

## 2013-09-25 DIAGNOSIS — Z91199 Patient's noncompliance with other medical treatment and regimen due to unspecified reason: Secondary | ICD-10-CM | POA: Insufficient documentation

## 2013-09-25 DIAGNOSIS — E663 Overweight: Secondary | ICD-10-CM | POA: Insufficient documentation

## 2013-09-25 LAB — PRO B NATRIURETIC PEPTIDE: Pro B Natriuretic peptide (BNP): 1783 pg/mL — ABNORMAL HIGH (ref 0–125)

## 2013-09-25 LAB — CBC WITH DIFFERENTIAL/PLATELET
Basophils Relative: 1 % (ref 0–1)
Eosinophils Absolute: 0.2 10*3/uL (ref 0.0–0.7)
Lymphocytes Relative: 13 % (ref 12–46)
Lymphs Abs: 1 10*3/uL (ref 0.7–4.0)
Monocytes Absolute: 0.6 10*3/uL (ref 0.1–1.0)
Neutro Abs: 5.8 10*3/uL (ref 1.7–7.7)
Neutrophils Relative %: 76 % (ref 43–77)
Platelets: 225 10*3/uL (ref 150–400)
RBC: 4.92 MIL/uL (ref 4.22–5.81)
RDW: 16.4 % — ABNORMAL HIGH (ref 11.5–15.5)
WBC: 7.7 10*3/uL (ref 4.0–10.5)

## 2013-09-25 LAB — COMPREHENSIVE METABOLIC PANEL
Albumin: 3.7 g/dL (ref 3.5–5.2)
BUN: 18 mg/dL (ref 6–23)
Chloride: 103 mEq/L (ref 96–112)
Creatinine, Ser: 1.14 mg/dL (ref 0.50–1.35)
Total Bilirubin: 0.9 mg/dL (ref 0.3–1.2)

## 2013-09-25 LAB — TROPONIN I: Troponin I: <0.3 ng/mL (ref <0.30)

## 2013-09-25 MED ORDER — POTASSIUM CHLORIDE ER 10 MEQ PO TBCR: 10.0000 meq | EXTENDED_RELEASE_TABLET | Freq: Two times a day (BID) | ORAL | Status: DC

## 2013-09-25 MED ORDER — DEXTROSE 5 % IV SOLN
5.0000 mg/h | Freq: Once | INTRAVENOUS | Status: AC
  Administered 2013-09-25: 5 mg/h via INTRAVENOUS
  Filled 2013-09-25: qty 100

## 2013-09-25 MED ORDER — LISINOPRIL 10 MG PO TABS: 10.0000 mg | ORAL_TABLET | Freq: Every day | ORAL | Status: DC

## 2013-09-25 MED ORDER — ALBUTEROL SULFATE (5 MG/ML) 0.5% IN NEBU
5.0000 mg | INHALATION_SOLUTION | Freq: Once | RESPIRATORY_TRACT | Status: AC
  Administered 2013-09-25: 5 mg via RESPIRATORY_TRACT
  Filled 2013-09-25: qty 1

## 2013-09-25 MED ORDER — IPRATROPIUM BROMIDE 0.02 % IN SOLN
0.5000 mg | Freq: Once | RESPIRATORY_TRACT | Status: AC
  Administered 2013-09-25: 0.5 mg via RESPIRATORY_TRACT
  Filled 2013-09-25: qty 2.5

## 2013-09-25 MED ORDER — DILTIAZEM HCL ER COATED BEADS 240 MG PO CP24
240.0000 mg | ORAL_CAPSULE | Freq: Every day | ORAL | Status: DC
  Administered 2013-09-25: 240 mg via ORAL
  Filled 2013-09-25 (×3): qty 1

## 2013-09-25 MED ORDER — FUROSEMIDE 10 MG/ML IJ SOLN
60.0000 mg | Freq: Once | INTRAMUSCULAR | Status: AC
  Administered 2013-09-25: 60 mg via INTRAVENOUS
  Filled 2013-09-25: qty 6

## 2013-09-25 MED ORDER — FUROSEMIDE 40 MG PO TABS: 20.0000 mg | ORAL_TABLET | Freq: Two times a day (BID) | ORAL | Status: DC

## 2013-09-25 MED ORDER — DILTIAZEM HCL ER COATED BEADS 240 MG PO CP24
ORAL_CAPSULE | ORAL | Status: AC
  Filled 2013-09-25: qty 1

## 2013-09-25 MED ORDER — DILTIAZEM HCL ER COATED BEADS 360 MG PO CP24: 360.0000 mg | ORAL_CAPSULE | Freq: Every day | ORAL | Status: DC

## 2013-09-25 MED ORDER — DILTIAZEM HCL 25 MG/5ML IV SOLN
5.0000 mg | Freq: Once | INTRAVENOUS | Status: AC
  Administered 2013-09-25: 5 mg via INTRAVENOUS
  Filled 2013-09-25: qty 5

## 2013-09-25 MED ORDER — NITROGLYCERIN 2 % TD OINT
1.0000 [in_us] | TOPICAL_OINTMENT | Freq: Once | TRANSDERMAL | Status: AC
  Administered 2013-09-25: 1 [in_us] via TOPICAL
  Filled 2013-09-25: qty 1

## 2013-09-25 NOTE — ED Notes (Signed)
Walked by pts room and pt was sleeping but slumped over in stretcher and o2 was 86%. Pulse ox not exactly in the right spot on his finger. Lawson Fiscal, RN and Liberty Handy, RN repositioned pt in bed with the help of the pt. Pt moved up in bed and sitting up higher. Sticky pulse ox placed on finger to get a good reading. Pt went back to sleep and o2 is 98% on 2L.

## 2013-09-25 NOTE — ED Notes (Signed)
AS per conversation with Dr Lynelle Doctor about the breathing treatment increasing the pt's heart rate, we'll hold the breathing treatment until Cardizem drip is started.

## 2013-09-25 NOTE — ED Notes (Signed)
Pt complain of dizziness for three days. States he has not felt well for a while. Pt hyperventilating on arrival to ED with a throaty wheeze

## 2013-09-25 NOTE — ED Provider Notes (Signed)
CSN: [id number]     Arrival date & time 09/25/13  1658 History   First MD Initiated Contact with Patient 09/25/13 1713     This chart was scribed for Ward Givens, MD by Arlan Organ, ED Scribe. This patient was seen in room APA02/APA02 and the patient's care was started 5:16 PM.   Chief Complaint  Patient presents with  . Dizziness   The history is provided by the patient. No language interpreter was used.   HPI Comments: Todd Page is a 62 y.o. male who presents to the Emergency Department complaining of intermittent SOB that started 3 days ago, but has worsened in the last 2 days. He denies having SOB at night time when he lays down or when he walks. Pt also reports intermittent swelling in his legs which he says started about 1 year ago. He says sometimes he feels "hot", and feels it may be related to his BP elevating. Pt denies cough, fever, CP, abdominal pain or swelling/distention. He denies being on oxygen at home. Pt denies tobacco use, but reports consuming alcohol occasionally. He is on disability for related heart issues. Pt says he does not currently have a PCP, and states he currently does not take any medication.  PCP none  Past Medical History  Diagnosis Date  . Hypertension     minimal coronary atherosclerosis by CT  . Schizoaffective disorder     With depression  . Atrial fibrillation 06/2012    onset 06/2012; normal TSH; EF-45%  . Overweight(278.02)   . Gout   . Noncompliance 09/21/2012  . Cholelithiasis     incidental finding on CT in 2013  . Systolic dysfunction   . A-fib   . CHF (congestive heart failure)    Past Surgical History  Procedure Laterality Date  . Colonoscopy  2006    normal screening examination   Family History  Problem Relation Age of Onset  . Hypertension Mother   . Liver disease Father    History  Substance Use Topics  . Smoking status: Never Smoker   . Smokeless tobacco: Never Used  . Alcohol Use: No     Comment: former   lives at  home Lives with mother  Review of Systems  Neurological: Positive for dizziness.  All other systems reviewed and are negative.    Allergies  Review of patient's allergies indicates no known allergies.  Home Medications   Current Outpatient Rx  Name  Route  Sig  Dispense  Refill  . naproxen sodium (ALEVE) 220 MG tablet   Oral   Take 440 mg by mouth daily as needed (for pain).           Triage Vitals: BP 162/122  Pulse 121  Temp(Src) 97.9 F (36.6 C) (Oral)  Resp 28  Ht 6' (1.829 m)  Wt 300 lb (136.079 kg)  BMI 40.68 kg/m2  SpO2 94%  Vital signs normal except tachycardia   Physical Exam  Nursing note and vitals reviewed. Constitutional: He is oriented to person, place, and time. He appears well-developed and well-nourished.  Non-toxic appearance. He does not appear ill. No distress.  HENT:  Head: Normocephalic and atraumatic.  Right Ear: External ear normal.  Left Ear: External ear normal.  Nose: Nose normal. No mucosal edema or rhinorrhea.  Mouth/Throat: Oropharynx is clear and moist and mucous membranes are normal. No dental abscesses or uvula swelling.  Eyes: Conjunctivae and EOM are normal. Pupils are equal, round, and reactive to light.  Neck: Normal range of motion and full passive range of motion without pain. Neck supple.  Cardiovascular: Normal rate, regular rhythm and normal heart sounds.  Exam reveals no gallop and no friction rub.   No murmur heard. Irregular heart rhythm  Diminished breath sounds  Pulmonary/Chest: Effort normal and breath sounds normal. No respiratory distress. He has no wheezes. He has no rhonchi. He has no rales. He exhibits no tenderness and no crepitus.  Abdominal: Soft. Normal appearance and bowel sounds are normal. He exhibits no distension. There is no tenderness. There is no rebound and no guarding.  Musculoskeletal: Normal range of motion. He exhibits no edema and no tenderness.  Moves all extremities well.  Defused redness  of bilateral lower legs Warmth to lower extremities bilaterally and some open areas draining clear fluid  Neurological: He is alert and oriented to person, place, and time. He has normal strength. No cranial nerve deficit.  Skin: Skin is warm, dry and intact. No rash noted. No erythema. No pallor.  Psychiatric: He has a normal mood and affect. His speech is normal and behavior is normal. His mood appears not anxious.    ED Course  Procedures (including critical care time)  Medications  diltiazem (CARDIZEM CD) 24 hr capsule 240 mg (240 mg Oral Given 09/25/13 2202)  nitroGLYCERIN (NITROGLYN) 2 % ointment 1 inch (1 inch Topical Given 09/25/13 1759)  furosemide (LASIX) injection 60 mg (60 mg Intravenous Given 09/25/13 1759)  albuterol (PROVENTIL) (5 MG/ML) 0.5% nebulizer solution 5 mg (5 mg Nebulization Given 09/25/13 1854)  ipratropium (ATROVENT) nebulizer solution 0.5 mg (0.5 mg Nebulization Given 09/25/13 1854)  diltiazem (CARDIZEM) 100 mg in dextrose 5 % 100 mL infusion (0 mg/hr Intravenous Stopped 09/25/13 2313)  diltiazem (CARDIZEM) injection 5 mg (0 mg Intravenous Stopped 09/25/13 1800)     DIAGNOSTIC STUDIES: Oxygen Saturation is 94% on RA, Adequate by my interpretation.    COORDINATION OF CARE: 5:15 PM- Will give breathing treatment, lasix, and nitroglyn. Will order chest X-Ray, EKG, and blood work. Discussed treatment plan with pt at bedside and pt agreed to plan.     8:15 PM-  Pt BP 133/79 HR 106 while on Carzidem drip 5 mg/hr. Discussed possible admit plans with pt. Spoke to pt regarding following up with a provider. Pt states he never gets prescriptions when he is discharged from the hospital, he states he was never referred to a primary doctor, however in August he was clearly given meds and referral to Dr Adline Potter, pt gets agitated, states Dr Adline Potter is in [place], then it is a clinic, not a doctor, then states she is a NP and can't write for prescriptions.   20:40 Dr Orvan Falconer,  states he feels patient isn't sick enough to be admitted, states he is noncompliant. Feels patient can be discharge since his heart rate was never over 120.  Review of his discharge summary from August. Patient was discharged on diltiazem 360 mg 24 hour capsules daily, Lasix 40 mg twice a day, with pro 10 mg tablets daily, potassium tablets 20 mEq, 2 daily. He was also advised to take aspirin 81 mg a day. He was also given prescriptions for Xanax 0.25 mg and hydrocodone 5/325. I am not going to prescribe those.    Labs Review  Results for orders placed during the hospital encounter of 09/25/13  CBC WITH DIFFERENTIAL      Result Value Range   WBC 7.7  4.0 - 10.5 K/uL   RBC 4.92  4.22 -  5.81 MIL/uL   Hemoglobin 13.5  13.0 - 17.0 g/dL   HCT 16.1  09.6 - 04.5 %   MCV 85.4  78.0 - 100.0 fL   MCH 27.4  26.0 - 34.0 pg   MCHC 32.1  30.0 - 36.0 g/dL   RDW 40.9 (*) 81.1 - 91.4 %   Platelets 225  150 - 400 K/uL   Neutrophils Relative % 76  43 - 77 %   Neutro Abs 5.8  1.7 - 7.7 K/uL   Lymphocytes Relative 13  12 - 46 %   Lymphs Abs 1.0  0.7 - 4.0 K/uL   Monocytes Relative 7  3 - 12 %   Monocytes Absolute 0.6  0.1 - 1.0 K/uL   Eosinophils Relative 3  0 - 5 %   Eosinophils Absolute 0.2  0.0 - 0.7 K/uL   Basophils Relative 1  0 - 1 %   Basophils Absolute 0.0  0.0 - 0.1 K/uL  COMPREHENSIVE METABOLIC PANEL      Result Value Range   Sodium 142  135 - 145 mEq/L   Potassium 3.6  3.5 - 5.1 mEq/L   Chloride 103  96 - 112 mEq/L   CO2 31  19 - 32 mEq/L   Glucose, Bld 98  70 - 99 mg/dL   BUN 18  6 - 23 mg/dL   Creatinine, Ser 7.82  0.50 - 1.35 mg/dL   Calcium 9.6  8.4 - 95.6 mg/dL   Total Protein 6.9  6.0 - 8.3 g/dL   Albumin 3.7  3.5 - 5.2 g/dL   AST 26  0 - 37 U/L   ALT 28  0 - 53 U/L   Alkaline Phosphatase 61  39 - 117 U/L   Total Bilirubin 0.9  0.3 - 1.2 mg/dL   GFR calc non Af Amer 68 (*) >90 mL/min   GFR calc Af Amer 78 (*) >90 mL/min  PRO B NATRIURETIC PEPTIDE      Result Value Range    Pro B Natriuretic peptide (BNP) 1783.0 (*) 0 - 125 pg/mL  TROPONIN I      Result Value Range   Troponin I <0.30  <0.30 ng/mL   Laboratory interpretation all normal except more elevated BNP   Imaging Review Dg Chest Portable 1 View  09/25/2013   CLINICAL DATA:  Short of breath  EXAM: PORTABLE CHEST - 1 VIEW  COMPARISON:  06/08/2013  FINDINGS: Lungs are markedly under aerated. Heart is mildly enlarged. Hazy airspace disease bilaterally has developed. Bibasilar atelectasis.  IMPRESSION: Hazy bilateral airspace disease worrisome for edema.  Bibasilar atelectasis.   Electronically Signed   By: Maryclare Bean M.D.   On: 09/25/2013 17:51    EKG Interpretation    Date/Time:  Friday September 25 2013 17:00:34 EST Ventricular Rate:  102 PR Interval:    QRS Duration: 90 QT Interval:  382 QTC Calculation: 497 R Axis:   43 Text Interpretation:  Atrial fibrillation with rapid ventricular response When compared with ECG of 08-Jun-2013 15:28, QT has lengthened Confirmed by Laketta Soderberg  MD-I, Diany Formosa ([id number]) on 09/25/2013 7:40:20 PM            MDM   1. CHF (congestive heart failure)   2. Atrial fibrillation     New Prescriptions   DILTIAZEM (CARDIZEM CD) 360 MG 24 HR CAPSULE    Take 1 capsule (360 mg total) by mouth daily.   FUROSEMIDE (LASIX) 40 MG TABLET    Take 0.5 tablets (20 mg  total) by mouth 2 (two) times daily.   LISINOPRIL (ZESTRIL) 10 MG TABLET    Take 1 tablet (10 mg total) by mouth daily.   POTASSIUM CHLORIDE (K-DUR) 10 MEQ TABLET    Take 1 tablet (10 mEq total) by mouth 2 (two) times daily.    Plan discharge  Devoria Albe, MD, FACEP    I personally performed the services described in this documentation, which was scribed in my presence. The recorded information has been reviewed and considered.    Ward Givens, MD 09/25/13 [phone]

## 2013-09-25 NOTE — ED Notes (Signed)
Todd Page, [facility] for Cardizem 24hr capsule 240 mg

## 2013-09-25 NOTE — ED Notes (Signed)
Pt given urinal.

## 2013-09-25 NOTE — ED Notes (Signed)
Data at 1900 pulse rate 36 incorrectly validated should read 105.

## 2013-09-26 NOTE — ED Notes (Signed)
Pt ambulated to restroom & returned to room w/ no complications. 

## 2014-03-18 ENCOUNTER — Emergency Department (HOSPITAL_COMMUNITY): Payer: Medicare Other

## 2014-03-18 ENCOUNTER — Encounter (HOSPITAL_COMMUNITY): Payer: Self-pay | Admitting: Emergency Medicine

## 2014-03-18 ENCOUNTER — Inpatient Hospital Stay (HOSPITAL_COMMUNITY)
Admission: EM | Admit: 2014-03-18 | Discharge: 2014-03-20 | DRG: 292 | Disposition: A | Discharge status: Home / Self Care | Payer: Medicare Other | Attending: Internal Medicine | Admitting: Internal Medicine

## 2014-03-18 DIAGNOSIS — R739 Hyperglycemia, unspecified: Secondary | ICD-10-CM

## 2014-03-18 DIAGNOSIS — F259 Schizoaffective disorder, unspecified: Secondary | ICD-10-CM

## 2014-03-18 DIAGNOSIS — I059 Rheumatic mitral valve disease, unspecified: Secondary | ICD-10-CM

## 2014-03-18 DIAGNOSIS — I2510 Atherosclerotic heart disease of native coronary artery without angina pectoris: Secondary | ICD-10-CM | POA: Diagnosis present

## 2014-03-18 DIAGNOSIS — I5031 Acute diastolic (congestive) heart failure: Secondary | ICD-10-CM

## 2014-03-18 DIAGNOSIS — R7309 Other abnormal glucose: Secondary | ICD-10-CM | POA: Diagnosis present

## 2014-03-18 DIAGNOSIS — E669 Obesity, unspecified: Secondary | ICD-10-CM | POA: Diagnosis present

## 2014-03-18 DIAGNOSIS — J9600 Acute respiratory failure, unspecified whether with hypoxia or hypercapnia: Secondary | ICD-10-CM

## 2014-03-18 DIAGNOSIS — R079 Chest pain, unspecified: Secondary | ICD-10-CM | POA: Diagnosis present

## 2014-03-18 DIAGNOSIS — Z8249 Family history of ischemic heart disease and other diseases of the circulatory system: Secondary | ICD-10-CM

## 2014-03-18 DIAGNOSIS — I10 Essential (primary) hypertension: Secondary | ICD-10-CM

## 2014-03-18 DIAGNOSIS — Z91199 Patient's noncompliance with other medical treatment and regimen due to unspecified reason: Secondary | ICD-10-CM

## 2014-03-18 DIAGNOSIS — Z9119 Patient's noncompliance with other medical treatment and regimen: Secondary | ICD-10-CM

## 2014-03-18 DIAGNOSIS — T502X5A Adverse effect of carbonic-anhydrase inhibitors, benzothiadiazides and other diuretics, initial encounter: Secondary | ICD-10-CM | POA: Diagnosis present

## 2014-03-18 DIAGNOSIS — I129 Hypertensive chronic kidney disease with stage 1 through stage 4 chronic kidney disease, or unspecified chronic kidney disease: Secondary | ICD-10-CM | POA: Diagnosis present

## 2014-03-18 DIAGNOSIS — I5033 Acute on chronic diastolic (congestive) heart failure: Principal | ICD-10-CM | POA: Diagnosis present

## 2014-03-18 DIAGNOSIS — M109 Gout, unspecified: Secondary | ICD-10-CM | POA: Diagnosis present

## 2014-03-18 DIAGNOSIS — R600 Localized edema: Secondary | ICD-10-CM

## 2014-03-18 DIAGNOSIS — E876 Hypokalemia: Secondary | ICD-10-CM | POA: Diagnosis present

## 2014-03-18 DIAGNOSIS — N183 Chronic kidney disease, stage 3 unspecified: Secondary | ICD-10-CM

## 2014-03-18 DIAGNOSIS — I4891 Unspecified atrial fibrillation: Secondary | ICD-10-CM

## 2014-03-18 DIAGNOSIS — I509 Heart failure, unspecified: Secondary | ICD-10-CM

## 2014-03-18 LAB — COMPREHENSIVE METABOLIC PANEL
ALT: 13 U/L (ref 0–53)
AST: 16 U/L (ref 0–37)
Albumin: 3.4 g/dL — ABNORMAL LOW (ref 3.5–5.2)
Alkaline Phosphatase: 70 U/L (ref 39–117)
BILIRUBIN TOTAL: 0.7 mg/dL (ref 0.3–1.2)
BUN: 23 mg/dL (ref 6–23)
CALCIUM: 8.8 mg/dL (ref 8.4–10.5)
CHLORIDE: 99 meq/L (ref 96–112)
CO2: 28 meq/L (ref 19–32)
CREATININE: 1.4 mg/dL — AB (ref 0.50–1.35)
GFR, EST AFRICAN AMERICAN: 61 mL/min — AB (ref >90)
GFR, EST NON AFRICAN AMERICAN: 52 mL/min — AB (ref >90)
Glucose, Bld: 115 mg/dL — ABNORMAL HIGH (ref 70–99)
Potassium: 3.7 mEq/L (ref 3.7–5.3)
Sodium: 138 mEq/L (ref 137–147)
Total Protein: 6.5 g/dL (ref 6.0–8.3)

## 2014-03-18 LAB — CBC
HEMATOCRIT: 46 % (ref 39.0–52.0)
Hemoglobin: 15.3 g/dL (ref 13.0–17.0)
MCH: 26.8 pg (ref 26.0–34.0)
MCHC: 33.3 g/dL (ref 30.0–36.0)
MCV: 80.7 fL (ref 78.0–100.0)
PLATELETS: 225 10*3/uL (ref 150–400)
RBC: 5.7 MIL/uL (ref 4.22–5.81)
RDW: 15.6 % — ABNORMAL HIGH (ref 11.5–15.5)
WBC: 10.4 10*3/uL (ref 4.0–10.5)

## 2014-03-18 LAB — TSH: TSH: 3.88 u[IU]/mL (ref 0.350–4.500)

## 2014-03-18 LAB — PROTIME-INR
INR: 1.08 (ref 0.00–1.49)
PROTHROMBIN TIME: 13.8 s (ref 11.6–15.2)

## 2014-03-18 LAB — PRO B NATRIURETIC PEPTIDE: PRO B NATRI PEPTIDE: 5041 pg/mL — AB (ref 0–125)

## 2014-03-18 LAB — TROPONIN I: Troponin I: <0.3 ng/mL (ref <0.30)

## 2014-03-18 LAB — MAGNESIUM: MAGNESIUM: 1.9 mg/dL (ref 1.5–2.5)

## 2014-03-18 MED ORDER — ASPIRIN EC 325 MG PO TBEC
325.0000 mg | DELAYED_RELEASE_TABLET | Freq: Every day | ORAL | Status: DC
  Administered 2014-03-19 – 2014-03-20 (×2): 325 mg via ORAL
  Filled 2014-03-18 (×2): qty 1

## 2014-03-18 MED ORDER — ALBUTEROL SULFATE (2.5 MG/3ML) 0.083% IN NEBU: 2.5000 mg | INHALATION_SOLUTION | RESPIRATORY_TRACT | Status: AC | PRN

## 2014-03-18 MED ORDER — HYDROCODONE-ACETAMINOPHEN 5-325 MG PO TABS: 1.0000 | ORAL_TABLET | ORAL | Status: DC | PRN

## 2014-03-18 MED ORDER — ALUM & MAG HYDROXIDE-SIMETH 200-200-20 MG/5ML PO SUSP: 30.0000 mL | Freq: Four times a day (QID) | ORAL | Status: DC | PRN

## 2014-03-18 MED ORDER — POTASSIUM CHLORIDE CRYS ER 20 MEQ PO TBCR
40.0000 meq | EXTENDED_RELEASE_TABLET | Freq: Every day | ORAL | Status: DC
Start: 2014-03-18 — End: 2014-03-20
  Administered 2014-03-18 – 2014-03-20 (×3): 40 meq via ORAL
  Filled 2014-03-18 (×2): qty 2
  Filled 2014-03-18: qty 4

## 2014-03-18 MED ORDER — FUROSEMIDE 10 MG/ML IJ SOLN
40.0000 mg | Freq: Three times a day (TID) | INTRAMUSCULAR | Status: DC
  Administered 2014-03-18 – 2014-03-19 (×4): 40 mg via INTRAVENOUS
  Filled 2014-03-18 (×4): qty 4

## 2014-03-18 MED ORDER — ENOXAPARIN SODIUM 40 MG/0.4ML ~~LOC~~ SOLN
40.0000 mg | SUBCUTANEOUS | Status: DC
  Administered 2014-03-18 – 2014-03-20 (×3): 40 mg via SUBCUTANEOUS
  Filled 2014-03-18 (×3): qty 0.4

## 2014-03-18 MED ORDER — ONDANSETRON HCL 4 MG PO TABS: 4.0000 mg | ORAL_TABLET | Freq: Four times a day (QID) | ORAL | Status: DC | PRN

## 2014-03-18 MED ORDER — ASPIRIN 81 MG PO CHEW
324.0000 mg | CHEWABLE_TABLET | Freq: Once | ORAL | Status: AC
  Administered 2014-03-18: 324 mg via ORAL
  Filled 2014-03-18: qty 4

## 2014-03-18 MED ORDER — DILTIAZEM HCL 60 MG PO TABS
60.0000 mg | ORAL_TABLET | Freq: Two times a day (BID) | ORAL | Status: DC
  Administered 2014-03-18 (×2): 60 mg via ORAL
  Filled 2014-03-18 (×3): qty 1

## 2014-03-18 MED ORDER — ACETAMINOPHEN 325 MG PO TABS
650.0000 mg | ORAL_TABLET | Freq: Four times a day (QID) | ORAL | Status: DC | PRN
  Administered 2014-03-19: 650 mg via ORAL
  Filled 2014-03-18: qty 2

## 2014-03-18 MED ORDER — FUROSEMIDE 10 MG/ML IJ SOLN
40.0000 mg | Freq: Two times a day (BID) | INTRAMUSCULAR | Status: DC
  Administered 2014-03-18: 40 mg via INTRAVENOUS
  Filled 2014-03-18: qty 4

## 2014-03-18 MED ORDER — SODIUM CHLORIDE 0.9 % IJ SOLN
3.0000 mL | Freq: Two times a day (BID) | INTRAMUSCULAR | Status: DC
  Administered 2014-03-18 – 2014-03-19 (×2): 3 mL via INTRAVENOUS

## 2014-03-18 MED ORDER — NITROGLYCERIN 2 % TD OINT
0.5000 [in_us] | TOPICAL_OINTMENT | Freq: Once | TRANSDERMAL | Status: AC
  Administered 2014-03-18: 0.5 [in_us] via TOPICAL
  Filled 2014-03-18: qty 1

## 2014-03-18 MED ORDER — SENNA 8.6 MG PO TABS
1.0000 | ORAL_TABLET | Freq: Two times a day (BID) | ORAL | Status: DC
  Administered 2014-03-18 – 2014-03-20 (×5): 8.6 mg via ORAL
  Filled 2014-03-18 (×5): qty 1

## 2014-03-18 MED ORDER — ONDANSETRON HCL 4 MG/2ML IJ SOLN: 4.0000 mg | Freq: Four times a day (QID) | INTRAMUSCULAR | Status: DC | PRN

## 2014-03-18 MED ORDER — FUROSEMIDE 10 MG/ML IJ SOLN
40.0000 mg | Freq: Once | INTRAMUSCULAR | Status: AC
  Administered 2014-03-18: 40 mg via INTRAVENOUS
  Filled 2014-03-18: qty 4

## 2014-03-18 MED ORDER — ALPRAZOLAM 0.25 MG PO TABS
0.2500 mg | ORAL_TABLET | Freq: Two times a day (BID) | ORAL | Status: DC | PRN
  Administered 2014-03-18: 0.25 mg via ORAL
  Filled 2014-03-18: qty 1

## 2014-03-18 MED ORDER — ACETAMINOPHEN 650 MG RE SUPP: 650.0000 mg | Freq: Four times a day (QID) | RECTAL | Status: DC | PRN

## 2014-03-18 NOTE — ED Provider Notes (Signed)
CSN: [id number]     Arrival date & time 03/18/14  1054 History  This chart was scribed for Carmin Muskrat, MD by Ludger Nutting, ED Scribe. This patient was seen in room APA10/APA10 and the patient's care was started 11:46 AM.    Chief Complaint  Patient presents with  . Shortness of Breath      The history is provided by the patient. No language interpreter was used.    HPI Comments: JATAVIUS ELLENWOOD is a 63 y.o. male with past medical history of CHF, A-fib, HTN who presents to the Emergency Department complaining of constant, unchanged chest pain and SOB that began today. He states the chest pain is mild and describes it as as "twinge here and there." Patient states he becomes short of breath when the weather becomes humid. He denies fever, chills, vomiting, diarrhea.   Past Medical History  Diagnosis Date  . Hypertension     minimal coronary atherosclerosis by CT  . Schizoaffective disorder     With depression  . Atrial fibrillation 06/2012    onset 06/2012; normal TSH; EF-45%  . Overweight   . Gout   . Noncompliance 09/21/2012  . Cholelithiasis     incidental finding on CT in 2013  . Systolic dysfunction   . A-fib   . CHF (congestive heart failure)    Past Surgical History  Procedure Laterality Date  . Colonoscopy  2006    normal screening examination   Family History  Problem Relation Age of Onset  . Hypertension Mother   . Liver disease Father    History  Substance Use Topics  . Smoking status: Never Smoker   . Smokeless tobacco: Never Used  . Alcohol Use: No     Comment: former     Review of Systems  Constitutional:       Per HPI, otherwise negative  HENT:       Per HPI, otherwise negative  Respiratory:       Per HPI, otherwise negative  Cardiovascular:       Per HPI, otherwise negative  Gastrointestinal: Negative for vomiting.  Endocrine:       Negative aside from HPI  Genitourinary:       Neg aside from HPI   Musculoskeletal:       Per HPI, otherwise  negative  Skin: Negative.   Neurological: Negative for syncope.      Allergies  Review of patient's allergies indicates no known allergies.  Home Medications   Prior to Admission medications   Medication Sig Start Date End Date Taking? Authorizing Provider  diltiazem (CARDIZEM CD) 360 MG 24 hr capsule Take 1 capsule (360 mg total) by mouth daily. 09/25/13   Janice Norrie, MD  furosemide (LASIX) 40 MG tablet Take 0.5 tablets (20 mg total) by mouth 2 (two) times daily. 09/25/13   Janice Norrie, MD  lisinopril (ZESTRIL) 10 MG tablet Take 1 tablet (10 mg total) by mouth daily. 09/25/13   Janice Norrie, MD  naproxen sodium (ALEVE) 220 MG tablet Take 440 mg by mouth daily as needed (for pain).    Historical Provider, MD  potassium chloride (K-DUR) 10 MEQ tablet Take 1 tablet (10 mEq total) by mouth 2 (two) times daily. 09/25/13   Janice Norrie, MD   BP 155/99  Pulse 79  Temp(Src) 98.5 F (36.9 C) (Oral)  Resp 22  SpO2 97% Physical Exam  Nursing note and vitals reviewed. Constitutional: He is oriented to person, place,  and time. He appears well-developed. No distress.  HENT:  Head: Normocephalic and atraumatic.  Eyes: Conjunctivae and EOM are normal.  Cardiovascular: Normal rate.  An irregular rhythm present.  Pulmonary/Chest: Effort normal. No stridor. No respiratory distress. He has no wheezes.  Diminished breath sounds without wheezing.   Abdominal: He exhibits no distension.  Musculoskeletal: He exhibits no edema.  Neurological: He is alert and oriented to person, place, and time.  Skin: Skin is warm and dry.  Psychiatric: He has a normal mood and affect.    ED Course  Procedures (including critical care time)  DIAGNOSTIC STUDIES: Oxygen Saturation is 94% on RA, adequate by my interpretation.    COORDINATION OF CARE: 11:48 AM Discussed treatment plan with pt at bedside and pt agreed to plan.   Labs Review Labs Reviewed - No data to display  Imaging Review Dg Chest 2  View  03/18/2014   CLINICAL DATA:  Shortness of breath, generalized chest pain, history hypertension, CHF, atrial fibrillation  EXAM: CHEST  2 VIEW  COMPARISON:  09/25/2013  FINDINGS: Enlargement of cardiac silhouette.  Mediastinal contours and pulmonary vascularity normal.  Atherosclerotic calcification aorta.  Persistent RIGHT basilar atelectasis.  Lungs otherwise clear.  No pleural effusion or pneumothorax.  Scattered endplate spur formation thoracic spine.  IMPRESSION: Enlargement of cardiac silhouette with persistent RIGHT basilar atelectasis.   Electronically Signed   By: Lavonia Dana M.D.   On: 03/18/2014 11:17     EKG Interpretation   Date/Time:  Thursday Mar 18 2014 11:05:48 EDT Ventricular Rate:  98 PR Interval:    QRS Duration: 89 QT Interval:  376 QTC Calculation: 480 R Axis:   75 Text Interpretation:  Atrial fibrillation Borderline T abnormalities,  lateral leads Borderline prolonged QT interval Baseline wander in lead(s)  II V2 Atrial fibrillation T wave abnormality Abnormal ekg Confirmed by  Carmin Muskrat  MD ([id number]) on 03/18/2014 12:40:57 PM     12:41 PM Patient appears calm. labs notable for elevated BNP.   Patient states that he previously was on Lasix, but has no access to healthcare to obtain new prescriptions.  MDM    I personally performed the services described in this documentation, which was scribed in my presence. The recorded information has been reviewed and is accurate.   Patient presents with dyspnea.  Patient also had chest pain, though this resolved prior to my evaluation.  Patient's evaluation demonstrates elevated BNP, persistent A. fib.  Patient required admission for further evaluation and management after receiving nitroglycerin, Lasix in the emergency department.  Carmin Muskrat, MD 03/18/14 1242

## 2014-03-18 NOTE — ED Notes (Signed)
Pt currently denies chest pain

## 2014-03-18 NOTE — Progress Notes (Signed)
  Echocardiogram 2D Echocardiogram has been performed.  Todd Page 03/18/2014, 4:03 PM

## 2014-03-18 NOTE — H&P (Signed)
[name] History and Physical  Todd Page RSW:[id number] DOB: 12/03/1950 DOA: 03/18/2014  Referring physician:  PCP: No PCP Per Patient   Chief Complaint: shortness of breath  HPI: Todd Page is a 63 y.o. male with a past medical history that includes hypertension, atrial fibrillation, schizoaffective disorder, noncompliance, chronic systolic dysfunction presents to the emergency department with the chief complaint of shortness of breath. Information is obtained from the patient but may be somewhat unreliable. He states he was in his usual state of health until today when he developed shortness of breath and slight worsening of lower extremity edema. He indicates there was some dispute about "whether oblong there whether a double long there" and he developed chest pain and shortness of breath. He reports he has not had any medications for several months. He denies headache visual disturbances fever chills nausea vomiting diarrhea constipation melena. He reports he's been drinking his normal amount.  Initial evaluation in the emergency department reveals proBNP of 5041, creatinine of 1.40 and a chest x-ray with enlarged cardiac silhouette and persistent right basilar atelectasis. He is given Lasix intravenously 40mg , 325 mg of aspirin and 1 inch of nitroglycerin ointment.   He is hemodynamically stable afebrile and not hypoxic   Review of Systems:  10 point review of systems complete and all systems are negative except as indicated in the history of present illness   Past Medical History  Diagnosis Date  . Hypertension     minimal coronary atherosclerosis by CT  . Schizoaffective disorder     With depression  . Atrial fibrillation 06/2012    onset 06/2012; normal TSH; EF-45%  . Overweight   . Gout   . Noncompliance 09/21/2012  . Cholelithiasis     incidental finding on CT in 2013  . Systolic dysfunction   . A-fib   . CHF (congestive heart failure)    Past Surgical History    Procedure Laterality Date  . Colonoscopy  2006    normal screening examination   Social History:  reports that he has never smoked. He has never used smokeless tobacco. He reports that he does not drink alcohol or use illicit drugs. Patient reports that he lives in a hotel and that currently there is some dispute about "whether or not I belong there".  No Known Allergies  Family History  Problem Relation Age of Onset  . Hypertension Mother   . Liver disease Father      Prior to Admission medications   Medication Sig Start Date End Date Taking? Authorizing Provider  acetaminophen (TYLENOL) 500 MG tablet Take 1,000 mg by mouth every 6 (six) hours as needed for headache.   Yes Historical Provider, MD   Physical Exam: Filed Vitals:   03/18/14 1238  BP: 136/87  Pulse: 97  Temp:   Resp: 22    BP 136/87  Pulse 97  Temp(Src) 98.5 F (36.9 C) (Oral)  Resp 22  SpO2 97%  General:  Appears calm and comfortable Eyes: PERRL, normal lids, irises & conjunctiva ENT: grossly normal hearing, lips & tongue Neck: no LAD, masses or thyromegaly Cardiovascular: Irregularly irregular no murmur no gallop no rub bilateral lower extremities with venous stasis changes. Trace LE edema Respiratory: Normal effort. Breath sounds are diminished but clear. I hear no crackles no wheeze no rub Abdomen: soft, ntnd positive bowel sounds throughout Skin: no rash or induration seen on limited exam bilateral lower extremity with chronic venous changes. Open blister just under the left knee.  No drainage Musculoskeletal: No clubbing or cyanosis Psychiatric: Cooperative, replies with very short answers, Neurologic: grossly non-focal.          Labs on Admission:  Basic Metabolic Panel:  Recent Labs Lab 03/18/14 1151  NA 138  K 3.7  CL 99  CO2 28  GLUCOSE 115*  BUN 23  CREATININE 1.40*  CALCIUM 8.8  MG 1.9   Liver Function Tests:  Recent Labs Lab 03/18/14 1151  AST 16  ALT 13  ALKPHOS 70   BILITOT 0.7  PROT 6.5  ALBUMIN 3.4*   No results found for this basename: LIPASE, AMYLASE,  in the last 168 hours No results found for this basename: AMMONIA,  in the last 168 hours CBC:  Recent Labs Lab 03/18/14 1151  WBC 10.4  HGB 15.3  HCT 46.0  MCV 80.7  PLT 225   Cardiac Enzymes:  Recent Labs Lab 03/18/14 1151  TROPONINI <0.30    BNP (last 3 results)  Recent Labs  06/08/13 1640 09/25/13 1751 03/18/14 1151  PROBNP 1162.0* 1783.0* 5041.0*   CBG: No results found for this basename: GLUCAP,  in the last 168 hours  Radiological Exams on Admission: Dg Chest 2 View  03/18/2014   CLINICAL DATA:  Shortness of breath, generalized chest pain, history hypertension, CHF, atrial fibrillation  EXAM: CHEST  2 VIEW  COMPARISON:  09/25/2013  FINDINGS: Enlargement of cardiac silhouette.  Mediastinal contours and pulmonary vascularity normal.  Atherosclerotic calcification aorta.  Persistent RIGHT basilar atelectasis.  Lungs otherwise clear.  No pleural effusion or pneumothorax.  Scattered endplate spur formation thoracic spine.  IMPRESSION: Enlargement of cardiac silhouette with persistent RIGHT basilar atelectasis.   Electronically Signed   By: Lavonia Dana M.D.   On: 03/18/2014 11:17    EKG: Independently reviewed atrial fibrillation  Assessment/Plan Principal Problem:   Acute exacerbation of congestive heart failure: History of diastolic heart failure. Chart review indicates patient should be on 40 mg of Lasix daily. He has a long history of noncompliance do to schizoaffective disorder and finances. We will continue the IV Lasix that was started in the emergency department. We will monitor his intake and output we will obtain daily weights. Last echo in July 2014 with an EF of 55% and severe LVH. No need to repeat at this time. Active Problems: Chest pain: Resolved at the time of my exam. Patient reports it was very brief. His troponin is negative. We'll cycle his troponins.  Will obtain an EKG should he have a recurrence.    Atrial fibrillation: Has been noncompliant with rate control medications. His rate is controlled while in the emergency department. Chart review indicates he was on Cardizem 360 mg in the past. Currently his heart rate is 78 beats per minute and his blood pressure is 136/91. I will resume his Cardizem at 60 mg twice a day with parameters. He is not a candidate for anticoagulation do to his long history of noncompliance.  Chronic kidney failure stage III- chart review indicates current creatinine of 1.4 is slightly above baseline. Patient reports not having any medications for the last several months. Will hold his lisinopril for now. Will recheck in the morning. Monitor his urine output  Chronic LE edema: appears stable at baseline. Chronic venous changes. Lasix as above. Wound care to venous ulcers.   Malignant hypertension: History of same. Blood pressure in the emergency department ranges from 155/99-136/91. Chart review indicates her medications in the past include lisinopril 10 mg daily, Cardizem 360 mg  daily, Lasix 40 mg daily. Will hold lisinopril for now do to slightly worsening creatinine. Will resume Cardizem and IV Lasix as indicated above. Will provide hydralazine when necessary as indicated. Will plan to resume lisinopril once renal function improved    Obesity: Stable    Schizoaffective disorder: Appears stable at baseline    Noncompliance:stable      Code Status: full Family Communication: none present Disposition Plan: home when ready  Time spent: 55 minutes  [facility] Pager [phone]  **Disclaimer: This note may have been dictated with voice recognition software. Similar sounding words can inadvertently be transcribed and this note may contain transcription errors which may not have been corrected upon publication of note.**

## 2014-03-18 NOTE — ED Notes (Signed)
Pt comes from home via EMS with c/o SOB and generalized chest pain. Pt states symptoms began this morning. Pt denies cough, fever. NAD on arrival to ED.

## 2014-03-18 NOTE — H&P (Signed)
Patient seen and examined.  Above note reviewed.  Patient has been admitted with acute on chronic congestive heart failure. He will be diuresed with IV lasix.  Repeat echo.  Todd Page

## 2014-03-19 DIAGNOSIS — R739 Hyperglycemia, unspecified: Secondary | ICD-10-CM

## 2014-03-19 DIAGNOSIS — R609 Edema, unspecified: Secondary | ICD-10-CM

## 2014-03-19 DIAGNOSIS — I5031 Acute diastolic (congestive) heart failure: Secondary | ICD-10-CM

## 2014-03-19 DIAGNOSIS — I509 Heart failure, unspecified: Secondary | ICD-10-CM | POA: Diagnosis present

## 2014-03-19 LAB — BASIC METABOLIC PANEL
BUN: 29 mg/dL — AB (ref 6–23)
CO2: 29 mEq/L (ref 19–32)
Calcium: 9.3 mg/dL (ref 8.4–10.5)
Chloride: 99 mEq/L (ref 96–112)
Creatinine, Ser: 1.48 mg/dL — ABNORMAL HIGH (ref 0.50–1.35)
GFR, EST AFRICAN AMERICAN: 57 mL/min — AB (ref >90)
GFR, EST NON AFRICAN AMERICAN: 49 mL/min — AB (ref >90)
Glucose, Bld: 130 mg/dL — ABNORMAL HIGH (ref 70–99)
Potassium: 3.5 mEq/L — ABNORMAL LOW (ref 3.7–5.3)
Sodium: 142 mEq/L (ref 137–147)

## 2014-03-19 LAB — TROPONIN I

## 2014-03-19 LAB — MRSA PCR SCREENING: MRSA by PCR: NEGATIVE

## 2014-03-19 MED ORDER — METOPROLOL SUCCINATE ER 50 MG PO TB24
50.0000 mg | ORAL_TABLET | Freq: Every day | ORAL | Status: DC
  Administered 2014-03-19 – 2014-03-20 (×2): 50 mg via ORAL
  Filled 2014-03-19 (×2): qty 1

## 2014-03-19 MED ORDER — HYDRALAZINE HCL 20 MG/ML IJ SOLN: 5.0000 mg | Freq: Three times a day (TID) | INTRAMUSCULAR | Status: DC | PRN
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
## 2014-03-19 NOTE — Progress Notes (Signed)
[name] PROGRESS NOTE  Todd Page:[id number] DOB: 1951-04-07 DOA: 03/18/2014 PCP: No PCP Per Patient    Summary: 63 year old man with hx of HTN, afib, chf, schizoaffective disorder and non-compliance admitted with chf exacerbation. Hx frequent hospitalizations as well.  Assessment/Plan:   Principal Problem:  Acute exacerbation of congestive heart failure: History of diastolic heart failure. Chart review indicates patient should be on 40 mg of Lasix daily. He has a long history of noncompliance due to schizoaffective disorder and finances. Volume status -2.5L and weight 115.9kg down from 117.3kg on admission. Will continue the IV Lasix TID.  Echo yields possible distal septal hypokinesis The cavity size was mildly dilated. Wall thickness was increased in a pattern of severe LVH. The estimated ejection fraction was 55%. Mitral valve: Mild regurgitation. Left atrium: The atrium was mildly dilated. Pericardium, extracardiac: A trivial pericardial effusion was identified posterior to the heart.    Active Problems:  Chest pain: No further episodes. Troponin negative x3. Will obtain an EKG should he have a recurrence.   Atrial fibrillation: Has been noncompliant with rate control medications. Cardizem started on admission. HR range 70-92. Will discontinue cardizem and start toprol.  Chart review indicates he was on Cardizem 360 mg in the past. Unclear why he was not on BB.  He is not a candidate for anticoagulation do to his long history of noncompliance.   Hypokalemia: related to lasix. Will replete and recheck.   Chronic kidney failure stage III- chart review indicates current creatinine of 1.48. Up only slightly from admission. Patient reports not having any medications for the last several months. Continue to hold his lisinopril for now. Will recheck in the morning. Monitor his urine output   Chronic LE edema: patient reports edema above baseline.  Chronic venous changes. Lasix as  above. Wound care to venous ulcers.   Malignant hypertension: History of same. Poor control.  Blood pressure range 116-172. Chart review indicates her medications in the past include lisinopril 10 mg daily, Cardizem 360 mg daily, Lasix 40 mg daily. Will hold lisinopril for now do to slightly worsening creatinine. Will change Cardizem to toprol and IV Lasix as indicated above. Will provide hydralazine when necessary as indicated. Will plan to resume lisinopril once renal function improved   Obesity: Stable   Hyperglycemia: mild. Will obtain HgA1c. No hx diabetes.  Monitor   Schizoaffective disorder: Appears stable at baseline   Noncompliance:remains irritable with SW as discharge plan discussed.   Code Status: full Family Communication: none present Disposition Plan: to be determined. Likely tomorrow.    Consultants:  none  Procedures: Echo Left ventricle: Possible distal septal hypokinesis The cavity size was mildly dilated. Wall thickness was increased in a pattern of severe LVH. The estimated ejection fraction was 55%. - Mitral valve: Mild regurgitation. - Left atrium: The atrium was mildly dilated. - Pericardium, extracardiac: A trivial pericardial effusion was identified posterior to the heart  Antibiotics:  none  HPI/Subjective: Sitting on side of bed. Reports LE edema "bigger than usual".   Objective: Filed Vitals:   03/19/14 0541  BP: 172/97  Pulse: 82  Temp: 97.9 F (36.6 C)  Resp: 20    Intake/Output Summary (Last 24 hours) at 03/19/14 1017 Last data filed at 03/19/14 0500  Gross per 24 hour  Intake    400 ml  Output   2900 ml  Net  -2500 ml   Filed Weights   03/18/14 1411 03/19/14 0500  Weight: 117.346 kg (258 lb  11.2 oz) 115.9 kg (255 lb 8.2 oz)    Exam:   General:  Obese appears comfortable  Cardiovascular: irregularly irregular No MGR trace LE edema  Respiratory: normal effort BS distant throughout but clear. No crackles no  wheeze  Abdomen: obese soft +BS non-tender to palpation  Musculoskeletal: LE with chronic venous changes. Open blister on left knee. Dressing dry and intact.    Data Reviewed: Basic Metabolic Panel:  Recent Labs Lab 03/18/14 1151 03/19/14 0611  NA 138 142  K 3.7 3.5*  CL 99 99  CO2 28 29  GLUCOSE 115* 130*  BUN 23 29*  CREATININE 1.40* 1.48*  CALCIUM 8.8 9.3  MG 1.9  --    Liver Function Tests:  Recent Labs Lab 03/18/14 1151  AST 16  ALT 13  ALKPHOS 70  BILITOT 0.7  PROT 6.5  ALBUMIN 3.4*   No results found for this basename: LIPASE, AMYLASE,  in the last 168 hours No results found for this basename: AMMONIA,  in the last 168 hours CBC:  Recent Labs Lab 03/18/14 1151  WBC 10.4  HGB 15.3  HCT 46.0  MCV 80.7  PLT 225   Cardiac Enzymes:  Recent Labs Lab 03/18/14 1151 03/18/14 1736 03/18/14 2342  TROPONINI <0.30 <0.30 <0.30   BNP (last 3 results)  Recent Labs  06/08/13 1640 09/25/13 1751 03/18/14 1151  PROBNP 1162.0* 1783.0* 5041.0*   CBG: No results found for this basename: GLUCAP,  in the last 168 hours  Recent Results (from the past 240 hour(s))  MRSA PCR SCREENING     Status: None   Collection Time    03/19/14  4:54 AM      Result Value Ref Range Status   MRSA by PCR NEGATIVE  NEGATIVE Final   Comment:            The GeneXpert MRSA Assay (FDA     approved for NASAL specimens     only), is one component of a     comprehensive MRSA colonization     surveillance program. It is not     intended to diagnose MRSA     infection nor to guide or     monitor treatment for     MRSA infections.     Studies: Dg Chest 2 View  03/18/2014   CLINICAL DATA:  Shortness of breath, generalized chest pain, history hypertension, CHF, atrial fibrillation  EXAM: CHEST  2 VIEW  COMPARISON:  09/25/2013  FINDINGS: Enlargement of cardiac silhouette.  Mediastinal contours and pulmonary vascularity normal.  Atherosclerotic calcification aorta.  Persistent  RIGHT basilar atelectasis.  Lungs otherwise clear.  No pleural effusion or pneumothorax.  Scattered endplate spur formation thoracic spine.  IMPRESSION: Enlargement of cardiac silhouette with persistent RIGHT basilar atelectasis.   Electronically Signed   By: Lavonia Dana M.D.   On: 03/18/2014 11:17    Scheduled Meds: . aspirin EC  325 mg Oral Daily  . enoxaparin (LOVENOX) injection  40 mg Subcutaneous Q24H  . furosemide  40 mg Intravenous TID  . metoprolol succinate  50 mg Oral Daily  . potassium chloride  40 mEq Oral Daily  . senna  1 tablet Oral BID  . sodium chloride  3 mL Intravenous Q12H   Continuous Infusions:   Principal Problem:   Acute exacerbation of congestive heart failure Active Problems:   Atrial fibrillation   Obesity   Schizoaffective disorder   Noncompliance   Malignant hypertension   Hypokalemia   Chronic kidney  disease, stage III (moderate)   Chest pain   Acute on chronic heart failure   Hyperglycemia    Time spent: [facility] Pager [phone]. If 7PM-7AM, please contact night-coverage at [email or web address], password [facility] 03/19/2014, 10:17 AM  LOS: 1 day

## 2014-03-19 NOTE — Progress Notes (Signed)
Patient seen and examined. Above note reviewed.  Patient appears to be improving from a volume overload standpoint. He is diuresed with IV Lasix. His shortness of breath is improving. I suspect he'll need one more day of intravenous Lasix and may transition to by mouth tomorrow. Anticipate discharge home in the next 24 hours.  [employer]

## 2014-03-19 NOTE — Progress Notes (Signed)
UR completed 

## 2014-03-20 DIAGNOSIS — F259 Schizoaffective disorder, unspecified: Secondary | ICD-10-CM

## 2014-03-20 DIAGNOSIS — J9600 Acute respiratory failure, unspecified whether with hypoxia or hypercapnia: Secondary | ICD-10-CM

## 2014-03-20 LAB — BASIC METABOLIC PANEL
BUN: 28 mg/dL — ABNORMAL HIGH (ref 6–23)
CHLORIDE: 97 meq/L (ref 96–112)
CO2: 33 meq/L — AB (ref 19–32)
Calcium: 9.2 mg/dL (ref 8.4–10.5)
Creatinine, Ser: 1.6 mg/dL — ABNORMAL HIGH (ref 0.50–1.35)
GFR, EST AFRICAN AMERICAN: 52 mL/min — AB (ref >90)
GFR, EST NON AFRICAN AMERICAN: 45 mL/min — AB (ref >90)
GLUCOSE: 87 mg/dL (ref 70–99)
Potassium: 3.5 mEq/L — ABNORMAL LOW (ref 3.7–5.3)
SODIUM: 141 meq/L (ref 137–147)

## 2014-03-20 NOTE — Progress Notes (Signed)
Discussed with patient about discharge plan.  Patient requesting to go to SNF.  However, does not meet criteria.  When I discussed the fact that the patient could not be discharge to a SNF, he became very upset and angry and stated we were just throwing him out on the street to die.  Spoke with Dr Roderic Palau about d/c planning.  Offered patient information about shelters in the area and offered to get assistance for medications.  Asked patient if he could return to the hotel he was staying at prior to admission.  Patient stated he could not and continued to get increasingly upset.  Several minutes later patient found getting on the elevator, when nursing staff attempted to get him to come back to his room he cursed at them and told us to leave him alone, that he was leaving and would not sign any papers from this place.  MD notified of patient leaving AMA.

## 2014-03-20 NOTE — Discharge Summary (Signed)
Physician Discharge Summary  Todd Page NLZ:[id number] DOB: [birth year] DOA: 03/18/2014  PCP: No PCP Per Patient  Admit date: 03/18/2014 Discharge date: 03/20/2014  Time spent: 40 minutes  Recommendations for Outpatient Follow-up:  1. PATIENT LEFT HOSPITAL AGAINST MEDICAL ADVICE. DISCHARGE MEDICATIONS COULD NOT BE ARRANGED.  FOLLOW UP COULD NOT BE ARRANGED.  Discharge Diagnoses:  Principal Problem:   Acute on chronic diastolic congestive heart failure Active Problems:   Atrial fibrillation   Obesity   Schizoaffective disorder   Noncompliance   Malignant hypertension   Hypokalemia   Chronic kidney disease, stage III (moderate)   Chest pain   Hyperglycemia   NONCOMPLIANCE  Discharge Condition: [place] Weights   03/18/14 1411 03/19/14 0500 03/20/14 0431  Weight: 117.346 kg (258 lb 11.2 oz) 115.9 kg (255 lb 8.2 oz) 116.9 kg (257 lb 11.5 oz)    History of present illness:  Todd Page is a 63 y.o. male with a past medical history that includes hypertension, atrial fibrillation, schizoaffective disorder, noncompliance, chronic systolic dysfunction presents to the emergency department with the chief complaint of shortness of breath. Information is obtained from the patient but may be somewhat unreliable. He states he was in his usual state of health until today when he developed shortness of breath and slight worsening of lower extremity edema. He indicates there was some dispute about "whether oblong there whether a double long there" and he developed chest pain and shortness of breath. He reports he has not had any medications for several months. He denies headache visual disturbances fever chills nausea vomiting diarrhea constipation melena. He reports he's been drinking his normal amount.  Initial evaluation in the emergency department reveals proBNP of 5041, creatinine of 1.40 and a chest x-ray with enlarged cardiac silhouette and persistent  right basilar atelectasis. He is given Lasix intravenously 40mg , 325 mg of aspirin and 1 inch of nitroglycerin ointment.  He is hemodynamically stable afebrile and not hypoxic   Hospital Course:  Patient was admitted to the hospital with acute on chronic diastolic congestive heart failure. He was started on intravenous Lasix and had good urine output. His volume status quickly improved and he was approaching euvolemia. The patient was ambulating on the nurse's unit without any difficulty. His lungs sounded clear to auscultation. He only had trace edema with chronic stasis changes in his lower extremities. It was felt that he could likely transition to oral Lasix and discharged home. Arrangements were being made for the patient's discharge when he requested transfer to skilled nursing facility. He was informed that he did not meet criteria for placement in a skilled nursing facility. Patient has had various social issues as an outpatient. He was reportedly staying in a hotel prior to admission and has had difficulty maintaining a long-term residence. He is well known to our service from past admissions and has a long history of noncompliance. Despite extensive efforts to help patient obtain his medications/close outpatient followup, he frequently requires readmission for exacerbation of CHF, and claims that he did not receive any medications on discharge. On the day of discharge, the patient became increasingly upset that he would not be placed in a skilled nursing facility and left the hospital without obtaining any discharge instructions or discharge prescriptions.  Procedures: Echo: - Left ventricle: The cavity size was normal. Wall thickness was increased in a pattern of severe LVH. Systolic function was normal. The estimated ejection fraction was in the range of 50%  to 55%. The study was not technically sufficient to allow evaluation of LV diastolic dysfunction due to atrial fibrillation. Doppler  parameters are consistent with high ventricular filling pressure. - Aortic valve: Mildly calcified annulus. Trileaflet; mildly thickened leaflets. There was no stenosis. There was mild regurgitation. - Mitral valve: There was mild to moderate regurgitation. - Left atrium: The atrium was severely dilated. - Tricuspid valve: There was mild regurgitation. - Pulmonary arteries: PA peak pressure: 38 mm Hg (S). Mildly elevated pulmonary pressures. - Pericardium, extracardiac: A trivial pericardial effusion was identified.    Consultations:    Discharge Exam: Filed Vitals:   03/20/14 0431  BP: 129/82  Pulse: 86  Temp: 97.7 F (36.5 C)  Resp: 20    General: No acute distress Cardiovascular: Clear to auscultation bilaterally Respiratory: Trace edema with chronic venous stasis changes  Discharge Instructions You were cared for by a hospitalist during your hospital stay. If you have any questions about your discharge medications or the care you received while you were in the hospital after you are discharged, you can call the unit and asked to speak with the hospitalist on call if the hospitalist that took care of you is not available. Once you are discharged, your primary care physician will handle any further medical issues. Please note that NO REFILLS for any discharge medications will be authorized once you are discharged, as it is imperative that you return to your primary care physician (or establish a relationship with a primary care physician if you do not have one) for your aftercare needs so that they can reassess your need for medications and monitor your lab values.     Medication List    ASK your doctor about these medications       acetaminophen 500 MG tablet  Commonly known as:  TYLENOL  Take 1,000 mg by mouth every 6 (six) hours as needed for headache.       No Known Allergies    The results of significant diagnostics from this hospitalization (including  imaging, microbiology, ancillary and laboratory) are listed below for reference.    Significant Diagnostic Studies: Dg Chest 2 View  03/18/2014   CLINICAL DATA:  Shortness of breath, generalized chest pain, history hypertension, CHF, atrial fibrillation  EXAM: CHEST  2 VIEW  COMPARISON:  09/25/2013  FINDINGS: Enlargement of cardiac silhouette.  Mediastinal contours and pulmonary vascularity normal.  Atherosclerotic calcification aorta.  Persistent RIGHT basilar atelectasis.  Lungs otherwise clear.  No pleural effusion or pneumothorax.  Scattered endplate spur formation thoracic spine.  IMPRESSION: Enlargement of cardiac silhouette with persistent RIGHT basilar atelectasis.   Electronically Signed   By: Lavonia Dana M.D.   On: 03/18/2014 11:17    Microbiology: Recent Results (from the past 240 hour(s))  MRSA PCR SCREENING     Status: None   Collection Time    03/19/14  4:54 AM      Result Value Ref Range Status   MRSA by PCR NEGATIVE  NEGATIVE Final   Comment:            The GeneXpert MRSA Assay (FDA     approved for NASAL specimens     only), is one component of a     comprehensive MRSA colonization     surveillance program. It is not     intended to diagnose MRSA     infection nor to guide or     monitor treatment for     MRSA infections.  Labs: Basic Metabolic Panel:  Recent Labs Lab 03/18/14 1151 03/19/14 0611 03/20/14 0543  NA 138 142 141  K 3.7 3.5* 3.5*  CL 99 99 97  CO2 28 29 33*  GLUCOSE 115* 130* 87  BUN 23 29* 28*  CREATININE 1.40* 1.48* 1.60*  CALCIUM 8.8 9.3 9.2  MG 1.9  --   --    Liver Function Tests:  Recent Labs Lab 03/18/14 1151  AST 16  ALT 13  ALKPHOS 70  BILITOT 0.7  PROT 6.5  ALBUMIN 3.4*   No results found for this basename: LIPASE, AMYLASE,  in the last 168 hours No results found for this basename: AMMONIA,  in the last 168 hours CBC:  Recent Labs Lab 03/18/14 1151  WBC 10.4  HGB 15.3  HCT 46.0  MCV 80.7  PLT 225   Cardiac  Enzymes:  Recent Labs Lab 03/18/14 1151 03/18/14 1736 03/18/14 2342  TROPONINI <0.30 <0.30 <0.30   BNP: BNP (last 3 results)  Recent Labs  06/08/13 1640 09/25/13 1751 03/18/14 1151  PROBNP 1162.0* 1783.0* 5041.0*   CBG: No results found for this basename: GLUCAP,  in the last 168 hours     Signed:  Kathie Dike  [name] 03/20/2014, 1:57 PM

## 2014-03-22 NOTE — Care Management Note (Signed)
    Page 1 of 1   03/22/2014     4:34:26 PM CARE MANAGEMENT NOTE 03/22/2014  Patient:  Todd Page, Todd Page   Account Number:  [id number]  Date Initiated:  03/22/2014  Documentation initiated by:  Vladimir Creeks  Subjective/Objective Assessment:   Pt went [facility] 03/20/14     Action/Plan:   Anticipated DC Date:  03/22/2014   Anticipated DC Plan:  AGAINST MEDICAL ADVICE         Choice offered to / List presented to:             Status of service:  Completed, signed off Medicare Important Message given?   (If response is "NO", the following Medicare IM given date fields will be blank) Date Medicare IM given:   Date Additional Medicare IM given:    Discharge Disposition:  [place]  Per UR Regulation:    If discussed at Long Length of Stay Meetings, dates discussed:    Comments:  03/22/14 [place] RN/CM

## 2014-04-14 ENCOUNTER — Emergency Department (HOSPITAL_COMMUNITY)
Admission: EM | Admit: 2014-04-14 | Discharge: 2014-04-14 | Disposition: A | Discharge status: Other Acute Inpatient Hospital | Payer: Medicare Other | Attending: Emergency Medicine | Admitting: Emergency Medicine

## 2014-04-14 ENCOUNTER — Emergency Department (HOSPITAL_COMMUNITY): Payer: Medicare Other

## 2014-04-14 ENCOUNTER — Encounter (HOSPITAL_COMMUNITY): Payer: Self-pay | Admitting: Emergency Medicine

## 2014-04-14 DIAGNOSIS — Z91199 Patient's noncompliance with other medical treatment and regimen due to unspecified reason: Secondary | ICD-10-CM | POA: Insufficient documentation

## 2014-04-14 DIAGNOSIS — I5022 Chronic systolic (congestive) heart failure: Secondary | ICD-10-CM | POA: Insufficient documentation

## 2014-04-14 DIAGNOSIS — Z9119 Patient's noncompliance with other medical treatment and regimen: Secondary | ICD-10-CM | POA: Insufficient documentation

## 2014-04-14 DIAGNOSIS — F209 Schizophrenia, unspecified: Secondary | ICD-10-CM | POA: Insufficient documentation

## 2014-04-14 DIAGNOSIS — Z8719 Personal history of other diseases of the digestive system: Secondary | ICD-10-CM | POA: Insufficient documentation

## 2014-04-14 DIAGNOSIS — I10 Essential (primary) hypertension: Secondary | ICD-10-CM | POA: Insufficient documentation

## 2014-04-14 DIAGNOSIS — E663 Overweight: Secondary | ICD-10-CM | POA: Insufficient documentation

## 2014-04-14 DIAGNOSIS — Z8739 Personal history of other diseases of the musculoskeletal system and connective tissue: Secondary | ICD-10-CM | POA: Insufficient documentation

## 2014-04-14 DIAGNOSIS — Z79899 Other long term (current) drug therapy: Secondary | ICD-10-CM | POA: Insufficient documentation

## 2014-04-14 DIAGNOSIS — I4891 Unspecified atrial fibrillation: Secondary | ICD-10-CM | POA: Insufficient documentation

## 2014-04-14 HISTORY — DX: Liver disease, unspecified: K76.9

## 2014-04-14 LAB — RAPID URINE DRUG SCREEN, HOSP PERFORMED
Amphetamines: NOT DETECTED
Barbiturates: NOT DETECTED
Benzodiazepines: NOT DETECTED
COCAINE: NOT DETECTED
OPIATES: NOT DETECTED
Tetrahydrocannabinol: NOT DETECTED

## 2014-04-14 LAB — COMPREHENSIVE METABOLIC PANEL
ALT: 17 U/L (ref 0–53)
AST: 20 U/L (ref 0–37)
Albumin: 4.4 g/dL (ref 3.5–5.2)
Alkaline Phosphatase: 72 U/L (ref 39–117)
BILIRUBIN TOTAL: 1 mg/dL (ref 0.3–1.2)
BUN: 20 mg/dL (ref 6–23)
CHLORIDE: 98 meq/L (ref 96–112)
CO2: 28 meq/L (ref 19–32)
CREATININE: 1.28 mg/dL (ref 0.50–1.35)
Calcium: 9.7 mg/dL (ref 8.4–10.5)
GFR calc Af Amer: 68 mL/min — ABNORMAL LOW (ref >90)
GFR, EST NON AFRICAN AMERICAN: 58 mL/min — AB (ref >90)
GLUCOSE: 103 mg/dL — AB (ref 70–99)
Potassium: 2.9 mEq/L — CL (ref 3.7–5.3)
Sodium: 142 mEq/L (ref 137–147)
Total Protein: 7.6 g/dL (ref 6.0–8.3)

## 2014-04-14 LAB — ETHANOL: Alcohol, Ethyl (B): <11 mg/dL (ref 0–11)

## 2014-04-14 LAB — CBC WITH DIFFERENTIAL/PLATELET
BASOS ABS: 0 10*3/uL (ref 0.0–0.1)
Basophils Relative: 0 % (ref 0–1)
Eosinophils Absolute: 0.1 10*3/uL (ref 0.0–0.7)
Eosinophils Relative: 1 % (ref 0–5)
HCT: 50 % (ref 39.0–52.0)
HEMOGLOBIN: 18 g/dL — AB (ref 13.0–17.0)
Lymphocytes Relative: 16 % (ref 12–46)
Lymphs Abs: 1.3 10*3/uL (ref 0.7–4.0)
MCH: 28 pg (ref 26.0–34.0)
MCHC: 36 g/dL (ref 30.0–36.0)
MCV: 77.6 fL — ABNORMAL LOW (ref 78.0–100.0)
Monocytes Absolute: 0.6 10*3/uL (ref 0.1–1.0)
Monocytes Relative: 7 % (ref 3–12)
NEUTROS ABS: 6.3 10*3/uL (ref 1.7–7.7)
NEUTROS PCT: 76 % (ref 43–77)
Platelets: 216 10*3/uL (ref 150–400)
RBC: 6.44 MIL/uL — ABNORMAL HIGH (ref 4.22–5.81)
RDW: 14.2 % (ref 11.5–15.5)
WBC: 8.4 10*3/uL (ref 4.0–10.5)

## 2014-04-14 LAB — TROPONIN I

## 2014-04-14 LAB — PRO B NATRIURETIC PEPTIDE: Pro B Natriuretic peptide (BNP): 517.6 pg/mL — ABNORMAL HIGH (ref 0–125)

## 2014-04-14 MED ORDER — SODIUM CHLORIDE 0.9 % IV SOLN
INTRAVENOUS | Status: DC
  Administered 2014-04-14: 14:00:00 via INTRAVENOUS

## 2014-04-14 MED ORDER — POTASSIUM CHLORIDE CRYS ER 20 MEQ PO TBCR
40.0000 meq | EXTENDED_RELEASE_TABLET | Freq: Once | ORAL | Status: AC
  Administered 2014-04-14: 40 meq via ORAL
  Filled 2014-04-14: qty 2

## 2014-04-14 NOTE — Discharge Instructions (Signed)
Schizophrenia °Schizophrenia is a mental illness. It may cause disturbed or disorganized thinking, speech, or behavior. People with schizophrenia have problems functioning in one or more areas of life: work, school, home, or relationships. People with schizophrenia are at increased risk for suicide, certain chronic physical illnesses, and unhealthy behaviors, such as smoking and drug use. °People who have family members with schizophrenia are at higher risk of developing the illness. Schizophrenia affects men and women equally but usually appears at an earlier age (teenage or early adult years) in men.  °SYMPTOMS °The earliest symptoms are often subtle (prodrome) and may go unnoticed until the illness becomes more severe (first-break psychosis). Symptoms of schizophrenia may be continuous or may come and go in severity. Episodes often are triggered by major life events, such as family stress, college, military service, marriage, pregnancy or child birth, divorce, or loss of a loved one. People with schizophrenia may see, hear, or feel things that do not exist (hallucinations). They may have false beliefs in spite of obvious proof to the contrary (delusions). Sometimes speech is incoherent or behavior is odd or withdrawn.  °DIAGNOSIS °Schizophrenia is diagnosed through an assessment by your caregiver. Your caregiver will ask questions about your thoughts, behavior, mood, and ability to function in daily life. Your caregiver may ask questions about your medical history and use of alcohol or drugs, including prescription medication. Your caregiver may also order blood tests and imaging exams. Certain medical conditions and substances can cause symptoms that resemble schizophrenia. Your caregiver may refer you to a mental health specialist for evaluation. There are three major criterion for a diagnosis of schizophrenia: °· Two or more of the following five symptoms are present for a month or longer: °¨ Delusions. Often  the delusions are that you are being attacked, harassed, cheated, persecuted or conspired against (persecutory delusions). °¨ Hallucinations.   °¨ Disorganized speech that does not make sense to others. °¨ Grossly disorganized (confused or unfocused) behavior or extremely overactive or underactive motor activity (catatonia). °¨ Negative symptoms such as bland or blunted emotions (flat affect), loss of will power (avolition), and withdrawal from social contacts (social isolation). °· Level of functioning in one or more major areas of life (work, school, relationships, or self-care) is markedly below the level of functioning before the onset of illness.   °· There are continuous signs of illness (either mild symptoms or decreased level of functioning) for at least 6 months or longer. °TREATMENT  °Schizophrenia is a long-term illness. It is best controlled with continuous treatment rather than treatment only when symptoms occur. The following treatments are used to manage schizophrenia: °· Medication--Medication is the most effective and important form of treatment for schizophrenia. Antipsychotic medications are usually prescribed to help manage schizophrenia. Other types of medication may be added to relieve any symptoms that may occur despite the use of antipsychotic medications. °· Counseling or talk therapy--Individual, group, or family counseling may be helpful in providing education, support, and guidance. Many people with schizophrenia also benefit from social skills and job skills (vocational) training. °A combination of medication and counseling is best for managing the disorder over time. A procedure in which electricity is applied to the brain through the scalp (electroconvulsive therapy) may be used to treat catatonic schizophrenia or schizophrenia in people who cannot take or do not respond to medication and counseling. °Document Released: 10/12/2000 Document Revised: 06/17/2013 Document Reviewed:  01/07/2013 °ExitCare® Patient Information ©2015 [employer]. This information is not intended to replace advice given to you by   your health care provider. Make sure you discuss any questions you have with your health care provider. ° °

## 2014-04-14 NOTE — ED Notes (Signed)
Pt accepted to [facility] unit at [facility] per [facility]. Attempted to call report at number given; no one answered. Will attempt again.

## 2014-04-14 NOTE — ED Notes (Signed)
[facility] was called to transport to [facility].

## 2014-04-14 NOTE — ED Notes (Signed)
Here under IVC for commitment.  Pt was released from jail today.  Per papers, pt has not been taking meds.  Pt does not answer questions in triage nor makes eye contact.

## 2014-04-14 NOTE — ED Notes (Signed)
Per Roderic Palau at [facility] are holding a bed and are waiting for a return call from [facility] who will be accepting/attending physician to confirm acceptance of pt. Facesheet faxed to [phone].

## 2014-04-14 NOTE — ED Notes (Signed)
Lab called critical K 2.9.  Notified edp

## 2014-04-14 NOTE — ED Notes (Signed)
Per Caryl Pina at [facility] pt accepted to [facility] by Dr. Reece Levy

## 2014-04-14 NOTE — ED Provider Notes (Signed)
CSN: [id number]     Arrival date & time 04/14/14  1148 History   First MD Initiated Contact with Patient 04/14/14 1211     Chief Complaint  Patient presents with  . V70.1     (Consider location/radiation/quality/duration/timing/severity/associated sxs/prior Treatment) The history is provided by the patient and a caregiver. The history is limited by the condition of the patient.   patient here after being discharged from jail this morning. Patient is under IVC commitment due to his noncompliance with his schizophrenic medications. According to the [place] caseworker, this patient is well-known to her and she has already arranged inpatient hospitalization at [facility]. Patient is only here for medical clearance. He does have a history of hypertension and has been compliant with his medications. Patient will not cooperate with any exam at this time.  Past Medical History  Diagnosis Date  . Hypertension     minimal coronary atherosclerosis by CT  . Schizoaffective disorder     With depression  . Atrial fibrillation 06/2012    onset 06/2012; normal TSH; EF-45%  . Overweight   . Gout   . Noncompliance 09/21/2012  . Cholelithiasis     incidental finding on CT in 2013  . Systolic dysfunction   . A-fib   . CHF (congestive heart failure)   . Liver disease    Past Surgical History  Procedure Laterality Date  . Colonoscopy  2006    normal screening examination   Family History  Problem Relation Age of Onset  . Hypertension Mother   . Liver disease Father    History  Substance Use Topics  . Smoking status: Never Smoker   . Smokeless tobacco: Never Used  . Alcohol Use: No     Comment: former     Review of Systems  Unable to perform ROS     Allergies  Review of patient's allergies indicates no known allergies.  Home Medications   Prior to Admission medications   Medication Sig Start Date End Date Taking? Authorizing Ayaka Andes  acetaminophen (TYLENOL) 500 MG tablet Take  1,000 mg by mouth every 6 (six) hours as needed for headache.    Historical Lecil Tapp, MD   BP 230/152  Pulse 98  Temp(Src) 97.8 F (36.6 C) (Oral)  Resp 20  SpO2 98% Physical Exam  Nursing note and vitals reviewed. Constitutional: He is oriented to person, place, and time. He appears well-developed and well-nourished.  Non-toxic appearance. No distress.  HENT:  Head: Normocephalic and atraumatic.  Eyes: Conjunctivae, EOM and lids are normal. Pupils are equal, round, and reactive to light.  Neck: Normal range of motion. Neck supple. No tracheal deviation present. No mass present.  Cardiovascular: Normal rate, regular rhythm and normal heart sounds.  Exam reveals no gallop.   No murmur heard. Pulmonary/Chest: Effort normal and breath sounds normal. No stridor. No respiratory distress. He has no decreased breath sounds. He has no wheezes. He has no rhonchi. He has no rales.  Abdominal: Soft. Normal appearance and bowel sounds are normal. He exhibits no distension. There is no tenderness. There is no rebound and no CVA tenderness.  Musculoskeletal: Normal range of motion. He exhibits no edema and no tenderness.  Neurological: He is alert and oriented to person, place, and time. He has normal strength. No cranial nerve deficit or sensory deficit. GCS eye subscore is 4. GCS verbal subscore is 5. GCS motor subscore is 6.  Skin: Skin is warm and dry. No abrasion and no rash noted.  Psychiatric:  His mood appears anxious. His speech is delayed. He is agitated.    ED Course  Procedures (including critical care time) Labs Review Labs Reviewed  CBC WITH DIFFERENTIAL - Abnormal; Notable for the following:    RBC 6.44 (*)    Hemoglobin 18.0 (*)    MCV 77.6 (*)    All other components within normal limits  COMPREHENSIVE METABOLIC PANEL - Abnormal; Notable for the following:    Potassium 2.9 (*)    Glucose, Bld 103 (*)    GFR calc non Af Amer 58 (*)    GFR calc Af Amer 68 (*)    All other  components within normal limits  PRO B NATRIURETIC PEPTIDE - Abnormal; Notable for the following:    Pro B Natriuretic peptide (BNP) 517.6 (*)    All other components within normal limits  ETHANOL  TROPONIN I  URINE RAPID DRUG SCREEN (HOSP PERFORMED)    Imaging Review No results found.   EKG Interpretation   Date/Time:  Wednesday April 14 2014 13:43:52 EDT Ventricular Rate:  100 PR Interval:    QRS Duration: 100 QT Interval:  370 QTC Calculation: 477 R Axis:   46 Text Interpretation:  Atrial fibrillation Nonspecific ST and T wave  abnormality Prolonged QT Abnormal ECG Confirmed by ALLEN  MD, ANTHONY  ([id number]) on 04/14/2014 2:11:32 PM      MDM   Final diagnoses:  None   Patient and his last doses have medications as prior to arrival. His blood pressure is stable here. He has chronic systolic hypertension. He is stable for discharge to [facility]     Leota Jacobsen, MD 04/14/14 [phone]

## 2014-08-30 ENCOUNTER — Emergency Department: Payer: Self-pay | Admitting: Emergency Medicine

## 2014-08-30 LAB — CBC WITH DIFFERENTIAL/PLATELET
Basophil #: 0 10*3/uL (ref 0.0–0.1)
Basophil %: 0.7 %
Eosinophil #: 0.6 10*3/uL (ref 0.0–0.7)
Eosinophil %: 7.8 %
HCT: 48.1 % (ref 40.0–52.0)
HGB: 15.9 g/dL (ref 13.0–18.0)
LYMPHS ABS: 1.6 10*3/uL (ref 1.0–3.6)
Lymphocyte %: 21.9 %
MCH: 29.4 pg (ref 26.0–34.0)
MCHC: 33.2 g/dL (ref 32.0–36.0)
MCV: 89 fL (ref 80–100)
MONOS PCT: 10.4 %
Monocyte #: 0.7 x10 3/mm (ref 0.2–1.0)
Neutrophil #: 4.2 10*3/uL (ref 1.4–6.5)
Neutrophil %: 59.2 %
Platelet: 145 10*3/uL — ABNORMAL LOW (ref 150–440)
RBC: 5.43 10*6/uL (ref 4.40–5.90)
RDW: 15.4 % — ABNORMAL HIGH (ref 11.5–14.5)
WBC: 7.1 10*3/uL (ref 3.8–10.6)

## 2014-08-30 LAB — BASIC METABOLIC PANEL
ANION GAP: 9 (ref 7–16)
BUN: 13 mg/dL (ref 7–18)
CHLORIDE: 111 mmol/L — AB (ref 98–107)
CREATININE: 1.26 mg/dL (ref 0.60–1.30)
Calcium, Total: 8.3 mg/dL — ABNORMAL LOW (ref 8.5–10.1)
Co2: 22 mmol/L (ref 21–32)
EGFR (Non-African Amer.): >60
Glucose: 138 mg/dL — ABNORMAL HIGH (ref 65–99)
OSMOLALITY: 285 (ref 275–301)
Potassium: 3.7 mmol/L (ref 3.5–5.1)
Sodium: 142 mmol/L (ref 136–145)

## 2014-08-30 LAB — URINALYSIS, COMPLETE
BACTERIA: NONE SEEN
Bilirubin,UR: NEGATIVE
Blood: NEGATIVE
GLUCOSE, UR: NEGATIVE mg/dL (ref 0–75)
Hyaline Cast: 13
Nitrite: NEGATIVE
PH: 5 (ref 4.5–8.0)
Protein: 30
RBC,UR: 10 /HPF (ref 0–5)
Specific Gravity: 1.018 (ref 1.003–1.030)
Squamous Epithelial: NONE SEEN
WBC UR: 77 /HPF (ref 0–5)

## 2014-08-30 LAB — TROPONIN I

## 2014-10-29 ENCOUNTER — Observation Stay: Payer: Self-pay | Admitting: Internal Medicine

## 2014-10-29 LAB — CBC
HCT: 51.7 % (ref 40.0–52.0)
HGB: 17.9 g/dL (ref 13.0–18.0)
MCH: 30.3 pg (ref 26.0–34.0)
MCHC: 34.6 g/dL (ref 32.0–36.0)
MCV: 88 fL (ref 80–100)
Platelet: 141 10*3/uL — ABNORMAL LOW (ref 150–440)
RBC: 5.9 10*6/uL (ref 4.40–5.90)
RDW: 14.7 % — ABNORMAL HIGH (ref 11.5–14.5)
WBC: 7.9 10*3/uL (ref 3.8–10.6)

## 2014-10-29 LAB — COMPREHENSIVE METABOLIC PANEL
ALK PHOS: 68 U/L
AST: 29 U/L (ref 15–37)
Albumin: 3.4 g/dL (ref 3.4–5.0)
Anion Gap: 6 — ABNORMAL LOW (ref 7–16)
BUN: 20 mg/dL — ABNORMAL HIGH (ref 7–18)
Bilirubin,Total: 1 mg/dL (ref 0.2–1.0)
CHLORIDE: 108 mmol/L — AB (ref 98–107)
CO2: 30 mmol/L (ref 21–32)
CREATININE: 1.65 mg/dL — AB (ref 0.60–1.30)
Calcium, Total: 8.8 mg/dL (ref 8.5–10.1)
GFR CALC AF AMER: 55 — AB
GFR CALC NON AF AMER: 45 — AB
Glucose: 62 mg/dL — ABNORMAL LOW (ref 65–99)
Osmolality: 287 (ref 275–301)
Potassium: 3.6 mmol/L (ref 3.5–5.1)
SGPT (ALT): 33 U/L
Sodium: 144 mmol/L (ref 136–145)
Total Protein: 6.4 g/dL (ref 6.4–8.2)

## 2014-10-29 LAB — VALPROIC ACID LEVEL: VALPROIC ACID: 71 ug/mL

## 2014-10-29 LAB — TROPONIN I: Troponin-I: <0.02 ng/mL

## 2014-10-30 LAB — LIPID PANEL
CHOLESTEROL: 74 mg/dL (ref 0–200)
HDL Cholesterol: 26 mg/dL — ABNORMAL LOW (ref 40–60)
Ldl Cholesterol, Calc: 29 mg/dL (ref 0–100)
TRIGLYCERIDES: 94 mg/dL (ref 0–200)
VLDL Cholesterol, Calc: 19 mg/dL (ref 5–40)

## 2014-10-30 LAB — CBC WITH DIFFERENTIAL/PLATELET
Basophil #: 0.1 10*3/uL (ref 0.0–0.1)
Basophil %: 0.7 %
Eosinophil #: 0.5 10*3/uL (ref 0.0–0.7)
Eosinophil %: 5.5 %
HCT: 45 % (ref 40.0–52.0)
HGB: 15.4 g/dL (ref 13.0–18.0)
LYMPHS PCT: 33.9 %
Lymphocyte #: 2.8 10*3/uL (ref 1.0–3.6)
MCH: 30 pg (ref 26.0–34.0)
MCHC: 34.3 g/dL (ref 32.0–36.0)
MCV: 87 fL (ref 80–100)
MONOS PCT: 9.8 %
Monocyte #: 0.8 x10 3/mm (ref 0.2–1.0)
NEUTROS ABS: 4.2 10*3/uL (ref 1.4–6.5)
Neutrophil %: 50.1 %
PLATELETS: 115 10*3/uL — AB (ref 150–440)
RBC: 5.15 10*6/uL (ref 4.40–5.90)
RDW: 14.5 % (ref 11.5–14.5)
WBC: 8.3 10*3/uL (ref 3.8–10.6)

## 2014-10-30 LAB — BASIC METABOLIC PANEL
Anion Gap: 5 — ABNORMAL LOW (ref 7–16)
BUN: 19 mg/dL — ABNORMAL HIGH (ref 7–18)
CHLORIDE: 111 mmol/L — AB (ref 98–107)
CREATININE: 1.23 mg/dL (ref 0.60–1.30)
Calcium, Total: 8.5 mg/dL (ref 8.5–10.1)
Co2: 28 mmol/L (ref 21–32)
Glucose: 75 mg/dL (ref 65–99)
Osmolality: 288 (ref 275–301)
Potassium: 3.4 mmol/L — ABNORMAL LOW (ref 3.5–5.1)
SODIUM: 144 mmol/L (ref 136–145)

## 2014-11-24 ENCOUNTER — Emergency Department: Payer: Self-pay | Admitting: Emergency Medicine

## 2014-11-24 LAB — CBC WITH DIFFERENTIAL/PLATELET
BASOS PCT: 0.4 %
Basophil #: 0 10*3/uL (ref 0.0–0.1)
EOS ABS: 0.1 10*3/uL (ref 0.0–0.7)
Eosinophil %: 1.3 %
HCT: 36.6 % — AB (ref 40.0–52.0)
HGB: 12.3 g/dL — AB (ref 13.0–18.0)
Lymphocyte #: 1 10*3/uL (ref 1.0–3.6)
Lymphocyte %: 10.4 %
MCH: 30.2 pg (ref 26.0–34.0)
MCHC: 33.5 g/dL (ref 32.0–36.0)
MCV: 90 fL (ref 80–100)
Monocyte #: 0.8 x10 3/mm (ref 0.2–1.0)
Monocyte %: 9 %
NEUTROS ABS: 7.2 10*3/uL — AB (ref 1.4–6.5)
Neutrophil %: 78.9 %
Platelet: 147 10*3/uL — ABNORMAL LOW (ref 150–440)
RBC: 4.07 10*6/uL — ABNORMAL LOW (ref 4.40–5.90)
RDW: 14.6 % — ABNORMAL HIGH (ref 11.5–14.5)
WBC: 9.1 10*3/uL (ref 3.8–10.6)

## 2014-11-24 LAB — BASIC METABOLIC PANEL
ANION GAP: 10 (ref 7–16)
BUN: 18 mg/dL (ref 7–18)
CHLORIDE: 108 mmol/L — AB (ref 98–107)
CREATININE: 1.64 mg/dL — AB (ref 0.60–1.30)
Calcium, Total: 8.2 mg/dL — ABNORMAL LOW (ref 8.5–10.1)
Co2: 28 mmol/L (ref 21–32)
GFR CALC AF AMER: 55 — AB
GFR CALC NON AF AMER: 45 — AB
GLUCOSE: 94 mg/dL (ref 65–99)
OSMOLALITY: 292 (ref 275–301)
Potassium: 3.4 mmol/L — ABNORMAL LOW (ref 3.5–5.1)
Sodium: 146 mmol/L — ABNORMAL HIGH (ref 136–145)

## 2014-11-24 LAB — TROPONIN I: Troponin-I: <0.02 ng/mL

## 2014-11-25 LAB — URINALYSIS, COMPLETE
BACTERIA: NONE SEEN
Bilirubin,UR: NEGATIVE
Blood: NEGATIVE
Glucose,UR: NEGATIVE mg/dL (ref 0–75)
Hyaline Cast: 1
KETONE: NEGATIVE
Leukocyte Esterase: NEGATIVE
Nitrite: NEGATIVE
Ph: 6 (ref 4.5–8.0)
Protein: NEGATIVE
RBC,UR: <1 /HPF (ref 0–5)
SPECIFIC GRAVITY: 1.015 (ref 1.003–1.030)
SQUAMOUS EPITHELIAL: NONE SEEN

## 2014-11-25 LAB — BASIC METABOLIC PANEL
Anion Gap: 10 (ref 7–16)
BUN: 27 mg/dL — AB (ref 7–18)
Calcium, Total: 8.4 mg/dL — ABNORMAL LOW (ref 8.5–10.1)
Chloride: 108 mmol/L — ABNORMAL HIGH (ref 98–107)
Co2: 26 mmol/L (ref 21–32)
Creatinine: 1.68 mg/dL — ABNORMAL HIGH (ref 0.60–1.30)
EGFR (African American): 54 — ABNORMAL LOW
GFR CALC NON AF AMER: 44 — AB
GLUCOSE: 105 mg/dL — AB (ref 65–99)
Osmolality: 292 (ref 275–301)
POTASSIUM: 3.6 mmol/L (ref 3.5–5.1)
SODIUM: 144 mmol/L (ref 136–145)

## 2014-11-25 LAB — CBC
HCT: 32.5 % — ABNORMAL LOW (ref 40.0–52.0)
HGB: 11 g/dL — ABNORMAL LOW (ref 13.0–18.0)
MCH: 30.3 pg (ref 26.0–34.0)
MCHC: 33.8 g/dL (ref 32.0–36.0)
MCV: 90 fL (ref 80–100)
Platelet: 154 10*3/uL (ref 150–440)
RBC: 3.63 10*6/uL — AB (ref 4.40–5.90)
RDW: 14.3 % (ref 11.5–14.5)
WBC: 8.4 10*3/uL (ref 3.8–10.6)

## 2014-11-25 LAB — MAGNESIUM: Magnesium: 1.7 mg/dL — ABNORMAL LOW

## 2014-11-25 LAB — TROPONIN I
Troponin-I: <0.02 ng/mL
Troponin-I: <0.02 ng/mL
Troponin-I: <0.02 ng/mL

## 2014-11-25 LAB — VALPROIC ACID LEVEL: VALPROIC ACID: 70 ug/mL

## 2014-11-25 LAB — HEMOGLOBIN: HGB: 9.4 g/dL — ABNORMAL LOW (ref 13.0–18.0)

## 2014-11-26 ENCOUNTER — Inpatient Hospital Stay: Payer: Self-pay | Admitting: Internal Medicine

## 2014-11-26 LAB — CBC WITH DIFFERENTIAL/PLATELET
BASOS ABS: 0 10*3/uL (ref 0.0–0.1)
Basophil %: 0.5 %
EOS PCT: 2.1 %
Eosinophil #: 0.1 10*3/uL (ref 0.0–0.7)
HCT: 24.6 % — ABNORMAL LOW (ref 40.0–52.0)
HGB: 8.2 g/dL — ABNORMAL LOW (ref 13.0–18.0)
LYMPHS ABS: 1.1 10*3/uL (ref 1.0–3.6)
Lymphocyte %: 17.5 %
MCH: 30.2 pg (ref 26.0–34.0)
MCHC: 33.2 g/dL (ref 32.0–36.0)
MCV: 91 fL (ref 80–100)
MONOS PCT: 13.8 %
Monocyte #: 0.9 x10 3/mm (ref 0.2–1.0)
Neutrophil #: 4.1 10*3/uL (ref 1.4–6.5)
Neutrophil %: 66.1 %
PLATELETS: 114 10*3/uL — AB (ref 150–440)
RBC: 2.7 10*6/uL — ABNORMAL LOW (ref 4.40–5.90)
RDW: 14.5 % (ref 11.5–14.5)
WBC: 6.2 10*3/uL (ref 3.8–10.6)

## 2014-11-26 LAB — PROTIME-INR
INR: 1.3
Prothrombin Time: 15.7 secs — ABNORMAL HIGH (ref 11.5–14.7)

## 2014-11-26 LAB — LIPID PANEL
CHOLESTEROL: 63 mg/dL (ref 0–200)
HDL: 26 mg/dL — AB (ref 40–60)
LDL CHOLESTEROL, CALC: 22 mg/dL (ref 0–100)
TRIGLYCERIDES: 76 mg/dL (ref 0–200)
VLDL CHOLESTEROL, CALC: 15 mg/dL (ref 5–40)

## 2014-11-26 LAB — BASIC METABOLIC PANEL
ANION GAP: 6 — AB (ref 7–16)
BUN: 27 mg/dL — AB (ref 7–18)
CREATININE: 1.41 mg/dL — AB (ref 0.60–1.30)
Calcium, Total: 7.9 mg/dL — ABNORMAL LOW (ref 8.5–10.1)
Chloride: 114 mmol/L — ABNORMAL HIGH (ref 98–107)
Co2: 26 mmol/L (ref 21–32)
EGFR (African American): >60
GFR CALC NON AF AMER: 54 — AB
Glucose: 91 mg/dL (ref 65–99)
OSMOLALITY: 295 (ref 275–301)
POTASSIUM: 4.2 mmol/L (ref 3.5–5.1)
Sodium: 146 mmol/L — ABNORMAL HIGH (ref 136–145)

## 2014-11-26 LAB — HEMOGLOBIN: HGB: 8.6 g/dL — AB (ref 13.0–18.0)

## 2014-11-27 LAB — BASIC METABOLIC PANEL
Anion Gap: 6 — ABNORMAL LOW (ref 7–16)
BUN: 21 mg/dL — AB (ref 7–18)
CHLORIDE: 113 mmol/L — AB (ref 98–107)
Calcium, Total: 8.3 mg/dL — ABNORMAL LOW (ref 8.5–10.1)
Co2: 26 mmol/L (ref 21–32)
Creatinine: 1.33 mg/dL — ABNORMAL HIGH (ref 0.60–1.30)
EGFR (African American): >60
GFR CALC NON AF AMER: 58 — AB
GLUCOSE: 93 mg/dL (ref 65–99)
OSMOLALITY: 291 (ref 275–301)
Potassium: 3.4 mmol/L — ABNORMAL LOW (ref 3.5–5.1)
SODIUM: 145 mmol/L (ref 136–145)

## 2014-11-27 LAB — CBC WITH DIFFERENTIAL/PLATELET
Basophil #: 0 10*3/uL (ref 0.0–0.1)
Basophil %: 0.7 %
EOS ABS: 0.3 10*3/uL (ref 0.0–0.7)
Eosinophil %: 4.4 %
HCT: 27.2 % — AB (ref 40.0–52.0)
HGB: 9 g/dL — AB (ref 13.0–18.0)
Lymphocyte #: 1.2 10*3/uL (ref 1.0–3.6)
Lymphocyte %: 19.3 %
MCH: 30.7 pg (ref 26.0–34.0)
MCHC: 33.2 g/dL (ref 32.0–36.0)
MCV: 93 fL (ref 80–100)
MONO ABS: 0.7 x10 3/mm (ref 0.2–1.0)
Monocyte %: 10.8 %
NEUTROS ABS: 4.1 10*3/uL (ref 1.4–6.5)
Neutrophil %: 64.8 %
Platelet: 126 10*3/uL — ABNORMAL LOW (ref 150–440)
RBC: 2.94 10*6/uL — AB (ref 4.40–5.90)
RDW: 15.3 % — AB (ref 11.5–14.5)
WBC: 6.3 10*3/uL (ref 3.8–10.6)

## 2014-11-28 LAB — BASIC METABOLIC PANEL
Anion Gap: 2 — ABNORMAL LOW (ref 7–16)
BUN: 17 mg/dL (ref 7–18)
CALCIUM: 7.9 mg/dL — AB (ref 8.5–10.1)
CO2: 26 mmol/L (ref 21–32)
CREATININE: 1.17 mg/dL (ref 0.60–1.30)
Chloride: 117 mmol/L — ABNORMAL HIGH (ref 98–107)
EGFR (Non-African Amer.): >60
GLUCOSE: 81 mg/dL (ref 65–99)
OSMOLALITY: 289 (ref 275–301)
Potassium: 3.6 mmol/L (ref 3.5–5.1)
SODIUM: 145 mmol/L (ref 136–145)

## 2014-11-28 LAB — CBC WITH DIFFERENTIAL/PLATELET
BASOS PCT: 0.8 %
Basophil #: 0 10*3/uL (ref 0.0–0.1)
EOS ABS: 0.4 10*3/uL (ref 0.0–0.7)
Eosinophil %: 6.6 %
HCT: 24.2 % — AB (ref 40.0–52.0)
HGB: 8.1 g/dL — AB (ref 13.0–18.0)
LYMPHS ABS: 1.3 10*3/uL (ref 1.0–3.6)
Lymphocyte %: 24 %
MCH: 30.7 pg (ref 26.0–34.0)
MCHC: 33.4 g/dL (ref 32.0–36.0)
MCV: 92 fL (ref 80–100)
MONO ABS: 0.7 x10 3/mm (ref 0.2–1.0)
Monocyte %: 11.8 %
NEUTROS ABS: 3.1 10*3/uL (ref 1.4–6.5)
Neutrophil %: 56.8 %
Platelet: 121 10*3/uL — ABNORMAL LOW (ref 150–440)
RBC: 2.63 10*6/uL — ABNORMAL LOW (ref 4.40–5.90)
RDW: 14.8 % — ABNORMAL HIGH (ref 11.5–14.5)
WBC: 5.5 10*3/uL (ref 3.8–10.6)

## 2014-11-29 LAB — CBC WITH DIFFERENTIAL/PLATELET
BASOS ABS: 0 10*3/uL (ref 0.0–0.1)
Basophil %: 0.4 %
EOS ABS: 0.2 10*3/uL (ref 0.0–0.7)
Eosinophil %: 2.7 %
HCT: 27.2 % — ABNORMAL LOW (ref 40.0–52.0)
HGB: 9.1 g/dL — AB (ref 13.0–18.0)
LYMPHS PCT: 12 %
Lymphocyte #: 0.9 10*3/uL — ABNORMAL LOW (ref 1.0–3.6)
MCH: 30.9 pg (ref 26.0–34.0)
MCHC: 33.5 g/dL (ref 32.0–36.0)
MCV: 92 fL (ref 80–100)
MONOS PCT: 10 %
Monocyte #: 0.7 x10 3/mm (ref 0.2–1.0)
NEUTROS PCT: 74.9 %
Neutrophil #: 5.5 10*3/uL (ref 1.4–6.5)
Platelet: 139 10*3/uL — ABNORMAL LOW (ref 150–440)
RBC: 2.95 10*6/uL — AB (ref 4.40–5.90)
RDW: 15 % — ABNORMAL HIGH (ref 11.5–14.5)
WBC: 7.4 10*3/uL (ref 3.8–10.6)

## 2014-11-30 LAB — HEMOGLOBIN: HGB: 9 g/dL — ABNORMAL LOW (ref 13.0–18.0)

## 2015-02-23 NOTE — Discharge Summary (Signed)
PATIENT NAME:  Todd Page, Todd Page MR#:  [id number] DATE OF BIRTH:  1951/02/25  DATE OF ADMISSION:  10/29/2014 DATE OF DISCHARGE:  10/30/2014  PRIMARY CARE PHYSICIAN: Meindert A. Brunetta Genera, MD  DISCHARGE DIAGNOSES:   1.  Syncope, possibly due to acute renal failure and dehydration.  2.  Atrial fibrillation. 3.  Hypertension.  4.  Depression.   CONDITION: Stable.   CODE STATUS: Full Code.   HOME MEDICATIONS: Please refer to the medication reconciliation list.   DIET: Low-sodium diet.   ACTIVITY: As tolerated.   FOLLOWUP CARE: Follow up with PCP within 1-2 weeks.   REASON FOR ADMISSION: Passing out.   HOSPITAL COURSE: The patient is a 64 year old Caucasian male with a history of hypertension, depression, and AFib, who was sent from home to ED due to dizziness and passing out. For detailed history and physical examination, please refer to the admission note dictated by Dr. Loletha Grayer.   The patient's blood pressure was a little bit low with elevated creatinine. The patient's creatinine was 1.65, BUN 20. CBC was in normal range. Electrolytes were normal. EKG showed AFib at 74 beats per minute.  1.  Syncope, likely secondary to orthostatic hypotension and dehydration. With IV fluid support, the patient's symptoms improved. The patient also got a carotid duplex, which was negative. Echocardiogram is pending.  2.  For acute renal failure and dehydration, the patient did get normal saline IV. Creatinine decreased to 1.23.  3.  Atrial fibrillation is controlled with amiodarone and Lopressor and Eliquis.  4.  The patient has no complaints. Vital signs are stable.   PHYSICAL EXAMINATION: Unremarkable.   The patient is clinically stable and will be discharged back to his group home today.   I discussed patient's discharge plan with the patient's nurse.   TIME SPENT: About 38 minutes.    ____________________________ Demetrios Loll, MD qc:MT D: 10/30/2014 11:58:11 ET T: 10/30/2014 12:15:52  ET JOB#: [id number]  cc: Demetrios Loll, MD, <Dictator> Demetrios Loll MD ELECTRONICALLY SIGNED 10/30/2014 12:49

## 2015-02-23 NOTE — H&P (Signed)
PATIENT NAME:  Todd Page, Todd Page MR#:  [id number] DATE OF BIRTH:  [birth year]  DATE OF ADMISSION:  10/29/2014  PRIMARY CARE PHYSICIAN:  Dionisio David, MD  CHIEF COMPLAINT: " I passed out."   HISTORY OF PRESENT ILLNESS: This is a 64 year old man who stated he felt dizzy prior to passing out. He was walking from one room to another. He was able to brace himself, as he fell, with his arms. Did not hit his head and was on the ground for a period of time. He lives over at a group home, and he was brought into the ER for further evaluation. Currently. He feels a little tired. No complaints of chest pain. Occasional shortness of breath. Last month, he had an episode where he saw some spots and he had a fall at that time. In the ER, his blood pressure was found a little low and his creatinine was a little bit elevated, and his sugar initially was 64 when he came in. Hospitalist services were contacted for further evaluation.   PAST MEDICAL HISTORY: Hypertension, depression, at a psych home, atrial fibrillation.   PAST SURGICAL HISTORY: Possible valve surgery.   ALLERGIES: No known drug allergies.   MEDICATIONS: As per prescription writer, include: An amiodarone taper, aspirin 81 mg daily, atorvastatin 40 mg at bedtime, clonidine 0.1 mg twice a day, Depakote 500 mg 2 tablets at bedtime, Eliquis 5 mg twice a day, hydroxyzine 25 mg every 6 hours as needed, Invega 156 mg/mL 1 injection once a month. Metoprolol 25 mg twice a day, olanzapine 10 mg at bedtime, potassium chloride 10 mEq daily, risperidone 2 mg twice a day, Savaysa 60 mg daily, trazodone 50 mg at bedtime as needed.   SOCIAL HISTORY: Currently at a group home. No smoking. No alcohol. No drug use. Used to work in a [employer].   FAMILY HISTORY: Father had stomach ulcer issues. Mother, 44, good health.   REVIEW OF SYSTEMS:   CONSTITUTIONAL: Positive for weight gain. No fever, chills, or sweats. Positive for fatigue.   EYES: He does wear reading glasses,  occasionally sees spots before passing out.  EARS, NOSE, MOUTH, AND THROAT: Decreased hearing. No sore throat. Positive for runny nose.  CARDIOVASCULAR: No chest pain. No palpitations.  RESPIRATORY: Positive for shortness of breath. No cough. No sputum. No hemoptysis.  GASTROINTESTINAL: No nausea. No vomiting. No abdominal pain. No diarrhea. No constipation. No bright red blood per rectum. No melena.  GENITOURINARY: No burning on urination or hematuria.  MUSCULOSKELETAL: No joint pain or muscle pain.  INTEGUMENT: No rashes or eruptions.  NEUROLOGIC: Positive for fainting.  PSYCHIATRIC: On medications for mood disorder.  ENDOCRINE: No thyroid problems.  HEMATOLOGIC AND LYMPHATIC: No anemia. No easy bruising or bleeding.   PHYSICAL EXAMINATION: VITAL SIGNS: Blood pressure was 113/86, pulse 94, respirations 20, temperature 98, pulse 88. They did orthostatic vital signs: Blood pressure 151/100 lying, sitting was 138/103, standing 99/84.  GENERAL: No respiratory distress.  EYES: Conjunctivae and lids normal. Pupils equal, round, and reactive to light. Extraocular muscles intact. No nystagmus.  EARS, NOSE, MOUTH, AND THROAT: Tympanic membranes: No erythema. Nasal mucosa: No erythema. Throat: No erythema. No exudate seen. Lips and gums: No lesions.  NECK: No JVD. No bruits. No lymphadenopathy. No thyromegaly. No thyroid nodules palpated.  RESPIRATORY: Lungs: Clear to auscultation. No use of accessory muscles to breathe. No rhonchi, rales, or wheeze heard.  CARDIOVASCULAR SYSTEM: S1, S2 normal. No gallops, rubs, or murmurs heard. Carotid upstroke 2+ bilaterally. No bruits.  Dorsalis pedis pulses: 2+ bilaterally, 2+ edema bilateral lower extremity.  ABDOMEN: Soft, nontender. No organomegaly/splenomegaly. Normoactive bowel sounds. No masses felt.  LYMPHATIC: No lymph nodes in the neck.  MUSCULOSKELETAL: No clubbing, edema, or cyanosis.  SKIN: No rashes or ulcers seen.  NEUROLOGIC: Cranial nerves II  through XII: Grossly intact. Deep tendon reflexes: 2+ bilateral lower extremities.   PSYCHIATRIC: The patient is oriented to person and place.   LABORATORY AND RADIOLOGICAL DATA: White blood count 7.9, H and H 17.9 and 51.4, platelet count of 141, glucose 62, BUN 20, creatinine 1.65, sodium 141, potassium 3.6, chloride 108, CO2 of 30, calcium 8.8. Liver function tests: Normal range. Troponin: Negative.  Chest x-ray: No active disease. Repeat sugar fingerstick: 88. EKG: Atrial fibrillation 74 beats minutes with T-waves laterally.   ASSESSMENT AND PLAN: 1. Syncope. Likely secondary to orthostatic hypotension and dehydration. Will admit on telemetry. Check an echo and carotid ultrasound, and give IV fluid hydration. Physical therapy consultation. Hopefully, the patient will be discharged tomorrow.  2. Atrial fibrillation. In speaking with the pharmacy tech, they mentioned that the [facility] was not marked since the 27th, so I am not sure where he stands on his amiodarone taper. I will give 200 mg twice a day. Hopefully, can decrease to 200 mg daily upon discharge. Continue Eliquis and metoprolol, currently rate controlled.  3. Dehydration and acute renal insufficiency. We will give IV fluid hydration and continue to monitor.  4. Mood disorder on numerous psychiatric medications. I will check a Depakote level, because sometimes Depakote when mix with aspirin can increase the Depakote level. The Depakote level is still pending at this time.  5. History of hypertension, but the patient is orthostatic. I will decrease the clonidine dose down to 0.1 mg daily, instead of twice a day. Need to continue metoprolol secondary to rate control at this point. Continue to monitor closely.   TIME SPENT ON ADMISSION: 55 minutes.    ____________________________ Tana Conch. Leslye Peer, MD rjw:mw D: 10/29/2014 16:06:06 ET T: 10/29/2014 16:29:04 ET JOB#: [id number]  cc: Tana Conch. Leslye Peer, MD, <Dictator> Dionisio David,  MD Marisue Brooklyn MD ELECTRONICALLY SIGNED 11/07/2014 15:08

## 2015-02-27 NOTE — H&P (Signed)
PATIENT NAME:  Todd Page, Todd Page MR#:  [id number] DATE OF BIRTH:  [birth year]  DATE OF ADMISSION:  11/25/2014  CHIEF COMPLAINT: " I'm dehydrated. "  HISTORY OF PRESENT ILLNESS: This is a 64 year old man who states that he has been dehydrated and his kidneys are not working. He states that he has urinated only a little bit a few days ago. He has been having falls at home, at least 6 falls recently, 1 yesterday and 1 today. He states that he does not pass out, but he has been falling. He has been very weak. He has not been eating and drinking that much. In the ER, he was found to be orthostatic and unable to stand up and hospitalist services were contacted for further evaluation.   PAST MEDICAL HISTORY: Atrial fibrillation, hypertension, valve replacement, schizophrenia and depression.   PAST SURGICAL HISTORY: Valve replacement.   ALLERGIES: No known drug allergies.   MEDICATIONS:  Include acetaminophen 325 mg 2 tablets every 4 hours as needed, amiodarone 200 mg twice a day, aspirin 81 mg daily, atorvastatin 40 mg at bedtime, clonidine 0.1 mg twice a day, Depakote 500 mg 2 tablets at bedtime, Eliquis 5 mg twice a day, hydroxyzine hydrochloride 25 mg every 6 hours as needed, Invega IM injection once a month, metoprolol 25 mg twice a day, olanzapine 15 mg at bedtime, potassium chloride 10 mEq extended-release daily, Risperdal 2 mg twice a day, trazodone 50 mg at bedtime as needed.   SOCIAL HISTORY: No smoking. No alcohol. No drug use. Lives at a group home.   FAMILY HISTORY: Mother, 70, good health. Father died at age 74, internal ulcerations.   REVIEW OF SYSTEMS: CONSTITUTIONAL: Positive for weight gain. No fever, chills, or sweats. Positive for fatigue.  EYES: He does have blurry vision.  EARS, NOSE, MOUTH AND THROAT: No sore throat. No difficulty swallowing.  CARDIOVASCULAR: No chest pain. Positive for palpitations.  RESPIRATORY: Occasional shortness of breath. No cough. No sputum. No hemoptysis.   GASTROINTESTINAL: Positive for constipation. No nausea. No vomiting. No abdominal pain. No diarrhea. No bright red blood per rectum. No melena.  GENITOURINARY: No burning on urination, no hematuria, decreased urination. Urinated a few days ago, only a small amount.  MUSCULOSKELETAL: Positive for arthritis in the right knee.  INTEGUMENT: No rashes or eruptions.  NEUROLOGIC: Falling with orthostatic hypotension.  PSYCHIATRIC: On medication for schizophrenia and depression.  ENDOCRINE: No thyroid problems.  HEMATOLOGIC AND LYMPHATIC: No anemia, no easy bruising or bleeding.   PHYSICAL EXAMINATION:  VITAL SIGNS: Pulse lying down 121, blood pressure lying down 112/82, pulse sitting 126, sitting blood pressure 89/64, temperature 98.5, pulse oxygen 100% on room air.  GENERAL: No respiratory distress.  EYES: Conjunctivae and lids normal. Pupils equal, round, and reactive to light. Extraocular muscles intact. No nystagmus.  EARS, NOSE, MOUTH AND THROAT: Tympanic membranes, no erythema. Nasal mucosa with erythema. Throat no erythema. No exudate seen. Lips and gums: No lesions.  NECK: No JVD. No bruits. No lymphadenopathy. No thyromegaly. No thyroid nodules palpated.  LUNGS: Clear to auscultation. No use of accessory muscles to breathe. No rhonchi, rales, or wheeze heard.  CARDIOVASCULAR: S1 and S2 tachycardic, irregular regular. No gallops, rubs, or murmurs heard. Carotid upstroke 2+ bilaterally. No bruits.  EXTREMITIES: Dorsalis pedis pulses 2+ bilaterally, 2+ edema bilateral lower extremity.  ABDOMEN: Soft, nontender. No organomegaly/splenomegaly. Normoactive bowel sounds. No masses felt.  LYMPHATIC: No lymph nodes in the neck.  MUSCULOSKELETAL: No clubbing, 2+ edema. No cyanosis.  SKIN: No rashes  or ulcers seen.  NEUROLOGIC: Cranial nerves 2 through 12 grossly intact. Deep tendon reflexes 2+ bilateral lower extremities.  PSYCHIATRIC: The patient is oriented to person, place, and time.    DIAGNOSTIC DATA: Chest x-ray negative.   Troponin negative. White blood cell count 8.4, hemoglobin and hematocrit 11.0 and 32.5, platelet count of 154,000. Glucose 105, BUN 27, creatinine 1.68, sodium 144, potassium 3.6, chloride 108, CO2 of 26, calcium 8.4. I added on a magnesium and it was low at 1.7.   EKG: Atrial fibrillation 116 beats per minute.   ASSESSMENT AND PLAN:  1. Questionable syncope with repeated falls, likely secondary to orthostatic hypotension. The patient received 2 liters of IV fluids here in the hospital. I will give another liter wide open at 125 mL/h. I had the nurse do a bladder scan since the patient has not urinated much and there is only 37 mL in the bladder, this is likely from dehydration. The patient's blood pressure was variable here in the emergency room, so I will have to give metoprolol. I will hold the clonidine at this point and continue to monitor closely. Of note, the patient did have a recent hospitalization with similar presentation back on 10/29/2014.  2. Rapid atrial fibrillation. We will give metoprolol stat and 25 mg t.i.d. and stat amiodarone and 200 mg, will decrease the dose to 200 mg daily. Patient is anticoagulated with Eliquis. 3. Renal insufficiency, acute and dehydration. We will give vigorous IV fluid hydration and continue to monitor.  4. Schizophrenia and depression. Continue psychiatric medications. I will check a Depakote level because sometimes Depakote level can increase with aspirin.  5. Hypokalemia and hypomagnesemia. Will replace magnesium IV and potassium orally and continue to monitor.  TIME SPENT ON ADMISSION: 55 minutes.   The patient will be initially brought in as an observation.    ____________________________ Tana Conch. Leslye Peer, MD rjw:kl D: 11/25/2014 12:59:15 ET T: 11/25/2014 13:21:58 ET JOB#: [id number]  cc: Tana Conch. Leslye Peer, MD, <Dictator> Dionisio David, MD Marisue Brooklyn MD ELECTRONICALLY SIGNED 11/26/2014  14:44

## 2015-02-27 NOTE — Discharge Summary (Signed)
PATIENT NAME:  Todd Page, Todd Page MR#:  [id number] DATE OF BIRTH:  12-26-1950  DATE OF ADMISSION:  11/25/2014 DATE OF DISCHARGE:  11/30/2014  For a detailed note, please take a look at the history and physical done on admission by Dr. Loletha Grayer.   DISCHARGE DIAGNOSES:  1.  Acute posthemorrhagic anemia secondary to gluteal hematoma. 2.  Status post recurrent falls and right thigh/gluteal hematoma, much improved. 3.  Chronic atrial fibrillation. 4.  Schizophrenia. 5.  Depression. 6.  Hypertension. 7.  Hyperlipidemia.   DIET: The patient is being discharged on a low-sodium, low-fat diet.   ACTIVITY: As tolerated.   DISCHARGE FOLLOWUP: Follow up with the primary care physician at the skilled nursing facility.   DISCHARGE MEDICATIONS: Potassium 10 mEq daily, Risperdal 2 mg b.i.d., atorvastatin 40 mg at bedtime, Invega Sustenna 156 mg intramuscular monthly, hydroxyzine 25 mg q. 6 hours as needed, trazodone 50 mg at bedtime as needed, Depakote 500 mg 2 tabs at bedtime, amiodarone 200 mg b.i.d., olanzapine 15 mg at bedtime, Tylenol 650 q. 4 hours as needed, aspirin 81 mg daily, metoprolol tartrate 50 mg b.i.d.   CONSULTANTS DURING HOSPITAL COURSE: Dr. Earnestine Leys from orthopedics.   PERTINENT STUDIES DONE DURING THE HOSPITAL COURSE: A chest x-ray done on admission showing cardiomegaly with low lung volumes. No acute cardiopulmonary disease.   X-ray of the right hip showing no acute fracture.  An ultrasound of the right lower extremity showing no sonographic evidence of DVT. Heterogeneous fluid collection or soft tissue in the right popliteal fossa. Differential includes complex Baker cyst which could contain internal debris or blood products. Alternatively, this could also represent a focal fluid collection not related to the joint, or localized hematoma. MRI is to be considered.   The patient underwent a MRI of the right femur with contrast showing there is muscle edema within the right gluteus  medius muscle most concerning for muscle strain. There is edema throughout the gluteus maximus muscle with partial tear of the midportion of the gluteus maximus muscle with an intramuscular hematoma, which is difficult to measure. There is edema within the mid right rectus intermedius muscle with intramuscular hematoma with a high grade partial thickness muscle tear.   HOSPITAL COURSE: This is a 64 year old male with medical problems as mentioned above who presented to the hospital with recurrent falls and right hip and neck pain.  1.  Acute posthemorrhagic anemia. This was likely secondary to the intramuscular hematoma and muscle tear, which was noticed on the MRI of the right femur that was obtained during the hospital course. The patient's hemoglobin never really trended down below 8. He did not need to get transfused. The hematoma has now been stable requiring no surgical intervention. His hemoglobin, as mentioned, is stable and he does not show any evidence of worsening bruising or edema in his right leg and therefore is being discharged to a skilled nursing facility.  2.  Hematoma to the right thigh. The patient had a MRI of the right hip confirming a gluteal intramuscular hematoma. There was some initial concern for compartment syndrome, although the patient was seen by orthopedics. They did not think the patient had a compartment syndrome. They recommended localized warm compress and keeping the leg elevated and avoiding anticoagulants. The patient was on Eliquis for chronic A-fib, which was stopped. The patient's swelling in his right thigh has improved. His bruising is improved. He still has some pain in that area and limited mobility. Therefore, he is being discharged to  a skilled nursing facility for further care.  3.  Acute renal failure. This was likely prerenal and related to volume loss. The patient has been hydrated with IV fluids and renal function is back to baseline now.  4.  Hypernatremia.  This is also possibly related to dehydration. It has now resolved with IV fluid hydration.  5.  Chronic atrial fibrillation. The patient's rates were somewhat uncontrolled. His metoprolol dose has been increased. He will continue that along with his amiodarone. Due to his high fall risk, I have taken him off the Eliquis and just placing him on aspirin for now.  6.  Syncope. This was likely secondary to orthostasis from dehydration and acute blood loss anemia. The patient has had no further episodes of syncope in the hospital and his orthostasis is improved.  7.  Thrombocytopenia. This was likely consumption secondary to the hematoma. This has also improved and now resolved.   CODE STATUS: The patient is a FULL code.   DISPOSITION: He is being discharged to a skilled nursing facility for ongoing care.   TIME SPENT: 40 minutes.   ____________________________ Belia Heman. Verdell Carmine, MD vjs:sb D: 11/30/2014 14:30:56 ET T: 11/30/2014 14:53:54 ET JOB#: [id number]  cc: Belia Heman. Verdell Carmine, MD, <Dictator> Henreitta Leber MD ELECTRONICALLY SIGNED 12/11/2014 12:51

## 2015-02-27 NOTE — Consult Note (Signed)
Brief Consult Note: Diagnosis: Right thigh swelling.   Patient was seen by consultant.   Recommend further assessment or treatment.   Orders entered.   Comments: 64 year old male admitted for dehydration, falls, rapid atrial fibrillation.  Seen to have swelling in right thigh and consult ordered.  X-rays of hip and femur negative for fracture.  Not much pain.  Was on Eliquis which has been stopped temporarily.    Exam:  Alert and cooperative.  Right thigh swollen without signs of hematoma. Hip and knee motion normal.  No pain or reddness to exam.  circulation/sensation/motor function good distally.  skin intact.  X-rays: as above  Imp:  Right thigh swelling secondary to several falls.  No fracture identified  Rx:  Physical Therapy for ambulation        Kpad right thigh.  Electronic Signatures: Park Breed (MD)  (Signed 30-Jan-16 16:07)  Authored: Brief Consult Note   Last Updated: 30-Jan-16 16:07 by Park Breed (MD)

## 2016-06-20 NOTE — Progress Notes (Signed)
Cardiology Office Note   Date:  06/21/2016   ID:  Todd Page, DOB 1951-03-16, MRN [id number]  PCP:  Cleda Mccreedy, MD  Cardiologist:   Jenkins Rouge, MD   No chief complaint on file.     History of Present Illness: Todd Page is a 65 y.o. male who presents for evaluation of atrial fibrillation.  History of Schizophrenia, Depression, HTN and elevated lipids.  Has been on anticoagulation in past coumadin and eliquis Had fall with left gluteal and thigh hematoma in 11/30/14   Echo 03/18/14 Reviewed  Study Conclusions  - Left ventricle: The cavity size was normal. Wall thickness was increased in a pattern of severe LVH. Systolic function was normal. The estimated ejection fraction was in the range of 50% to 55%. The study was not technically sufficient to allow evaluation of LV diastolic dysfunction due to atrial fibrillation. Doppler parameters are consistent with high ventricular filling pressure. - Aortic valve: Mildly calcified annulus. Trileaflet; mildly thickened leaflets. There was no stenosis. There was mild regurgitation. - Mitral valve: There was mild to moderate regurgitation. - Left atrium: The atrium was severely dilated. - Tricuspid valve: There was mild regurgitation. - Pulmonary arteries: PA peak pressure: 38 mm Hg (S). Mildly elevated pulmonary pressures. - Pericardium, extracardiac: A trivial pericardial effusion was identified.  Patient has no family visiting him at [facility].  He is in wheel chair mostly Has no insight into illness  No chest pain dyspnea palpitations or syncope   Past Medical History:  Diagnosis Date  . A-fib   . Atrial fibrillation 06/2012   onset 06/2012; normal TSH; EF-45%  . CHF (congestive heart failure)   . Cholelithiasis    incidental finding on CT in 2013  . Gout   . Hypertension    minimal coronary atherosclerosis by CT  . Liver disease   . Noncompliance 09/21/2012  . Overweight(278.02)   .  Schizoaffective disorder    With depression  . Systolic dysfunction     Past Surgical History:  Procedure Laterality Date  . COLONOSCOPY  2006   normal screening examination     Current Outpatient Prescriptions  Medication Sig Dispense Refill  . amLODipine (NORVASC) 5 MG tablet Take 5 mg by mouth daily.    Marland Kitchen aspirin EC 81 MG tablet Take 81 mg by mouth daily.    Marland Kitchen atorvastatin (LIPITOR) 40 MG tablet Take 40 mg by mouth daily at 6 PM.    . cholecalciferol (VITAMIN D) 400 units TABS tablet Take 5,000 Units by mouth daily.    . divalproex (DEPAKOTE) 125 MG DR tablet Take 500 mg by mouth 2 (two) times daily.    Marland Kitchen FLUoxetine (PROZAC) 20 MG tablet Take 20 mg by mouth daily.    Marland Kitchen HYDROcodone-acetaminophen (NORCO/VICODIN) 5-325 MG tablet Take 1 tablet by mouth every 6 (six) hours as needed for moderate pain.    Marland Kitchen lisinopril (PRINIVIL,ZESTRIL) 5 MG tablet Take 5 mg by mouth daily.    . metoprolol (LOPRESSOR) 50 MG tablet Take 50 mg by mouth 2 (two) times daily.    Marland Kitchen OLANZapine (ZYPREXA) 15 MG tablet Take 15 mg by mouth at bedtime.    . potassium chloride (MICRO-K) 10 MEQ CR capsule Take 10 mEq by mouth daily.    . risperiDONE (RISPERDAL) 0.5 MG tablet Take 0.5 mg by mouth at bedtime.    . senna (SENOKOT) 8.6 MG tablet Take 1 tablet by mouth daily.     No current facility-administered medications  for this visit.     Allergies:   Review of patient's allergies indicates no known allergies.    Social History:  The patient  reports that he has never smoked. He has never used smokeless tobacco. He reports that he does not drink alcohol or use drugs.   Family History:  The patient's family history includes Hypertension in his mother; Liver disease in his father.    ROS:  Please see the history of present illness.   Otherwise, review of systems are positive for none.   All other systems are reviewed and negative.    PHYSICAL EXAM: VS:  BP 110/60 (BP Location: Right Arm, Patient Position:  Sitting, Cuff Size: Normal)   Pulse 77   Ht 5\' 11"  (1.803 m)   Wt 220 lb (99.8 kg) Comment: unable to stand to weigh- weight reported by paper work from  SpO2 96%   BMI 30.68 kg/m  , BMI Body mass index is 30.68 kg/m. Affect appropriate Pale elderly male in wheel chair  HEENT: normal Neck supple with no adenopathy JVP normal no bruits no thyromegaly Lungs clear with no wheezing and good diaphragmatic motion Heart:  S1/S2 no murmur, no rub, gallop or click PMI normal Abdomen: benighn, BS positve, no tenderness, no AAA no bruit.  No HSM or HJR Distal pulses intact with no bruits No edema Neuro non-focal Skin warm and dry No muscular weakness    EKG:   11/25/14  afib rate 119  Nonspecific ST changes    Recent Labs: No results found for requested labs within last 8760 hours.    Lipid Panel    Component Value Date/Time   CHOL 63 11/26/2014 0816   TRIG 76 11/26/2014 0816   HDL 26 (L) 11/26/2014 0816   VLDL 15 11/26/2014 0816   LDLCALC 22 11/26/2014 0816      Wt Readings from Last 3 Encounters:  06/21/16 220 lb (99.8 kg)  03/20/14 257 lb 11.5 oz (116.9 kg)  09/25/13 300 lb (136.1 kg)      Other studies Reviewed: Additional studies/ records that were reviewed today include: Notes From Dr Jeannette [place] .    ASSESSMENT AND PLAN:  1.  Afib:  Reviewed his records from [name] and clearly at high risk for bleeding recurrent falls No anticoagulation . Rate control is fine ASA 2. HTN:  Well controlled.  Continue current medications and low sodium Dash type diet.   3. Schizophrenia;  Seems more on the sedate side f/u with primary continue current meds    Current medicines are reviewed at length with the patient today.  The patient does not have concerns regarding medicines.  The following changes have been made:  no change  Labs/ tests ordered today include: none  Orders Placed This Encounter  Procedures  . EKG 12-Lead     Disposition:   FU with  primary      Signed, Jenkins Rouge, MD  06/21/2016 1:32 PM    [facility] [address], [place], [place]  [address] Phone: [phone]; Fax: [phone]

## 2016-06-21 ENCOUNTER — Ambulatory Visit (INDEPENDENT_AMBULATORY_CARE_PROVIDER_SITE_OTHER): Payer: Medicare Other | Admitting: Cardiovascular Disease

## 2016-06-21 VITALS — BP 110/60 | HR 77 | Ht 71.0 in | Wt 220.0 lb

## 2016-06-21 DIAGNOSIS — I4891 Unspecified atrial fibrillation: Secondary | ICD-10-CM | POA: Diagnosis not present

## 2016-06-21 NOTE — Patient Instructions (Signed)
Your physician recommends that you schedule a follow-up appointment in:  As needed with Dr Liliane Channel    Your physician recommends that you continue on your current medications as directed. Please refer to the Current Medication list given to you today.    Thank you for choosing [facility] !

## 2016-07-10 ENCOUNTER — Telehealth: Payer: Self-pay | Admitting: Cardiovascular Disease

## 2016-07-10 NOTE — Telephone Encounter (Signed)
Will send to Medical Records. Patient is not prescribed amiodarone.

## 2016-07-10 NOTE — Telephone Encounter (Signed)
New Message  Victorino December from [facility] voiced pt was seen in our office on 8.24 and wondering if pt needs to continue with medication Amiodarone.  Please follow up with Victorino December.

## 2016-07-11 NOTE — Telephone Encounter (Signed)
I am not able to take care of this request, I cannot discuss Medications.

## 2016-07-13 NOTE — Telephone Encounter (Signed)
There is no number to call Todd Page with [facility] back.

## 2016-09-03 ENCOUNTER — Other Ambulatory Visit (HOSPITAL_COMMUNITY): Payer: Self-pay | Admitting: Nurse Practitioner

## 2016-09-03 DIAGNOSIS — R296 Repeated falls: Secondary | ICD-10-CM

## 2016-09-03 DIAGNOSIS — R531 Weakness: Secondary | ICD-10-CM

## 2016-09-11 ENCOUNTER — Ambulatory Visit (HOSPITAL_COMMUNITY)
Admission: RE | Admit: 2016-09-11 | Discharge: 2016-09-11 | Disposition: A | Discharge status: Home / Self Care | Payer: Medicare Other | Source: Ambulatory Visit | Attending: Nurse Practitioner | Admitting: Nurse Practitioner

## 2016-09-11 DIAGNOSIS — I6782 Cerebral ischemia: Secondary | ICD-10-CM | POA: Diagnosis not present

## 2016-09-11 DIAGNOSIS — R531 Weakness: Secondary | ICD-10-CM | POA: Insufficient documentation

## 2016-09-11 DIAGNOSIS — G319 Degenerative disease of nervous system, unspecified: Secondary | ICD-10-CM | POA: Insufficient documentation

## 2016-09-11 DIAGNOSIS — R296 Repeated falls: Secondary | ICD-10-CM

## 2018-01-25 ENCOUNTER — Encounter (HOSPITAL_COMMUNITY): Payer: Self-pay

## 2018-01-25 ENCOUNTER — Other Ambulatory Visit: Payer: Self-pay

## 2018-01-25 ENCOUNTER — Emergency Department (HOSPITAL_COMMUNITY): Payer: Medicare Other

## 2018-01-25 ENCOUNTER — Inpatient Hospital Stay (HOSPITAL_COMMUNITY)
Admission: EM | Admit: 2018-01-25 | Discharge: 2018-01-31 | DRG: 300 | Disposition: A | Discharge status: Skilled Nursing Facility | Payer: Medicare Other | Attending: Cardiothoracic Surgery | Admitting: Cardiothoracic Surgery

## 2018-01-25 DIAGNOSIS — R262 Difficulty in walking, not elsewhere classified: Secondary | ICD-10-CM | POA: Diagnosis present

## 2018-01-25 DIAGNOSIS — Z952 Presence of prosthetic heart valve: Secondary | ICD-10-CM

## 2018-01-25 DIAGNOSIS — R351 Nocturia: Secondary | ICD-10-CM | POA: Diagnosis present

## 2018-01-25 DIAGNOSIS — Z6832 Body mass index (BMI) 32.0-32.9, adult: Secondary | ICD-10-CM

## 2018-01-25 DIAGNOSIS — Z79899 Other long term (current) drug therapy: Secondary | ICD-10-CM

## 2018-01-25 DIAGNOSIS — E669 Obesity, unspecified: Secondary | ICD-10-CM | POA: Diagnosis present

## 2018-01-25 DIAGNOSIS — I7102 Dissection of abdominal aorta: Secondary | ICD-10-CM

## 2018-01-25 DIAGNOSIS — R350 Frequency of micturition: Secondary | ICD-10-CM | POA: Diagnosis present

## 2018-01-25 DIAGNOSIS — J9811 Atelectasis: Secondary | ICD-10-CM | POA: Diagnosis present

## 2018-01-25 DIAGNOSIS — I7101 Dissection of thoracic aorta: Secondary | ICD-10-CM | POA: Diagnosis present

## 2018-01-25 DIAGNOSIS — F251 Schizoaffective disorder, depressive type: Secondary | ICD-10-CM | POA: Diagnosis present

## 2018-01-25 DIAGNOSIS — N183 Chronic kidney disease, stage 3 (moderate): Secondary | ICD-10-CM | POA: Diagnosis present

## 2018-01-25 DIAGNOSIS — I7103 Dissection of thoracoabdominal aorta: Secondary | ICD-10-CM

## 2018-01-25 DIAGNOSIS — I5032 Chronic diastolic (congestive) heart failure: Secondary | ICD-10-CM | POA: Diagnosis present

## 2018-01-25 DIAGNOSIS — I130 Hypertensive heart and chronic kidney disease with heart failure and stage 1 through stage 4 chronic kidney disease, or unspecified chronic kidney disease: Secondary | ICD-10-CM | POA: Diagnosis present

## 2018-01-25 DIAGNOSIS — E876 Hypokalemia: Secondary | ICD-10-CM | POA: Diagnosis present

## 2018-01-25 DIAGNOSIS — D6859 Other primary thrombophilia: Secondary | ICD-10-CM | POA: Diagnosis present

## 2018-01-25 DIAGNOSIS — Z993 Dependence on wheelchair: Secondary | ICD-10-CM

## 2018-01-25 DIAGNOSIS — E559 Vitamin D deficiency, unspecified: Secondary | ICD-10-CM | POA: Diagnosis present

## 2018-01-25 DIAGNOSIS — Z8249 Family history of ischemic heart disease and other diseases of the circulatory system: Secondary | ICD-10-CM

## 2018-01-25 DIAGNOSIS — G20 Parkinson's disease: Secondary | ICD-10-CM | POA: Diagnosis present

## 2018-01-25 DIAGNOSIS — K8020 Calculus of gallbladder without cholecystitis without obstruction: Secondary | ICD-10-CM | POA: Diagnosis present

## 2018-01-25 DIAGNOSIS — I482 Chronic atrial fibrillation: Secondary | ICD-10-CM | POA: Diagnosis present

## 2018-01-25 DIAGNOSIS — E1151 Type 2 diabetes mellitus with diabetic peripheral angiopathy without gangrene: Secondary | ICD-10-CM | POA: Diagnosis present

## 2018-01-25 DIAGNOSIS — Z7401 Bed confinement status: Secondary | ICD-10-CM

## 2018-01-25 DIAGNOSIS — R5383 Other fatigue: Secondary | ICD-10-CM | POA: Diagnosis present

## 2018-01-25 DIAGNOSIS — E1122 Type 2 diabetes mellitus with diabetic chronic kidney disease: Secondary | ICD-10-CM | POA: Diagnosis present

## 2018-01-25 DIAGNOSIS — M810 Age-related osteoporosis without current pathological fracture: Secondary | ICD-10-CM | POA: Diagnosis present

## 2018-01-25 DIAGNOSIS — I2510 Atherosclerotic heart disease of native coronary artery without angina pectoris: Secondary | ICD-10-CM | POA: Diagnosis present

## 2018-01-25 DIAGNOSIS — F0280 Dementia in other diseases classified elsewhere without behavioral disturbance: Secondary | ICD-10-CM | POA: Diagnosis present

## 2018-01-25 DIAGNOSIS — E785 Hyperlipidemia, unspecified: Secondary | ICD-10-CM | POA: Diagnosis present

## 2018-01-25 DIAGNOSIS — I71019 Dissection of thoracic aorta, unspecified: Secondary | ICD-10-CM

## 2018-01-25 DIAGNOSIS — R079 Chest pain, unspecified: Secondary | ICD-10-CM

## 2018-01-25 DIAGNOSIS — I361 Nonrheumatic tricuspid (valve) insufficiency: Secondary | ICD-10-CM | POA: Diagnosis not present

## 2018-01-25 DIAGNOSIS — Z7982 Long term (current) use of aspirin: Secondary | ICD-10-CM

## 2018-01-25 DIAGNOSIS — I472 Ventricular tachycardia: Secondary | ICD-10-CM | POA: Diagnosis present

## 2018-01-25 DIAGNOSIS — S27892A Contusion of other specified intrathoracic organs, initial encounter: Secondary | ICD-10-CM

## 2018-01-25 DIAGNOSIS — Z8379 Family history of other diseases of the digestive system: Secondary | ICD-10-CM

## 2018-01-25 HISTORY — DX: Tinea cruris: B35.6

## 2018-01-25 HISTORY — DX: Hyperlipidemia, unspecified: E78.5

## 2018-01-25 HISTORY — DX: Anemia, unspecified: D64.9

## 2018-01-25 HISTORY — DX: Parkinson's disease without dyskinesia, without mention of fluctuations: G20.A1

## 2018-01-25 HISTORY — DX: Parkinson's disease: G20

## 2018-01-25 HISTORY — DX: Dysphagia, unspecified: R13.10

## 2018-01-25 HISTORY — DX: Feeding difficulties: R63.3

## 2018-01-25 HISTORY — DX: Cognitive communication deficit: R41.841

## 2018-01-25 HISTORY — DX: Vascular dementia, unspecified severity, without behavioral disturbance, psychotic disturbance, mood disturbance, and anxiety: F01.50

## 2018-01-25 HISTORY — DX: Vitamin D deficiency, unspecified: E55.9

## 2018-01-25 HISTORY — DX: Other primary thrombophilia: D68.59

## 2018-01-25 HISTORY — DX: Disorder of kidney and ureter, unspecified: N28.9

## 2018-01-25 HISTORY — DX: Hypokalemia: E87.6

## 2018-01-25 HISTORY — DX: Peripheral vascular disease, unspecified: I73.9

## 2018-01-25 HISTORY — DX: Age-related osteoporosis without current pathological fracture: M81.0

## 2018-01-25 HISTORY — DX: Feeding difficulties, unspecified: R63.30

## 2018-01-25 LAB — CBC
HEMATOCRIT: 46.8 % (ref 39.0–52.0)
Hemoglobin: 15.3 g/dL (ref 13.0–17.0)
MCH: 28 pg (ref 26.0–34.0)
MCHC: 32.7 g/dL (ref 30.0–36.0)
MCV: 85.7 fL (ref 78.0–100.0)
Platelets: 164 10*3/uL (ref 150–400)
RBC: 5.46 MIL/uL (ref 4.22–5.81)
RDW: 14.6 % (ref 11.5–15.5)
WBC: 14.6 10*3/uL — ABNORMAL HIGH (ref 4.0–10.5)

## 2018-01-25 LAB — I-STAT CHEM 8, ED
BUN: 20 mg/dL (ref 6–20)
CHLORIDE: 97 mmol/L — AB (ref 101–111)
CREATININE: 0.9 mg/dL (ref 0.61–1.24)
Calcium, Ion: 1.08 mmol/L — ABNORMAL LOW (ref 1.15–1.40)
GLUCOSE: 106 mg/dL — AB (ref 65–99)
HCT: 47 % (ref 39.0–52.0)
Hemoglobin: 16 g/dL (ref 13.0–17.0)
Potassium: 3.8 mmol/L (ref 3.5–5.1)
SODIUM: 136 mmol/L (ref 135–145)
TCO2: 29 mmol/L (ref 22–32)

## 2018-01-25 LAB — BASIC METABOLIC PANEL
Anion gap: 12 (ref 5–15)
BUN: 20 mg/dL (ref 6–20)
CHLORIDE: 99 mmol/L — AB (ref 101–111)
CO2: 27 mmol/L (ref 22–32)
CREATININE: 0.87 mg/dL (ref 0.61–1.24)
Calcium: 8.9 mg/dL (ref 8.9–10.3)
GFR calc Af Amer: >60 mL/min (ref >60)
GFR calc non Af Amer: >60 mL/min (ref >60)
GLUCOSE: 110 mg/dL — AB (ref 65–99)
POTASSIUM: 3.9 mmol/L (ref 3.5–5.1)
Sodium: 138 mmol/L (ref 135–145)

## 2018-01-25 LAB — HEPATIC FUNCTION PANEL
ALK PHOS: 49 U/L (ref 38–126)
ALT: 6 U/L — AB (ref 17–63)
AST: 13 U/L — AB (ref 15–41)
Albumin: 2.7 g/dL — ABNORMAL LOW (ref 3.5–5.0)
Bilirubin, Direct: 0.2 mg/dL (ref 0.1–0.5)
Indirect Bilirubin: 0.6 mg/dL (ref 0.3–0.9)
Total Bilirubin: 0.8 mg/dL (ref 0.3–1.2)
Total Protein: 5.4 g/dL — ABNORMAL LOW (ref 6.5–8.1)

## 2018-01-25 LAB — I-STAT TROPONIN, ED: Troponin i, poc: 0.01 ng/mL (ref 0.00–0.08)

## 2018-01-25 LAB — PROTIME-INR
INR: 1.17
Prothrombin Time: 14.9 seconds (ref 11.4–15.2)

## 2018-01-25 LAB — TROPONIN I: Troponin I: <0.03 ng/mL (ref <0.03)

## 2018-01-25 LAB — HEMOGLOBIN A1C
Hgb A1c MFr Bld: 5 % (ref 4.8–5.6)
Mean Plasma Glucose: 96.8 mg/dL

## 2018-01-25 LAB — I-STAT CG4 LACTIC ACID, ED: Lactic Acid, Venous: 1.71 mmol/L (ref 0.5–1.9)

## 2018-01-25 MED ORDER — CLONIDINE HCL 0.2 MG/24HR TD PTWK
0.2000 mg | MEDICATED_PATCH | TRANSDERMAL | Status: DC
  Administered 2018-01-26: 0.2 mg via TRANSDERMAL
  Filled 2018-01-25: qty 1

## 2018-01-25 MED ORDER — ACETAMINOPHEN 325 MG PO TABS
650.0000 mg | ORAL_TABLET | Freq: Four times a day (QID) | ORAL | Status: DC | PRN
  Administered 2018-01-27: 650 mg via ORAL
  Filled 2018-01-25: qty 2

## 2018-01-25 MED ORDER — ONDANSETRON HCL 4 MG/2ML IJ SOLN
4.0000 mg | Freq: Once | INTRAMUSCULAR | Status: DC
Start: 2018-01-25 — End: 2018-01-31

## 2018-01-25 MED ORDER — TRAMADOL HCL 50 MG PO TABS
50.0000 mg | ORAL_TABLET | Freq: Four times a day (QID) | ORAL | Status: DC | PRN
  Filled 2018-01-25: qty 1

## 2018-01-25 MED ORDER — IOPAMIDOL (ISOVUE-370) INJECTION 76%
100.0000 mL | Freq: Once | INTRAVENOUS | Status: AC | PRN
  Administered 2018-01-25: 100 mL via INTRAVENOUS

## 2018-01-25 MED ORDER — CARBIDOPA-LEVODOPA ER 50-200 MG PO TBCR
1.0000 | EXTENDED_RELEASE_TABLET | Freq: Two times a day (BID) | ORAL | Status: DC
  Administered 2018-01-26 – 2018-01-31 (×11): 1 via ORAL
  Filled 2018-01-25 (×12): qty 1

## 2018-01-25 MED ORDER — NICARDIPINE HCL IN NACL 20-0.86 MG/200ML-% IV SOLN
3.0000 mg/h | INTRAVENOUS | Status: DC
  Administered 2018-01-25: 5 mg/h via INTRAVENOUS
  Administered 2018-01-26: 8 mg/h via INTRAVENOUS
  Administered 2018-01-26: 3 mg/h via INTRAVENOUS
  Administered 2018-01-26: 8 mg/h via INTRAVENOUS
  Administered 2018-01-26: 4 mg/h via INTRAVENOUS
  Administered 2018-01-27: 5 mg/h via INTRAVENOUS
  Filled 2018-01-25 (×8): qty 200

## 2018-01-25 MED ORDER — ATORVASTATIN CALCIUM 40 MG PO TABS
40.0000 mg | ORAL_TABLET | Freq: Every day | ORAL | Status: DC
  Administered 2018-01-26 – 2018-01-30 (×5): 40 mg via ORAL
  Filled 2018-01-25 (×5): qty 1

## 2018-01-25 MED ORDER — LABETALOL HCL 5 MG/ML IV SOLN
0.5000 mg/min | INTRAVENOUS | Status: DC
  Administered 2018-01-25: 1 mg/min via INTRAVENOUS
  Filled 2018-01-25 (×3): qty 100

## 2018-01-25 MED ORDER — NITROPRUSSIDE SODIUM 25 MG/ML IV SOLN
0.0000 ug/kg/min | INTRAVENOUS | Status: DC
  Administered 2018-01-25: 0.3 ug/kg/min via INTRAVENOUS
  Filled 2018-01-25: qty 2

## 2018-01-25 MED ORDER — FENTANYL CITRATE (PF) 100 MCG/2ML IJ SOLN
INTRAMUSCULAR | Status: AC
  Filled 2018-01-25: qty 2

## 2018-01-25 MED ORDER — LABETALOL HCL 5 MG/ML IV SOLN
10.0000 mg | INTRAVENOUS | Status: DC | PRN
  Administered 2018-01-25 (×2): 10 mg via INTRAVENOUS
  Filled 2018-01-25 (×2): qty 4

## 2018-01-25 MED ORDER — SODIUM CHLORIDE 0.9 % IV SOLN: INTRAVENOUS | Status: DC | PRN

## 2018-01-25 MED ORDER — VITAMIN B-1 100 MG PO TABS
100.0000 mg | ORAL_TABLET | Freq: Every day | ORAL | Status: DC
  Administered 2018-01-26 – 2018-01-31 (×6): 100 mg via ORAL
  Filled 2018-01-25 (×6): qty 1

## 2018-01-25 MED ORDER — AMIODARONE LOAD VIA INFUSION
150.0000 mg | Freq: Once | INTRAVENOUS | Status: DC
  Filled 2018-01-25: qty 83.34

## 2018-01-25 MED ORDER — LABETALOL HCL 5 MG/ML IV SOLN
20.0000 mg | Freq: Once | INTRAVENOUS | Status: AC
Start: 2018-01-25 — End: 2018-01-25
  Administered 2018-01-25: 20 mg via INTRAVENOUS

## 2018-01-25 MED ORDER — DIVALPROEX SODIUM 250 MG PO DR TAB
250.0000 mg | DELAYED_RELEASE_TABLET | Freq: Two times a day (BID) | ORAL | Status: DC
  Administered 2018-01-25 – 2018-01-27 (×4): 250 mg via ORAL
  Filled 2018-01-25 (×4): qty 1

## 2018-01-25 MED ORDER — AMIODARONE IV BOLUS ONLY 150 MG/100ML
150.0000 mg | Freq: Once | INTRAVENOUS | Status: AC
  Administered 2018-01-25: 150 mg via INTRAVENOUS
  Filled 2018-01-25: qty 100

## 2018-01-25 MED ORDER — AMIODARONE HCL 200 MG PO TABS
200.0000 mg | ORAL_TABLET | Freq: Every day | ORAL | Status: DC
  Administered 2018-01-26 – 2018-01-31 (×6): 200 mg via ORAL
  Filled 2018-01-25 (×6): qty 1

## 2018-01-25 MED ORDER — ESMOLOL HCL-SODIUM CHLORIDE 2000 MG/100ML IV SOLN: 25.0000 ug/kg/min | Freq: Once | INTRAVENOUS | Status: DC

## 2018-01-25 MED ORDER — LABETALOL HCL 5 MG/ML IV SOLN
20.0000 mg | Freq: Once | INTRAVENOUS | Status: AC
  Administered 2018-01-25: 20 mg via INTRAVENOUS

## 2018-01-25 MED ORDER — LABETALOL HCL 5 MG/ML IV SOLN: 40.0000 mg | Freq: Once | INTRAVENOUS | Status: DC

## 2018-01-25 MED ORDER — ACETAMINOPHEN 650 MG RE SUPP: 650.0000 mg | Freq: Four times a day (QID) | RECTAL | Status: DC | PRN

## 2018-01-25 MED ORDER — DOCUSATE SODIUM 100 MG PO CAPS
100.0000 mg | ORAL_CAPSULE | Freq: Two times a day (BID) | ORAL | Status: DC
  Administered 2018-01-25 – 2018-01-31 (×12): 100 mg via ORAL
  Filled 2018-01-25 (×12): qty 1

## 2018-01-25 MED ORDER — IPRATROPIUM-ALBUTEROL 0.5-2.5 (3) MG/3ML IN SOLN
3.0000 mL | RESPIRATORY_TRACT | Status: DC
  Administered 2018-01-25 – 2018-01-26 (×4): 3 mL via RESPIRATORY_TRACT
  Filled 2018-01-25 (×4): qty 3

## 2018-01-25 MED ORDER — FENTANYL CITRATE (PF) 100 MCG/2ML IJ SOLN
50.0000 ug | Freq: Once | INTRAMUSCULAR | Status: AC
  Administered 2018-01-25: 50 ug via INTRAVENOUS

## 2018-01-25 MED ORDER — FLUOXETINE HCL 20 MG PO CAPS
20.0000 mg | ORAL_CAPSULE | Freq: Every day | ORAL | Status: DC
  Administered 2018-01-26 – 2018-01-31 (×6): 20 mg via ORAL
  Filled 2018-01-25 (×6): qty 1

## 2018-01-25 MED ORDER — OLANZAPINE 7.5 MG PO TABS
15.0000 mg | ORAL_TABLET | Freq: Every day | ORAL | Status: DC
  Administered 2018-01-25 – 2018-01-30 (×6): 15 mg via ORAL
  Filled 2018-01-25 (×6): qty 2

## 2018-01-25 MED ORDER — METOPROLOL TARTRATE 50 MG PO TABS
50.0000 mg | ORAL_TABLET | Freq: Two times a day (BID) | ORAL | Status: DC
  Administered 2018-01-25 – 2018-01-31 (×12): 50 mg via ORAL
  Filled 2018-01-25 (×12): qty 1

## 2018-01-25 MED ORDER — SODIUM CHLORIDE 0.9 % IV SOLN: INTRAVENOUS | Status: DC

## 2018-01-25 MED ORDER — INSULIN ASPART 100 UNIT/ML ~~LOC~~ SOLN
0.0000 [IU] | Freq: Three times a day (TID) | SUBCUTANEOUS | Status: DC
  Administered 2018-01-26: 2 [IU] via SUBCUTANEOUS

## 2018-01-25 MED ORDER — LISINOPRIL 5 MG PO TABS
5.0000 mg | ORAL_TABLET | Freq: Every day | ORAL | Status: DC
  Administered 2018-01-26 – 2018-01-27 (×2): 5 mg via ORAL
  Filled 2018-01-25 (×2): qty 1

## 2018-01-25 MED ORDER — LABETALOL HCL 5 MG/ML IV SOLN: 10.0000 mg | Freq: Once | INTRAVENOUS | Status: DC

## 2018-01-25 MED ORDER — SORBITOL 70 % SOLN
30.0000 mL | Freq: Every day | Status: DC | PRN
Start: 2018-01-25 — End: 2018-01-31
  Administered 2018-01-27 – 2018-01-31 (×3): 30 mL via ORAL
  Filled 2018-01-25 (×4): qty 30

## 2018-01-25 MED ORDER — FENTANYL CITRATE (PF) 100 MCG/2ML IJ SOLN
50.0000 ug | INTRAMUSCULAR | Status: DC | PRN
Start: 2018-01-25 — End: 2018-01-27

## 2018-01-25 MED ORDER — FENTANYL CITRATE (PF) 100 MCG/2ML IJ SOLN
50.0000 ug | Freq: Once | INTRAMUSCULAR | Status: DC
Start: 2018-01-25 — End: 2018-01-25

## 2018-01-25 NOTE — ED Notes (Signed)
Pt resting with eyes shut.  Arouses when name called .   Pt states his pain is better at this time.  Pt pale and clammy to touch

## 2018-01-25 NOTE — ED Provider Notes (Signed)
Patient arrived from [facility] with acute, type B dissection. On nipride 0.3 on arrival. HR 120s, EKG showing AfIb RVR. No blood thinners per my review of records. BP 220/180s. Suspect ongoing severe HTN, with increase in HR likely 2/2 his nipride drip. Pt given repeated boluses of labetalol by myself and bolus drip started. HR improving to <100, BP improving as well. Will shoot for goal HR 60-70s, BP 100.  Nipride stopped as I suspect some of his tachycardia is reflex 2/2 vasodilation without beta blockade.  Labetalol given and is now on maximum dose.  Heart rate remains in the 110s, but blood pressure is improving to the 140s.  I discussed this with pharmacy.  Rather than switched to esmolol, they recommend starting Cardene.  Will start this at a low dose for blood pressure control as it will have a neutral effect or slightly improve his heart rate.  I suspect that if his heart rate remains elevated despite maximum labetalol and Cardene, will start amiodarone as I think a large component of this is also simply uncontrolled A. Fib. Of note, he was also given multiple doses of lopressor by EMS without improvement, so I suspect rate control via beta blockade has been optimized at this point.  Arterial line placed by respiratory.  Patient improving on exam.  He states he feels improved.  Heart rate improving after amiodarone, labetalol, and Cardene.  Blood pressure now in the 500 systolic.  Heart rate down to the 80s.  Will continue current care.  Cardiothoracic surgery to see. Dr. Prescott Gum.   CRITICAL CARE Performed by: Evonnie Pat   Total critical care time: 55 minutes  Critical care time was exclusive of separately billable procedures and treating other patients.  Critical care was necessary to treat or prevent imminent or life-threatening deterioration.  Critical care was time spent personally by me on the following activities: development of treatment plan with patient and/or surrogate as well as  nursing, discussions with consultants, evaluation of patient's response to treatment, examination of patient, obtaining history from patient or surrogate, ordering and performing treatments and interventions, ordering and review of laboratory studies, ordering and review of radiographic studies, pulse oximetry and re-evaluation of patient's condition.    Duffy Bruce, MD 01/25/18 1945

## 2018-01-25 NOTE — Consult Note (Signed)
[address]       [place] [address]             [phone]        De[facility]lyn T [facility] Medical Record #[id number] Date of Birth: 23-Jan-1951  Referring: Daleen Bo MD  primary Care: Hilbert Corrigan, MD Primary Cardiologist:No primary care provider on file.  Chief Complaint: Chest pain Chief Complaint  Patient presents with  . 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  The patient has had chest pain for the past 2-3 days.  Alsoinvolving the upper thorax.  No syncope no neurologic symptoms, no abdominal pain.  Diagnosis was made by CTA performed at outside hospital.  Images were personally reviewed.  The ascending aorta is proximate 4 cm in diameter.  The dissection starts distal to the left subclavian artery and there is thrombus in the false lumen with a total diameter of 5 cm.  The false lumen extends to the abdominal aorta just above the renal arteries and then resolves.  There is no pleural effusion.   Current Activity/ Functional Status: Poor functional status.  Patient lives in a chronic care facility, is nonambulatory and stays in a wheelchair   Zubrod Score: At the time of surgery this patient's most appropriate activity status/level should be described as: []     0    Normal activity, no symptoms []     1    Restricted in physical strenuous activity but ambulatory, able to do out light work []     2    Ambulatory and capable of self care, unable to do work activities, up and about                 more than 50%  Of the time                            []     3    Only limited self care, in bed greater than 50% of waking hours [x]     4    Completely disabled, no self care, confined to bed or chair []     5    Moribund  Past Medical  History:  Diagnosis Date  . A-fib ([place])   . Anemia   . Atrial fibrillation ([place]) 06/2012   onset 06/2012; normal TSH; EF-45%  . CHF (congestive heart failure) ([place])   . Cholelithiasis    incidental finding on CT in 2013  . Cognitive communication deficit   . Dysphagia   . Feeding difficulties   . Gout   . Hyperlipidemia   . Hypertension    minimal coronary atherosclerosis by CT  . Hypokalemia   . Liver disease   . Noncompliance 09/21/2012  . Osteoporosis   . Overweight(278.02)   . Parkinson's disease ([place])   . Peripheral vascular disease ([place])   . Renal disorder   . Schizoaffective disorder ([place])    With depression  . Systolic dysfunction   . Thrombophilia ([place])   . Tinea cruris   . Vascular dementia   . Vitamin D deficiency     Past Surgical History:  Procedure Laterality Date  . COLONOSCOPY  2006   normal screening examination  . prosthetic heart valve  Social History   Tobacco Use  Smoking Status Never Smoker  Smokeless Tobacco Never Used   Social History   Substance and Sexual Activity  Alcohol Use No   Comment: former     Social History   Socioeconomic History  . Marital status: Single    Spouse name: Not on file  . Number of children: Not on file  . Years of education: Not on file  . Highest education level: Not on file  Occupational History  . Not on file  Social Needs  . Financial resource strain: Not on file  . Food insecurity:    Worry: Not on file    Inability: Not on file  . Transportation needs:    Medical: Not on file    Non-medical: Not on file  Tobacco Use  . Smoking status: Never Smoker  . Smokeless tobacco: Never Used  Substance and Sexual Activity  . Alcohol use: No    Comment: former   . Drug use: No  . Sexual activity: Not on file  Lifestyle  . Physical activity:    Days per week: Not on file    Minutes per session: Not on file  . Stress: Not on file  Relationships  . Social connections:    Talks on phone: Not  on file    Gets together: Not on file    Attends religious service: Not on file    Active member of club or organization: Not on file    Attends meetings of clubs or organizations: Not on file    Relationship status: Not on file  . Intimate partner violence:    Fear of current or ex partner: Not on file    Emotionally abused: Not on file    Physically abused: Not on file    Forced sexual activity: Not on file  Other Topics Concern  . Not on file  Social History Narrative  . Not on file    No Known Allergies  Current Facility-Administered Medications  Medication Dose Route Frequency Provider Last Rate Last Dose  . 0.9 %  sodium chloride infusion   Intra-arterial PRN Duffy Bruce, MD      . acetaminophen (TYLENOL) tablet 650 mg  650 mg Oral Q6H PRN Prescott Gum, Collier Salina, MD       Or  . acetaminophen (TYLENOL) suppository 650 mg  650 mg Rectal Q6H PRN Ivin Poot, MD      . Derrill Memo ON 01/26/2018] atorvastatin (LIPITOR) tablet 40 mg  40 mg Oral q1800 Prescott Gum, Collier Salina, MD      . carbidopa-levodopa (SINEMET CR) 50-200 MG per tablet controlled release 1 tablet  1 tablet Oral BID Prescott Gum, Collier Salina, MD      . cloNIDine (CATAPRES - Dosed in mg/24 hr) patch 0.2 mg  0.2 mg Transdermal Weekly Prescott Gum, Collier Salina, MD      . divalproex (DEPAKOTE) DR tablet 250 mg  250 mg Oral BID Prescott Gum, Collier Salina, MD      . docusate sodium (COLACE) capsule 100 mg  100 mg Oral BID Prescott Gum, Collier Salina, MD      . FLUoxetine (PROZAC) capsule 20 mg  20 mg Oral Daily Prescott Gum, Collier Salina, MD      . ipratropium-albuterol (DUONEB) 0.5-2.5 (3) MG/3ML nebulizer solution 3 mL  3 mL Nebulization Q4H Prescott Gum, Collier Salina, MD      . labetalol (NORMODYNE,TRANDATE) 500 mg in dextrose 5 % 125 mL (4 mg/mL) infusion  0.5-3 mg/min Intravenous Titrated Duffy Bruce,  MD 30 mL/hr at 01/25/18 2037 2 mg/min at 01/25/18 2037  . [START ON 01/26/2018] lisinopril (PRINIVIL,ZESTRIL) tablet 5 mg  5 mg Oral Daily Prescott Gum, Collier Salina, MD      . metoprolol  tartrate (LOPRESSOR) tablet 50 mg  50 mg Oral BID Prescott Gum, Collier Salina, MD      . nicardipine (CARDENE) 20mg  in 0.86% saline 227ml IV infusion (0.1 mg/ml)  3-15 mg/hr Intravenous Continuous Duffy Bruce, MD 30 mL/hr at 01/25/18 2035 3 mg/hr at 01/25/18 2035  . OLANZapine (ZYPREXA) tablet 15 mg  15 mg Oral QHS Prescott Gum, Collier Salina, MD      . ondansetron [facility]) injection 4 mg  4 mg Intravenous Once Prescott Gum, Collier Salina, MD      . sorbitol 70 % solution 30 mL  30 mL Oral Daily PRN Prescott Gum, Collier Salina, MD      . thiamine (VITAMIN B-1) tablet 100 mg  100 mg Oral Daily Prescott Gum, Collier Salina, MD      . traMADol [facility]) tablet 50 mg  50 mg Oral Q6H PRN Ivin Poot, MD       Current Outpatient Medications  Medication Sig Dispense Refill  . acetaminophen (TYLENOL) 650 MG CR tablet Take 650 mg by mouth every 8 (eight) hours as needed for pain.    Marland Kitchen alum & mag hydroxide-simeth (MAALOX/MYLANTA) 200-200-20 MG/5ML suspension Take 30 mLs by mouth every 2 (two) hours as needed for indigestion or heartburn.    Marland Kitchen aspirin EC 81 MG tablet Take 81 mg by mouth daily.    Marland Kitchen atorvastatin (LIPITOR) 40 MG tablet Take 40 mg by mouth daily at 6 PM.    . bisacodyl (DULCOLAX) 5 MG EC tablet Take 10 mg by mouth daily as needed for moderate constipation.    . calcium carbonate (TUMS - DOSED IN MG ELEMENTAL CALCIUM) 500 MG chewable tablet Chew 1 tablet by mouth 3 (three) times daily.    . carbidopa-levodopa (SINEMET CR) 50-200 MG tablet Take 1 tablet by mouth 2 (two) times daily.    . cloNIDine (CATAPRES) 0.1 MG tablet Take 0.1 mg by mouth every 4 (four) hours as needed (for hypertension related systolic BP >580 mmHg).    . clotrimazole (LOTRIMIN) 1 % cream Apply 1 application topically 2 (two) times daily.    . divalproex (DEPAKOTE) 125 MG DR tablet Take 375 mg by mouth 2 (two) times daily.     . ergocalciferol (VITAMIN D2) 50000 units capsule Take 50,000 Units by mouth every 30 (thirty) days. Starting on the 1st and ending on the first  every month    . fluconazole (DIFLUCAN) 200 MG tablet Take 200 mg by mouth See admin instructions. Take 1 tablet ( 200 mg totally) by mouth in the evening every Mon for fungal infection for 4 weeks    . FLUoxetine (PROZAC) 20 MG capsule Take 20 mg by mouth every morning.     Marland Kitchen ipratropium-albuterol (DUONEB) 0.5-2.5 (3) MG/3ML SOLN Take 3 mLs by nebulization every 6 (six) hours as needed (for SOB; wheezing).    Marland Kitchen lisinopril (PRINIVIL,ZESTRIL) 5 MG tablet Take 5 mg by mouth daily.    . magnesium hydroxide (MILK OF MAGNESIA) 400 MG/5ML suspension Take 30 mLs by mouth daily as needed for mild constipation.    . metoprolol (LOPRESSOR) 50 MG tablet Take 50 mg by mouth 2 (two) times daily.    Marland Kitchen OLANZapine (ZYPREXA) 15 MG tablet Take 15 mg by mouth at bedtime.    . ondansetron (ZOFRAN) 4  MG tablet Take 4 mg by mouth every 4 (four) hours as needed for nausea or vomiting.    . predniSONE (DELTASONE) 20 MG tablet Take 40 mg by mouth at bedtime as needed (for SOB in 4 days).    Marland Kitchen levofloxacin (LEVAQUIN) 750 MG tablet Take 750 mg by mouth See admin instructions. Take 1 tablet (750 mg totally) by mouth at bedtime for infection in 3 days       (Not in a hospital admission)  Family History  Problem Relation Age of Onset  . Hypertension Mother   . Liver disease Father      Review of Systems:   ROS      Cardiac Review of Systems: Y or  [    ]= no  Chest Pain [place]   ]  Resting SOB [   ] Exertional SOB  [  ]  Orthopnea [  ]   Pedal Edema [yes]    Palpitations [yes] Syncope  [  ]   Presyncope [   ]  General Review of Systems: [Y] = yes [  ]=no Constitional: recent weight change [  ]; anorexia [  ]; fatigue [  ]; nausea [  ]; night sweats [  ]; fever [  ]; or chills [  ]                                                               Dental: poor dentition[  ]; Last Dentist visit: One year  Eye : blurred vision [  ]; diplopia [   ]; vision changes [  ];  Amaurosis fugax[  ]; Resp: cough [  ];  wheezing[   ];  hemoptysis[  ]; shortness of breath[  ]; paroxysmal nocturnal dyspnea[  ]; dyspnea on exertion[  ]; or orthopnea[  ];  GI:  gallstones[yes], vomiting[  ];  dysphagia[  ]; melena[  ];  hematochezia [  ]; heartburn[  ];   Hx of  Colonoscopy[  ]; GU: kidney stones [  ]; hematuria[  ];   dysuria [  ];  nocturia[yes];  history of     obstruction [  ]; urinary frequency [yes]             Skin: rash, swelling[  ];, hair loss[  ];  peripheral edema[  ];  or itching[  ]; Musculosketetal: myalgias[  ];  joint swelling[  ];  joint erythema[  ];  joint pain[  ];  back pain[  ];  Heme/Lymph: bruising[  ];  bleeding[  ];  anemia[  ];  Neuro: TIA[  ];  headaches[  ];  stroke[  ];  vertigo[  ];  seizures[  ];   paresthesias[  ];  difficulty walking[yes];  Psych:depression[  ]; anxiety[  ];  Endocrine: diabetes[yes];  thyroid dysfunction[  ];  Immunizations: Flu [  ]; Pneumococcal[  ];    Physical Exam: BP (!) 172/83 (BP Location: Right Wrist) Comment: Art line  Pulse (!) 58   Temp (!) 97.4 F (36.3 C) (Oral)   Resp 20   Ht 5\' 11"  (1.803 m)   Wt 230 lb (104.3 kg)   SpO2 99%   BMI 32.08 kg/m         Exam    General-obese  chronically ill patient who is responsive and appears much older than stated age    Neck- no JVD, no cervical adenopathy palpable, no carotid bruit   Lungs- clear without rales, wheezes, diminished breath sounds at the bases   Cor- irregular rate and rhythm, no murmur , gallop   Abdomen- soft, non-tender, obese   Extremities - warm, non-tender, minimal edema   Neuro- oriented, appropriate, no focal weakness    Vascular-pulses present both upper extremities, both femoral arteries, both carotids,  Diagnostic Studies & Laboratory data:     Recent Radiology Findings:   Dg Chest Portable 1 View  Result Date: 01/25/2018 CLINICAL DATA:  Acute chest pain today. EXAM: PORTABLE CHEST 1 VIEW COMPARISON:  11/25/2014 and prior radiographs. FINDINGS: The LEFT mediastinum in the region  of the aortic arch appears more prominent when compared to prior studies could be technical as this is portable, slightly lordotic and the patient is rotated. Cardiomegaly again identified. This is a low volume film. No airspace disease, pleural effusion or pneumothorax noted. IMPRESSION: Slightly more prominent LEFT SUPERIOR mediastinum in the region the aortic arch-question technical as this is a portable, slightly lordotic and rotated study. If there is strong clinical concern for thoracic aortic abnormality recommend CTA chest. Otherwise consider short-term repeat chest radiograph. Cardiomegaly. Electronically Signed   By: Margarette Canada M.D.   On: 01/25/2018 14:00   Ct Angio Chest/abd/pel For Dissection W And/or W/wo  Result Date: 01/25/2018 CLINICAL DATA:  Chest and back pain.  Evaluate for dissection. EXAM: CT ANGIOGRAPHY CHEST, ABDOMEN AND PELVIS TECHNIQUE: Multidetector CT imaging through the chest, abdomen and pelvis was performed using the standard protocol during bolus administration of intravenous contrast. Multiplanar reconstructed images and MIPs were obtained and reviewed to evaluate the vascular anatomy. CONTRAST:  153mL ISOVUE-370 IOPAMIDOL (ISOVUE-370) INJECTION 76% COMPARISON:  CT a chest 09/20/2012 FINDINGS: CTA CHEST FINDINGS Cardiovascular: Moderate cardiac enlargement with a small to moderate dependent pericardial effusion. 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 There is evidence of periaortic and mediastinal hematoma compatible with contained rupture. The dissection flap does not involve the branches of the aortic arch or branches of the abdominal aorta. The main pulmonary artery appears patent. No central obstructing embolus identified. Mediastinum/Nodes: Normal appearance of the thyroid gland. Decreased AP diameter of the trachea is similar to previous exam and may reflect chronic tracheobronchomalacia. Normal appearance of the  esophagus. No enlarged mediastinal or hilar lymph nodes. Lungs/Pleura: Small bilateral pleural effusions. Mild diffuse interstitial prominence identified bilaterally. Subsegmental atelectasis within the dependent lung zones identified. No airspace consolidation. Musculoskeletal: Multi level thoracic spondylosis. Review of the MIP images confirms the above findings. CTA ABDOMEN AND PELVIS FINDINGS VASCULAR Aorta: No evidence for dissection beyond the supra celiac abdominal aorta. No aneurysm. Celiac: Patent without evidence of aneurysm, dissection, vasculitis or significant stenosis. SMA: Patent without evidence of aneurysm, dissection, vasculitis or significant stenosis. Renals: Both renal arteries are patent without evidence of aneurysm, dissection, vasculitis, fibromuscular dysplasia or significant stenosis. IMA: Patent without evidence of aneurysm, dissection, vasculitis or significant stenosis. Inflow: Patent without evidence of aneurysm, dissection, vasculitis or significant stenosis. Veins: There is mixed attenuation within the portal venous confluence and tributary vessels to the SMV. This is nonspecific given the arterial phase of opacification in may represent mixing opacified and unopacified blood. Review of the MIP images confirms the above findings. NON-VASCULAR Hepatobiliary: No focal liver abnormality. Stones are noted within the gallbladder measuring up to  8 mm. Pancreas: Unremarkable. No pancreatic ductal dilatation or surrounding inflammatory changes. Spleen: Normal in size without focal abnormality. Adrenals/Urinary Tract: The adrenal glands are normal. Right kidney cyst measures 1.6 cm. Right renal calculus is identified measuring 8 mm. Urinary bladder is unremarkable. Stomach/Bowel: Stomach is within normal limits. Appendix appears normal. No evidence of bowel wall thickening, distention, or inflammatory changes. Circumferential wall thickening involving the sigmoid colon is identified, image  177/5. Nonspecific. Lymphatic: No enlarged lymph nodes within the abdomen or pelvis. Reproductive: Prostate is unremarkable. Other: No free fluid or fluid collections. Musculoskeletal: Degenerative disc disease identified within the lumbar spine. No aggressive lytic or sclerotic bone lesions. Review of the MIP images confirms the above findings. IMPRESSION: 1. Examination is positive for acute type-B aortic dissection from the left subclavian artery to the suprasellar react abdominal aorta. There is no evidence for arch vessel involvement or branch vessel involvement off the abdominal aorta. 2. Thoracic periaortic and mediastinal hematoma compatible with contained rupture of the aorta. 3. Pericardial effusion. 4. Small pleural effusions. 5. There is circumferential wall thickening involving the sigmoid colon. Etiology indeterminate. Advise correlation with colon cancer screening. 6. Critical Value/emergent results were called by telephone at the time of interpretation on 01/25/2018 at 3:40 pm to Dr. Daleen Bo , who verbally acknowledged these results. Electronically Signed   By: Kerby Moors M.D.   On: 01/25/2018 15:40     I have independently reviewed the above radiologic studies.  Recent Lab Findings: Lab Results  Component Value Date   WBC 14.6 (H) 01/25/2018   HGB 16.0 01/25/2018   HCT 47.0 01/25/2018   PLT 164 01/25/2018   GLUCOSE 106 (H) 01/25/2018   CHOL 63 11/26/2014   TRIG 76 11/26/2014   HDL 26 (L) 11/26/2014   LDLCALC 22 11/26/2014   ALT 6 (L) 01/25/2018   AST 13 (L) 01/25/2018   NA 136 01/25/2018   K 3.8 01/25/2018   CL 97 (L) 01/25/2018   CREATININE 0.90 01/25/2018   BUN 20 01/25/2018   CO2 27 01/25/2018   TSH 3.880 03/18/2014   INR 1.17 01/25/2018   HGBA1C 5.8 (H) 09/20/2012      Assessment / Plan:      Stanford type B thoracic aortic dissection originating distal to the left subclavian artery and extending through the diaphragm to the celiac axis.  The thoracic   aorta is moderately enlarged, the ascending aorta is 4 cm without involvement of dissection.  Patient has Parkinson's disease, dementia, is nonambulatory and lives in a nursing home.  He is not a candidate for surgery.  He will be managed medically with blood pressure control.  He is in chronic atrial fibrillation but will not be given anticoagulation because of the dissection.  I discussed with the patient his admission diagnosis of aortic dissection and the plan of care. Will use IV Cardene and IV labetalol to keep systolic pressure less than 130.   @ME1 @ 01/25/2018 9:12 PM

## 2018-01-25 NOTE — ED Triage Notes (Signed)
Pt resident of [facility] facility.  NP called and reports pt c/o chest pain that started around 1130 this morning.  Reports bp was initially 200/118.  They administered 325mg  aspirin and3 SL nitro. pain improved and bp decreased to 160/84.  Pt rates chest pain at 1.  Reports when pt started having chest pain he was diaphoretic, sob, and pain radiated to back.  Pt presently alert and oriented.

## 2018-01-25 NOTE — ED Notes (Signed)
Manual blood pressure with systolic in the 28'B.  Decreased nipride to .21mcg/ kg/min

## 2018-01-25 NOTE — ED Notes (Signed)
Patient attempted to urinate but was unsuccessful. Patient wearing brief diapers and they were wet. Wiped patient and placed dry towels in area.

## 2018-01-25 NOTE — ED Notes (Signed)
Notified Angela LPN at [facility] of pt's current condition and informed her [facility].  Pt declined to have staff notify any family.

## 2018-01-25 NOTE — Procedures (Signed)
Arterial Catheter Insertion Procedure Note Todd Page [id number] 04/26/1951  Procedure: Insertion of Arterial Catheter  Indications: Blood pressure monitoring  Procedure Details Consent: Risks of procedure as well as the alternatives and risks of each were explained to the (patient/caregiver).  Consent for procedure obtained. Time Out: Verified patient identification, verified procedure, site/side was marked, verified correct patient position, special equipment/implants available, medications/allergies/relevent history reviewed, required imaging and test results available.  Performed  Maximum sterile technique was used including antiseptics, cap, gloves, gown, hand hygiene, mask and sheet. Skin prep: Chlorhexidine; local anesthetic administered 20 gauge catheter was inserted into right radial artery using the Seldinger technique.  Evaluation Blood flow good; BP tracing good. Complications: No apparent complications. RT placed arrow catheter on first attempt.   Todd Page 01/25/2018

## 2018-01-25 NOTE — Progress Notes (Addendum)
  Amiodarone Drug - Drug Interaction Consult Note  Recommendations: Monitor bradycardia, AV block, and myocardial depression on labetalol. On risperdal and zyprexa PTA (not yet re-ordered and only ordered amiodarone bolus). F/u if amio drip ordered.  Amiodarone is metabolized by the cytochrome P450 system and therefore has the potential to cause many drug interactions. Amiodarone has an average plasma half-life of 50 days (range 20 to 100 days).   There is potential for drug interactions to occur several weeks or months after stopping treatment and the onset of drug interactions may be slow after initiating amiodarone.   []  Statins: Increased risk of myopathy. Simvastatin- restrict dose to 20mg  daily. Other statins: counsel patients to report any muscle pain or weakness immediately.  []  Anticoagulants: Amiodarone can increase anticoagulant effect. Consider warfarin dose reduction. Patients should be monitored closely and the dose of anticoagulant altered accordingly, remembering that amiodarone levels take several weeks to stabilize.  []  Antiepileptics: Amiodarone can increase plasma concentration of phenytoin, the dose should be reduced. Note that small changes in phenytoin dose can result in large changes in levels. Monitor patient and counsel on signs of toxicity.  [x]  Beta blockers: increased risk of bradycardia, AV block and myocardial depression. Sotalol - avoid concomitant use.  []   Calcium channel blockers (diltiazem and verapamil): increased risk of bradycardia, AV block and myocardial depression.  []   Cyclosporine: Amiodarone increases levels of cyclosporine. Reduced dose of cyclosporine is recommended.  []  Digoxin dose should be halved when amiodarone is started.  []  Diuretics: increased risk of cardiotoxicity if hypokalemia occurs.  []  Oral hypoglycemic agents (glyburide, glipizide, glimepiride): increased risk of hypoglycemia. Patient's glucose levels should be monitored  closely when initiating amiodarone therapy.   []  Drugs that prolong the QT interval:  Torsades de pointes risk may be increased with concurrent use - avoid if possible.  Monitor QTc, also keep magnesium/potassium WNL if concurrent therapy can't be avoided. Marland Kitchen Antibiotics: e.g. fluoroquinolones, erythromycin. . Antiarrhythmics: e.g. quinidine, procainamide, disopyramide, sotalol. . Antipsychotics: e.g. phenothiazines, haloperidol.  . Lithium, tricyclic antidepressants, and methadone. Thank Dennis Bast  Elicia Lamp [facility]  01/25/2018 6:36 PM

## 2018-01-25 NOTE — ED Provider Notes (Signed)
[facility] EMERGENCY DEPARTMENT Provider Note   CSN: [id number] Arrival date & time: 01/25/18  1313     History   Chief Complaint Chief Complaint  Patient presents with  . Chest Pain    HPI Todd Page is a 67 y.o. male.  He presents by EMS for evaluation of chest pain which started around 11:30 AM today.  At that time he was given care by providers at his nursing care facility.  He was given aspirin 325 mg, and 3 nitroglycerin, which improved his pain to 1/10, and his blood pressure which has been high.  He reports that the pain has been coming and going over the last 2 days, felt in the center of his chest rating through to his back.  Sometimes he sweats with it.  He denies nausea, vomiting, cough, shortness of breath, weakness or dizziness.  He lives in a nursing care facility.  He is a fair historian.  He denies prior similar problems.  He has a history of atrial fibrillation.  He is not currently taking a anticoagulant.  There are no other known modifying factors.  HPI  Past Medical History:  Diagnosis Date  . A-fib ([place])   . Anemia   . Atrial fibrillation ([place]) 06/2012   onset 06/2012; normal TSH; EF-45%  . CHF (congestive heart failure) ([place])   . Cholelithiasis    incidental finding on CT in 2013  . Cognitive communication deficit   . Dysphagia   . Feeding difficulties   . Gout   . Hyperlipidemia   . Hypertension    minimal coronary atherosclerosis by CT  . Hypokalemia   . Liver disease   . Noncompliance 09/21/2012  . Osteoporosis   . Overweight(278.02)   . Parkinson's disease ([place])   . Peripheral vascular disease ([place])   . Renal disorder   . Schizoaffective disorder ([place])    With depression  . Systolic dysfunction   . Thrombophilia ([place])   . Tinea cruris   . Vascular dementia   . Vitamin D deficiency     Patient Active Problem List   Diagnosis Date Noted  . Hyperglycemia 03/19/2014  . CHF (congestive heart failure) ([place]) 03/19/2014  . Acute  exacerbation of congestive heart failure ([place]) 03/18/2014  . Acute on chronic heart failure ([place]) 03/18/2014  . Chest pain 06/08/2013  . Tachypnea 05/25/2013  . Acute diastolic congestive heart failure ([place]) 05/25/2013  . Chronic kidney disease, stage III (moderate) ([place]) 05/25/2013  . Acute gout 05/25/2013  . Elevated troponin 05/18/2013  . Acute respiratory failure ([place]) 02/20/2013  . Lower extremity edema 02/17/2013  . Acute on chronic systolic HF (heart failure) ([place]) 12/25/2012  . Atrial fibrillation with rapid ventricular response ([place]) 12/24/2012  . Malignant hypertension 12/24/2012  . Dyspnea 12/24/2012  . Bilateral leg pain 12/24/2012  . Hypokalemia 12/24/2012  . Noncompliance 09/21/2012  . Hypertension 09/21/2012  . Atrial fibrillation ([place]) 07/26/2012  . Obesity 07/26/2012  . Schizoaffective disorder ([place]) 07/26/2012    Past Surgical History:  Procedure Laterality Date  . COLONOSCOPY  2006   normal screening examination  . prosthetic heart valve          Home Medications    Prior to Admission medications   Medication Sig Start Date End Date Taking? Authorizing Provider  amLODipine (NORVASC) 5 MG tablet Take 5 mg by mouth daily.    [provider]  aspirin EC 81 MG tablet Take 81 mg by mouth daily.    [provider]  atorvastatin (LIPITOR) 40 MG tablet Take 40 mg by mouth daily at 6 PM.    [provider]  cholecalciferol (VITAMIN D) 400 units TABS tablet Take 5,000 Units by mouth daily.    [provider]  divalproex (DEPAKOTE) 125 MG DR tablet Take 500 mg by mouth 2 (two) times daily.    [provider]  FLUoxetine (PROZAC) 20 MG tablet Take 20 mg by mouth daily.    [provider]  HYDROcodone-acetaminophen (NORCO/VICODIN) 5-325 MG tablet Take 1 tablet by mouth every 6 (six) hours as needed for moderate pain.    [provider]  lisinopril (PRINIVIL,ZESTRIL) 5 MG tablet Take 5 mg by mouth daily.     [provider]  metoprolol (LOPRESSOR) 50 MG tablet Take 50 mg by mouth 2 (two) times daily.    [provider]  OLANZapine (ZYPREXA) 15 MG tablet Take 15 mg by mouth at bedtime.    [provider]  potassium chloride (MICRO-K) 10 MEQ CR capsule Take 10 mEq by mouth daily.    [provider]  risperiDONE (RISPERDAL) 0.5 MG tablet Take 0.5 mg by mouth at bedtime.    [provider]  senna (SENOKOT) 8.6 MG tablet Take 1 tablet by mouth daily.    [provider]    Family History Family History  Problem Relation Age of Onset  . Hypertension Mother   . Liver disease Father     Social History Social History   Tobacco Use  . Smoking status: Never Smoker  . Smokeless tobacco: Never Used  Substance Use Topics  . Alcohol use: No    Comment: former   . Drug use: No     Allergies   Patient has no known allergies.   Review of Systems Review of Systems  All other systems reviewed and are negative.    Physical Exam Updated Vital Signs BP (!) 182/122   Pulse 62   Temp (!) 97.4 F (36.3 C) (Oral)   Resp 19   Ht 5\' 11"  (1.803 m)   Wt 104.3 kg (230 lb)   SpO2 91%   BMI 32.08 kg/m   Physical Exam  Constitutional: He is oriented to person, place, and time. He appears well-developed.  Elderly, frail  HENT:  Head: Normocephalic and atraumatic.  Right Ear: External ear normal.  Left Ear: External ear normal.  Eyes: Pupils are equal, round, and reactive to light. Conjunctivae and EOM are normal.  Neck: Normal range of motion and phonation normal. Neck supple.  Cardiovascular: Normal rate, regular rhythm and normal heart sounds.  Pulmonary/Chest: Effort normal and breath sounds normal. He exhibits no bony tenderness.  Abdominal: Soft. There is no tenderness.  Musculoskeletal: Normal range of motion. He exhibits no edema, tenderness or deformity.  Neurological: He is alert and oriented to person, place, and time. No cranial  nerve deficit or sensory deficit. He exhibits normal muscle tone. Coordination normal.  No dysarthria or aphasia.  Speaks somewhat slowly but is lucid.  Skin: Skin is warm, dry and intact.  Psychiatric: He has a normal mood and affect. His behavior is normal.  Nursing note and vitals reviewed.    ED Treatments / Results  Labs (all labs ordered are listed, but only abnormal results are displayed) Labs Reviewed  BASIC METABOLIC PANEL - Abnormal; Notable for the following components:      Result Value   Chloride 99 (*)    Glucose, Bld 110 (*)  All other components within normal limits  CBC - Abnormal; Notable for the following components:   WBC 14.6 (*)    All other components within normal limits  I-STAT CHEM 8, ED - Abnormal; Notable for the following components:   Chloride 97 (*)    Glucose, Bld 106 (*)    Calcium, Ion 1.08 (*)    All other components within normal limits  I-STAT TROPONIN, ED  I-STAT TROPONIN, ED    EKG EKG Interpretation  Date/Time:  Saturday January 25 2018 13:33:57 EDT Ventricular Rate:  113 PR Interval:    QRS Duration: 103 QT Interval:  401 QTC Calculation: 550 R Axis:   58 Text Interpretation:  Atrial flutter Borderline repolarization abnormality Prolonged QT interval Since last tracing rate slower Confirmed by Todd Page [phone]) on 01/25/2018 1:50:25 PM   Radiology Dg Chest Portable 1 View  Result Date: 01/25/2018 CLINICAL DATA:  Acute chest pain today. EXAM: PORTABLE CHEST 1 VIEW COMPARISON:  11/25/2014 and prior radiographs. FINDINGS: The LEFT mediastinum in the region of the aortic arch appears more prominent when compared to prior studies could be technical as this is portable, slightly lordotic and the patient is rotated. Cardiomegaly again identified. This is a low volume film. No airspace disease, pleural effusion or pneumothorax noted. IMPRESSION: Slightly more prominent LEFT SUPERIOR mediastinum in the region the aortic arch-question  technical as this is a portable, slightly lordotic and rotated study. If there is strong clinical concern for thoracic aortic abnormality recommend CTA chest. Otherwise consider short-term repeat chest radiograph. Cardiomegaly. Electronically Signed   By: Margarette Canada M.D.   On: 01/25/2018 14:00   Ct Angio Chest/abd/pel For Dissection W And/or W/wo  Result Date: 01/25/2018 CLINICAL DATA:  Chest and back pain.  Evaluate for dissection. EXAM: CT ANGIOGRAPHY CHEST, ABDOMEN AND PELVIS TECHNIQUE: Multidetector CT imaging through the chest, abdomen and pelvis was performed using the standard protocol during bolus administration of intravenous contrast. Multiplanar reconstructed images and MIPs were obtained and reviewed to evaluate the vascular anatomy. CONTRAST:  16mL ISOVUE-370 IOPAMIDOL (ISOVUE-370) INJECTION 76% COMPARISON:  CT a chest 09/20/2012 FINDINGS: CTA CHEST FINDINGS Cardiovascular: Moderate cardiac enlargement with a small to moderate dependent pericardial effusion. 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 There is evidence of periaortic and mediastinal hematoma compatible with contained rupture. The dissection flap does not involve the branches of the aortic arch or branches of the abdominal aorta. The main pulmonary artery appears patent. No central obstructing embolus identified. Mediastinum/Nodes: Normal appearance of the thyroid gland. Decreased AP diameter of the trachea is similar to previous exam and may reflect chronic tracheobronchomalacia. Normal appearance of the esophagus. No enlarged mediastinal or hilar lymph nodes. Lungs/Pleura: Small bilateral pleural effusions. Mild diffuse interstitial prominence identified bilaterally. Subsegmental atelectasis within the dependent lung zones identified. No airspace consolidation. Musculoskeletal: Multi level thoracic spondylosis. Review of the MIP images confirms the above findings. CTA ABDOMEN  AND PELVIS FINDINGS VASCULAR Aorta: No evidence for dissection beyond the supra celiac abdominal aorta. No aneurysm. Celiac: Patent without evidence of aneurysm, dissection, vasculitis or significant stenosis. SMA: Patent without evidence of aneurysm, dissection, vasculitis or significant stenosis. Renals: Both renal arteries are patent without evidence of aneurysm, dissection, vasculitis, fibromuscular dysplasia or significant stenosis. IMA: Patent without evidence of aneurysm, dissection, vasculitis or significant stenosis. Inflow: Patent without evidence of aneurysm, dissection, vasculitis or significant stenosis. Veins: There is mixed attenuation within the portal venous confluence and tributary vessels  to the SMV. This is nonspecific given the arterial phase of opacification in may represent mixing opacified and unopacified blood. Review of the MIP images confirms the above findings. NON-VASCULAR Hepatobiliary: No focal liver abnormality. Stones are noted within the gallbladder measuring up to 8 mm. Pancreas: Unremarkable. No pancreatic ductal dilatation or surrounding inflammatory changes. Spleen: Normal in size without focal abnormality. Adrenals/Urinary Tract: The adrenal glands are normal. Right kidney cyst measures 1.6 cm. Right renal calculus is identified measuring 8 mm. Urinary bladder is unremarkable. Stomach/Bowel: Stomach is within normal limits. Appendix appears normal. No evidence of bowel wall thickening, distention, or inflammatory changes. Circumferential wall thickening involving the sigmoid colon is identified, image 177/5. Nonspecific. Lymphatic: No enlarged lymph nodes within the abdomen or pelvis. Reproductive: Prostate is unremarkable. Other: No free fluid or fluid collections. Musculoskeletal: Degenerative disc disease identified within the lumbar spine. No aggressive lytic or sclerotic bone lesions. Review of the MIP images confirms the above findings. IMPRESSION: 1. Examination is  positive for acute type-B aortic dissection from the left subclavian artery to the suprasellar react abdominal aorta. There is no evidence for arch vessel involvement or branch vessel involvement off the abdominal aorta. 2. Thoracic periaortic and mediastinal hematoma compatible with contained rupture of the aorta. 3. Pericardial effusion. 4. Small pleural effusions. 5. There is circumferential wall thickening involving the sigmoid colon. Etiology indeterminate. Advise correlation with colon cancer screening. 6. Critical Value/emergent results were called by telephone at the time of interpretation on 01/25/2018 at 3:40 pm to Dr. Daleen Page , who verbally acknowledged these results. Electronically Signed   By: Kerby Moors M.D.   On: 01/25/2018 15:40    Procedures .Critical Care Performed by: Todd Bo, MD Authorized by: Todd Bo, MD   Critical care provider statement:    Critical care time (minutes):  55   Critical care start time:  01/25/2018 1:35 PM   Critical care end time:  01/25/2018 3:42 PM   Critical care time was exclusive of:  Separately billable procedures and treating other patients   Critical care was necessary to treat or prevent imminent or life-threatening deterioration of the following conditions:  Circulatory failure   Critical care was time spent personally by me on the following activities:  Blood draw for specimens, development of treatment plan with patient or surrogate, discussions with consultants, evaluation of patient's response to treatment, examination of patient, obtaining history from patient or surrogate, ordering and performing treatments and interventions, ordering and review of laboratory studies, pulse oximetry, re-evaluation of patient's condition, review of old charts and ordering and review of radiographic studies   (including critical care time)  Medications Ordered in ED Medications  esmolol (BREVIBLOC) 2000 mg / 100 mL (20 mg/mL) infusion (has  no administration in time range)  nitroPRUSSide (NIPRIDE) 50 mg in dextrose 5 % 250 mL (0.2 mg/mL) infusion (0.3 mcg/kg/min  104.3 kg Intravenous Rate/Dose Change 01/25/18 1609)  labetalol (NORMODYNE,TRANDATE) injection 10 mg (10 mg Intravenous Given 01/25/18 1600)  fentaNYL (SUBLIMAZE) injection 50 mcg (50 mcg Intravenous Given 01/25/18 1447)  iopamidol (ISOVUE-370) 76 % injection 100 mL (100 mLs Intravenous Contrast Given 01/25/18 1455)     Initial Impression / Assessment and Plan / ED Course  I have reviewed the triage vital signs and the nursing notes.  Pertinent labs & imaging results that were available during my care of the patient were reviewed by me and considered in my medical decision making (see chart for details).  Clinical Course as of Jan 26 1611  Sat Jan 25, 2018  1444 He has worsening upper back pain, fentanyl ordered.  Otherwise no change in clinical status at this time.  He remains alert and conversant and states he is hot.   [EW]  1444 Normal except chloride low  I-stat Chem 8, ED(!) [EW]  1444 I-stat troponin, ED [EW]  1444 Normal except chloride low  Basic metabolic panel(!) [EW]  7425 Elevated WBC  CBC(!) [EW]  1445 Normal  I-stat troponin, ED [EW]  9563 Concerning for widened mediastinum and aortic dissection  DG Chest Portable 1 View [EW]  1509 CT Angio Chest/Abd/Pel for Dissection W and/or W/WO [EW]  1509 CT Angio Chest/Abd/Pel for Dissection W and/or W/WO [EW]  1509 CT Angio Chest/Abd/Pel for Dissection W and/or W/WO [EW]  1520 Blood pressure transiently lowered after fentanyl, to 125/73, then has rebounded to hypertensive status.  Heart rate around 100 at this time.  At this point will start nitroprusside, and watch heart rate.   [EW]  1521 Consult to cardiothoracic surgery for assistance with management, and likely transferred to [facility]   [EW]  1527 Case results discussed with the radiologist who read the images which are consistent with type B  aortic dissection, with mediastinal hematoma.   [EW]  1527 HCT: 46.8 [EW]  8756 Case has been discussed with Dr. Darcey Nora, who accepts the patient and recommends transferring the patient to the emergency department at [facility], because he is going to the operating room at this time with another patient.  I discussed the case with Dr. Harmon Pier, ED at [facility] who accepts the patient.   [EW]    Clinical Course User Index [EW] Todd Bo, MD     Patient Vitals for the past 24 hrs:  BP Temp Temp src Pulse Resp SpO2 Height Weight  01/25/18 1556 (!) 182/122 - - 62 19 91 % - -  01/25/18 1551 (!) 136/102 - - 87 18 95 % - -  01/25/18 1545 (!) 159/132 - - (!) 34 17 93 % - -  01/25/18 1539 (!) 137/98 - - 61 18 95 % - -  01/25/18 1535 (!) 159/109 - - 72 20 96 % - -  01/25/18 1531 (!) 159/125 - - 67 17 97 % - -  01/25/18 1523 (!) 198/105 (!) 97.4 F (36.3 C) Oral 91 18 97 % - -  01/25/18 1515 (!) 198/105 - - 91 14 97 % - -  01/25/18 1500 126/78 - - 98 14 92 % - -  01/25/18 1410 - - - 86 16 97 % - -  01/25/18 1404 (!) 156/114 - - 68 13 92 % - -  01/25/18 1356 - - - 67 19 96 % - -  01/25/18 1328 (!) 152/104 97.6 F (36.4 C) Oral 80 16 100 % - -  01/25/18 1325 - - - - - - 5\' 11"  (1.803 m) 104.3 kg (230 lb)    3:43 PM Reevaluation with update and discussion. After initial assessment and treatment, an updated evaluation reveals he continues to be alert and responsive.  He states that he would like to be resuscitated, if needed.  I asked him if he wanted his family members to come here and he states he does not get along with his brother so did not want to call them.  Patient updated on findings and plan and is in agreement. Todd Page   MDM-chest and back pain secondary to be aortic dissection with mediastinal hematoma.  Presents upon arrival requiring treatment with intravenous medications to control blood pressure and heart rate.  Initial troponin normal, doubt ACS.  Nursing Notes  Reviewed/ Care Coordinated Applicable Imaging Reviewed Interpretation of Laboratory Data incorporated into ED treatment   Plan-transferred to [facility] emergency department  Final Clinical Impressions(s) / ED Diagnoses   Final diagnoses:  Aortic dissection, thoracoabdominal (HCC)  Mediastinal hematoma, initial encounter    ED Discharge Orders    None       Todd Bo, MD 01/25/18 [phone]

## 2018-01-26 ENCOUNTER — Inpatient Hospital Stay (HOSPITAL_COMMUNITY): Payer: Medicare Other

## 2018-01-26 DIAGNOSIS — I361 Nonrheumatic tricuspid (valve) insufficiency: Secondary | ICD-10-CM

## 2018-01-26 LAB — COMPREHENSIVE METABOLIC PANEL
ALT: 15 U/L — ABNORMAL LOW (ref 17–63)
AST: 19 U/L (ref 15–41)
Albumin: 2.6 g/dL — ABNORMAL LOW (ref 3.5–5.0)
Alkaline Phosphatase: 46 U/L (ref 38–126)
Anion gap: 8 (ref 5–15)
BUN: 21 mg/dL — ABNORMAL HIGH (ref 6–20)
CO2: 25 mmol/L (ref 22–32)
Calcium: 8.3 mg/dL — ABNORMAL LOW (ref 8.9–10.3)
Chloride: 103 mmol/L (ref 101–111)
Creatinine, Ser: 1.13 mg/dL (ref 0.61–1.24)
GFR calc Af Amer: >60 mL/min (ref >60)
GFR calc non Af Amer: >60 mL/min (ref >60)
Glucose, Bld: 118 mg/dL — ABNORMAL HIGH (ref 65–99)
Potassium: 3.6 mmol/L (ref 3.5–5.1)
Sodium: 136 mmol/L (ref 135–145)
Total Bilirubin: 0.8 mg/dL (ref 0.3–1.2)
Total Protein: 5.1 g/dL — ABNORMAL LOW (ref 6.5–8.1)

## 2018-01-26 LAB — CBC
HCT: 40.4 % (ref 39.0–52.0)
Hemoglobin: 12.7 g/dL — ABNORMAL LOW (ref 13.0–17.0)
MCH: 27.1 pg (ref 26.0–34.0)
MCHC: 31.4 g/dL (ref 30.0–36.0)
MCV: 86.1 fL (ref 78.0–100.0)
Platelets: 144 10*3/uL — ABNORMAL LOW (ref 150–400)
RBC: 4.69 MIL/uL (ref 4.22–5.81)
RDW: 15 % (ref 11.5–15.5)
WBC: 9 10*3/uL (ref 4.0–10.5)

## 2018-01-26 LAB — GLUCOSE, CAPILLARY
GLUCOSE-CAPILLARY: 120 mg/dL — AB (ref 65–99)
GLUCOSE-CAPILLARY: 130 mg/dL — AB (ref 65–99)
Glucose-Capillary: 98 mg/dL (ref 65–99)
Glucose-Capillary: 145 mg/dL — ABNORMAL HIGH (ref 65–99)

## 2018-01-26 LAB — APTT: aPTT: 30 seconds (ref 24–36)

## 2018-01-26 LAB — ECHOCARDIOGRAM COMPLETE
Height: 71 in
Weight: 3629.65 oz

## 2018-01-26 LAB — PROTIME-INR
INR: 1.2
Prothrombin Time: 15.1 seconds (ref 11.4–15.2)

## 2018-01-26 LAB — MRSA PCR SCREENING: MRSA by PCR: NEGATIVE

## 2018-01-26 MED ORDER — LABETALOL HCL 5 MG/ML IV SOLN
10.0000 mg | INTRAVENOUS | Status: DC | PRN
  Administered 2018-01-26 (×2): 10 mg via INTRAVENOUS
  Filled 2018-01-26 (×3): qty 4

## 2018-01-26 MED ORDER — ORAL CARE MOUTH RINSE
15.0000 mL | Freq: Two times a day (BID) | OROMUCOSAL | Status: DC
  Administered 2018-01-27 – 2018-01-31 (×9): 15 mL via OROMUCOSAL

## 2018-01-26 MED ORDER — IPRATROPIUM-ALBUTEROL 0.5-2.5 (3) MG/3ML IN SOLN
3.0000 mL | Freq: Three times a day (TID) | RESPIRATORY_TRACT | Status: DC
  Administered 2018-01-26 – 2018-01-27 (×3): 3 mL via RESPIRATORY_TRACT
  Filled 2018-01-26 (×3): qty 3

## 2018-01-26 MED ORDER — ALBUTEROL SULFATE (2.5 MG/3ML) 0.083% IN NEBU
2.5000 mg | INHALATION_SOLUTION | RESPIRATORY_TRACT | Status: DC | PRN
  Administered 2018-01-28: 2.5 mg via RESPIRATORY_TRACT
  Filled 2018-01-26: qty 3

## 2018-01-26 NOTE — Plan of Care (Signed)
SBP > 130 per MD order utilizing Cardene drip.  Pt calm, cooperative without s/s anxiety.

## 2018-01-26 NOTE — Care Management Note (Signed)
Case Management Note Marvetta Gibbons RN, BSN Unit 4E-Case Manager-- [place] coverage [phone]  Patient Details  Name: Todd Page MRN: [id number] Date of Birth: 04/07/1951  Subjective/Objective:  Pt admitted with [place] type B thoracic aortic dissection not a surgical candidate- plan to treat medically                 Action/Plan: PTA pt lived at [facility]- nonambulatory and is wheelchair bound, CSW has been consulted for transition needs for return to SNF when medically stable.    Expected Discharge Date:  02/03/18               Expected Discharge Plan:  Skilled Nursing Facility  In-House Referral:  Clinical Social Work  Discharge planning Services  CM Consult  Post Acute Care Choice:    Choice offered to:     DME Arranged:    DME Agency:     HH Arranged:    [place] Agency:     Status of Service:  In process, will continue to follow  If discussed at Long Length of Stay Meetings, dates discussed:    Discharge Disposition: skilled facility   Additional Comments:  Dawayne Patricia, RN 01/26/2018, 11:02 AM

## 2018-01-26 NOTE — Progress Notes (Signed)
  Subjective: Medical treatment of [place] type B descending aortic dissection Pain is improved but not resolved Blood pressure control is improved and patient has been transitioned off IV Cardene and labetalol infusions Will need physical therapy tomorrow since patient is nonambulatory and lives in a group home.  Objective: Vital signs in last 24 hours: Temp:  [97.3 F (36.3 C)-97.9 F (36.6 C)] 97.4 F (36.3 C) (03/31 1620) Pulse Rate:  [37-203] 116 (03/31 1815) Cardiac Rhythm: Atrial fibrillation (03/31 0800) Resp:  [13-39] 23 (03/31 1815) BP: (72-151)/(45-111) 151/98 (03/31 1815) SpO2:  [83 %-100 %] 95 % (03/31 1815) Arterial Line BP: (88-138)/(43-69) 138/55 (03/31 1100) Weight:  [223 lb 12.3 oz (101.5 kg)-226 lb 13.7 oz (102.9 kg)] 226 lb 13.7 oz (102.9 kg) (03/31 0530)  Hemodynamic parameters for last 24 hours:    Intake/Output from previous day: 03/30 0701 - 03/31 0700 In: 782.7 [P.O.:240; I.V.:542.7] Out: -  Intake/Output this shift: Total I/O In: 300 [I.V.:300] Out: 650 [Urine:650]  Alert and responsive Breath sounds clear No focal motor deficit-generally weak Peripheral pulses intact from baseline exam Lab Results: Recent Labs    01/25/18 1347 01/25/18 1359 01/26/18 0341  WBC 14.6*  --  9.0  HGB 15.3 16.0 12.7*  HCT 46.8 47.0 40.4  PLT 164  --  144*   BMET:  Recent Labs    01/25/18 1347 01/25/18 1359 01/26/18 0341  NA 138 136 136  K 3.9 3.8 3.6  CL 99* 97* 103  CO2 27  --  25  GLUCOSE 110* 106* 118*  BUN 20 20 21*  CREATININE 0.87 0.90 1.13  CALCIUM 8.9  --  8.3*    PT/INR:  Recent Labs    01/26/18 0341  LABPROT 15.1  INR 1.20   ABG    Component Value Date/Time   PHART 7.361 05/18/2013 1305   HCO3 29.7 (H) 05/18/2013 1305   TCO2 29 01/25/2018 1359   O2SAT 98.2 05/18/2013 1305   CBG (last 3)  Recent Labs    01/26/18 0811 01/26/18 1208 01/26/18 1649  GLUCAP 98 120* 130*    Assessment/Plan: S/P  Type B thoracic aortic  dissection distal to left subclavian Continue medical therapy, hold anticoagulation  LOS: 1 day    Tharon Aquas Trigt III 01/26/2018

## 2018-01-26 NOTE — Progress Notes (Signed)
  Echocardiogram 2D Echocardiogram has been performed.  Todd Page 01/26/2018, 10:58 AM

## 2018-01-27 LAB — GLUCOSE, CAPILLARY
GLUCOSE-CAPILLARY: 128 mg/dL — AB (ref 65–99)
GLUCOSE-CAPILLARY: 106 mg/dL — AB (ref 65–99)
Glucose-Capillary: 93 mg/dL (ref 65–99)
Glucose-Capillary: 88 mg/dL (ref 65–99)
Glucose-Capillary: 96 mg/dL (ref 65–99)

## 2018-01-27 LAB — CBC
HCT: 42.7 % (ref 39.0–52.0)
Hemoglobin: 13.6 g/dL (ref 13.0–17.0)
MCH: 27.9 pg (ref 26.0–34.0)
MCHC: 31.9 g/dL (ref 30.0–36.0)
MCV: 87.7 fL (ref 78.0–100.0)
Platelets: 120 10*3/uL — ABNORMAL LOW (ref 150–400)
RBC: 4.87 MIL/uL (ref 4.22–5.81)
RDW: 15.5 % (ref 11.5–15.5)
WBC: 9.8 10*3/uL (ref 4.0–10.5)

## 2018-01-27 LAB — BASIC METABOLIC PANEL
Anion gap: 10 (ref 5–15)
BUN: 17 mg/dL (ref 6–20)
CO2: 25 mmol/L (ref 22–32)
Calcium: 8.4 mg/dL — ABNORMAL LOW (ref 8.9–10.3)
Chloride: 105 mmol/L (ref 101–111)
Creatinine, Ser: 1 mg/dL (ref 0.61–1.24)
GFR calc Af Amer: >60 mL/min (ref >60)
GFR calc non Af Amer: >60 mL/min (ref >60)
Glucose, Bld: 111 mg/dL — ABNORMAL HIGH (ref 65–99)
Potassium: 4 mmol/L (ref 3.5–5.1)
Sodium: 140 mmol/L (ref 135–145)

## 2018-01-27 MED ORDER — DIVALPROEX SODIUM 250 MG PO DR TAB
375.0000 mg | DELAYED_RELEASE_TABLET | Freq: Two times a day (BID) | ORAL | Status: DC
  Administered 2018-01-27 – 2018-01-31 (×8): 375 mg via ORAL
  Filled 2018-01-27 (×8): qty 1

## 2018-01-27 MED ORDER — FENTANYL CITRATE (PF) 100 MCG/2ML IJ SOLN: 50.0000 ug | INTRAMUSCULAR | Status: DC | PRN

## 2018-01-27 NOTE — Evaluation (Signed)
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 Pt is being treated medically. PMH - schizophrenia, Parkinson's, DM, HTN, afib, chf, gout, PVD, dementia  Clinical Impression  Pt at baseline for mobility (has been mechanical lift at facility). No skilled PT needs. Recommend pt OOB with nursing using mechanical lift.     Follow Up Recommendations SNF(at prior level of care)    Equipment Recommendations  None recommended by PT    Recommendations for Other Services       Precautions / Restrictions Precautions Precautions: Fall      Mobility  Bed Mobility Overal bed mobility: Needs Assistance Bed Mobility: Supine to Sit;Sit to Supine;Rolling Rolling: Min assist   Supine to sit: Mod assist Sit to supine: Mod assist   General bed mobility comments: Assist to bring hips over with rolling. Assist to elevate trunk and bring hips to EOB. Assist to bring legs back up into bed when returning to supine.  Transfers Overall transfer level: Needs assistance Equipment used: Ambulation equipment used             General transfer comment: Attempted sit to stand with max assist with Stedy and pt unable. Used maxisky to go bed to recliner. O2 removed and pt >95%. O2 left off.  Ambulation/Gait             General Gait Details: Non ambulatory  Stairs            Wheelchair Mobility    Modified Rankin (Stroke Patients Only)       Balance Overall balance assessment: Needs assistance Sitting-balance support: Bilateral upper extremity supported;Feet supported Sitting balance-Leahy Scale: Poor Sitting balance - Comments: UE assist and initial min assist progressing to min guard                                     Pertinent Vitals/Pain Pain Assessment: No/denies pain    Home Living Family/patient  expects to be discharged to:: Skilled nursing facility                      Prior Function Level of Independence: Needs assistance   Gait / Transfers Assistance Needed: Pt initially stated he was able to stand pivot to w/c with assist but later stated they used a mechanical lift to get to w/c           Hand Dominance        Extremity/Trunk Assessment   Upper Extremity Assessment Upper Extremity Assessment: Generalized weakness    Lower Extremity Assessment Lower Extremity Assessment: Generalized weakness;RLE deficits/detail;LLE deficits/detail RLE Deficits / Details: Pt with knee flexion contractures ~10 degrees and hips in external rotation LLE Deficits / Details: Pt with knee flexion contractures ~10 degrees and hips in external rotation       Communication   Communication: No difficulties  Cognition Arousal/Alertness: Awake/alert Behavior During Therapy: WFL for tasks assessed/performed Overall Cognitive Status: No family/caregiver present to determine baseline cognitive functioning                                        General Comments      Exercises     Assessment/Plan  PT Assessment Patent does not need any further PT services  PT Problem List         PT Treatment Interventions      PT Goals (Current goals can be found in the Care Plan section)  Acute Rehab PT Goals PT Goal Formulation: All assessment and education complete, DC therapy    Frequency     Barriers to discharge        Co-evaluation               AM-PAC PT "6 Clicks" Daily Activity  Outcome Measure Difficulty turning over in bed (including adjusting bedclothes, sheets and blankets)?: Unable Difficulty moving from lying on back to sitting on the side of the bed? : Unable Difficulty sitting down on and standing up from a chair with arms (e.g., wheelchair, bedside commode, etc,.)?: Unable Help needed moving to and from a bed to chair (including a  wheelchair)?: Total Help needed walking in hospital room?: Total Help needed climbing 3-5 steps with a railing? : Total 6 Click Score: 6    End of Session Equipment Utilized During Treatment: Gait belt Activity Tolerance: Patient tolerated treatment well Patient left: in chair;with call bell/phone within reach;with chair alarm set Nurse Communication: Mobility status;Need for lift equipment PT Visit Diagnosis: Other abnormalities of gait and mobility (R26.89)    Time: 9735-3299 PT Time Calculation (min) (ACUTE ONLY): 36 min   Charges:   PT Evaluation $PT Eval Moderate Complexity: 1 Mod PT Treatments $Therapeutic Activity: 8-22 mins   PT G Codes:        [facility] PT [place] 01/27/2018, 10:50 AM

## 2018-01-27 NOTE — Discharge Instructions (Signed)
Thoracic Aortic Aneurysm An aneurysm is a bulge in an artery. It happens when blood pushes up against a weakened or damaged artery wall. A thoracic aortic aneurysm is an aneurysm that occurs in the first part of the aorta, between the heart and the diaphragm. The aorta is the main artery of the body. It supplies blood from the heart to the rest of the body. Some aneurysms may not cause symptoms or problems. However, the major concern with a thoracic aortic aneurysm is that it can enlarge and burst (rupture), or blood can flow between the layers of the wall of the aorta through a tear (aorticdissection). Both of these conditions can cause bleeding inside the body and can be life-threatening if they are not diagnosed and treated right away. What are the causes? The exact cause of this condition is not known. What increases the risk? The following factors may make you more likely to develop this condition:  Being age 65 or older.  Having a hardening of the arteries caused by the buildup of fat and other substances in the lining of a blood vessel (arteriosclerosis).  Having inflammation of the walls of an artery (arteritis).  Having a genetic disease that weakens the body's connective tissue, such as Marfan syndrome.  Having an injury or trauma to the aorta.  Having an infection that is caused by bacteria, such as syphilis or staphylococcus, in the wall of the aorta (infectious aortitis).  Having high blood pressure (hypertension).  Being male.  Being white (Caucasian).  Having high cholesterol.  Having a family history of aneurysms.  Using tobacco.  Having chronic obstructive pulmonary disease (COPD).  What are the signs or symptoms? Symptoms of this condition vary depending on the size and rate of growth of the aneurysm. Most grow slowly and do not cause any symptoms. When symptoms do occur, they may include:  Pain in the chest, back, sides, or abdomen. The pain may vary in  intensity. A sudden onset of severe pain may indicate that the aneurysm has ruptured.  Hoarseness.  Cough.  Shortness of breath.  Swallowing problems.  Swelling in the face, arms, or legs.  Fever.  Unexplained weight loss.  How is this diagnosed? This condition may be diagnosed with:  An ultrasound.  X-rays.  A CT scan.  An MRI.  Tests to check the arteries for damage or blockages (angiogram).  Most unruptured thoracic aortic aneurysms cause no symptoms, so they are often found during exams for other conditions. How is this treated? Treatment for this condition depends on:  The size of the aneurysm.  How fast the aneurysm is growing.  Your age.  Risk factors for rupture.  Aneurysms that are smaller than 2.2 inches (5.5 cm) may be managed by using medicines to control blood pressure, manage pain, or fight infection. You may need regular monitoring to see if the aneurysm is getting bigger. Your health care provider may recommend that you have an ultrasound every year or every 6 months. How often you need to have an ultrasound depends on the size of the aneurysm, how fast it is growing, and whether you have a family history of aneurysms. Surgical repair may be needed if your aneurysm is larger than 2.2 inches or if it is growing quickly. Follow these instructions at home: Eating and drinking  Eat a healthy diet. Your health care provider may recommend that you: ? Lower your salt (sodium) intake. In some people, too much salt can raise blood pressure and increase   the risk of thoracic aortic aneurysm. ? Avoid foods that are high in saturated fat and cholesterol, such as red meat and dairy. ? Eat a diet that is low in sugar. ? Increase your fiber intake by including whole grains, vegetables, and fruits in your diet. Eating these foods may help to lower blood pressure.  Limit or avoid alcohol as recommended by your health care provider. Lifestyle  Follow instructions  from your health care provider about healthy lifestyle habits. Your health care provider may recommend that you: ? Do not use any products that contain nicotine or tobacco, such as cigarettes and e-cigarettes. If you need help quitting, ask your health care provider. ? Keep your blood pressure within normal limits. The target limit for most people is below 120/80. Check your blood pressure regularly. If it is high, ask your health care provider about ways that you can control it. ? Keep your blood sugar (glucose) level and cholesterol levels within normal limits. Target limits for most people are:  Blood glucose level: Less than 100 mg/dL.  Total cholesterol level: Less than 200 mg/dL. ? Maintain a healthy weight. Activity  Stay physically active and exercise regularly. Talk with your health care provider about how often you should exercise and ask which types of exercise are safe for you.  Avoid heavy lifting and activities that take a lot of effort (are strenuous). Ask your health care provider what activities are safe for you. General instructions  Keep all follow-up visits as told by your health care provider. This is important. ? Talk with your health care provider about regular screenings to see if the aneurysm is getting bigger.  Take over-the-counter and prescription medicines only as told by your health care provider. Contact a health care provider if:  You have discomfort in your upper back, neck, or abdomen.  You have trouble swallowing.  You have a cough or hoarseness.  You have a family history of aneurysms.  You have unexplained weight loss. Get help right away if:  You have sudden, severe pain in your upper back and abdomen. This pain may move into your chest and arms.  You have shortness of breath.  You have a fever. This information is not intended to replace advice given to you by your health care provider. Make sure you discuss any questions you have with your  health care provider. Document Released: 10/15/2005 Document Revised: 07/27/2016 Document Reviewed: 07/27/2016 Elsevier Interactive Patient Education  2018 [employer]  

## 2018-01-27 NOTE — Progress Notes (Signed)
  Subjective: Minimal chest, epigastric pain Still titrating meds for blood pressure control and patient was on IV Cardene for a few hours last night Overall looks improved and ready to transfer to subacute We will plan follow-up CT scan later in week with plans for him to return back to his [facility] skilled nursing facility  Objective: Vital signs in last 24 hours: Temp:  [98 F (36.7 C)-98.9 F (37.2 C)] 98.9 F (37.2 C) (04/01 1546) Pulse Rate:  [66-256] 108 (04/01 1546) Cardiac Rhythm: Ventricular tachycardia (04/01 1359) Resp:  [15-31] 30 (04/01 1546) BP: (93-169)/(68-117) 149/96 (04/01 1546) SpO2:  [92 %-100 %] 97 % (04/01 1546) Weight:  [225 lb 1.4 oz (102.1 kg)] 225 lb 1.4 oz (102.1 kg) (04/01 0500)  Hemodynamic parameters for last 24 hours:    Intake/Output from previous day: 03/31 0701 - 04/01 0700 In: 1170.8 [P.O.:480; I.V.:690.8] Out: 1450 [Urine:1450] Intake/Output this shift: Total I/O In: 1045 [P.O.:465; I.V.:580] Out: 400 [Urine:400]  Exam Alert comfortable Sinus rhythm Lungs clear Abdomen soft No focal neuro deficit  Lab Results: Recent Labs    01/26/18 0341 01/27/18 0237  WBC 9.0 9.8  HGB 12.7* 13.6  HCT 40.4 42.7  PLT 144* 120*   BMET:  Recent Labs    01/26/18 0341 01/27/18 0237  NA 136 140  K 3.6 4.0  CL 103 105  CO2 25 25  GLUCOSE 118* 111*  BUN 21* 17  CREATININE 1.13 1.00  CALCIUM 8.3* 8.4*    PT/INR:  Recent Labs    01/26/18 0341  LABPROT 15.1  INR 1.20   ABG    Component Value Date/Time   PHART 7.361 05/18/2013 1305   HCO3 29.7 (H) 05/18/2013 1305   TCO2 29 01/25/2018 1359   O2SAT 98.2 05/18/2013 1305   CBG (last 3)  Recent Labs    01/26/18 2154 01/27/18 0750 01/27/18 1232  GLUCAP 145* 93 88    Assessment/Plan: S/P  Continue medical therapy of Stanford type B aortic dissection-avoid anticoagulants at this time Titrate blood pressure medications Follow-up CTA later this week   LOS: 2 days     Tharon Aquas Trigt III 01/27/2018

## 2018-01-27 NOTE — Discharge Summary (Addendum)
Physician Discharge Summary       [address]       [place] [address]             [phone]    Patient ID: Todd Page MRN: [id number] DOB/AGE: 66/30/1952 66 y.o.  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  The patient has had chest pain for the past 2-3 days.  Also involving the upper thorax.  No syncope no neurologic symptoms, no abdominal pain.  Diagnosis was made by CTA performed at outside hospital.  Images were personally reviewed.  The ascending aorta is proximate 4 cm in diameter.  The dissection starts distal to the left subclavian artery and there is thrombus in the false lumen with a total diameter of 5 cm.  The false lumen extends to the abdominal aorta just above the renal arteries and then resolves.  There is no pleural effusion.  Stanford type B thoracic aortic dissection originating distal to the left subclavian artery and extending through the diaphragm to the celiac axis.  The thoracic  aorta is moderately enlarged, the ascending aorta is 4 cm without involvement of dissection. He is in chronic atrial fibrillation but will not be given anticoagulation because of the dissection. He was admitted for medical management of the Stanford Type B aortic dissection and strict control of his blood pressure.  Brief Hospital Course:  Patient has remained afebrile and hemodynamically stable.  His blood pressure was initially controlled with Labetalol and Nicardipine drips. and hemodynamically stable.  His blood pressure was initially controlled with Labetalol and Nicardipine drips. He was switched to oral antihypertensives. His  blood pressure remains well controlled on  Clonidine, Lisinopril, Metoprolol Tartrate.  He developed Atrial Fibrillation.  He was treated with Amiodarone and remains rate controlled.  He is not a candidate for oral anticoagulation. Physical therapy was arranged as he is mostly non ambulatory. He is on room air, tolerating a diet.  Repeat CTA of the chest showedType B thoracic aortic dissection is again noted which is stable in size and appearance. There is a stable amount of intramural and mediastinal hematoma present suggesting contained rupture as described on prior exam. Great vessels are widely patent without significant stenosis. Marland Kitchen  He is now pain free.  He is medically stable for discharge back to [facility] today.  Latest Vital Signs: Blood pressure (!) 134/93, pulse 67, temperature (!) 97.4 F (36.3 C), temperature source Oral, resp. rate (!) 27, height 5\' 11"  (1.803 m), weight 234 lb 12.6 oz (106.5 kg), SpO2 92 %.   Discharge Condition: Stable and discharged to a group home.  Recent laboratory studies:  Lab Results  Component Value Date   WBC 10.7 (H) 01/31/2018   HGB 11.8 (L) 01/31/2018   HCT 37.8 (L) 01/31/2018   MCV 87.1 01/31/2018   PLT 158 01/31/2018   Lab Results  Component Value Date   NA 138 01/31/2018   K 4.1 01/31/2018   CL 103 01/31/2018   CO2 26 01/31/2018   CREATININE 0.86 01/31/2018   GLUCOSE 85 01/31/2018      Diagnostic Studies: Dg Chest Port 1 View  Result Date: 01/28/2018 CLINICAL DATA:  Status post aortic dissection distal to the left subclavian artery  origin. History of CHF and hypertension EXAM: PORTABLE CHEST 1 VIEW COMPARISON:  Portable chest x-ray of January 26, 2018 FINDINGS: The right hemidiaphragm remains higher than the left. There is new partial obscuration of the left hemidiaphragm however. There small amounts of pleural fluid likely laying layering posteriorly, bilaterally. The cardiac silhouette is enlarged. The pulmonary interstitial markings are mildly increased. There is  calcification in the wall of the aortic arch. There is widening of the mediastinum similar to that seen previously. IMPRESSION: Persistent mediastinal widening consistent with known aortic dissection. Stable cardiomegaly. Mild central pulmonary vascular congestion and interstitial edema. Probable left lower lobe atelectasis. Small bilateral pleural effusions. Electronically Signed   By: David  Martinique M.D.   On: 01/28/2018 08:06   Dg Chest Port 1 View  Result Date: 01/26/2018 CLINICAL DATA:  Chest pain EXAM: PORTABLE CHEST 1 VIEW COMPARISON:  CT from yesterday FINDINGS: Cardiomegaly and vascular pedicle widening with rightward displacement of the trachea, similar to previous radiograph when allowing for differences in technique (upright previously) and inflation. Mild atelectatic type opacities. No Kerley lines. No pneumothorax. IMPRESSION: 1. Known aortic dissection with similar cardiomediastinal enlargement. 2. Low volumes with mild atelectasis.  Negative for failure. Electronically Signed   By: Monte Fantasia M.D.   On: 01/26/2018 09:30   Dg Chest Portable 1 View  Result Date: 01/25/2018 CLINICAL DATA:  Acute chest pain today. EXAM: PORTABLE CHEST 1 VIEW COMPARISON:  11/25/2014 and prior radiographs. FINDINGS: The LEFT mediastinum in the region of the aortic arch appears more prominent when compared to prior studies could be technical as this is portable, slightly lordotic and the patient is rotated. Cardiomegaly again identified. This is a low volume film. No airspace disease, pleural effusion or pneumothorax noted. IMPRESSION: Slightly more prominent LEFT SUPERIOR mediastinum in the region the aortic arch-question technical as this is a portable, slightly lordotic and rotated study. If there is strong clinical concern for thoracic aortic abnormality recommend CTA chest. Otherwise consider short-term repeat chest radiograph. Cardiomegaly. Electronically Signed   By: Margarette Canada M.D.   On: 01/25/2018  14:00   Ct Angio Chest/abd/pel For Dissection W And/or W/wo  Result Date: 01/30/2018 CLINICAL DATA:  Thoracic aortic dissection. EXAM: CT ANGIOGRAPHY CHEST, ABDOMEN AND PELVIS TECHNIQUE: Multidetector CT imaging through the chest, abdomen and pelvis was performed using the standard protocol during bolus administration of intravenous contrast. Multiplanar reconstructed images and MIPs were obtained and reviewed to evaluate the vascular anatomy. CONTRAST:  143mL ISOVUE-370 IOPAMIDOL (ISOVUE-370) INJECTION 76% COMPARISON:  CT scan of January 25, 2018. FINDINGS: CTA CHEST FINDINGS Cardiovascular: Continued presence of type B thoracic aortic dissection is noted. There continues remain intramural and mediastinal hematoma concerning for possible contained rupture. Mild pericardial effusion is noted which was present on prior exam. The great vessels are widely patent without significant stenosis. Mediastinum/Nodes: No enlarged mediastinal, hilar, or axillary lymph nodes. Thyroid gland, trachea, and esophagus demonstrate no significant findings. Lungs/Pleura: No pneumothorax is noted. Moderate bilateral pleural effusions are noted with adjacent subsegmental atelectasis. Musculoskeletal: No chest wall abnormality. No acute or significant osseous findings. Review of the MIP images confirms the above findings. CTA ABDOMEN AND PELVIS FINDINGS VASCULAR Aorta: Thoracic aortic dissection extends to supraceliac aorta, but is not seen distally. Atherosclerosis of abdominal aorta is noted without aneurysm. Celiac: Patent without evidence of aneurysm, dissection, vasculitis or significant stenosis. SMA: Patent without evidence of aneurysm, dissection, vasculitis or significant stenosis. Renals: Both renal arteries are patent without evidence of aneurysm, dissection, vasculitis, fibromuscular dysplasia  or significant stenosis. IMA: Patent without evidence of aneurysm, dissection, vasculitis or significant stenosis. Inflow: Patent  without evidence of aneurysm, dissection, vasculitis or significant stenosis. Veins: No obvious venous abnormality within the limitations of this arterial phase study. Review of the MIP images confirms the above findings. NON-VASCULAR Hepatobiliary: Cholelithiasis is noted. No biliary dilatation is noted. Liver is unremarkable. Pancreas: Unremarkable. No pancreatic ductal dilatation or surrounding inflammatory changes. Spleen: Normal in size without focal abnormality. Adrenals/Urinary Tract: Adrenal glands appear normal. Stable exophytic right renal cyst is noted. No hydronephrosis or renal obstruction is noted. Bilateral nephrolithiasis is noted. Urinary bladder is unremarkable. Stomach/Bowel: Stomach is within normal limits. Appendix appears normal. No evidence of bowel wall thickening, distention, or inflammatory changes. Lymphatic: No significant adenopathy is noted. Reproductive: Prostate is unremarkable. Other: No abdominal wall hernia or abnormality. No abdominopelvic ascites. Musculoskeletal: No acute or significant osseous findings. Review of the MIP images confirms the above findings. IMPRESSION: Type B thoracic aortic dissection is again noted which is stable in size and appearance. There is a stable amount of intramural and mediastinal hematoma present suggesting contained rupture as described on prior exam. Great vessels are widely patent without significant stenosis. Stable pericardial effusion. Mild bilateral pleural effusions are noted which are increased in size compared to prior exam, with associated subsegmental atelectasis. Bilateral nonobstructive nephrolithiasis. No hydronephrosis or renal obstruction is noted. Cholelithiasis. Thoracic aortic dissection is seen to extend to the supraceliac abdominal aorta, but no dissection or aneurysm is seen more distally. Mesenteric and renal arteries are widely patent without significant stenosis. Aortic Atherosclerosis (ICD10-I70.0). Electronically Signed    By: Marijo Conception, M.D.   On: 01/30/2018 12:07   Ct Angio Chest/abd/pel For Dissection W And/or W/wo  Result Date: 01/25/2018 CLINICAL DATA:  Chest and back pain.  Evaluate for dissection. EXAM: CT ANGIOGRAPHY CHEST, ABDOMEN AND PELVIS TECHNIQUE: Multidetector CT imaging through the chest, abdomen and pelvis was performed using the standard protocol during bolus administration of intravenous contrast. Multiplanar reconstructed images and MIPs were obtained and reviewed to evaluate the vascular anatomy. CONTRAST:  168mL ISOVUE-370 IOPAMIDOL (ISOVUE-370) INJECTION 76% COMPARISON:  CT a chest 09/20/2012 FINDINGS: CTA CHEST FINDINGS Cardiovascular: Moderate cardiac enlargement with a small to moderate dependent pericardial effusion. 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 There is evidence of periaortic and mediastinal hematoma compatible with contained rupture. The dissection flap does not involve the branches of the aortic arch or branches of the abdominal aorta. The main pulmonary artery appears patent. No central obstructing embolus identified. Mediastinum/Nodes: Normal appearance of the thyroid gland. Decreased AP diameter of the trachea is similar to previous exam and may reflect chronic tracheobronchomalacia. Normal appearance of the esophagus. No enlarged mediastinal or hilar lymph nodes. Lungs/Pleura: Small bilateral pleural effusions. Mild diffuse interstitial prominence identified bilaterally. Subsegmental atelectasis within the dependent lung zones identified. No airspace consolidation. Musculoskeletal: Multi level thoracic spondylosis. Review of the MIP images confirms the above findings. CTA ABDOMEN AND PELVIS FINDINGS VASCULAR Aorta: No evidence for dissection beyond the supra celiac abdominal aorta. No aneurysm. Celiac: Patent without evidence of aneurysm, dissection, vasculitis or significant stenosis. SMA: Patent without evidence of  aneurysm, dissection, vasculitis or significant stenosis. Renals: Both renal arteries are patent without evidence of aneurysm, dissection, vasculitis, fibromuscular dysplasia or significant stenosis. IMA: Patent without evidence of aneurysm, dissection, vasculitis or significant stenosis. Inflow: Patent without evidence of aneurysm, dissection, vasculitis or significant stenosis. Veins: There is mixed attenuation within  the portal venous confluence and tributary vessels to the SMV. This is nonspecific given the arterial phase of opacification in may represent mixing opacified and unopacified blood. Review of the MIP images confirms the above findings. NON-VASCULAR Hepatobiliary: No focal liver abnormality. Stones are noted within the gallbladder measuring up to 8 mm. Pancreas: Unremarkable. No pancreatic ductal dilatation or surrounding inflammatory changes. Spleen: Normal in size without focal abnormality. Adrenals/Urinary Tract: The adrenal glands are normal. Right kidney cyst measures 1.6 cm. Right renal calculus is identified measuring 8 mm. Urinary bladder is unremarkable. Stomach/Bowel: Stomach is within normal limits. Appendix appears normal. No evidence of bowel wall thickening, distention, or inflammatory changes. Circumferential wall thickening involving the sigmoid colon is identified, image 177/5. Nonspecific. Lymphatic: No enlarged lymph nodes within the abdomen or pelvis. Reproductive: Prostate is unremarkable. Other: No free fluid or fluid collections. Musculoskeletal: Degenerative disc disease identified within the lumbar spine. No aggressive lytic or sclerotic bone lesions. Review of the MIP images confirms the above findings. IMPRESSION: 1. Examination is positive for acute type-B aortic dissection from the left subclavian artery to the suprasellar react abdominal aorta. There is no evidence for arch vessel involvement or branch vessel involvement off the abdominal aorta. 2. Thoracic periaortic and  mediastinal hematoma compatible with contained rupture of the aorta. 3. Pericardial effusion. 4. Small pleural effusions. 5. There is circumferential wall thickening involving the sigmoid colon. Etiology indeterminate. Advise correlation with colon cancer screening. 6. Critical Value/emergent results were called by telephone at the time of interpretation on 01/25/2018 at 3:40 pm to Dr. Daleen Bo , who verbally acknowledged these results. Electronically Signed   By: Kerby Moors M.D.   On: 01/25/2018 15:40    Discharge Medications: Allergies as of 01/31/2018   No Known Allergies     Medication List    STOP taking these medications   aspirin EC 81 MG tablet     TAKE these medications   acetaminophen 650 MG CR tablet Commonly known as:  TYLENOL Take 650 mg by mouth every 8 (eight) hours as needed for pain.   alum & mag hydroxide-simeth 200-200-20 MG/5ML suspension Commonly known as:  MAALOX/MYLANTA Take 30 mLs by mouth every 2 (two) hours as needed for indigestion or heartburn.   amiodarone 200 MG tablet Commonly known as:  PACERONE Take 1 tablet (200 mg total) by mouth daily. Start taking on:  02/01/2018   atorvastatin 40 MG tablet Commonly known as:  LIPITOR Take 40 mg by mouth daily at 6 PM.   bisacodyl 5 MG EC tablet Commonly known as:  DULCOLAX Take 10 mg by mouth daily as needed for moderate constipation.   calcium carbonate 500 MG chewable tablet Commonly known as:  TUMS - dosed in mg elemental calcium Chew 1 tablet by mouth 3 (three) times daily.   carbidopa-levodopa 50-200 MG tablet Commonly known as:  SINEMET CR Take 1 tablet by mouth 2 (two) times daily.   cloNIDine 0.2 MG tablet Commonly known as:  CATAPRES Take 1 tablet (0.2 mg total) by mouth 2 (two) times daily. What changed:    medication strength  how much to take  when to take this  reasons to take this   clotrimazole 1 % cream Commonly known as:  LOTRIMIN Apply 1 application topically 2  (two) times daily.   divalproex 125 MG DR tablet Commonly known as:  DEPAKOTE Take 375 mg by mouth 2 (two) times daily.   ergocalciferol 50000 units capsule Commonly known as:  VITAMIN D2  Take 50,000 Units by mouth every 30 (thirty) days. Starting on the 1st and ending on the first every month   fluconazole 200 MG tablet Commonly known as:  DIFLUCAN Take 200 mg by mouth See admin instructions. Take 1 tablet ( 200 mg totally) by mouth in the evening every Mon for fungal infection for 4 weeks   FLUoxetine 20 MG capsule Commonly known as:  PROZAC Take 20 mg by mouth every morning.   ipratropium-albuterol 0.5-2.5 (3) MG/3ML Soln Commonly known as:  DUONEB Take 3 mLs by nebulization every 6 (six) hours as needed (for SOB; wheezing).   levofloxacin 750 MG tablet Commonly known as:  LEVAQUIN Take 750 mg by mouth See admin instructions. Take 1 tablet (750 mg totally) by mouth at bedtime for infection in 3 days   lisinopril 40 MG tablet Commonly known as:  PRINIVIL,ZESTRIL Take 1 tablet (40 mg total) by mouth daily. Start taking on:  02/01/2018 What changed:    medication strength  how much to take   magnesium hydroxide 400 MG/5ML suspension Commonly known as:  MILK OF MAGNESIA Take 30 mLs by mouth daily as needed for mild constipation.   metoprolol tartrate 50 MG tablet Commonly known as:  LOPRESSOR Take 50 mg by mouth 2 (two) times daily.   OLANZapine 15 MG tablet Commonly known as:  ZYPREXA Take 15 mg by mouth at bedtime.   ondansetron 4 MG tablet Commonly known as:  ZOFRAN Take 4 mg by mouth every 4 (four) hours as needed for nausea or vomiting.   predniSONE 20 MG tablet Commonly known as:  DELTASONE Take 40 mg by mouth at bedtime as needed (for SOB in 4 days).       Follow Up Appointments:  Contact information for follow-up providers    Ivin Poot, MD. Go on 03/05/2018.   Specialty:  Cardiothoracic Surgery Why:  Appointment time is at 2:30.Marland Kitchen He will need  to have a CTA of the chest prior to his appointment they will contact with appointment date and time Contact information: [place] [address] [phone]            Contact information for after-discharge care    Destination    [facility] .   Service:  Skilled Nursing Contact information: [place] [phone]                  Signed: Ellamae Sia 01/31/2018, 10:09 AM

## 2018-01-28 ENCOUNTER — Inpatient Hospital Stay (HOSPITAL_COMMUNITY): Payer: Medicare Other

## 2018-01-28 LAB — GLUCOSE, CAPILLARY
GLUCOSE-CAPILLARY: 101 mg/dL — AB (ref 65–99)
Glucose-Capillary: 109 mg/dL — ABNORMAL HIGH (ref 65–99)
Glucose-Capillary: 118 mg/dL — ABNORMAL HIGH (ref 65–99)
Glucose-Capillary: 98 mg/dL (ref 65–99)

## 2018-01-28 LAB — COMPREHENSIVE METABOLIC PANEL
ALT: 7 U/L — ABNORMAL LOW (ref 17–63)
AST: 14 U/L — ABNORMAL LOW (ref 15–41)
Albumin: 2.4 g/dL — ABNORMAL LOW (ref 3.5–5.0)
Alkaline Phosphatase: 52 U/L (ref 38–126)
Anion gap: 10 (ref 5–15)
BUN: 11 mg/dL (ref 6–20)
CO2: 24 mmol/L (ref 22–32)
Calcium: 8.6 mg/dL — ABNORMAL LOW (ref 8.9–10.3)
Chloride: 106 mmol/L (ref 101–111)
Creatinine, Ser: 0.8 mg/dL (ref 0.61–1.24)
GFR calc Af Amer: >60 mL/min (ref >60)
GFR calc non Af Amer: >60 mL/min (ref >60)
Glucose, Bld: 103 mg/dL — ABNORMAL HIGH (ref 65–99)
Potassium: 4 mmol/L (ref 3.5–5.1)
Sodium: 140 mmol/L (ref 135–145)
Total Bilirubin: 0.8 mg/dL (ref 0.3–1.2)
Total Protein: 5.1 g/dL — ABNORMAL LOW (ref 6.5–8.1)

## 2018-01-28 LAB — CBC
HCT: 41.3 % (ref 39.0–52.0)
Hemoglobin: 12.9 g/dL — ABNORMAL LOW (ref 13.0–17.0)
MCH: 27.4 pg (ref 26.0–34.0)
MCHC: 31.2 g/dL (ref 30.0–36.0)
MCV: 87.9 fL (ref 78.0–100.0)
Platelets: 122 10*3/uL — ABNORMAL LOW (ref 150–400)
RBC: 4.7 MIL/uL (ref 4.22–5.81)
RDW: 15.4 % (ref 11.5–15.5)
WBC: 10.5 10*3/uL (ref 4.0–10.5)

## 2018-01-28 MED ORDER — LISINOPRIL 20 MG PO TABS
20.0000 mg | ORAL_TABLET | Freq: Every day | ORAL | Status: DC
  Administered 2018-01-28: 20 mg via ORAL
  Filled 2018-01-28: qty 1

## 2018-01-28 NOTE — Progress Notes (Addendum)
      [address]       ,[place] [address]             [phone]       Subjective:  Patient complains of back and chest pain this morning.  He is somnolent, but does arouse when spoken to.  Objective: Vital signs in last 24 hours: Temp:  [97.4 F (36.3 C)-98.9 F (37.2 C)] 98 F (36.7 C) (04/02 0700) Pulse Rate:  [66-139] 74 (04/02 0700) Cardiac Rhythm: Atrial fibrillation (04/02 0701) Resp:  [18-35] 21 (04/02 0700) BP: (99-149)/(59-102) 145/101 (04/02 0700) SpO2:  [93 %-100 %] 96 % (04/02 0700)  Intake/Output from previous day: 04/01 0701 - 04/02 0700 In: 2005 [P.O.:1425; I.V.:580] Out: 1750 [Urine:1750]  General appearance: cooperative and no distress Heart: irregularly irregular rhythm Lungs: clear to auscultation bilaterally Abdomen: soft, non-tender; bowel sounds normal; no masses,  no organomegaly Extremities: extremities normal, atraumatic, no cyanosis or edema  Lab Results: Recent Labs    01/27/18 0237 01/28/18 0235  WBC 9.8 10.5  HGB 13.6 12.9*  HCT 42.7 41.3  PLT 120* 122*   BMET:  Recent Labs    01/27/18 0237 01/28/18 0235  NA 140 140  K 4.0 4.0  CL 105 106  CO2 25 24  GLUCOSE 111* 103*  BUN 17 11  CREATININE 1.00 0.80  CALCIUM 8.4* 8.6*    PT/INR:  Recent Labs    01/26/18 0341  LABPROT 15.1  INR 1.20   ABG    Component Value Date/Time   PHART 7.361 05/18/2013 1305   HCO3 29.7 (H) 05/18/2013 1305   TCO2 29 01/25/2018 1359   O2SAT 98.2 05/18/2013 1305   CBG (last 3)  Recent Labs    01/27/18 1731 01/27/18 2111 01/28/18 0753  GLUCAP 96 106* 101*    Assessment/Plan:  1. CV- Atrial Fibrillation, rate controlled, remains hypertensive this morning with SBP in the 140s- continue Amiodarone, Lopressor, Clonidine, will increase lisinopril to 20 mg daily 2. Neuro- Parkinsons, and Dementia- on Sinemet 3. Deconditioning- patient is non-ambulatory, lives in nursing home, will plan to d/c back there once  appropriate 4. Dispo- patient remains in rate controlled A. Fib he would not be a candidate for anticoagulation, remains hypertensive will increase dose of Lisinopril, continue current care, repeat CTA later this week   LOS: 3 days    Ellwood Handler 01/28/2018  Agree with above assessment and plan. Patient examined and chest x-ray image reviewed. Patient is recovering from descending thoracic aortic dissection with medical therapy Blood pressure getting under control-medicines being titrated We will check CTA of thoracic aorta later this week Patient's pain is generally improved.  Dahlia Byes MD

## 2018-01-29 LAB — GLUCOSE, CAPILLARY
GLUCOSE-CAPILLARY: 110 mg/dL — AB (ref 65–99)
GLUCOSE-CAPILLARY: 107 mg/dL — AB (ref 65–99)
GLUCOSE-CAPILLARY: 89 mg/dL (ref 65–99)
Glucose-Capillary: 83 mg/dL (ref 65–99)

## 2018-01-29 MED ORDER — LISINOPRIL 20 MG PO TABS
40.0000 mg | ORAL_TABLET | Freq: Every day | ORAL | Status: DC
  Administered 2018-01-29 – 2018-01-31 (×3): 40 mg via ORAL
  Filled 2018-01-29 (×3): qty 2

## 2018-01-29 NOTE — Progress Notes (Addendum)
      [address]       [place] [address]             [phone]      Subjective:  No complaints.  Denies chest and back pain today.  Patient is non-ambulatory.  No BM  Objective: Vital signs in last 24 hours: Temp:  [97 F (36.1 C)-99.7 F (37.6 C)] 98.2 F (36.8 C) (04/03 0800) Pulse Rate:  [79-98] 79 (04/03 0800) Cardiac Rhythm: Atrial fibrillation (04/03 0700) Resp:  [25-35] 25 (04/03 0800) BP: (128-155)/(86-104) 128/87 (04/03 0800) SpO2:  [90 %-100 %] 98 % (04/03 0800) FiO2 (%):  [3 %] 3 % (04/02 2313) Weight:  [236 lb 1.8 oz (107.1 kg)] 236 lb 1.8 oz (107.1 kg) (04/03 0500)  Intake/Output from previous day: 04/02 0701 - 04/03 0700 In: 1170 [P.O.:1160] Out: 3100 [Urine:3100]  General appearance: alert, cooperative and no distress Heart: irregularly irregular rhythm Lungs: clear to auscultation bilaterally Abdomen: soft, non-tender; bowel sounds normal; no masses,  no organomegaly Extremities: extremities normal, atraumatic, no cyanosis or edema Wound: clean and dry  Lab Results: Recent Labs    01/27/18 0237 01/28/18 0235  WBC 9.8 10.5  HGB 13.6 12.9*  HCT 42.7 41.3  PLT 120* 122*   BMET:  Recent Labs    01/27/18 0237 01/28/18 0235  NA 140 140  K 4.0 4.0  CL 105 106  CO2 25 24  GLUCOSE 111* 103*  BUN 17 11  CREATININE 1.00 0.80  CALCIUM 8.4* 8.6*    PT/INR: No results for input(s): LABPROT, INR in the last 72 hours. ABG    Component Value Date/Time   PHART 7.361 05/18/2013 1305   HCO3 29.7 (H) 05/18/2013 1305   TCO2 29 01/25/2018 1359   O2SAT 98.2 05/18/2013 1305   CBG (last 3)  Recent Labs    01/28/18 1639 01/28/18 2128 01/29/18 0757  GLUCAP 118* 98 83    Assessment/Plan:  1. CV- Atrial Fibrillation, rate controlled, BP is improved, now the 120s- will continue Amiodarone, Lopressor, Clonidine, and will increase Lisinopril to 40 mg daily for further BP control 2. Neuro- Parkinsons, Dementia- on Sinemet 3.  Deconditioning- lives in nursing home, plan will be to return there once stable 4. Dispo- patient without chest/back pain today, further titrate lisinopril for better BP control- will get repeat CTA chest tomorrow, if remains clinically stable may be able to return to nursing home on Friday  LOS: 4 days    Ellwood Handler 01/29/2018 patient examined and medical record reviewed,agree with above note. Tharon Aquas Trigt III 01/30/2018

## 2018-01-30 ENCOUNTER — Inpatient Hospital Stay (HOSPITAL_COMMUNITY): Payer: Medicare Other

## 2018-01-30 LAB — GLUCOSE, CAPILLARY
Glucose-Capillary: 71 mg/dL (ref 65–99)
Glucose-Capillary: 102 mg/dL — ABNORMAL HIGH (ref 65–99)
Glucose-Capillary: 94 mg/dL (ref 65–99)
Glucose-Capillary: 96 mg/dL (ref 65–99)

## 2018-01-30 MED ORDER — IOPAMIDOL (ISOVUE-370) INJECTION 76%
INTRAVENOUS | Status: AC
  Filled 2018-01-30: qty 100

## 2018-01-30 MED ORDER — IOPAMIDOL (ISOVUE-370) INJECTION 76%
100.0000 mL | Freq: Once | INTRAVENOUS | Status: AC | PRN
  Administered 2018-01-30: 100 mL via INTRAVENOUS

## 2018-01-30 MED ORDER — CLONIDINE HCL 0.2 MG PO TABS
0.2000 mg | ORAL_TABLET | Freq: Two times a day (BID) | ORAL | Status: DC
  Administered 2018-01-30 – 2018-01-31 (×3): 0.2 mg via ORAL
  Filled 2018-01-30 (×3): qty 1

## 2018-01-30 MED ORDER — WHITE PETROLATUM EX OINT
TOPICAL_OINTMENT | CUTANEOUS | Status: AC
  Administered 2018-01-30: 22:00:00
  Filled 2018-01-30: qty 28.35

## 2018-01-30 NOTE — Care Management Important Message (Signed)
Important Message  Patient Details  Name: Todd Page MRN: [id number] Date of Birth: Dec 17, 1950   Medicare Important Message Given:  Yes    [name] 01/30/2018, 3:44 PM

## 2018-01-30 NOTE — Clinical Social Work Note (Signed)
Clinical Social Work Assessment  Patient Details  Name: Todd Page MRN: [id number] Date of Birth: May 10, 1951  Date of referral:  01/30/18               Reason for consult:  Discharge Planning                Permission sought to share information with:  Chartered certified accountant granted to share information::  Yes, Verbal Permission Granted  Name::        Agency::  [facility]  Relationship::     Contact Information:     Housing/Transportation Living arrangements for the past 2 months:  [place] of Information:  Patient, Medical Team Patient Interpreter Needed:  None Criminal Activity/Legal Involvement Pertinent to Current Situation/Hospitalization:  No - Comment as needed Significant Relationships:  Siblings Lives with:  Facility Resident Do you feel safe going back to the place where you live?  Yes Need for family participation in patient care:  Yes (Comment)  Care giving concerns:  Patient is a long-term resident at [facility].   Social Worker assessment / plan:  CSW met with patient. No supports at bedside. CSW introduced role and explained that discharge planning would be discussed. Patient confirmed he is a long-term resident at [facility]. He has been there for one year and plans to return tomorrow. CSW left voicemail for admissions coordinator. No further concerns. CSW encouraged patient to contact CSW as needed. CSW will continue to follow patient for support and facilitate discharge to SNF once medically stable.  Employment status:  Retired Nurse, adult PT Recommendations:  [place] / Referral to community resources:  [place]  Patient/Family's Response to care:  Patient agreeable to returning to SNF. Patient's brother supportive and involved in patient's care. Patient appreciated social work intervention.  Patient/Family's Understanding of  and Emotional Response to Diagnosis, Current Treatment, and Prognosis:  Patient has a good understanding of the reason for admission and plan to return to SNF once medically stable for discharge. Patient appears happy with hospital care.  Emotional Assessment Appearance:  Appears stated age Attitude/Demeanor/Rapport:  Engaged, Gracious Affect (typically observed):  Accepting, Appropriate, Calm, Pleasant Orientation:  Oriented to Self, Oriented to Place, Oriented to  Time, Oriented to Situation Alcohol / Substance use:  Never Used Psych involvement (Current and /or in the community):  No (Comment)  Discharge Needs  Concerns to be addressed:  Care Coordination Readmission within the last 30 days:  No Current discharge risk:  None Barriers to Discharge:  Continued Medical Work up   Candie Chroman, LCSW 01/30/2018, 12:10 PM

## 2018-01-30 NOTE — NC FL2 (Signed)
[place] LEVEL OF CARE SCREENING TOOL     IDENTIFICATION  Patient Name: Todd Page Birthdate: 05/22/1951 Sex: male Admission Date (Current Location): 01/25/2018  [facility] and [place] Number:  Herbalist and Address:  [facility], [place] [address] [place], [place] [address]      Provider Number: [id number]  Attending Physician Name and Address:  Ivin Poot, MD  Relative Name and Phone Number:       Current Level of Care: Hospital Recommended Level of Care: [place] Prior Approval Number:    Date Approved/Denied:   PASRR Number: [id number]  Discharge Plan: SNF    Current Diagnoses: Patient Active Problem List   Diagnosis Date Noted  . 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[employer]) 01/25/2018  . Hyperglycemia 03/19/2014  . CHF (congestive heart failure) ([place]) 03/19/2014  . Acute exacerbation of congestive heart failure ([place]) 03/18/2014  . Acute on chronic heart failure ([place]) 03/18/2014  . Chest pain 06/08/2013  . Tachypnea 05/25/2013  . Acute diastolic congestive heart failure ([place]) 05/25/2013  . Chronic kidney disease, stage III (moderate) ([place]) 05/25/2013  . Acute gout 05/25/2013  . Elevated troponin 05/18/2013  . Acute respiratory failure ([place]) 02/20/2013  . Lower extremity edema 02/17/2013  . Acute on chronic systolic HF (heart failure) ([place]) 12/25/2012  . Atrial fibrillation with rapid ventricular response ([place]) 12/24/2012  . Malignant hypertension 12/24/2012  . Dyspnea 12/24/2012  . Bilateral leg pain 12/24/2012  . Hypokalemia 12/24/2012  . Noncompliance 09/21/2012  . Hypertension 09/21/2012  . Atrial fibrillation ([place]) 07/26/2012  . Obesity 07/26/2012  . Schizoaffective disorder ([place]) 07/26/2012    Orientation RESPIRATION BLADDER Height & Weight     Self, Time, Situation, Place  O2(Nasal Canula 3 L) Incontinent, External catheter Weight: 236 lb 1.8 oz (107.1 kg) Height:  5\' 11"   (180.3 cm)  BEHAVIORAL SYMPTOMS/MOOD NEUROLOGICAL BOWEL NUTRITION STATUS  (None) (None) Continent Diet(Heart healthy)  AMBULATORY STATUS COMMUNICATION OF NEEDS Skin   Total Care Verbally Bruising, Other (Comment)(MASD, Rash.)                       Personal Care Assistance Level of Assistance              Functional Limitations Info  Sight, Hearing, Speech Sight Info: Adequate Hearing Info: Adequate Speech Info: Adequate    SPECIAL CARE FACTORS FREQUENCY  PT (By licensed PT)     PT Frequency: 5 x week              Contractures Contractures Info: Not present    Additional Factors Info  Code Status, Allergies Code Status Info: Full Allergies Info: NKDA           Current Medications (01/30/2018):  This is the current hospital active medication list Current Facility-Administered Medications  Medication Dose Route Frequency Provider Last Rate Last Dose  . 0.9 %  sodium chloride infusion   Intra-arterial PRN Duffy Bruce, MD      . 0.9 %  sodium chloride infusion   Intravenous Continuous Ivin Poot, MD   Stopped at 01/27/18 0901  . acetaminophen (TYLENOL) tablet 650 mg  650 mg Oral Q6H PRN Ivin Poot, MD   650 mg at 01/27/18 2015   Or  . acetaminophen (TYLENOL) suppository 650 mg  650 mg Rectal Q6H PRN Prescott Gum, Collier Salina, MD      . albuterol (PROVENTIL) (2.5 MG/3ML) 0.083% nebulizer solution 2.5  mg  2.5 mg Nebulization Q4H PRN Ivin Poot, MD   2.5 mg at 01/28/18 0424  . amiodarone (PACERONE) tablet 200 mg  200 mg Oral Daily Prescott Gum, Collier Salina, MD   200 mg at 01/30/18 4765  . atorvastatin (LIPITOR) tablet 40 mg  40 mg Oral q1800 Prescott Gum, Collier Salina, MD   40 mg at 01/29/18 1816  . carbidopa-levodopa (SINEMET CR) 50-200 MG per tablet controlled release 1 tablet  1 tablet Oral BID WC Ivin Poot, MD   1 tablet at 01/30/18 [phone]  . cloNIDine (CATAPRES) tablet 0.2 mg  0.2 mg Oral BID Prescott Gum, Collier Salina, MD   0.2 mg at 01/30/18 0917  . divalproex (DEPAKOTE)  DR tablet 375 mg  375 mg Oral BID Ivin Poot, MD   375 mg at 01/30/18 3546  . docusate sodium (COLACE) capsule 100 mg  100 mg Oral BID Prescott Gum, Collier Salina, MD   100 mg at 01/30/18 0917  . fentaNYL (SUBLIMAZE) injection 50 mcg  50 mcg Intravenous Q4H PRN Prescott Gum, Collier Salina, MD      . FLUoxetine (PROZAC) capsule 20 mg  20 mg Oral Daily Prescott Gum, Collier Salina, MD   20 mg at 01/30/18 [phone]  . insulin aspart (novoLOG) injection 0-15 Units  0-15 Units Subcutaneous TID WC Ivin Poot, MD   2 Units at 01/26/18 1756  . iopamidol (ISOVUE-370) 76 % injection           . lisinopril (PRINIVIL,ZESTRIL) tablet 40 mg  40 mg Oral Daily Barrett, Erin R, PA-C   40 mg at 01/30/18 0917  . MEDLINE mouth rinse  15 mL Mouth Rinse BID Ivin Poot, MD   15 mL at 01/30/18 0919  . metoprolol tartrate (LOPRESSOR) tablet 50 mg  50 mg Oral BID Prescott Gum, Collier Salina, MD   50 mg at 01/30/18 2751  . OLANZapine (ZYPREXA) tablet 15 mg  15 mg Oral QHS Ivin Poot, MD   15 mg at 01/29/18 2145  . ondansetron (ZOFRAN) injection 4 mg  4 mg Intravenous Once Prescott Gum, Collier Salina, MD      . sorbitol 70 % solution 30 mL  30 mL Oral Daily PRN Prescott Gum, Collier Salina, MD   30 mL at 01/29/18 0856  . thiamine (VITAMIN B-1) tablet 100 mg  100 mg Oral Daily Prescott Gum, Collier Salina, MD   100 mg at 01/30/18 0917  . traMADol (ULTRAM) tablet 50 mg  50 mg Oral Q6H PRN Ivin Poot, MD         Discharge Medications: Please see discharge summary for a list of discharge medications.  Relevant Imaging Results:  Relevant Lab Results:   Additional Information SS#: [id number]  Candie Chroman, LCSW

## 2018-01-30 NOTE — Progress Notes (Addendum)
      [address]       [place] [address]             [phone]      Subjective:  States feels fair today.  Has some back pain.  Non-ambulatory  Objective: Vital signs in last 24 hours: Temp:  [97.4 F (36.3 C)-98.7 F (37.1 C)] 97.8 F (36.6 C) (04/03 2355) Pulse Rate:  [73-134] 94 (04/04 0311) Cardiac Rhythm: Atrial fibrillation (04/03 2355) Resp:  [25-35] 25 (04/04 0311) BP: (103-158)/(67-93) 158/73 (04/04 0311) SpO2:  [96 %-100 %] 99 % (04/04 0311) Weight:  [236 lb 1.8 oz (107.1 kg)] 236 lb 1.8 oz (107.1 kg) (04/04 0500)  Intake/Output from previous day: 04/03 0701 - 04/04 0700 In: 960 [P.O.:960] Out: 1900 [Urine:1900]  General appearance: alert, cooperative and no distress Heart: regular rate and rhythm Lungs: clear to auscultation bilaterally Abdomen: soft, non-tender; bowel sounds normal; no masses,  no organomegaly Extremities: extremities normal, atraumatic, no cyanosis or edema + pulses   Lab Results: Recent Labs    01/28/18 0235  WBC 10.5  HGB 12.9*  HCT 41.3  PLT 122*   BMET:  Recent Labs    01/28/18 0235  NA 140  K 4.0  CL 106  CO2 24  GLUCOSE 103*  BUN 11  CREATININE 0.80  CALCIUM 8.6*    PT/INR: No results for input(s): LABPROT, INR in the last 72 hours. ABG    Component Value Date/Time   PHART 7.361 05/18/2013 1305   HCO3 29.7 (H) 05/18/2013 1305   TCO2 29 01/25/2018 1359   O2SAT 98.2 05/18/2013 1305   CBG (last 3)  Recent Labs    01/29/18 1148 01/29/18 1643 01/29/18 2128  GLUCAP 110* 107* 89    Assessment/Plan:  1. Type B Aortic Dissection- pain is relieved for the most part- repeat CTA of the chest today 2. CV- A. Fib, rate controlled- BP is in the 150s early this morning, continue Amiodarone, Lopressor, Lisinopril,clonidine patch was discontinued yesterday and he was transitioned to oral regimen of 0.2 mg BID... Will monitor 3. Deconditioning- lives in nursing home and is non-ambulatory 4. Dispo-  CTA chest today, watch BP as it has increased now that clonidine patch has been transitioned to oral clonidine, will discuss management with Dr. Prescott Gum   LOS: 5 days    [place] Handler 01/30/2018  Patient examined and CTA of thoracic aorta images personally reviewed The type B dissection shows the false lumen to be mainly thrombosed with some faint contrast still in the upper chest.  There is some atelectasis at the bases but no evidence of rupture.  The ascending aorta is not involved and there is no retrograde extension  The patient's blood pressure is fairly well controlled.  He was placed back on his preoperative dose of oral clonidine which apparently was given prn at the rest home.  The patient is still having some pain a.  nd should remain in the hospital through the weekend  Absolutely no anticoagulation.  Dahlia Byes MD

## 2018-01-31 ENCOUNTER — Other Ambulatory Visit: Payer: Self-pay | Admitting: *Deleted

## 2018-01-31 DIAGNOSIS — I7101 Dissection of thoracic aorta: Secondary | ICD-10-CM

## 2018-01-31 DIAGNOSIS — I71019 Dissection of thoracic aorta, unspecified: Secondary | ICD-10-CM

## 2018-01-31 LAB — BASIC METABOLIC PANEL
Anion gap: 9 (ref 5–15)
BUN: 16 mg/dL (ref 6–20)
CO2: 26 mmol/L (ref 22–32)
Calcium: 8.5 mg/dL — ABNORMAL LOW (ref 8.9–10.3)
Chloride: 103 mmol/L (ref 101–111)
Creatinine, Ser: 0.86 mg/dL (ref 0.61–1.24)
GFR calc Af Amer: >60 mL/min (ref >60)
GFR calc non Af Amer: >60 mL/min (ref >60)
Glucose, Bld: 85 mg/dL (ref 65–99)
Potassium: 4.1 mmol/L (ref 3.5–5.1)
Sodium: 138 mmol/L (ref 135–145)

## 2018-01-31 LAB — CBC
HCT: 37.8 % — ABNORMAL LOW (ref 39.0–52.0)
Hemoglobin: 11.8 g/dL — ABNORMAL LOW (ref 13.0–17.0)
MCH: 27.2 pg (ref 26.0–34.0)
MCHC: 31.2 g/dL (ref 30.0–36.0)
MCV: 87.1 fL (ref 78.0–100.0)
Platelets: 158 10*3/uL (ref 150–400)
RBC: 4.34 MIL/uL (ref 4.22–5.81)
RDW: 14.8 % (ref 11.5–15.5)
WBC: 10.7 10*3/uL — ABNORMAL HIGH (ref 4.0–10.5)

## 2018-01-31 LAB — GLUCOSE, CAPILLARY
GLUCOSE-CAPILLARY: 133 mg/dL — AB (ref 65–99)
Glucose-Capillary: 88 mg/dL (ref 65–99)

## 2018-01-31 MED ORDER — AMIODARONE HCL 200 MG PO TABS: 200.0000 mg | ORAL_TABLET | Freq: Every day | ORAL | Status: DC

## 2018-01-31 MED ORDER — LISINOPRIL 40 MG PO TABS: 40.0000 mg | ORAL_TABLET | Freq: Every day | ORAL | Status: DC

## 2018-01-31 MED ORDER — CLONIDINE HCL 0.2 MG PO TABS: 0.2000 mg | ORAL_TABLET | Freq: Two times a day (BID) | ORAL | Status: DC

## 2018-01-31 NOTE — Progress Notes (Signed)
      [address]       [place], [address]             [phone]      Subjective:  Patient without complaints this morning.  He denies chest and back pain.  Objective: Vital signs in last 24 hours: Temp:  [97.4 F (36.3 C)-98.4 F (36.9 C)] 97.4 F (36.3 C) (04/05 0318) Pulse Rate:  [81-111] 93 (04/05 0318) Cardiac Rhythm: Atrial fibrillation (04/05 0700) Resp:  [26-30] 28 (04/05 0318) BP: (128-151)/(76-99) 143/99 (04/05 0318) SpO2:  [97 %-100 %] 99 % (04/05 0318) Weight:  [234 lb 12.6 oz (106.5 kg)] 234 lb 12.6 oz (106.5 kg) (04/05 0318)  Intake/Output from previous day: 04/04 0701 - 04/05 0700 In: 1080 [P.O.:1080] Out: -   General appearance: alert, cooperative and no distress Heart: irregularly irregular rhythm Lungs: diminished breath sounds bibasilar Abdomen: soft, non-tender; bowel sounds normal; no masses,  no organomegaly Extremities: extremities normal, atraumatic, no cyanosis or edema and +pulses  Lab Results: Recent Labs    01/31/18 0304  WBC 10.7*  HGB 11.8*  HCT 37.8*  PLT 158   BMET:  Recent Labs    01/31/18 0304  NA 138  K 4.1  CL 103  CO2 26  GLUCOSE 85  BUN 16  CREATININE 0.86  CALCIUM 8.5*    PT/INR: No results for input(s): LABPROT, INR in the last 72 hours. ABG    Component Value Date/Time   PHART 7.361 05/18/2013 1305   HCO3 29.7 (H) 05/18/2013 1305   TCO2 29 01/25/2018 1359   O2SAT 98.2 05/18/2013 1305   CBG (last 3)  Recent Labs    01/30/18 1229 01/30/18 1738 01/30/18 2119  GLUCAP 102* 94 96    Assessment/Plan:  1. Type B Aortic Dissection- stable per CT scan 2. CV- A. Fib, BP ran 130-140s all day yesterday- on Amiodarone, Lopressor, Lisinopril, and Clonidine 3. Deconditioning- patient is non-ambulatory, he lives at [facility] 4. Dispo- patient stable, pain free today, continue BP medications, continue current care   LOS: 6 days    [place] Handler 01/31/2018

## 2018-01-31 NOTE — Progress Notes (Signed)
Pt discharged back to [facility] per MD order, report called to receiving nurse and all questions answered.

## 2018-01-31 NOTE — Clinical Social Work Note (Signed)
CSW left voicemail for SNF admissions coordinator. Asked her to call back with time transport can be arranged.  Dayton Scrape, [place]

## 2018-01-31 NOTE — Clinical Social Work Note (Signed)
CSW facilitated patient discharge including contacting facility to confirm patient discharge plans. Patient declined to have CSW call family. Clinical information faxed to facility and family agreeable with plan. CSW arranged ambulance transport via [facility] to [facility]. RN to call report prior to discharge [phone] Ask for 200 hall RN. Will go to room 205B).  CSW will sign off for now as social work intervention is no longer needed. Please consult [place] again if new needs arise.  Dayton Scrape, [place]

## 2018-02-05 ENCOUNTER — Observation Stay (HOSPITAL_COMMUNITY)
Admission: EM | Admit: 2018-02-05 | Discharge: 2018-02-06 | Disposition: A | Discharge status: Home / Self Care | Payer: Medicare Other | Attending: Internal Medicine | Admitting: Internal Medicine

## 2018-02-05 ENCOUNTER — Other Ambulatory Visit: Payer: Self-pay

## 2018-02-05 ENCOUNTER — Telehealth: Payer: Self-pay | Admitting: *Deleted

## 2018-02-05 ENCOUNTER — Encounter (HOSPITAL_COMMUNITY): Payer: Self-pay

## 2018-02-05 ENCOUNTER — Emergency Department (HOSPITAL_COMMUNITY): Payer: Medicare Other

## 2018-02-05 DIAGNOSIS — R52 Pain, unspecified: Secondary | ICD-10-CM | POA: Insufficient documentation

## 2018-02-05 DIAGNOSIS — E785 Hyperlipidemia, unspecified: Secondary | ICD-10-CM | POA: Diagnosis present

## 2018-02-05 DIAGNOSIS — F259 Schizoaffective disorder, unspecified: Secondary | ICD-10-CM | POA: Diagnosis present

## 2018-02-05 DIAGNOSIS — I130 Hypertensive heart and chronic kidney disease with heart failure and stage 1 through stage 4 chronic kidney disease, or unspecified chronic kidney disease: Secondary | ICD-10-CM | POA: Diagnosis not present

## 2018-02-05 DIAGNOSIS — I169 Hypertensive crisis, unspecified: Secondary | ICD-10-CM | POA: Diagnosis present

## 2018-02-05 DIAGNOSIS — I5022 Chronic systolic (congestive) heart failure: Secondary | ICD-10-CM | POA: Diagnosis not present

## 2018-02-05 DIAGNOSIS — G20 Parkinson's disease: Secondary | ICD-10-CM | POA: Diagnosis present

## 2018-02-05 DIAGNOSIS — I10 Essential (primary) hypertension: Secondary | ICD-10-CM | POA: Diagnosis present

## 2018-02-05 DIAGNOSIS — I4891 Unspecified atrial fibrillation: Secondary | ICD-10-CM | POA: Diagnosis present

## 2018-02-05 DIAGNOSIS — I5032 Chronic diastolic (congestive) heart failure: Secondary | ICD-10-CM | POA: Diagnosis present

## 2018-02-05 DIAGNOSIS — N183 Chronic kidney disease, stage 3 (moderate): Secondary | ICD-10-CM | POA: Insufficient documentation

## 2018-02-05 DIAGNOSIS — I71019 Dissection of thoracic aorta, unspecified: Secondary | ICD-10-CM | POA: Diagnosis present

## 2018-02-05 DIAGNOSIS — I160 Hypertensive urgency: Secondary | ICD-10-CM | POA: Diagnosis not present

## 2018-02-05 DIAGNOSIS — Z79899 Other long term (current) drug therapy: Secondary | ICD-10-CM | POA: Insufficient documentation

## 2018-02-05 DIAGNOSIS — R531 Weakness: Secondary | ICD-10-CM | POA: Insufficient documentation

## 2018-02-05 DIAGNOSIS — R42 Dizziness and giddiness: Secondary | ICD-10-CM | POA: Diagnosis not present

## 2018-02-05 DIAGNOSIS — I7101 Dissection of thoracic aorta: Secondary | ICD-10-CM | POA: Diagnosis present

## 2018-02-05 HISTORY — DX: Thoracic aortic aneurysm, without rupture, unspecified: I71.20

## 2018-02-05 HISTORY — DX: Thoracic aortic aneurysm, without rupture: I71.2

## 2018-02-05 LAB — TROPONIN I: Troponin I: <0.03 ng/mL (ref <0.03)

## 2018-02-05 LAB — COMPREHENSIVE METABOLIC PANEL
ALBUMIN: 2.8 g/dL — AB (ref 3.5–5.0)
ALT: 11 U/L — AB (ref 17–63)
AST: 15 U/L (ref 15–41)
Alkaline Phosphatase: 70 U/L (ref 38–126)
Anion gap: 14 (ref 5–15)
BUN: 20 mg/dL (ref 6–20)
CO2: 29 mmol/L (ref 22–32)
Calcium: 9.5 mg/dL (ref 8.9–10.3)
Chloride: 100 mmol/L — ABNORMAL LOW (ref 101–111)
Creatinine, Ser: 0.97 mg/dL (ref 0.61–1.24)
GFR calc non Af Amer: >60 mL/min (ref >60)
Glucose, Bld: 125 mg/dL — ABNORMAL HIGH (ref 65–99)
Potassium: 3.6 mmol/L (ref 3.5–5.1)
SODIUM: 143 mmol/L (ref 135–145)
Total Bilirubin: 0.6 mg/dL (ref 0.3–1.2)
Total Protein: 6.6 g/dL (ref 6.5–8.1)

## 2018-02-05 LAB — CBC WITH DIFFERENTIAL/PLATELET
Basophils Absolute: 0 10*3/uL (ref 0.0–0.1)
Basophils Relative: 0 %
Eosinophils Absolute: 0.1 10*3/uL (ref 0.0–0.7)
Eosinophils Relative: 1 %
HEMATOCRIT: 44.6 % (ref 39.0–52.0)
HEMOGLOBIN: 14.2 g/dL (ref 13.0–17.0)
Lymphocytes Relative: 21 %
Lymphs Abs: 1.9 10*3/uL (ref 0.7–4.0)
MCH: 27.8 pg (ref 26.0–34.0)
MCHC: 31.8 g/dL (ref 30.0–36.0)
MCV: 87.5 fL (ref 78.0–100.0)
MONOS PCT: 10 %
Monocytes Absolute: 0.9 10*3/uL (ref 0.1–1.0)
NEUTROS ABS: 6.1 10*3/uL (ref 1.7–7.7)
NEUTROS PCT: 68 %
Platelets: 213 10*3/uL (ref 150–400)
RBC: 5.1 MIL/uL (ref 4.22–5.81)
RDW: 14.2 % (ref 11.5–15.5)
WBC: 8.9 10*3/uL (ref 4.0–10.5)

## 2018-02-05 LAB — BRAIN NATRIURETIC PEPTIDE: B Natriuretic Peptide: 244 pg/mL — ABNORMAL HIGH (ref 0.0–100.0)

## 2018-02-05 MED ORDER — CLOTRIMAZOLE 1 % EX CREA
1.0000 "application " | TOPICAL_CREAM | Freq: Two times a day (BID) | CUTANEOUS | Status: DC
  Administered 2018-02-06: 1 via TOPICAL
  Filled 2018-02-05: qty 15

## 2018-02-05 MED ORDER — HYDRALAZINE HCL 20 MG/ML IJ SOLN
10.0000 mg | INTRAMUSCULAR | Status: DC | PRN
  Administered 2018-02-06: 10 mg via INTRAVENOUS
  Filled 2018-02-05: qty 1

## 2018-02-05 MED ORDER — BISACODYL 5 MG PO TBEC
10.0000 mg | DELAYED_RELEASE_TABLET | Freq: Every morning | ORAL | Status: DC
  Administered 2018-02-06: 10 mg via ORAL
  Filled 2018-02-05: qty 2

## 2018-02-05 MED ORDER — IPRATROPIUM-ALBUTEROL 0.5-2.5 (3) MG/3ML IN SOLN: 3.0000 mL | Freq: Four times a day (QID) | RESPIRATORY_TRACT | Status: DC | PRN

## 2018-02-05 MED ORDER — OLANZAPINE 5 MG PO TABS
15.0000 mg | ORAL_TABLET | Freq: Every day | ORAL | Status: DC
  Administered 2018-02-06: 15 mg via ORAL
  Filled 2018-02-05: qty 3

## 2018-02-05 MED ORDER — ONDANSETRON HCL 4 MG PO TABS: 4.0000 mg | ORAL_TABLET | Freq: Four times a day (QID) | ORAL | Status: DC | PRN

## 2018-02-05 MED ORDER — ONDANSETRON HCL 4 MG/2ML IJ SOLN: 4.0000 mg | Freq: Four times a day (QID) | INTRAMUSCULAR | Status: DC | PRN

## 2018-02-05 MED ORDER — VITAMIN D 1000 UNITS PO TABS: 5000.0000 [IU] | ORAL_TABLET | ORAL | Status: DC

## 2018-02-05 MED ORDER — MAGNESIUM HYDROXIDE 400 MG/5ML PO SUSP
30.0000 mL | Freq: Every morning | ORAL | Status: DC
  Administered 2018-02-06: 30 mL via ORAL
  Filled 2018-02-05: qty 30

## 2018-02-05 MED ORDER — ALUM & MAG HYDROXIDE-SIMETH 200-200-20 MG/5ML PO SUSP
30.0000 mL | Freq: Every morning | ORAL | Status: DC
  Administered 2018-02-06: 30 mL via ORAL
  Filled 2018-02-05: qty 30

## 2018-02-05 MED ORDER — LABETALOL HCL 5 MG/ML IV SOLN
0.5000 mg/min | INTRAVENOUS | Status: DC
  Filled 2018-02-05: qty 100

## 2018-02-05 MED ORDER — LISINOPRIL 10 MG PO TABS
40.0000 mg | ORAL_TABLET | Freq: Every day | ORAL | Status: DC
  Administered 2018-02-06: 40 mg via ORAL
  Filled 2018-02-05: qty 4

## 2018-02-05 MED ORDER — AMIODARONE HCL 200 MG PO TABS
200.0000 mg | ORAL_TABLET | Freq: Every day | ORAL | Status: DC
  Administered 2018-02-06: 200 mg via ORAL
  Filled 2018-02-05: qty 1

## 2018-02-05 MED ORDER — DIVALPROEX SODIUM 250 MG PO DR TAB
375.0000 mg | DELAYED_RELEASE_TABLET | Freq: Two times a day (BID) | ORAL | Status: DC
  Administered 2018-02-06: 375 mg via ORAL
  Filled 2018-02-05 (×4): qty 1

## 2018-02-05 MED ORDER — METOPROLOL TARTRATE 50 MG PO TABS
50.0000 mg | ORAL_TABLET | Freq: Two times a day (BID) | ORAL | Status: DC
  Administered 2018-02-06 (×2): 50 mg via ORAL
  Filled 2018-02-05 (×2): qty 1

## 2018-02-05 MED ORDER — ACETAMINOPHEN 325 MG PO TABS
650.0000 mg | ORAL_TABLET | Freq: Three times a day (TID) | ORAL | Status: DC | PRN
  Filled 2018-02-05: qty 2

## 2018-02-05 MED ORDER — CLONIDINE HCL 0.2 MG PO TABS
0.2000 mg | ORAL_TABLET | Freq: Two times a day (BID) | ORAL | Status: DC
  Administered 2018-02-06 (×2): 0.2 mg via ORAL
  Filled 2018-02-05 (×2): qty 1

## 2018-02-05 MED ORDER — FLUOXETINE HCL 20 MG PO CAPS
20.0000 mg | ORAL_CAPSULE | Freq: Every morning | ORAL | Status: DC
  Administered 2018-02-06: 20 mg via ORAL
  Filled 2018-02-05: qty 1

## 2018-02-05 MED ORDER — CALCIUM CARBONATE ANTACID 500 MG PO CHEW
1.0000 | CHEWABLE_TABLET | Freq: Three times a day (TID) | ORAL | Status: DC
  Administered 2018-02-05 – 2018-02-06 (×2): 200 mg via ORAL
  Filled 2018-02-05 (×2): qty 1

## 2018-02-05 MED ORDER — ATORVASTATIN CALCIUM 40 MG PO TABS: 40.0000 mg | ORAL_TABLET | Freq: Every day | ORAL | Status: DC

## 2018-02-05 MED ORDER — AMLODIPINE BESYLATE 5 MG PO TABS
5.0000 mg | ORAL_TABLET | Freq: Every day | ORAL | Status: DC
  Administered 2018-02-05: 5 mg via ORAL
  Filled 2018-02-05: qty 1

## 2018-02-05 MED ORDER — FLUCONAZOLE 100 MG PO TABS: 200.0000 mg | ORAL_TABLET | ORAL | Status: DC

## 2018-02-05 MED ORDER — ENOXAPARIN SODIUM 40 MG/0.4ML ~~LOC~~ SOLN
40.0000 mg | SUBCUTANEOUS | Status: DC
  Administered 2018-02-06: 40 mg via SUBCUTANEOUS
  Filled 2018-02-05: qty 0.4

## 2018-02-05 MED ORDER — CARBIDOPA-LEVODOPA ER 50-200 MG PO TBCR
1.0000 | EXTENDED_RELEASE_TABLET | Freq: Two times a day (BID) | ORAL | Status: DC
  Filled 2018-02-05 (×5): qty 1

## 2018-02-05 MED ORDER — AMLODIPINE BESYLATE 5 MG PO TABS: 5.0000 mg | ORAL_TABLET | Freq: Every evening | ORAL | Status: DC

## 2018-02-05 MED ORDER — ONDANSETRON HCL 4 MG PO TABS: 4.0000 mg | ORAL_TABLET | ORAL | Status: DC | PRN

## 2018-02-05 MED ORDER — HYDRALAZINE HCL 20 MG/ML IJ SOLN: 10.0000 mg | Freq: Once | INTRAMUSCULAR | Status: DC

## 2018-02-05 NOTE — ED Notes (Signed)
Gave EKG to MD

## 2018-02-05 NOTE — ED Provider Notes (Signed)
[facility] EMERGENCY DEPARTMENT Provider Note   CSN: [id number] Arrival date & time: 02/05/18  1752     History   Chief Complaint Chief Complaint  Patient presents with  . Hypertension    HPI Todd Page is a 67 y.o. male.  HPI Presents from his nursing facility with staff concern of hypertensive crisis. The patient himself denies any ongoing complaints, including weakness, focal pain, lightheadedness. He does acknowledge multiple medical issues including atrial fibrillation, congestive heart failure, aortic dissection, and was recently hospitalized for this last concern. He notes that since discharge he has been taking all medication as directed, including clonidine, lisinopril, beta-blocker, without change in dosage. Today, the patient reportedly was found to be substantially hypertensive, with blood pressure of 2 2 2/1 2 0. In spite of multiple medications, including additional doses of clonidine, lisinopril, beta-blocker, he had persistent hypertension, and subsequent was found to be diaphoretic, pale. Patient recalls mild generalized weakness, but no focal pain throughout that time.. On arrival the patient states that he feels normal. Past Medical History:  Diagnosis Date  . A-fib ([place])   . Anemia   . Atrial fibrillation ([place]) 06/2012   onset 06/2012; normal TSH; EF-45%  . CHF (congestive heart failure) ([place])   . Cholelithiasis    incidental finding on CT in 2013  . Cognitive communication deficit   . Dysphagia   . Dysphagia   . Feeding difficulties   . Gout   . Hyperlipidemia   . Hypertension    minimal coronary atherosclerosis by CT  . Hypokalemia   . Liver disease   . Noncompliance 09/21/2012  . Osteoporosis   . Overweight(278.02)   . Parkinson's disease ([place])   . Peripheral vascular disease ([place])   . Renal disorder   . Schizoaffective disorder ([place])    With depression  . Systolic dysfunction   . Thoracic aortic aneurysm ([place])   . Thrombophilia ([place])     . Tinea cruris   . Vascular dementia   . Vitamin D deficiency     Patient Active Problem List   Diagnosis Date Noted  . 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[place]) 01/25/2018  . Hyperglycemia 03/19/2014  . CHF (congestive heart failure) ([place]) 03/19/2014  . Acute exacerbation of congestive heart failure ([place]) 03/18/2014  . Acute on chronic heart failure ([place]) 03/18/2014  . Chest pain 06/08/2013  . Tachypnea 05/25/2013  . Acute diastolic congestive heart failure ([place]) 05/25/2013  . Chronic kidney disease, stage III (moderate) ([place]) 05/25/2013  . Acute gout 05/25/2013  . Elevated troponin 05/18/2013  . Acute respiratory failure ([place]) 02/20/2013  . Lower extremity edema 02/17/2013  . Acute on chronic systolic HF (heart failure) ([place]) 12/25/2012  . Atrial fibrillation with rapid ventricular response ([place]) 12/24/2012  . Malignant hypertension 12/24/2012  . Dyspnea 12/24/2012  . Bilateral leg pain 12/24/2012  . Hypokalemia 12/24/2012  . Noncompliance 09/21/2012  . Hypertension 09/21/2012  . Atrial fibrillation ([place]) 07/26/2012  . Obesity 07/26/2012  . Schizoaffective disorder ([place]) 07/26/2012    Past Surgical History:  Procedure Laterality Date  . COLONOSCOPY  2006   normal screening examination  . prosthetic heart valve          Home Medications    Prior to Admission medications   Medication Sig Start Date End Date Taking? Authorizing Provider  acetaminophen (TYLENOL) 650 MG CR tablet Take 650 mg by mouth every 8 (eight) hours as needed for pain.    [provider]  alum &  mag hydroxide-simeth (MAALOX/MYLANTA) 200-200-20 MG/5ML suspension Take 30 mLs by mouth every 2 (two) hours as needed for indigestion or heartburn.    [provider]  amiodarone (PACERONE) 200 MG tablet Take 1 tablet (200 mg total) by mouth daily. 02/01/18   Barrett, Erin R, PA-C  atorvastatin (LIPITOR) 40 MG tablet Take 40 mg by mouth daily at 6 PM.    [provider]   bisacodyl (DULCOLAX) 5 MG EC tablet Take 10 mg by mouth daily as needed for moderate constipation.    [provider]  calcium carbonate (TUMS - DOSED IN MG ELEMENTAL CALCIUM) 500 MG chewable tablet Chew 1 tablet by mouth 3 (three) times daily.    [provider]  carbidopa-levodopa (SINEMET CR) 50-200 MG tablet Take 1 tablet by mouth 2 (two) times daily.    [provider]  cloNIDine (CATAPRES) 0.2 MG tablet Take 1 tablet (0.2 mg total) by mouth 2 (two) times daily. 01/31/18   Barrett, Erin R, PA-C  clotrimazole (LOTRIMIN) 1 % cream Apply 1 application topically 2 (two) times daily.    [provider]  divalproex (DEPAKOTE) 125 MG DR tablet Take 375 mg by mouth 2 (two) times daily.     [provider]  ergocalciferol (VITAMIN D2) 50000 units capsule Take 50,000 Units by mouth every 30 (thirty) days. Starting on the 1st and ending on the first every month    [provider]  fluconazole (DIFLUCAN) 200 MG tablet Take 200 mg by mouth See admin instructions. Take 1 tablet ( 200 mg totally) by mouth in the evening every Mon for fungal infection for 4 weeks    [provider]  FLUoxetine (PROZAC) 20 MG capsule Take 20 mg by mouth every morning.     [provider]  ipratropium-albuterol (DUONEB) 0.5-2.5 (3) MG/3ML SOLN Take 3 mLs by nebulization every 6 (six) hours as needed (for SOB; wheezing).    [provider]  levofloxacin (LEVAQUIN) 750 MG tablet Take 750 mg by mouth See admin instructions. Take 1 tablet (750 mg totally) by mouth at bedtime for infection in 3 days    [provider]  lisinopril (PRINIVIL,ZESTRIL) 40 MG tablet Take 1 tablet (40 mg total) by mouth daily. 02/01/18   Barrett, Erin R, PA-C  magnesium hydroxide (MILK OF MAGNESIA) 400 MG/5ML suspension Take 30 mLs by mouth daily as needed for mild constipation.    [provider]  metoprolol (LOPRESSOR) 50 MG tablet Take 50 mg by mouth 2 (two)  times daily.    [provider]  OLANZapine (ZYPREXA) 15 MG tablet Take 15 mg by mouth at bedtime.    [provider]  ondansetron (ZOFRAN) 4 MG tablet Take 4 mg by mouth every 4 (four) hours as needed for nausea or vomiting.    [provider]  predniSONE (DELTASONE) 20 MG tablet Take 40 mg by mouth at bedtime as needed (for SOB in 4 days).    [provider]    Family History Family History  Problem Relation Age of Onset  . Hypertension Mother   . Liver disease Father     Social History Social History   Tobacco Use  . Smoking status: Never Smoker  . Smokeless tobacco: Never Used  Substance Use Topics  . Alcohol use: No    Comment: former   . Drug use: No     Allergies   Patient has no known allergies.   Review of Systems Review of Systems  Constitutional:  Per HPI, otherwise negative  HENT:       Per HPI, otherwise negative  Respiratory:       Per HPI, otherwise negative  Cardiovascular:       Per HPI, otherwise negative  Gastrointestinal: Negative for vomiting.  Endocrine:       Negative aside from HPI  Genitourinary:       Neg aside from HPI   Musculoskeletal:       Per HPI, otherwise negative  Skin: Negative.   Neurological: Positive for weakness. Negative for syncope.     Physical Exam Updated Vital Signs BP (!) 141/87   Pulse 73   Temp 97.6 F (36.4 C)   Resp 18   Ht 5\' 11"  (1.803 m)   Wt 103.4 kg (228 lb)   SpO2 98%   BMI 31.80 kg/m   Physical Exam  Constitutional: He is oriented to person, place, and time. He appears well-developed. No distress.  HENT:  Head: Normocephalic and atraumatic.  Eyes: Conjunctivae and EOM are normal.  Cardiovascular: Normal rate and regular rhythm.  Pulmonary/Chest: Effort normal. No stridor. No respiratory distress.  Abdominal: He exhibits no distension.  Musculoskeletal: He exhibits no edema.  Neurological: He is alert and oriented to person, place, and time.    Skin: Skin is warm and dry.  Psychiatric: He has a normal mood and affect.  Nursing note and vitals reviewed.    ED Treatments / Results  Labs (all labs ordered are listed, but only abnormal results are displayed) Labs Reviewed  COMPREHENSIVE METABOLIC PANEL - Abnormal; Notable for the following components:      Result Value   Chloride 100 (*)    Glucose, Bld 125 (*)    Albumin 2.8 (*)    ALT 11 (*)    All other components within normal limits  BRAIN NATRIURETIC PEPTIDE - Abnormal; Notable for the following components:   B Natriuretic Peptide 244.0 (*)    All other components within normal limits  CBC WITH DIFFERENTIAL/PLATELET  TROPONIN I    EKG EKG Interpretation  Date/Time:  Wednesday February 05 2018 17:59:04 EDT Ventricular Rate:  72 PR Interval:    QRS Duration: 91 QT Interval:  466 QTC Calculation: 503 R Axis:   52 Text Interpretation:  Atrial fibrillation Prolonged QT interval Artifact Abnormal ekg Confirmed by Carmin Muskrat [id number]) on 02/05/2018 6:32:15 PM   Radiology Dg Chest 2 View  Result Date: 02/05/2018 CLINICAL DATA:  C 67 y/o  M; 1 month of chronic chest pain. EXAM: CHEST - 2 VIEW COMPARISON:  01/28/2018 chest radiograph.  01/25/2018 CT chest. FINDINGS: Stable white mediastinum compatible with known dissection. Stable cardiac silhouette. Elevated right hemidiaphragm. Streaky opacities in the lung bases probably represents atelectasis. Pulmonary vascular congestion. No consolidation. No pleural effusion or pneumothorax. Bones are unremarkable. IMPRESSION: Stable white mediastinum compatible with known dissection. Elevated right hemidiaphragm. Minor bibasilar atelectasis. Electronically Signed   By: Kristine Garbe M.D.   On: 02/05/2018 20:27    Procedures Procedures (including critical care time)  Medications Ordered in ED Medications - No data to display   Initial Impression / Assessment and Plan / ED Course  I have reviewed the triage vital  signs and the nursing notes.  Pertinent labs & imaging results that were available during my care of the patient were reviewed by me and considered in my medical decision making (see chart for details).   Initial blood pressure 1 4 0/85. Patient placed on continuous cardiac monitor on arrival,  and after the initial evaluation reviewed the patient's chart, including detail of hospital course from discharge during last admission, January 27, 2018, as below  "Brief Hospital Course:  Patient has remained afebrile and hemodynamically stable.  His blood pressure was initially controlled with Labetalol and Nicardipine drips. He was switched to oral antihypertensives. His  blood pressure remains well controlled on Clonidine, Lisinopril, Metoprolol Tartrate.  He developed Atrial Fibrillation.  He was treated with Amiodarone and remains rate controlled.  He is not a candidate for oral anticoagulation. Physical therapy was arranged as he is mostly non ambulatory. He is on room air, tolerating a diet.  Repeat CTA of the chest showedType B thoracic aortic dissection is again noted which is stable in size and appearance. There is a stable amount of intramural and mediastinal hematoma present suggesting contained rupture as described on prior exam. Great vessels are widely patent without significant stenosis. Marland Kitchen  He is now pain free.  He is medically stable for discharge back to [facility] today."   Nursing notes from today's hypertensive episode at his facility also reviewed, notable for provision of clonidine Lopressor chlorthalidone additional Lopressor prior to transfer.   Initial evaluation I discussed patient's case with our cardiothoracic colleagues who took care of the patient last week at [facility].  On repeat exam patient is in similar condition, blood pressure remains controlled.   8:57 PM Labs, x-ray reassuring, and with no asymmetry of pulses, no pain, low suspicion for  progression of his dissection, though given concern for the substantial amount of medication required to continued blood pressure, the patient will be admitted for overnight monitoring, management of his blood pressure.  Final Clinical Impressions(s) / ED Diagnoses  Hypertensive urgency   Carmin Muskrat, MD 02/05/18 2058

## 2018-02-05 NOTE — ED Triage Notes (Signed)
Pt resident of [facility] and staff reports pt reported seeing spots in his vision at 0330 this morning.  ALso bp 190/110.  Pt has thoracic aortic aneurysm 4cm.  BP continued to be elevated throughout the day.  Pt was given clonidine 0.1mg  at 10am and lopressor 50mg  at 11am.  Pt alert and oriented, no sob or chest pain.  Staff reported pt appears more pale than usual and bp still elevated.  Staff spoke to cardiologist and was instructed to send pt to [facility] for eval.

## 2018-02-05 NOTE — ED Notes (Signed)
Gave blood work to Charity fundraiser

## 2018-02-05 NOTE — H&P (Signed)
4        History and Physical    Todd Page KGU:[id number] DOB: [birth year] DOA: 02/05/2018  PCP: Hilbert Corrigan, MD   Patient coming from: [place].  I have personally briefly reviewed patient's old medical records in [place]  Chief Complaint: Uncontrolled high blood pressure.  HPI: Todd Page is a 67 y.o. male with medical history significant of atrial fibrillation, anemia, diastolic CHF, systolic dysfunction history, cholelithiasis, vascular dementia, history of dysphagia/feeding difficulties, gout, hyperlipidemia, hypertension, minimal CAD by CT, osteoporosis, nonspecified liver disease, Parkinson's disease, peripheral vascular disease, renal disorder, schizoaffective disorder with depression, thrombophilia, tinea cruris, vitamin D deficiency, recently admitted and discharged by Dr. Tharon Aquas Trigt due to having at [facility] type B thoracic aortic dissection, deemed not a surgical candidate and discharged back to the nursing home for medical management with blood pressure control.  Today, he was brought to the emergency department due to reporting seeing spots in his vision around 3:30 in the morning and hypertension of 190/110 mmHg.  His blood pressure has been elevated throughout the day despite being given clonidine 0.1 mg at 10 AM and Lopressor 50mg  at 11 a.m. he denies chest pain, palpitations, dizziness, diaphoresis, orthopnea, PND, but states he gets frequent lower extremity edema.  No abdominal pain, nausea, emesis, diarrhea, vomiting, hematochezia or melena.  Denies dysuria or hematuria.  No polyuria or polydipsia.  ED Course: Initial vital signs temperature 97.38F, pulse 73, respirations 18, blood pressure 141/87 mmHg and O2 sat 98% on room air.  Hydrochlorothiazide 25 mg p.o. x1 order, but discontinue by myself.  No other meds were ordered or given in the ED.  CBC was normal.  Troponin was negative.  EKG shows atrial fibrillation with mildly prolonged QT segment.  CMP shows that sodium of 143, potassium 3.6, CO2 29 and chloride of 100 millimol/L.  Glucose 125 BUN 20, creatinine 0.97 and calcium 9.5 mg/dL.  His albumin was 2.8 g/dL and ALT 11 U/L.  All other hepatic function tests are within normal limits.  Chest radiograph shows stable mediastinum.  Please see images and full radiology report for further detail.  Review of Systems: As per HPI otherwise 10 point review of systems negative.   Past Medical History:  Diagnosis Date  . A-fib ([place])   . Anemia   . Atrial fibrillation ([place]) 06/2012   onset 06/2012; normal TSH; EF-45%  . CHF (congestive heart failure) ([place])   . Cholelithiasis    incidental finding on CT in 2013  . Cognitive communication deficit   . Dysphagia   . Dysphagia   . Feeding difficulties   . Gout   . Hyperlipidemia   . Hypertension    minimal coronary atherosclerosis by CT  . Hypokalemia   . Liver disease   . Noncompliance 09/21/2012  . Osteoporosis   . Overweight(278.02)   . Parkinson's disease ([place])   . Peripheral vascular disease ([place])   . Renal disorder   . Schizoaffective disorder ([place])    With depression  . Systolic dysfunction   . Thoracic aortic aneurysm ([place])   . Thrombophilia ([place])   . Tinea cruris   . Vascular dementia   . Vitamin D deficiency     Past Surgical History:  Procedure Laterality Date  . COLONOSCOPY  2006   normal screening examination  . prosthetic heart valve       reports that he has never smoked. He has never used smokeless tobacco. He reports that he  does not drink alcohol or use drugs.  No Known Allergies  Family History  Problem Relation Age of Onset  . Hypertension Mother   . Liver disease Father    Prior to Admission medications   Medication Sig Start Date End Date Taking? Authorizing Provider  acetaminophen (TYLENOL) 650 MG CR tablet Take 650 mg by mouth every 8 (eight) hours as needed for pain.   Yes [provider]  alum & mag hydroxide-simeth (MAALOX/MYLANTA)  200-200-20 MG/5ML suspension Take 30 mLs by mouth every morning.    Yes [provider]  amiodarone (PACERONE) 200 MG tablet Take 1 tablet (200 mg total) by mouth daily. 02/01/18  Yes Barrett, Erin R, PA-C  atorvastatin (LIPITOR) 40 MG tablet Take 40 mg by mouth daily at 6 PM.   Yes [provider]  bisacodyl (DULCOLAX) 5 MG EC tablet Take 10 mg by mouth every morning.    Yes [provider]  calcium carbonate (TUMS - DOSED IN MG ELEMENTAL CALCIUM) 500 MG chewable tablet Chew 1 tablet by mouth 3 (three) times daily.   Yes [provider]  carbidopa-levodopa (SINEMET CR) 50-200 MG tablet Take 1 tablet by mouth 2 (two) times daily.   Yes [provider]  Cholecalciferol (VITAMIN D3) 5000 units CAPS Take 1 capsule by mouth every morning.   Yes [provider]  cloNIDine (CATAPRES) 0.2 MG tablet Take 1 tablet (0.2 mg total) by mouth 2 (two) times daily. 01/31/18  Yes Barrett, Erin R, PA-C  clotrimazole (LOTRIMIN) 1 % cream Apply 1 application topically 2 (two) times daily.   Yes [provider]  divalproex (DEPAKOTE) 125 MG DR tablet Take 375 mg by mouth 2 (two) times daily.    Yes [provider]  fluconazole (DIFLUCAN) 200 MG tablet Take 200 mg by mouth See admin instructions. Take 1 tablet ( 200 mg totally) by mouth in the evening every Mon for fungal infection for 4 weeks   Yes [provider]  FLUoxetine (PROZAC) 20 MG capsule Take 20 mg by mouth every morning.    Yes [provider]  ipratropium-albuterol (DUONEB) 0.5-2.5 (3) MG/3ML SOLN Take 3 mLs by nebulization every 6 (six) hours as needed (for SOB; wheezing).   Yes [provider]  lisinopril (PRINIVIL,ZESTRIL) 40 MG tablet Take 1 tablet (40 mg total) by mouth daily. 02/01/18  Yes Barrett, Erin R, PA-C  magnesium hydroxide (MILK OF MAGNESIA) 400 MG/5ML suspension Take 30 mLs by mouth every morning.    Yes [provider]  metoprolol (LOPRESSOR)  50 MG tablet Take 50 mg by mouth 2 (two) times daily.   Yes [provider]  OLANZapine (ZYPREXA) 15 MG tablet Take 15 mg by mouth at bedtime.   Yes [provider]  ondansetron (ZOFRAN) 4 MG tablet Take 4 mg by mouth every 4 (four) hours as needed for nausea or vomiting.   Yes [provider]  predniSONE (DELTASONE) 20 MG tablet Take 40 mg by mouth at bedtime as needed (for SOB in 4 days).   Yes [provider]    Physical Exam: Vitals:   02/05/18 2100 02/05/18 2130 02/05/18 2257 02/05/18 2301  BP: (!) 153/100   (!) 173/102  Pulse: 65 65  67  Resp: 18 19  16   Temp:    97.6 F (36.4 C)  TempSrc:    Oral  SpO2: 95% 96%  98%  Weight:   103 kg (227 lb 1.2 oz)   Height:   5'  11" (1.803 m)     Constitutional: NAD, calm, comfortable Eyes: PERRL, lids and conjunctivae normal ENMT: Mucous membranes are moist. Posterior pharynx clear of any exudate or lesions. Neck: normal, supple, no masses, no thyromegaly Respiratory: Decreased breath sounds on bases, otherwise clear to auscultation bilaterally, no wheezing, no crackles. Normal respiratory effort. No accessory muscle use.  Cardiovascular: Irregularly irregular, no murmurs / rubs / gallops. No extremity edema. 2+ pedal pulses. No carotid bruits.  Abdomen: Soft, no tenderness, no masses palpated. No hepatosplenomegaly. Bowel sounds positive.  Musculoskeletal: no clubbing / cyanosis. Good ROM, no contractures. Normal muscle tone.  Skin: areas of ecchymosis on upper extremities. Neurologic: CN 2-12 grossly intact. Sensation intact, DTR normal. Strength 5/5 in all 4.  Psychiatric: Alert and oriented x 2, partially oriented to time and situation..  Depressed mood.     Labs on Admission: I have personally reviewed following labs and imaging studies  CBC: Recent Labs  Lab 01/31/18 0304 02/05/18 1829  WBC 10.7* 8.9  NEUTROABS  --  6.1  HGB 11.8* 14.2  HCT 37.8* 44.6  MCV 87.1 87.5  PLT 158 299    Basic Metabolic Panel: Recent Labs  Lab 01/31/18 0304 02/05/18 1829  NA 138 143  K 4.1 3.6  CL 103 100*  CO2 26 29  GLUCOSE 85 125*  BUN 16 20  CREATININE 0.86 0.97  CALCIUM 8.5* 9.5   GFR: Estimated Creatinine Clearance: 91.5 mL/min (by C-G formula based on SCr of 0.97 mg/dL). Liver Function Tests: Recent Labs  Lab 02/05/18 1829  AST 15  ALT 11*  ALKPHOS 70  BILITOT 0.6  PROT 6.6  ALBUMIN 2.8*   No results for input(s): LIPASE, AMYLASE in the last 168 hours. No results for input(s): AMMONIA in the last 168 hours. Coagulation Profile: No results for input(s): INR, PROTIME in the last 168 hours. Cardiac Enzymes: Recent Labs  Lab 02/05/18 1829  TROPONINI <0.03   BNP (last 3 results) No results for input(s): PROBNP in the last 8760 hours. HbA1C: No results for input(s): HGBA1C in the last 72 hours. CBG: Recent Labs  Lab 01/30/18 1229 01/30/18 1738 01/30/18 2119 01/31/18 0753 01/31/18 1248  GLUCAP 102* 94 96 88 133*   Lipid Profile: No results for input(s): CHOL, HDL, LDLCALC, TRIG, CHOLHDL, LDLDIRECT in the last 72 hours. Thyroid Function Tests: No results for input(s): TSH, T4TOTAL, FREET4, T3FREE, THYROIDAB in the last 72 hours. Anemia Panel: No results for input(s): VITAMINB12, FOLATE, FERRITIN, TIBC, IRON, RETICCTPCT in the last 72 hours. Urine analysis:    Component Value Date/Time   COLORURINE Yellow 11/25/2014 1413   COLORURINE YELLOW 06/06/2013 0937   APPEARANCEUR Clear 11/25/2014 1413   LABSPEC 1.015 11/25/2014 1413   PHURINE 6.0 11/25/2014 1413   PHURINE 6.5 06/06/2013 0937   GLUCOSEU Negative 11/25/2014 1413   HGBUR Negative 11/25/2014 1413   HGBUR TRACE (A) 06/06/2013 0937   BILIRUBINUR Negative 11/25/2014 1413   KETONESUR Negative 11/25/2014 1413   KETONESUR NEGATIVE 06/06/2013 0937   PROTEINUR Negative 11/25/2014 1413   PROTEINUR TRACE (A) 06/06/2013 0937   UROBILINOGEN 0.2 06/06/2013 0937   NITRITE Negative 11/25/2014 1413    NITRITE NEGATIVE 06/06/2013 0937   LEUKOCYTESUR Negative 11/25/2014 1413    Radiological Exams on Admission: Dg Chest 2 View  Result Date: 02/05/2018 CLINICAL DATA:  C 67 y/o  M; 1 month of chronic chest pain. EXAM: CHEST - 2 VIEW COMPARISON:  01/28/2018 chest radiograph.  01/25/2018 CT chest. FINDINGS: Stable white mediastinum compatible  with known dissection. Stable cardiac silhouette. Elevated right hemidiaphragm. Streaky opacities in the lung bases probably represents atelectasis. Pulmonary vascular congestion. No consolidation. No pleural effusion or pneumothorax. Bones are unremarkable. IMPRESSION: Stable white mediastinum compatible with known dissection. Elevated right hemidiaphragm. Minor bibasilar atelectasis. Electronically Signed   By: Kristine Garbe M.D.   On: 02/05/2018 20:27    EKG: Independently reviewed. Vent. rate 72 BPM PR interval * ms QRS duration 91 ms QT/QTc 466/503 ms P-R-T axes * 52 74 Atrial fibrillation Prolonged QT interval  Assessment/Plan Principal Problem:   Hypertensive urgency Observation/telemetry. At this time, the patient does not need stepdown or antihypertensive infusion. I will continue his regular meds (clonidine, lisinopril and metoprolol at her current doses). Start antihypertensive therapy via infusion, if unable to control blood pressure.  Active Problems:   Hypertension Continue clonidine 0.2 mg p.o. twice daily. Continue lisinopril 40 mg p.o. daily. Continue metoprolol tartrate 50 mg p.o. twice daily. Start amlodipine 5 mg p.o. every evening. Monitor blood pressure, heart rate, renal function and electrolytes.    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HCC) No acute intervention or surgical candidate per cardiothoracic surgery. However, they would like the patient observed overnight for blood pressure control.    Atrial fibrillation (HCC) CHA?DS?-VASc Score of at least 6. Used to be on Eliquis, but not on chronic  anticoagulation anymore. Continue amiodarone 200 mg p.o. daily. Continue metoprolol 50 mg p.o. twice daily.    Schizoaffective disorder (HCC) Continue Zyprexa 50 mg p.o. at bedtime. Continue Depakote 375 mg p.o. twice daily. Continue fluoxetine 20 mg p.o. Daily.    Hyperlipidemia Continue atorvastatin 40 mg p.o. daily Monitor LFTs as needed. Fasting lipid panel to be followed by PCP.    Parkinson's disease (HCC) Continue Sinemet CR 50- 200 p.o. twice daily. Supportive care.    Chronic diastolic CHF (congestive heart failure) (HCC) Although BNP is elevated, no clinical signs of decompensation at this time. Continue beta-blocker and ACE inhibitor.  Consider adding low-dose diuretic.    DVT prophylaxis: Lovenox SQ. Code Status: Full code. Family Communication: None at bedside. Disposition Plan: Overnight observation for blood pressure control. Consults called:  Admission status: Observation/telemetry.   Reubin Milan MD [name] Pager [phone].  If 7PM-7AM, please contact night-coverage [email or web address] Password [facility]  02/05/2018, 11:26 PM   This document was prepared using Dragon voice recognition software and may contain some unintended transcription errors.

## 2018-02-05 NOTE — Telephone Encounter (Signed)
Arbie Cookey from [facility] called to relate that Todd Page has had a very high BP recently. The house medical provider has been adjusting his meds but today his BP is still elevated ..160/120 in one arm, 160/100 in the other. She wants to have him see Dr. Prescott Gum sooner than the May appointment that has been scheduled. I consulted with Dr. Prescott Gum  and he gave me new orders for his Metoprolol and then Clonidine if needed. I called her back to relay this. She said the provider was sending him to the hospital due to vision changes, also. He will be transferred to [facility]. Dr. Prescott Gum was made aware.

## 2018-02-06 ENCOUNTER — Other Ambulatory Visit: Payer: Self-pay

## 2018-02-06 DIAGNOSIS — I160 Hypertensive urgency: Secondary | ICD-10-CM | POA: Diagnosis not present

## 2018-02-06 DIAGNOSIS — I5032 Chronic diastolic (congestive) heart failure: Secondary | ICD-10-CM

## 2018-02-06 DIAGNOSIS — I10 Essential (primary) hypertension: Secondary | ICD-10-CM | POA: Diagnosis not present

## 2018-02-06 DIAGNOSIS — F251 Schizoaffective disorder, depressive type: Secondary | ICD-10-CM

## 2018-02-06 DIAGNOSIS — G20 Parkinson's disease: Secondary | ICD-10-CM

## 2018-02-06 DIAGNOSIS — E785 Hyperlipidemia, unspecified: Secondary | ICD-10-CM | POA: Diagnosis present

## 2018-02-06 DIAGNOSIS — I4891 Unspecified atrial fibrillation: Secondary | ICD-10-CM

## 2018-02-06 LAB — MAGNESIUM: Magnesium: 1.8 mg/dL (ref 1.7–2.4)

## 2018-02-06 LAB — BASIC METABOLIC PANEL
Anion gap: 10 (ref 5–15)
BUN: 20 mg/dL (ref 6–20)
CALCIUM: 8.7 mg/dL — AB (ref 8.9–10.3)
CO2: 29 mmol/L (ref 22–32)
CREATININE: 0.79 mg/dL (ref 0.61–1.24)
Chloride: 104 mmol/L (ref 101–111)
GFR calc Af Amer: >60 mL/min (ref >60)
GFR calc non Af Amer: >60 mL/min (ref >60)
GLUCOSE: 94 mg/dL (ref 65–99)
Potassium: 3.6 mmol/L (ref 3.5–5.1)
Sodium: 143 mmol/L (ref 135–145)

## 2018-02-06 LAB — MRSA PCR SCREENING: MRSA by PCR: NEGATIVE

## 2018-02-06 MED ORDER — CARBIDOPA-LEVODOPA ER 25-100 MG PO TBCR
2.0000 | EXTENDED_RELEASE_TABLET | Freq: Two times a day (BID) | ORAL | Status: DC
  Administered 2018-02-06: 2 via ORAL
  Filled 2018-02-06: qty 2

## 2018-02-06 MED ORDER — AMLODIPINE BESYLATE 5 MG PO TABS: 5.0000 mg | ORAL_TABLET | Freq: Every evening | ORAL | Status: DC

## 2018-02-06 NOTE — Discharge Instructions (Signed)
1. Follow up with PCP in 1-2 weeks °2. Please obtain BMP/CBC in one week ° ° ° °

## 2018-02-06 NOTE — Clinical Social Work Note (Signed)
Discharge clinicals sent to facility. Lynnae Sandhoff at [facility] advised of discharge. RN arranged transport.   LCSW signing off.      Trevious Rampey, Clydene Pugh, LCSW

## 2018-02-06 NOTE — Progress Notes (Signed)
Called in report to Leda Roys, RN at [phone]. Secretary is calling EMS to transport patient. NT has taken IV out and telemetry monitoring off.

## 2018-02-06 NOTE — Discharge Summary (Signed)
Physician Discharge Summary  Todd Page DJS:[id number] DOB: 07-Mar-1951 DOA: 02/05/2018  PCP: Hilbert Corrigan, MD  Admit date: 02/05/2018 Discharge date: 02/06/2018  Admitted From: skilled nursing facility Disposition:  Skilled nursing facility  Recommendations for Outpatient Follow-up:  1. Follow up with PCP in 1-2 weeks 2. Please obtain BMP/CBC in one week  Discharge Condition: stable CODE STATUS: full code Diet recommendation: Heart Healthy   Brief/Interim Summary: 67 year old male with a history of atrial fibrillation, not on anticoagulation, diastolic congestive heart failure, vascular dementia, Parkinson's disease, schizoaffective disorder with depression, was recently admitted to the hospital under cardiothoracic service for having a Stanford type B thoracic aortic dissection.  He was not felt to be a surgical candidate and was discharged back to the nursing home for medical management blood pressure control.  Patient presents to the hospital with uncontrolled high blood pressure, seeing spots in his vision and blood pressure of 190/110.  He reported compliance with his medications.  Patient was taking clonidine, Lopressor and lisinopril.  He was monitored in the hospital and amlodipine was also added to his medication regimen.  Follow-up blood pressures in the hospital have been stable.  Most recent blood pressure noted to be 136/98.  Patient is asymptomatic at this time.  Lab work is otherwise unrevealing.  His chronic medical issues have remained stable.  We will plan on discharge back to nursing home today for ongoing monitoring of his blood pressure.  Discharge Diagnoses:  Principal Problem:   Hypertensive urgency Active Problems:   Atrial fibrillation (HCC)   Schizoaffective disorder (HCC)   Hypertension   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HCC)   Parkinson's disease (HCC)   Chronic diastolic CHF (congestive heart failure) ([place])    Hyperlipidemia    Discharge Instructions  Discharge Instructions    Diet - low sodium heart healthy   Complete by:  As directed    Increase activity slowly   Complete by:  As directed      Allergies as of 02/06/2018   No Known Allergies     Medication List    TAKE these medications   acetaminophen 650 MG CR tablet Commonly known as:  TYLENOL Take 650 mg by mouth every 8 (eight) hours as needed for pain.   alum & mag hydroxide-simeth 200-200-20 MG/5ML suspension Commonly known as:  MAALOX/MYLANTA Take 30 mLs by mouth every morning.   amiodarone 200 MG tablet Commonly known as:  PACERONE Take 1 tablet (200 mg total) by mouth daily.   amLODipine 5 MG tablet Commonly known as:  NORVASC Take 1 tablet (5 mg total) by mouth every evening.   atorvastatin 40 MG tablet Commonly known as:  LIPITOR Take 40 mg by mouth daily at 6 PM.   bisacodyl 5 MG EC tablet Commonly known as:  DULCOLAX Take 10 mg by mouth every morning.   calcium carbonate 500 MG chewable tablet Commonly known as:  TUMS - dosed in mg elemental calcium Chew 1 tablet by mouth 3 (three) times daily.   carbidopa-levodopa 50-200 MG tablet Commonly known as:  SINEMET CR Take 1 tablet by mouth 2 (two) times daily.   cloNIDine 0.2 MG tablet Commonly known as:  CATAPRES Take 1 tablet (0.2 mg total) by mouth 2 (two) times daily.   clotrimazole 1 % cream Commonly known as:  LOTRIMIN Apply 1 application topically 2 (two) times daily.   divalproex 125 MG DR tablet Commonly known as:  DEPAKOTE Take 375 mg by mouth 2 (two) times  daily.   fluconazole 200 MG tablet Commonly known as:  DIFLUCAN Take 200 mg by mouth See admin instructions. Take 1 tablet ( 200 mg totally) by mouth in the evening every Mon for fungal infection for 4 weeks   FLUoxetine 20 MG capsule Commonly known as:  PROZAC Take 20 mg by mouth every morning.   ipratropium-albuterol 0.5-2.5 (3) MG/3ML Soln Commonly known as:  DUONEB Take 3  mLs by nebulization every 6 (six) hours as needed (for SOB; wheezing).   lisinopril 40 MG tablet Commonly known as:  PRINIVIL,ZESTRIL Take 1 tablet (40 mg total) by mouth daily.   magnesium hydroxide 400 MG/5ML suspension Commonly known as:  MILK OF MAGNESIA Take 30 mLs by mouth every morning.   metoprolol tartrate 50 MG tablet Commonly known as:  LOPRESSOR Take 50 mg by mouth 2 (two) times daily.   OLANZapine 15 MG tablet Commonly known as:  ZYPREXA Take 15 mg by mouth at bedtime.   ondansetron 4 MG tablet Commonly known as:  ZOFRAN Take 4 mg by mouth every 4 (four) hours as needed for nausea or vomiting.   predniSONE 20 MG tablet Commonly known as:  DELTASONE Take 40 mg by mouth at bedtime as needed (for SOB in 4 days).   Vitamin D3 5000 units Caps Take 1 capsule by mouth every morning.       No Known Allergies  Consultations:     Procedures/Studies: Dg Chest 2 View  Result Date: 02/05/2018 CLINICAL DATA:  C 67 y/o  M; 1 month of chronic chest pain. EXAM: CHEST - 2 VIEW COMPARISON:  01/28/2018 chest radiograph.  01/25/2018 CT chest. FINDINGS: Stable white mediastinum compatible with known dissection. Stable cardiac silhouette. Elevated right hemidiaphragm. Streaky opacities in the lung bases probably represents atelectasis. Pulmonary vascular congestion. No consolidation. No pleural effusion or pneumothorax. Bones are unremarkable. IMPRESSION: Stable white mediastinum compatible with known dissection. Elevated right hemidiaphragm. Minor bibasilar atelectasis. Electronically Signed   By: Kristine Garbe M.D.   On: 02/05/2018 20:27   Dg Chest Port 1 View  Result Date: 01/28/2018 CLINICAL DATA:  Status post aortic dissection distal to the left subclavian artery origin. History of CHF and hypertension EXAM: PORTABLE CHEST 1 VIEW COMPARISON:  Portable chest x-ray of January 26, 2018 FINDINGS: The right hemidiaphragm remains higher than the left. There is new partial  obscuration of the left hemidiaphragm however. There small amounts of pleural fluid likely laying layering posteriorly, bilaterally. The cardiac silhouette is enlarged. The pulmonary interstitial markings are mildly increased. There is calcification in the wall of the aortic arch. There is widening of the mediastinum similar to that seen previously. IMPRESSION: Persistent mediastinal widening consistent with known aortic dissection. Stable cardiomegaly. Mild central pulmonary vascular congestion and interstitial edema. Probable left lower lobe atelectasis. Small bilateral pleural effusions. Electronically Signed   By: David  Martinique M.D.   On: 01/28/2018 08:06   Dg Chest Port 1 View  Result Date: 01/26/2018 CLINICAL DATA:  Chest pain EXAM: PORTABLE CHEST 1 VIEW COMPARISON:  CT from yesterday FINDINGS: Cardiomegaly and vascular pedicle widening with rightward displacement of the trachea, similar to previous radiograph when allowing for differences in technique (upright previously) and inflation. Mild atelectatic type opacities. No Kerley lines. No pneumothorax. IMPRESSION: 1. Known aortic dissection with similar cardiomediastinal enlargement. 2. Low volumes with mild atelectasis.  Negative for failure. Electronically Signed   By: Monte Fantasia M.D.   On: 01/26/2018 09:30   Dg Chest Portable 1 View  Result Date:  01/25/2018 CLINICAL DATA:  Acute chest pain today. EXAM: PORTABLE CHEST 1 VIEW COMPARISON:  11/25/2014 and prior radiographs. FINDINGS: The LEFT mediastinum in the region of the aortic arch appears more prominent when compared to prior studies could be technical as this is portable, slightly lordotic and the patient is rotated. Cardiomegaly again identified. This is a low volume film. No airspace disease, pleural effusion or pneumothorax noted. IMPRESSION: Slightly more prominent LEFT SUPERIOR mediastinum in the region the aortic arch-question technical as this is a portable, slightly lordotic and  rotated study. If there is strong clinical concern for thoracic aortic abnormality recommend CTA chest. Otherwise consider short-term repeat chest radiograph. Cardiomegaly. Electronically Signed   By: Margarette Canada M.D.   On: 01/25/2018 14:00   Ct Angio Chest/abd/pel For Dissection W And/or W/wo  Result Date: 01/30/2018 CLINICAL DATA:  Thoracic aortic dissection. EXAM: CT ANGIOGRAPHY CHEST, ABDOMEN AND PELVIS TECHNIQUE: Multidetector CT imaging through the chest, abdomen and pelvis was performed using the standard protocol during bolus administration of intravenous contrast. Multiplanar reconstructed images and MIPs were obtained and reviewed to evaluate the vascular anatomy. CONTRAST:  180mL ISOVUE-370 IOPAMIDOL (ISOVUE-370) INJECTION 76% COMPARISON:  CT scan of January 25, 2018. FINDINGS: CTA CHEST FINDINGS Cardiovascular: Continued presence of type B thoracic aortic dissection is noted. There continues remain intramural and mediastinal hematoma concerning for possible contained rupture. Mild pericardial effusion is noted which was present on prior exam. The great vessels are widely patent without significant stenosis. Mediastinum/Nodes: No enlarged mediastinal, hilar, or axillary lymph nodes. Thyroid gland, trachea, and esophagus demonstrate no significant findings. Lungs/Pleura: No pneumothorax is noted. Moderate bilateral pleural effusions are noted with adjacent subsegmental atelectasis. Musculoskeletal: No chest wall abnormality. No acute or significant osseous findings. Review of the MIP images confirms the above findings. CTA ABDOMEN AND PELVIS FINDINGS VASCULAR Aorta: Thoracic aortic dissection extends to supraceliac aorta, but is not seen distally. Atherosclerosis of abdominal aorta is noted without aneurysm. Celiac: Patent without evidence of aneurysm, dissection, vasculitis or significant stenosis. SMA: Patent without evidence of aneurysm, dissection, vasculitis or significant stenosis. Renals: Both  renal arteries are patent without evidence of aneurysm, dissection, vasculitis, fibromuscular dysplasia or significant stenosis. IMA: Patent without evidence of aneurysm, dissection, vasculitis or significant stenosis. Inflow: Patent without evidence of aneurysm, dissection, vasculitis or significant stenosis. Veins: No obvious venous abnormality within the limitations of this arterial phase study. Review of the MIP images confirms the above findings. NON-VASCULAR Hepatobiliary: Cholelithiasis is noted. No biliary dilatation is noted. Liver is unremarkable. Pancreas: Unremarkable. No pancreatic ductal dilatation or surrounding inflammatory changes. Spleen: Normal in size without focal abnormality. Adrenals/Urinary Tract: Adrenal glands appear normal. Stable exophytic right renal cyst is noted. No hydronephrosis or renal obstruction is noted. Bilateral nephrolithiasis is noted. Urinary bladder is unremarkable. Stomach/Bowel: Stomach is within normal limits. Appendix appears normal. No evidence of bowel wall thickening, distention, or inflammatory changes. Lymphatic: No significant adenopathy is noted. Reproductive: Prostate is unremarkable. Other: No abdominal wall hernia or abnormality. No abdominopelvic ascites. Musculoskeletal: No acute or significant osseous findings. Review of the MIP images confirms the above findings. IMPRESSION: Type B thoracic aortic dissection is again noted which is stable in size and appearance. There is a stable amount of intramural and mediastinal hematoma present suggesting contained rupture as described on prior exam. Great vessels are widely patent without significant stenosis. Stable pericardial effusion. Mild bilateral pleural effusions are noted which are increased in size compared to prior exam, with associated subsegmental atelectasis. Bilateral nonobstructive nephrolithiasis. No  hydronephrosis or renal obstruction is noted. Cholelithiasis. Thoracic aortic dissection is seen to  extend to the supraceliac abdominal aorta, but no dissection or aneurysm is seen more distally. Mesenteric and renal arteries are widely patent without significant stenosis. Aortic Atherosclerosis (ICD10-I70.0). Electronically Signed   By: Marijo Conception, M.D.   On: 01/30/2018 12:07   Ct Angio Chest/abd/pel For Dissection W And/or W/wo  Result Date: 01/25/2018 CLINICAL DATA:  Chest and back pain.  Evaluate for dissection. EXAM: CT ANGIOGRAPHY CHEST, ABDOMEN AND PELVIS TECHNIQUE: Multidetector CT imaging through the chest, abdomen and pelvis was performed using the standard protocol during bolus administration of intravenous contrast. Multiplanar reconstructed images and MIPs were obtained and reviewed to evaluate the vascular anatomy. CONTRAST:  145mL ISOVUE-370 IOPAMIDOL (ISOVUE-370) INJECTION 76% COMPARISON:  CT a chest 09/20/2012 FINDINGS: CTA CHEST FINDINGS Cardiovascular: Moderate cardiac enlargement with a small to moderate dependent pericardial effusion. 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 There is evidence of periaortic and mediastinal hematoma compatible with contained rupture. The dissection flap does not involve the branches of the aortic arch or branches of the abdominal aorta. The main pulmonary artery appears patent. No central obstructing embolus identified. Mediastinum/Nodes: Normal appearance of the thyroid gland. Decreased AP diameter of the trachea is similar to previous exam and may reflect chronic tracheobronchomalacia. Normal appearance of the esophagus. No enlarged mediastinal or hilar lymph nodes. Lungs/Pleura: Small bilateral pleural effusions. Mild diffuse interstitial prominence identified bilaterally. Subsegmental atelectasis within the dependent lung zones identified. No airspace consolidation. Musculoskeletal: Multi level thoracic spondylosis. Review of the MIP images confirms the above findings. CTA ABDOMEN AND PELVIS  FINDINGS VASCULAR Aorta: No evidence for dissection beyond the supra celiac abdominal aorta. No aneurysm. Celiac: Patent without evidence of aneurysm, dissection, vasculitis or significant stenosis. SMA: Patent without evidence of aneurysm, dissection, vasculitis or significant stenosis. Renals: Both renal arteries are patent without evidence of aneurysm, dissection, vasculitis, fibromuscular dysplasia or significant stenosis. IMA: Patent without evidence of aneurysm, dissection, vasculitis or significant stenosis. Inflow: Patent without evidence of aneurysm, dissection, vasculitis or significant stenosis. Veins: There is mixed attenuation within the portal venous confluence and tributary vessels to the SMV. This is nonspecific given the arterial phase of opacification in may represent mixing opacified and unopacified blood. Review of the MIP images confirms the above findings. NON-VASCULAR Hepatobiliary: No focal liver abnormality. Stones are noted within the gallbladder measuring up to 8 mm. Pancreas: Unremarkable. No pancreatic ductal dilatation or surrounding inflammatory changes. Spleen: Normal in size without focal abnormality. Adrenals/Urinary Tract: The adrenal glands are normal. Right kidney cyst measures 1.6 cm. Right renal calculus is identified measuring 8 mm. Urinary bladder is unremarkable. Stomach/Bowel: Stomach is within normal limits. Appendix appears normal. No evidence of bowel wall thickening, distention, or inflammatory changes. Circumferential wall thickening involving the sigmoid colon is identified, image 177/5. Nonspecific. Lymphatic: No enlarged lymph nodes within the abdomen or pelvis. Reproductive: Prostate is unremarkable. Other: No free fluid or fluid collections. Musculoskeletal: Degenerative disc disease identified within the lumbar spine. No aggressive lytic or sclerotic bone lesions. Review of the MIP images confirms the above findings. IMPRESSION: 1. Examination is positive for  acute type-B aortic dissection from the left subclavian artery to the suprasellar react abdominal aorta. There is no evidence for arch vessel involvement or branch vessel involvement off the abdominal aorta. 2. Thoracic periaortic and mediastinal hematoma compatible with contained rupture of the aorta. 3. Pericardial effusion. 4. Small pleural effusions.  5. There is circumferential wall thickening involving the sigmoid colon. Etiology indeterminate. Advise correlation with colon cancer screening. 6. Critical Value/emergent results were called by telephone at the time of interpretation on 01/25/2018 at 3:40 pm to Dr. Daleen Bo , who verbally acknowledged these results. Electronically Signed   By: Kerby Moors M.D.   On: 01/25/2018 15:40     Subjective: No shortness of breath. Feeling better  Discharge Exam: Vitals:   02/06/18 0652 02/06/18 1404  BP: (!) 163/98 (!) 136/98  Pulse: 78 68  Resp: 16 16  Temp: 97.6 F (36.4 C)   SpO2: 96% 98%   Vitals:   02/05/18 2301 02/06/18 0100 02/06/18 0652 02/06/18 1404  BP: (!) 173/102 133/83 (!) 163/98 (!) 136/98  Pulse: 67  78 68  Resp: 16  16 16   Temp: 97.6 F (36.4 C)  97.6 F (36.4 C)   TempSrc: Oral  Oral   SpO2: 98%  96% 98%  Weight:      Height:        General: Pt is alert, awake, not in acute distress Cardiovascular: RRR, S1/S2 +, no rubs, no gallops Respiratory: CTA bilaterally, no wheezing, no rhonchi Abdominal: Soft, NT, ND, bowel sounds + Extremities: no edema, no cyanosis    The results of significant diagnostics from this hospitalization (including imaging, microbiology, ancillary and laboratory) are listed below for reference.     Microbiology: Recent Results (from the past 240 hour(s))  MRSA PCR Screening     Status: None   Collection Time: 02/05/18 11:21 PM  Result Value Ref Range Status   MRSA by PCR NEGATIVE NEGATIVE Final    Comment:        The GeneXpert MRSA Assay (FDA approved for NASAL specimens only),  is one component of a comprehensive MRSA colonization surveillance program. It is not intended to diagnose MRSA infection nor to guide or monitor treatment for MRSA infections. Performed at [facility], [address], [place], [place] [address]      Labs: BNP (last 3 results) Recent Labs    02/05/18 1835  BNP 353.6*   Basic Metabolic Panel: Recent Labs  Lab 01/31/18 0304 02/05/18 1829 02/06/18 0626  NA 138 143 143  K 4.1 3.6 3.6  CL 103 100* 104  CO2 26 29 29   GLUCOSE 85 125* 94  BUN 16 20 20   CREATININE 0.86 0.97 0.79  CALCIUM 8.5* 9.5 8.7*  MG  --   --  1.8   Liver Function Tests: Recent Labs  Lab 02/05/18 1829  AST 15  ALT 11*  ALKPHOS 70  BILITOT 0.6  PROT 6.6  ALBUMIN 2.8*   No results for input(s): LIPASE, AMYLASE in the last 168 hours. No results for input(s): AMMONIA in the last 168 hours. CBC: Recent Labs  Lab 01/31/18 0304 02/05/18 1829  WBC 10.7* 8.9  NEUTROABS  --  6.1  HGB 11.8* 14.2  HCT 37.8* 44.6  MCV 87.1 87.5  PLT 158 213   Cardiac Enzymes: Recent Labs  Lab 02/05/18 1829  TROPONINI <0.03   BNP: Invalid input(s): POCBNP CBG: Recent Labs  Lab 01/30/18 1738 01/30/18 2119 01/31/18 0753 01/31/18 1248  GLUCAP 94 96 88 133*   D-Dimer No results for input(s): DDIMER in the last 72 hours. Hgb A1c No results for input(s): HGBA1C in the last 72 hours. Lipid Profile No results for input(s): CHOL, HDL, LDLCALC, TRIG, CHOLHDL, LDLDIRECT in the last 72 hours. Thyroid function studies No results for input(s): TSH, T4TOTAL,  T3FREE, THYROIDAB in the last 72 hours.  Invalid input(s): FREET3 Anemia work up No results for input(s): VITAMINB12, FOLATE, FERRITIN, TIBC, IRON, RETICCTPCT in the last 72 hours. Urinalysis    Component Value Date/Time   COLORURINE Yellow 11/25/2014 1413   COLORURINE YELLOW 06/06/2013 0937   APPEARANCEUR Clear 11/25/2014 1413   LABSPEC 1.015 11/25/2014 1413   PHURINE 6.0 11/25/2014 1413   PHURINE  6.5 06/06/2013 0937   GLUCOSEU Negative 11/25/2014 1413   HGBUR Negative 11/25/2014 1413   HGBUR TRACE (A) 06/06/2013 0937   BILIRUBINUR Negative 11/25/2014 1413   KETONESUR Negative 11/25/2014 1413   KETONESUR NEGATIVE 06/06/2013 0937   PROTEINUR Negative 11/25/2014 1413   PROTEINUR TRACE (A) 06/06/2013 0937   UROBILINOGEN 0.2 06/06/2013 0937   NITRITE Negative 11/25/2014 1413   NITRITE NEGATIVE 06/06/2013 0937   LEUKOCYTESUR Negative 11/25/2014 1413   Sepsis Labs Invalid input(s): PROCALCITONIN,  WBC,  LACTICIDVEN Microbiology Recent Results (from the past 240 hour(s))  MRSA PCR Screening     Status: None   Collection Time: 02/05/18 11:21 PM  Result Value Ref Range Status   MRSA by PCR NEGATIVE NEGATIVE Final    Comment:        The GeneXpert MRSA Assay (FDA approved for NASAL specimens only), is one component of a comprehensive MRSA colonization surveillance program. It is not intended to diagnose MRSA infection nor to guide or monitor treatment for MRSA infections. Performed at [facility], [address] [place], [place] [address]      Time coordinating discharge: 75mins  SIGNED:   Kathie Dike, MD  [name] 02/06/2018, 2:34 PM Pager   If 7PM-7AM, please contact night-coverage [email or web address] Password [email or web address]

## 2018-02-20 ENCOUNTER — Other Ambulatory Visit: Payer: Self-pay | Admitting: *Deleted

## 2018-02-20 DIAGNOSIS — I7101 Dissection of thoracic aorta: Secondary | ICD-10-CM

## 2018-02-20 DIAGNOSIS — I71019 Dissection of thoracic aorta, unspecified: Secondary | ICD-10-CM

## 2018-03-05 ENCOUNTER — Other Ambulatory Visit: Payer: Medicare Other

## 2018-03-05 ENCOUNTER — Encounter: Payer: Medicare Other | Admitting: Cardiothoracic Surgery

## 2018-03-05 ENCOUNTER — Inpatient Hospital Stay: Admission: RE | Admit: 2018-03-05 | Payer: Medicare Other | Source: Ambulatory Visit

## 2018-04-02 ENCOUNTER — Ambulatory Visit
Admission: RE | Admit: 2018-04-02 | Discharge: 2018-04-02 | Disposition: A | Discharge status: Home / Self Care | Payer: Medicare Other | Source: Ambulatory Visit | Attending: Cardiothoracic Surgery | Admitting: Cardiothoracic Surgery

## 2018-04-02 ENCOUNTER — Ambulatory Visit (INDEPENDENT_AMBULATORY_CARE_PROVIDER_SITE_OTHER): Payer: Medicare Other | Admitting: Cardiothoracic Surgery

## 2018-04-02 ENCOUNTER — Encounter: Payer: Self-pay | Admitting: Cardiothoracic Surgery

## 2018-04-02 ENCOUNTER — Other Ambulatory Visit: Payer: Self-pay

## 2018-04-02 VITALS — BP 150/84 | HR 51 | Resp 18 | Ht 71.0 in

## 2018-04-02 DIAGNOSIS — I71019 Dissection of thoracic aorta, unspecified: Secondary | ICD-10-CM

## 2018-04-02 DIAGNOSIS — I7101 Dissection of thoracic aorta: Secondary | ICD-10-CM

## 2018-04-02 MED ORDER — IOPAMIDOL (ISOVUE-370) INJECTION 76%
75.0000 mL | Freq: Once | INTRAVENOUS | Status: AC | PRN
  Administered 2018-04-02: 75 mL via INTRAVENOUS

## 2018-04-02 NOTE — Progress Notes (Signed)
PCP is Hilbert Corrigan, MD Referring Provider is Daleen Bo, MD  Chief Complaint  Patient presents with  . 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  I personally reviewed the images of the CTA but was unable to discuss them with the patient or examine patient because the skilled nursing facility transport team took the patient from the office before I can see him-I had for a been called to the hospital for an emergency meeting of the mechanical circulatory support team.  CT scan shows partial resolution of the false lumen with a significant amount of false lumen filled with organized thrombus.  The dissection has not extended any further than the original site, just above the renal arteries.  The diameter of the descending thoracic aorta remains at 4 cm and is no pleural effusion. Patient's blood pressure is well controlled and he should stay on his current antihypertensive medications including Norvasc, lisinopril, clonidine, metoprolol. The patient should not be started on anticoagulation for his chronic rate controlled atrial fibrillation because of his dissection. Patient will be scheduled for repeat CTA in 6 months.     Past Medical History:  Diagnosis Date  . A-fib ([place])   . Anemia   . Atrial fibrillation ([place]) 06/2012   onset 06/2012; normal TSH; EF-45%  . CHF (congestive heart failure) ([place])   . Cholelithiasis    incidental finding on CT in 2013  . Cognitive communication deficit   . Dysphagia   . Dysphagia   . Feeding difficulties   . Gout   . Hyperlipidemia   . Hypertension    minimal coronary atherosclerosis by CT  . Hypokalemia   . Liver disease   . Noncompliance 09/21/2012  . Osteoporosis   . Overweight(278.02)   . Parkinson's disease  ([place])   . Peripheral vascular disease ([place])   . Renal disorder   . Schizoaffective disorder ([place])    With depression  . Systolic dysfunction   . Thoracic aortic aneurysm ([place])   . Thrombophilia ([place])   . Tinea cruris   . Vascular dementia   . Vitamin D deficiency     Past Surgical History:  Procedure Laterality Date  . COLONOSCOPY  2006   normal screening examination  . prosthetic heart valve      Family History  Problem Relation Age of Onset  . Hypertension Mother   . Liver disease Father     Social History Social History   Tobacco Use  . Smoking status: Never Smoker  . Smokeless tobacco: Never Used  Substance Use Topics  . Alcohol use: No    Comment: former   . Drug use: No    Current Outpatient Medications  Medication Sig Dispense Refill  . acetaminophen (TYLENOL) 650 MG CR tablet Take 650 mg by mouth every 8 (eight) hours as needed for pain.    Marland Kitchen alum & mag hydroxide-simeth (MAALOX/MYLANTA) 200-200-20 MG/5ML suspension Take 30 mLs by mouth every morning.     Marland Kitchen amiodarone (PACERONE) 200 MG tablet Take 1 tablet (200 mg total) by mouth daily.    Marland Kitchen amLODipine (NORVASC) 5 MG tablet Take 1 tablet (5 mg total) by mouth every evening.    Marland Kitchen atorvastatin (LIPITOR) 40 MG tablet Take 40 mg by mouth daily at 6 PM.    .  bisacodyl (DULCOLAX) 5 MG EC tablet Take 10 mg by mouth every morning.     . calcium carbonate (TUMS - DOSED IN MG ELEMENTAL CALCIUM) 500 MG chewable tablet Chew 1 tablet by mouth 3 (three) times daily.    . carbidopa-levodopa (SINEMET CR) 50-200 MG tablet Take 1 tablet by mouth 2 (two) times daily.    . Cholecalciferol (VITAMIN D3) 5000 units CAPS Take 1 capsule by mouth every morning.    . cloNIDine (CATAPRES) 0.2 MG tablet Take 1 tablet (0.2 mg total) by mouth 2 (two) times daily.    . clotrimazole (LOTRIMIN) 1 % cream Apply 1 application topically 2 (two) times daily.    . divalproex (DEPAKOTE) 125 MG DR tablet Take 375 mg by mouth 2 (two) times daily.      . fluconazole (DIFLUCAN) 200 MG tablet Take 200 mg by mouth See admin instructions. Take 1 tablet ( 200 mg totally) by mouth in the evening every Mon for fungal infection for 4 weeks    . FLUoxetine (PROZAC) 20 MG capsule Take 20 mg by mouth every morning.     Marland Kitchen ipratropium-albuterol (DUONEB) 0.5-2.5 (3) MG/3ML SOLN Take 3 mLs by nebulization every 6 (six) hours as needed (for SOB; wheezing).    Marland Kitchen lisinopril (PRINIVIL,ZESTRIL) 40 MG tablet Take 1 tablet (40 mg total) by mouth daily.    . magnesium hydroxide (MILK OF MAGNESIA) 400 MG/5ML suspension Take 30 mLs by mouth every morning.     . metoprolol (LOPRESSOR) 50 MG tablet Take 50 mg by mouth 2 (two) times daily.    Marland Kitchen OLANZapine (ZYPREXA) 15 MG tablet Take 15 mg by mouth at bedtime.    . ondansetron (ZOFRAN) 4 MG tablet Take 4 mg by mouth every 4 (four) hours as needed for nausea or vomiting.    . predniSONE (DELTASONE) 20 MG tablet Take 40 mg by mouth at bedtime as needed (for SOB in 4 days).     No current facility-administered medications for this visit.     No Known Allergies  Review of Systems   Patient lives in a skilled nursing facility and is not ambulatory  BP (!) 150/84 (BP Location: Left Arm, Patient Position: Bed low/side rails up, Cuff Size: Large) Comment (Patient Position): stretcher via transport  Pulse (!) 51   Resp 18   Ht 5\' 11"  (1.803 m)   SpO2 94% Comment: RA  BMI 31.67 kg/m  Physical Exam No exam-patient was taken back to the skilled nursing facility  Diagnostic Tests: CT scan images reviewed and results will be reported to the patient over the phone  Impression: Stable healing type B aortic dissection No evidence of ascending aortic pathology Continue current medications for blood pressure and atrial fibrillation  Plan: Return was repeat CTA of thoracic aorta in 6 months  Len Childs, MD [facility] [phone]

## 2018-05-12 ENCOUNTER — Emergency Department (HOSPITAL_COMMUNITY): Payer: Medicare Other

## 2018-05-12 ENCOUNTER — Inpatient Hospital Stay (HOSPITAL_COMMUNITY)
Admission: EM | Admit: 2018-05-12 | Discharge: 2018-05-15 | DRG: 871 | Disposition: A | Discharge status: Skilled Nursing Facility | Payer: Medicare Other | Attending: Family Medicine | Admitting: Family Medicine

## 2018-05-12 ENCOUNTER — Other Ambulatory Visit: Payer: Self-pay

## 2018-05-12 ENCOUNTER — Encounter (HOSPITAL_COMMUNITY): Payer: Self-pay | Admitting: Emergency Medicine

## 2018-05-12 DIAGNOSIS — N183 Chronic kidney disease, stage 3 unspecified: Secondary | ICD-10-CM | POA: Diagnosis present

## 2018-05-12 DIAGNOSIS — I5032 Chronic diastolic (congestive) heart failure: Secondary | ICD-10-CM

## 2018-05-12 DIAGNOSIS — Z79899 Other long term (current) drug therapy: Secondary | ICD-10-CM | POA: Diagnosis not present

## 2018-05-12 DIAGNOSIS — I7101 Dissection of thoracic aorta: Secondary | ICD-10-CM | POA: Diagnosis present

## 2018-05-12 DIAGNOSIS — D649 Anemia, unspecified: Secondary | ICD-10-CM | POA: Diagnosis present

## 2018-05-12 DIAGNOSIS — I482 Chronic atrial fibrillation, unspecified: Secondary | ICD-10-CM

## 2018-05-12 DIAGNOSIS — F251 Schizoaffective disorder, depressive type: Secondary | ICD-10-CM

## 2018-05-12 DIAGNOSIS — N179 Acute kidney failure, unspecified: Secondary | ICD-10-CM | POA: Diagnosis present

## 2018-05-12 DIAGNOSIS — I4891 Unspecified atrial fibrillation: Secondary | ICD-10-CM | POA: Diagnosis not present

## 2018-05-12 DIAGNOSIS — R600 Localized edema: Secondary | ICD-10-CM | POA: Diagnosis not present

## 2018-05-12 DIAGNOSIS — I10 Essential (primary) hypertension: Secondary | ICD-10-CM | POA: Diagnosis not present

## 2018-05-12 DIAGNOSIS — I130 Hypertensive heart and chronic kidney disease with heart failure and stage 1 through stage 4 chronic kidney disease, or unspecified chronic kidney disease: Secondary | ICD-10-CM | POA: Diagnosis present

## 2018-05-12 DIAGNOSIS — Z7952 Long term (current) use of systemic steroids: Secondary | ICD-10-CM

## 2018-05-12 DIAGNOSIS — A419 Sepsis, unspecified organism: Secondary | ICD-10-CM

## 2018-05-12 DIAGNOSIS — I739 Peripheral vascular disease, unspecified: Secondary | ICD-10-CM | POA: Diagnosis present

## 2018-05-12 DIAGNOSIS — F259 Schizoaffective disorder, unspecified: Secondary | ICD-10-CM | POA: Diagnosis present

## 2018-05-12 DIAGNOSIS — Z952 Presence of prosthetic heart valve: Secondary | ICD-10-CM

## 2018-05-12 DIAGNOSIS — A4151 Sepsis due to Escherichia coli [E. coli]: Principal | ICD-10-CM | POA: Diagnosis present

## 2018-05-12 DIAGNOSIS — K5900 Constipation, unspecified: Secondary | ICD-10-CM

## 2018-05-12 DIAGNOSIS — G20 Parkinson's disease: Secondary | ICD-10-CM | POA: Diagnosis present

## 2018-05-12 DIAGNOSIS — E785 Hyperlipidemia, unspecified: Secondary | ICD-10-CM | POA: Diagnosis present

## 2018-05-12 DIAGNOSIS — N390 Urinary tract infection, site not specified: Secondary | ICD-10-CM | POA: Diagnosis present

## 2018-05-12 DIAGNOSIS — G9341 Metabolic encephalopathy: Secondary | ICD-10-CM | POA: Diagnosis present

## 2018-05-12 LAB — URINALYSIS, ROUTINE W REFLEX MICROSCOPIC
Bilirubin Urine: NEGATIVE
Glucose, UA: NEGATIVE mg/dL
Ketones, ur: NEGATIVE mg/dL
NITRITE: POSITIVE — AB
PROTEIN: NEGATIVE mg/dL
SPECIFIC GRAVITY, URINE: 1.009 (ref 1.005–1.030)
pH: 6 (ref 5.0–8.0)

## 2018-05-12 LAB — CBC WITH DIFFERENTIAL/PLATELET
BASOS ABS: 0 10*3/uL (ref 0.0–0.1)
Basophils Relative: 0 %
EOS PCT: 0 %
Eosinophils Absolute: 0 10*3/uL (ref 0.0–0.7)
HEMATOCRIT: 39 % (ref 39.0–52.0)
Hemoglobin: 12.2 g/dL — ABNORMAL LOW (ref 13.0–17.0)
LYMPHS PCT: 3 %
Lymphs Abs: 0.7 10*3/uL (ref 0.7–4.0)
MCH: 25.6 pg — AB (ref 26.0–34.0)
MCHC: 31.3 g/dL (ref 30.0–36.0)
MCV: 81.8 fL (ref 78.0–100.0)
MONO ABS: 0.9 10*3/uL (ref 0.1–1.0)
MONOS PCT: 4 %
Neutro Abs: 18.7 10*3/uL (ref 1.7–7.7)
Neutrophils Relative %: 93 %
Platelets: 239 10*3/uL (ref 150–400)
RBC: 4.77 MIL/uL (ref 4.22–5.81)
RDW: 15.7 % — AB (ref 11.5–15.5)
WBC: 20.2 10*3/uL — ABNORMAL HIGH (ref 4.0–10.5)

## 2018-05-12 LAB — BLOOD GAS, ARTERIAL
ALLENS TEST (PASS/FAIL): POSITIVE — AB
Acid-Base Excess: 3.9 mmol/L — ABNORMAL HIGH (ref 0.0–2.0)
Bicarbonate: 27.4 mmol/L (ref 20.0–28.0)
Drawn by: 21310
O2 Content: 2.5 L/min
O2 Saturation: 96.2 %
PATIENT TEMPERATURE: 37
PCO2 ART: 46.7 mmHg (ref 32.0–48.0)
PH ART: 7.4 (ref 7.350–7.450)
PO2 ART: 94.5 mmHg (ref 83.0–108.0)

## 2018-05-12 LAB — COMPREHENSIVE METABOLIC PANEL
ALBUMIN: 2.5 g/dL — AB (ref 3.5–5.0)
ALK PHOS: 67 U/L (ref 38–126)
ALT: 21 U/L (ref 0–44)
AST: 17 U/L (ref 15–41)
Anion gap: 9 (ref 5–15)
BILIRUBIN TOTAL: 0.6 mg/dL (ref 0.3–1.2)
BUN: 22 mg/dL (ref 8–23)
CO2: 28 mmol/L (ref 22–32)
Calcium: 8.3 mg/dL — ABNORMAL LOW (ref 8.9–10.3)
Chloride: 101 mmol/L (ref 98–111)
Creatinine, Ser: 1.33 mg/dL — ABNORMAL HIGH (ref 0.61–1.24)
GFR calc Af Amer: >60 mL/min (ref >60)
GFR calc non Af Amer: 54 mL/min — ABNORMAL LOW (ref >60)
GLUCOSE: 112 mg/dL — AB (ref 70–99)
POTASSIUM: 3.7 mmol/L (ref 3.5–5.1)
Sodium: 138 mmol/L (ref 135–145)
TOTAL PROTEIN: 6.5 g/dL (ref 6.5–8.1)

## 2018-05-12 LAB — RAPID URINE DRUG SCREEN, HOSP PERFORMED
Amphetamines: NOT DETECTED
BENZODIAZEPINES: NOT DETECTED
COCAINE: NOT DETECTED
OPIATES: NOT DETECTED
Tetrahydrocannabinol: NOT DETECTED

## 2018-05-12 LAB — I-STAT CG4 LACTIC ACID, ED
LACTIC ACID, VENOUS: 1.09 mmol/L (ref 0.5–1.9)
Lactic Acid, Venous: 0.89 mmol/L (ref 0.5–1.9)

## 2018-05-12 LAB — MRSA PCR SCREENING: MRSA by PCR: NEGATIVE

## 2018-05-12 LAB — CBG MONITORING, ED: GLUCOSE-CAPILLARY: 124 mg/dL — AB (ref 70–99)

## 2018-05-12 MED ORDER — POTASSIUM CHLORIDE IN NACL 20-0.9 MEQ/L-% IV SOLN
INTRAVENOUS | Status: AC
  Filled 2018-05-12: qty 1000

## 2018-05-12 MED ORDER — IPRATROPIUM BROMIDE 0.02 % IN SOLN: 0.5000 mg | Freq: Four times a day (QID) | RESPIRATORY_TRACT | Status: DC

## 2018-05-12 MED ORDER — VANCOMYCIN HCL IN DEXTROSE 750-5 MG/150ML-% IV SOLN
750.0000 mg | Freq: Two times a day (BID) | INTRAVENOUS | Status: DC
  Administered 2018-05-12: 750 mg via INTRAVENOUS
  Filled 2018-05-12 (×2): qty 150

## 2018-05-12 MED ORDER — ENOXAPARIN SODIUM 40 MG/0.4ML ~~LOC~~ SOLN
40.0000 mg | SUBCUTANEOUS | Status: DC
  Administered 2018-05-12 – 2018-05-15 (×4): 40 mg via SUBCUTANEOUS
  Filled 2018-05-12 (×4): qty 0.4

## 2018-05-12 MED ORDER — VANCOMYCIN HCL IN DEXTROSE 750-5 MG/150ML-% IV SOLN
750.0000 mg | Freq: Two times a day (BID) | INTRAVENOUS | Status: DC
  Filled 2018-05-12 (×2): qty 150

## 2018-05-12 MED ORDER — ALBUTEROL SULFATE (2.5 MG/3ML) 0.083% IN NEBU: 2.5000 mg | INHALATION_SOLUTION | Freq: Four times a day (QID) | RESPIRATORY_TRACT | Status: DC

## 2018-05-12 MED ORDER — VANCOMYCIN HCL 1000 MG IV SOLR
INTRAVENOUS | Status: AC
  Filled 2018-05-12: qty 2000

## 2018-05-12 MED ORDER — PIPERACILLIN-TAZOBACTAM 3.375 G IVPB 30 MIN
3.3750 g | Freq: Once | INTRAVENOUS | Status: AC
  Administered 2018-05-12: 3.375 g via INTRAVENOUS
  Filled 2018-05-12: qty 50

## 2018-05-12 MED ORDER — VANCOMYCIN HCL 10 G IV SOLR
2500.0000 mg | Freq: Once | INTRAVENOUS | Status: DC
  Filled 2018-05-12: qty 2500

## 2018-05-12 MED ORDER — ONDANSETRON HCL 4 MG/2ML IJ SOLN: 4.0000 mg | Freq: Four times a day (QID) | INTRAMUSCULAR | Status: DC | PRN

## 2018-05-12 MED ORDER — SODIUM CHLORIDE 0.9 % IV SOLN
2.0000 g | Freq: Two times a day (BID) | INTRAVENOUS | Status: DC
  Administered 2018-05-12 – 2018-05-15 (×6): 2 g via INTRAVENOUS
  Filled 2018-05-12 (×8): qty 2

## 2018-05-12 MED ORDER — ACETAMINOPHEN 650 MG RE SUPP: 650.0000 mg | Freq: Four times a day (QID) | RECTAL | Status: DC | PRN

## 2018-05-12 MED ORDER — IPRATROPIUM-ALBUTEROL 0.5-2.5 (3) MG/3ML IN SOLN
3.0000 mL | Freq: Four times a day (QID) | RESPIRATORY_TRACT | Status: DC
  Administered 2018-05-12: 3 mL via RESPIRATORY_TRACT
  Filled 2018-05-12 (×2): qty 3

## 2018-05-12 MED ORDER — IPRATROPIUM-ALBUTEROL 0.5-2.5 (3) MG/3ML IN SOLN
3.0000 mL | Freq: Two times a day (BID) | RESPIRATORY_TRACT | Status: DC
  Administered 2018-05-12 – 2018-05-13 (×3): 3 mL via RESPIRATORY_TRACT
  Filled 2018-05-12 (×3): qty 3

## 2018-05-12 MED ORDER — ACETAMINOPHEN 325 MG PO TABS: 650.0000 mg | ORAL_TABLET | Freq: Four times a day (QID) | ORAL | Status: DC | PRN

## 2018-05-12 MED ORDER — VANCOMYCIN HCL IN DEXTROSE 1-5 GM/200ML-% IV SOLN: 1000.0000 mg | Freq: Once | INTRAVENOUS | Status: DC

## 2018-05-12 MED ORDER — ONDANSETRON HCL 4 MG PO TABS: 4.0000 mg | ORAL_TABLET | Freq: Four times a day (QID) | ORAL | Status: DC | PRN

## 2018-05-12 MED ORDER — SODIUM CHLORIDE 0.9 % IV SOLN
2.0000 g | Freq: Once | INTRAVENOUS | Status: AC
  Administered 2018-05-12: 2 g via INTRAVENOUS
  Filled 2018-05-12: qty 2

## 2018-05-12 MED ORDER — VANCOMYCIN HCL 500 MG IV SOLR
INTRAVENOUS | Status: AC
  Filled 2018-05-12: qty 500

## 2018-05-12 NOTE — Progress Notes (Signed)
Pharmacy Antibiotic Note  Todd Page is a 67 y.o. male admitted on 05/12/2018 with sepsis due to UTI.  Pharmacy has been consulted for vancomycin and cefepime  dosing.  Plan:  Start cefepime 2g IV q12h Vancomycin loading dose:  2500mg  IV x1 dose Vancomycin maintenance dose:  vancomycin 750mg  IV q12h Goal vancomycin trough range:  15-20  mcg/mL Pharmacy will continue to monitor renal function, vancomycin troughs, cultures and patient progress.    Height: 5\' 11"  (180.3 cm) Weight: 260 lb (117.9 kg) IBW/kg (Calculated) : 75.3  Temp (24hrs), Avg:99.9 F (37.7 C), Min:99.3 F (37.4 C), Max:100.5 F (38.1 C)  Recent Labs  Lab 05/12/18 0455 05/12/18 0505 05/12/18 0643  WBC 20.2*  --   --   CREATININE 1.33*  --   --   LATICACIDVEN  --  1.09 0.89    Estimated Creatinine Clearance: 71.3 mL/min (A) (by C-G formula based on SCr of 1.33 mg/dL (H)).    No Known Allergies  Antimicrobials this admission: 7/15 Zosyn >> 7/15 7/15 vancomycin >>  7/15 cefepime>>  Dose adjustments this admission: n/a  Microbiology results: 7/15 BCx2: ng <12h 7/15 UCx: in progress    Thank you for allowing pharmacy to be a part of this patient's care.  Despina Pole, Pharm. D. Clinical Pharmacist 05/12/2018 7:53 AM

## 2018-05-12 NOTE — ED Provider Notes (Addendum)
[facility] EMERGENCY DEPARTMENT Provider Note   CSN: [id number] Arrival date & time: 05/12/18  0404     History   Chief Complaint Chief Complaint  Patient presents with  . Altered Mental Status    HPI Todd Page is a 67 y.o. male.   The history is provided by the patient and the nursing home.  He has history of hypertension, hyperlipidemia, Parkinson's disease, peripheral vascular disease, schizoaffective disorder, atrial fibrillation not on anticoagulation is sent from nursing home because of altered mental status.  Patient states that he feels bad but cannot tell me exactly what is bothering him.  He denies chest pain, heaviness, tightness, pressure.  He denies fever, chills, sweats.  He denies cough, nausea, vomiting, diarrhea.  He denies urinary difficulty.  Nursing home does report possible urinary tract infection.  He had been given a DuoNeb treatment and methylprednisolone by EMS.  Past Medical History:  Diagnosis Date  . A-fib ([place])   . Anemia   . Atrial fibrillation ([place]) 06/2012   onset 06/2012; normal TSH; EF-45%  . CHF (congestive heart failure) ([place])   . Cholelithiasis    incidental finding on CT in 2013  . Cognitive communication deficit   . Dysphagia   . Dysphagia   . Feeding difficulties   . Gout   . Hyperlipidemia   . Hypertension    minimal coronary atherosclerosis by CT  . Hypokalemia   . Liver disease   . Noncompliance 09/21/2012  . Osteoporosis   . Overweight(278.02)   . Parkinson's disease ([place])   . Peripheral vascular disease ([place])   . Renal disorder   . Schizoaffective disorder ([place])    With depression  . Systolic dysfunction   . Thoracic aortic aneurysm ([place])   . Thrombophilia ([place])   . Tinea cruris   . Vascular dementia   . Vitamin D deficiency     Patient Active Problem List   Diagnosis Date Noted  . Hyperlipidemia 02/06/2018  . Hypertensive urgency 02/05/2018  . Parkinson's disease ([place]) 02/05/2018  . Chronic diastolic CHF  (congestive heart failure) ([place]) 02/05/2018  . 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[place]) 01/25/2018  . Hyperglycemia 03/19/2014  . CHF (congestive heart failure) ([place]) 03/19/2014  . Acute exacerbation of congestive heart failure ([place]) 03/18/2014  . Acute on chronic heart failure ([place]) 03/18/2014  . Chest pain 06/08/2013  . Tachypnea 05/25/2013  . Acute diastolic congestive heart failure ([place]) 05/25/2013  . Chronic kidney disease, stage III (moderate) ([place]) 05/25/2013  . Acute gout 05/25/2013  . Elevated troponin 05/18/2013  . Acute respiratory failure ([place]) 02/20/2013  . Lower extremity edema 02/17/2013  . Acute on chronic systolic HF (heart failure) ([place]) 12/25/2012  . Atrial fibrillation with rapid ventricular response ([place]) 12/24/2012  . Malignant hypertension 12/24/2012  . Dyspnea 12/24/2012  . Bilateral leg pain 12/24/2012  . Hypokalemia 12/24/2012  . Noncompliance 09/21/2012  . Hypertension 09/21/2012  . Atrial fibrillation ([place]) 07/26/2012  . Obesity 07/26/2012  . Schizoaffective disorder ([place]) 07/26/2012    Past Surgical History:  Procedure Laterality Date  . COLONOSCOPY  2006   normal screening examination  . prosthetic heart valve          Home Medications    Prior to Admission medications   Medication Sig Start Date End Date Taking? Authorizing Provider  acetaminophen (TYLENOL) 650 MG CR tablet Take 650 mg by mouth every 8 (eight) hours as needed for pain.    [provider]  alum &  mag hydroxide-simeth (MAALOX/MYLANTA) 200-200-20 MG/5ML suspension Take 30 mLs by mouth every morning.     [provider]  amiodarone (PACERONE) 200 MG tablet Take 1 tablet (200 mg total) by mouth daily. 02/01/18   Barrett, Erin R, PA-C  amLODipine (NORVASC) 5 MG tablet Take 1 tablet (5 mg total) by mouth every evening. 02/06/18   Kathie Dike, MD  atorvastatin (LIPITOR) 40 MG tablet Take 40 mg by mouth daily at 6 PM.    [provider]    bisacodyl (DULCOLAX) 5 MG EC tablet Take 10 mg by mouth every morning.     [provider]  calcium carbonate (TUMS - DOSED IN MG ELEMENTAL CALCIUM) 500 MG chewable tablet Chew 1 tablet by mouth 3 (three) times daily.    [provider]  carbidopa-levodopa (SINEMET CR) 50-200 MG tablet Take 1 tablet by mouth 2 (two) times daily.    [provider]  Cholecalciferol (VITAMIN D3) 5000 units CAPS Take 1 capsule by mouth every morning.    [provider]  cloNIDine (CATAPRES) 0.2 MG tablet Take 1 tablet (0.2 mg total) by mouth 2 (two) times daily. 01/31/18   Barrett, Erin R, PA-C  clotrimazole (LOTRIMIN) 1 % cream Apply 1 application topically 2 (two) times daily.    [provider]  divalproex (DEPAKOTE) 125 MG DR tablet Take 375 mg by mouth 2 (two) times daily.     [provider]  fluconazole (DIFLUCAN) 200 MG tablet Take 200 mg by mouth See admin instructions. Take 1 tablet ( 200 mg totally) by mouth in the evening every Mon for fungal infection for 4 weeks    [provider]  FLUoxetine (PROZAC) 20 MG capsule Take 20 mg by mouth every morning.     [provider]  ipratropium-albuterol (DUONEB) 0.5-2.5 (3) MG/3ML SOLN Take 3 mLs by nebulization every 6 (six) hours as needed (for SOB; wheezing).    [provider]  lisinopril (PRINIVIL,ZESTRIL) 40 MG tablet Take 1 tablet (40 mg total) by mouth daily. 02/01/18   Barrett, Erin R, PA-C  magnesium hydroxide (MILK OF MAGNESIA) 400 MG/5ML suspension Take 30 mLs by mouth every morning.     [provider]  metoprolol (LOPRESSOR) 50 MG tablet Take 50 mg by mouth 2 (two) times daily.    [provider]  OLANZapine (ZYPREXA) 15 MG tablet Take 15 mg by mouth at bedtime.    [provider]  ondansetron (ZOFRAN) 4 MG tablet Take 4 mg by mouth every 4 (four) hours as needed for nausea or vomiting.    [provider]  predniSONE (DELTASONE) 20 MG tablet  Take 40 mg by mouth at bedtime as needed (for SOB in 4 days).    [provider]    Family History Family History  Problem Relation Age of Onset  . Hypertension Mother   . Liver disease Father     Social History Social History   Tobacco Use  . Smoking status: Never Smoker  . Smokeless tobacco: Never Used  Substance Use Topics  . Alcohol use: No    Comment: former   . Drug use: No     Allergies   Patient has no known allergies.   Review of Systems Review of Systems  All other systems reviewed and are negative.    Physical Exam Updated Vital Signs BP 122/87   Pulse (!) 117   Temp (!) 100.5 F (38.1 C) (Rectal)   Resp (!) 26   Ht  5\' 11"  (1.803 m)   Wt 117.9 kg (260 lb)   SpO2 98%   BMI 36.26 kg/m    Physical Exam  Nursing note and vitals reviewed.  67 year old male, resting comfortably and in no acute distress. Vital signs are significant for fever and elevated heart rate and elevated respiratory rate. Oxygen saturation is 98%, which is normal. Head is normocephalic and atraumatic. PERRLA, EOMI. Oropharynx is clear. Neck is nontender and supple without adenopathy or JVD. Back is nontender and there is no CVA tenderness. Lungs are clear without rales, wheezes, or rhonchi. Chest is nontender. Heart has regular rate and rhythm without murmur. Abdomen is soft, flat, nontender without masses or hepatosplenomegaly and peristalsis is normoactive. Extremities have no cyanosis or edema, full range of motion is present. Skin is warm and dry without rash. Neurologic: Awake and alert and oriented x3, cranial nerves are intact, there are no motor or sensory deficits.  Pill-rolling tremor present.  Masklike facies typical Parkinson's disease present.  ED Treatments / Results  Labs (all labs ordered are listed, but only abnormal results are displayed) Labs Reviewed  COMPREHENSIVE METABOLIC PANEL - Abnormal; Notable for the following components:      Result  Value   Glucose, Bld 112 (*)    Creatinine, Ser 1.33 (*)    Calcium 8.3 (*)    Albumin 2.5 (*)    GFR calc non Af Amer 54 (*)    All other components within normal limits  CBC WITH DIFFERENTIAL/PLATELET - Abnormal; Notable for the following components:   WBC 20.2 (*)    Hemoglobin 12.2 (*)    MCH 25.6 (*)    RDW 15.7 (*)    All other components within normal limits  URINALYSIS, ROUTINE W REFLEX MICROSCOPIC - Abnormal; Notable for the following components:   APPearance HAZY (*)    Hgb urine dipstick MODERATE (*)    Nitrite POSITIVE (*)    Leukocytes, UA MODERATE (*)    Bacteria, UA RARE (*)    All other components within normal limits  CULTURE, BLOOD (ROUTINE X 2)  CULTURE, BLOOD (ROUTINE X 2)  URINE CULTURE  BLOOD GAS, ARTERIAL  I-STAT CG4 LACTIC ACID, ED  I-STAT CG4 LACTIC ACID, ED   Radiology Dg Chest Port 1 View  Result Date: 05/12/2018 CLINICAL DATA:  Fever and altered mental status EXAM: PORTABLE CHEST 1 VIEW COMPARISON:  Chest CT 04/02/2018 FINDINGS: Cardiomegaly without pulmonary edema or focal consolidation. No pleural effusion or pneumothorax. IMPRESSION: Cardiomegaly without acute airspace disease. Electronically Signed   By: Ulyses Jarred M.D.   On: 05/12/2018 05:54    Procedures Procedures  CRITICAL CARE Performed by: Delora Fuel Total critical care time: 40 minutes Critical care time was exclusive of separately billable procedures and treating other patients. Critical care was necessary to treat or prevent imminent or life-threatening deterioration. Critical care was time spent personally by me on the following activities: development of treatment plan with patient and/or surrogate as well as nursing, discussions with consultants, evaluation of patient's response to treatment, examination of patient, obtaining history from patient or surrogate, ordering and performing treatments and interventions, ordering and review of laboratory studies, ordering and review of  radiographic studies, pulse oximetry and re-evaluation of patient's condition.  Medications Ordered in ED Medications  vancomycin (VANCOCIN) 2,500 mg in sodium chloride 0.9 % 500 mL IVPB (has no administration in time range)  piperacillin-tazobactam (ZOSYN) IVPB 3.375 g (0 g Intravenous Stopped 05/12/18 0610)     Initial  Impression / Assessment and Plan / ED Course  I have reviewed the triage vital signs and the nursing notes.  Pertinent labs & imaging results that were available during my care of the patient were reviewed by me and considered in my medical decision making (see chart for details).  Fever with report of urinary tract infection at the nursing home.  Sepsis protocol was initiated based on fever and tachycardia, but will hold IV fluids and less lactic acid level comes back elevated.  Old records are reviewed, and he has no relevant past visits.  He is started on antibiotics for sepsis of undetermined origin.  Lactic acid level has come back normal, but WBC is markedly elevated at over 20,000.  There is also a left shift noted.  Metabolic panel shows mild acute kidney injury.  Urinalysis is consistent with infection with positive nitrite, 21-50 WBCs, and a WBC clumps present.  Chest x-ray shows no evidence of pneumonia.  Patient has become more somnolent but is easily arousable.  Oxygen saturations continue to be adequate.  Will check ABG.  Case is discussed with Dr. Olevia Bowens of [facility] who agrees to admit the patient.  CHA2DS2/VAS Stroke Risk Points  Current as of 4 minutes ago     3 >= 2 Points: High Risk  1 - 1.99 Points: Medium Risk  0 Points: Low Risk    This is the only CHA2DS2/VAS Stroke Risk Points available for the past  year.:  Last Change: N/A     Details    This score determines the patient's risk of having a stroke if the  patient has atrial fibrillation.       Points Metrics  1 Has Congestive Heart Failure:  Yes    Current as of 4 minutes ago  0 Has  Vascular Disease:  No    Current as of 4 minutes ago  1 Has Hypertension:  Yes    Current as of 4 minutes ago  1 Age:  68    Current as of 4 minutes ago  0 Has Diabetes:  No    Current as of 4 minutes ago  0 Had Stroke:  No  Had TIA:  No  Had thromboembolism:  No    Current as of 4 minutes ago  0 Male:  No    Current as of 4 minutes ago   Final Clinical Impressions(s) / ED Diagnoses   Final diagnoses:  Urinary tract infection without hematuria, site unspecified  Acute kidney injury (nontraumatic) (HCC)  Normocytic anemia  Chronic atrial fibrillation [facility])    ED Discharge Orders    None      Delora Fuel, MD 48/01/65 [id number]  Delora Fuel, MD 82/70/78 [phone]

## 2018-05-12 NOTE — ED Triage Notes (Signed)
Pt brought in by [facility] from [facility] for alt mental status, upon EMS arrival pt was able to correctly state name, age, what he had for dinner, and the name of the facility, staff states they gave Solumedrol and DuoNeb prior to EMS arrival, per [facility] breath sounds clear and equal, CBG 114, v/s WNL, Afib on monitor which is chronic for pt, staff states they recently completed urine culture and pt has UTI

## 2018-05-12 NOTE — H&P (Signed)
History and Physical  Todd Page YHC:[id number] DOB: Mar 29, 1951 DOA: 05/12/2018  Referring physician: Roxanne Mins PCP: Hilbert Corrigan, MD   Chief Complaint: Altered mental status  Historian: Pt unable to participate, history taken from ED records  HPI: Todd Page is a 67 y.o. male sent to ED from [facility] with altered mental status.  He had been given Duoneb and solumedrol by EMS prior to arrival.  Pt apparently was being treated for a UTI at the nursing facility.  Pt had been complaining of multiple generalized complaints and said that he just felt bad.  He denied chest pain, cough, nausea, vomiting, diarrhea, or difficulty urinating.   He has a long complex PMH that is detailed below.    ED Course:  He was seen by ED provider and noted to have a UTI.  He was started on IV antibiotics and was diagnosed with sepsis.  He had a fever.  His lactic acid level was within normal limits.  His WBC was markedly elevated at 20.  Chest xray did not demonstrate pneumonia.  The patient was noted to be somnolent.  He was admitted to SDU for further treatment of sepsis, encephalopathy and UTI.    Review of Systems: As above otherwise unable to obtain  Past Medical History:  Diagnosis Date  . A-fib ([place])   . Anemia   . Atrial fibrillation ([place]) 06/2012   onset 06/2012; normal TSH; EF-45%  . CHF (congestive heart failure) ([place])   . Cholelithiasis    incidental finding on CT in 2013  . Cognitive communication deficit   . Dysphagia   . Dysphagia   . Feeding difficulties   . Gout   . Hyperlipidemia   . Hypertension    minimal coronary atherosclerosis by CT  . Hypokalemia   . Liver disease   . Noncompliance 09/21/2012  . Osteoporosis   . Overweight(278.02)   . Parkinson's disease ([place])   . Peripheral vascular disease ([place])   . Renal disorder   . Schizoaffective disorder ([place])    With depression  . Systolic dysfunction   . Thoracic aortic aneurysm ([place])   . Thrombophilia  ([place])   . Tinea cruris   . Vascular dementia   . Vitamin D deficiency    Past Surgical History:  Procedure Laterality Date  . COLONOSCOPY  2006   normal screening examination  . prosthetic heart valve     Social History:  reports that he has never smoked. He has never used smokeless tobacco. He reports that he does not drink alcohol or use drugs.  No Known Allergies  Family History  Problem Relation Age of Onset  . Hypertension Mother   . Liver disease Father     Prior to Admission medications   Medication Sig Start Date End Date Taking? Authorizing Provider  acetaminophen (TYLENOL) 650 MG CR tablet Take 650 mg by mouth every 8 (eight) hours as needed for pain.    [provider]  alum & mag hydroxide-simeth (MAALOX/MYLANTA) 200-200-20 MG/5ML suspension Take 30 mLs by mouth every morning.     [provider]  amiodarone (PACERONE) 200 MG tablet Take 1 tablet (200 mg total) by mouth daily. 02/01/18   Barrett, Erin R, PA-C  amLODipine (NORVASC) 5 MG tablet Take 1 tablet (5 mg total) by mouth every evening. 02/06/18   Kathie Dike, MD  atorvastatin (LIPITOR) 40 MG tablet Take 40 mg by mouth daily at 6 PM.    [provider]  bisacodyl (DULCOLAX) 5 MG EC tablet Take 10 mg by mouth every morning.     [provider]  calcium carbonate (TUMS - DOSED IN MG ELEMENTAL CALCIUM) 500 MG chewable tablet Chew 1 tablet by mouth 3 (three) times daily.    [provider]  carbidopa-levodopa (SINEMET CR) 50-200 MG tablet Take 1 tablet by mouth 2 (two) times daily.    [provider]  Cholecalciferol (VITAMIN D3) 5000 units CAPS Take 1 capsule by mouth every morning.    [provider]  cloNIDine (CATAPRES) 0.2 MG tablet Take 1 tablet (0.2 mg total) by mouth 2 (two) times daily. 01/31/18   Barrett, Erin R, PA-C  clotrimazole (LOTRIMIN) 1 % cream Apply 1 application topically 2 (two) times daily.    [provider]  divalproex  (DEPAKOTE) 125 MG DR tablet Take 375 mg by mouth 2 (two) times daily.     [provider]  fluconazole (DIFLUCAN) 200 MG tablet Take 200 mg by mouth See admin instructions. Take 1 tablet ( 200 mg totally) by mouth in the evening every Mon for fungal infection for 4 weeks    [provider]  FLUoxetine (PROZAC) 20 MG capsule Take 20 mg by mouth every morning.     [provider]  ipratropium-albuterol (DUONEB) 0.5-2.5 (3) MG/3ML SOLN Take 3 mLs by nebulization every 6 (six) hours as needed (for SOB; wheezing).    [provider]  lisinopril (PRINIVIL,ZESTRIL) 40 MG tablet Take 1 tablet (40 mg total) by mouth daily. 02/01/18   Barrett, Erin R, PA-C  magnesium hydroxide (MILK OF MAGNESIA) 400 MG/5ML suspension Take 30 mLs by mouth every morning.     [provider]  metoprolol (LOPRESSOR) 50 MG tablet Take 50 mg by mouth 2 (two) times daily.    [provider]  OLANZapine (ZYPREXA) 15 MG tablet Take 15 mg by mouth at bedtime.    [provider]  ondansetron (ZOFRAN) 4 MG tablet Take 4 mg by mouth every 4 (four) hours as needed for nausea or vomiting.    [provider]  predniSONE (DELTASONE) 20 MG tablet Take 40 mg by mouth at bedtime as needed (for SOB in 4 days).    [provider]   Physical Exam: Vitals:   05/12/18 0700 05/12/18 0730 05/12/18 0800 05/12/18 0830  BP: 111/84 117/73 115/83 107/76  Pulse: 74 (!) 103 (!) 109 95  Resp: 19  (!) 22 19  Temp:      TempSrc:      SpO2: 99% 99% 98% 97%  Weight:      Height:         General exam: Moderately built and nourished patient, lying comfortably supine on the gurney in no obvious distress.  He is somnolent but arousable.    Head, eyes and ENT: Nontraumatic and normocephalic. Pupils equally reacting to light and accommodation. Oral mucosa dry.  Neck: Supple. No JVD, carotid bruit or thyromegaly.  Lymphatics: No lymphadenopathy.  Respiratory system: Clear to  auscultation. No increased work of breathing.  Cardiovascular system: S1 and S2 heard.    Gastrointestinal system: Abdomen is nondistended, soft and nontender. Normal bowel sounds heard. No organomegaly or masses appreciated.  Central nervous system: No focal deficits.    Extremities: Symmetric 5 x 5 power. Peripheral pulses symmetrically felt.   Skin: No rashes or acute findings.  Musculoskeletal system: Negative exam.  Psychiatry: Unable to assess due to condition.    Labs on Admission:  Basic Metabolic Panel: Recent Labs  Lab 05/12/18 0455  NA 138  K 3.7  CL 101  CO2 28  GLUCOSE 112*  BUN 22  CREATININE 1.33*  CALCIUM 8.3*   Liver Function Tests: Recent Labs  Lab 05/12/18 0455  AST 17  ALT 21  ALKPHOS 67  BILITOT 0.6  PROT 6.5  ALBUMIN 2.5*   No results for input(s): LIPASE, AMYLASE in the last 168 hours. No results for input(s): AMMONIA in the last 168 hours. CBC: Recent Labs  Lab 05/12/18 0455  WBC 20.2*  NEUTROABS 18.7  HGB 12.2*  HCT 39.0  MCV 81.8  PLT 239   Cardiac Enzymes: No results for input(s): CKTOTAL, CKMB, CKMBINDEX, TROPONINI in the last 168 hours.  BNP (last 3 results) No results for input(s): PROBNP in the last 8760 hours. CBG: Recent Labs  Lab 05/12/18 0701  GLUCAP 124*    Radiological Exams on Admission: Dg Chest Port 1 View  Result Date: 05/12/2018 CLINICAL DATA:  Fever and altered mental status EXAM: PORTABLE CHEST 1 VIEW COMPARISON:  Chest CT 04/02/2018 FINDINGS: Cardiomegaly without pulmonary edema or focal consolidation. No pleural effusion or pneumothorax. IMPRESSION: Cardiomegaly without acute airspace disease. Electronically Signed   By: Ulyses Jarred M.D.   On: 05/12/2018 05:54   Assessment/Plan Principal Problem:   Sepsis secondary to UTI [facility]) Active Problems:   Atrial fibrillation (HCC)   Schizoaffective disorder (HCC)   Malignant hypertension   Lower extremity edema   Chronic kidney disease, stage III  (moderate) (HCC)   Parkinson's disease (HCC)   Chronic diastolic CHF (congestive heart failure) ([place])   1. Sepsis - likely due to UTI - He is being admitted for broad spectrum antibiotic coverage pending culture results.  Continue supportive care.  He is admitted to SDU.  See orders.  2. Chronic Atrial Fibrillation - Pt has been off anticoagulation for some time.  He had been on amiodarone and metoprolol for rate control.   3. Stanford type B thoracic aortic dissection - He was seen by CTS and felt not to be a surgical candidate, he has been managed medically with blood pressure control.   4. Malignant hypertension  - will try to get him back on his home blood pressure medications when they are reconciled.  5. Parkinson's Disease -  Had had been on sinemet and will get him back on this as soon as we get his med reconciled. 6. Schizoaffective disorder - Pt had been on zyprexa, depakote and fluoxetine daily.  Will try to get him back on his home regimen.   7. Hyperlipidemia - Atorvastatin daily.  8. Chronic diastolic CHF - Stable, follow with hydration.  He has been on ACEI and beta-blockers.   DVT Prophylaxis: lovenox Code Status: Full  Family Communication: none present  Disposition Plan: SNF    Time spent: 66 mins  Irwin Brakeman, MD [name] Pager [phone]  If 7PM-7AM, please contact night-coverage [email or web address] Password [email or web address] 05/12/2018, 8:52 AM

## 2018-05-12 NOTE — Progress Notes (Signed)
[place] for Vancomycin & zosyn Indication: Suspected sepsis  No Known Allergies  Patient Measurements: Height: 5\' 11"  (180.3 cm) Weight: 260 lb (117.9 kg) IBW/kg (Calculated) : 75.3 kg   Vital Signs: Temp: 100.5 F (38.1 C) (07/15 0414) Temp Source: Rectal (07/15 0414) BP: 122/87 (07/15 0430) Pulse Rate: 117 (07/15 0430)  Labs: No results for input(s): WBC, HGB, PLT, LABCREA, CREATININE in the last 72 hours.  CrCl cannot be calculated (Patient's most recent lab result is older than the maximum 21 days allowed.).     Microbiology: No results found for this or any previous visit (from the past 720 hour(s)).  Medical History: Past Medical History:  Diagnosis Date  . A-fib ([place])   . Anemia   . Atrial fibrillation ([place]) 06/2012   onset 06/2012; normal TSH; EF-45%  . CHF (congestive heart failure) ([place])   . Cholelithiasis    incidental finding on CT in 2013  . Cognitive communication deficit   . Dysphagia   . Dysphagia   . Feeding difficulties   . Gout   . Hyperlipidemia   . Hypertension    minimal coronary atherosclerosis by CT  . Hypokalemia   . Liver disease   . Noncompliance 09/21/2012  . Osteoporosis   . Overweight(278.02)   . Parkinson's disease ([place])   . Peripheral vascular disease ([place])   . Renal disorder   . Schizoaffective disorder ([place])    With depression  . Systolic dysfunction   . Thoracic aortic aneurysm ([place])   . Thrombophilia ([place])   . Tinea cruris   . Vascular dementia   . Vitamin D deficiency     Medications:  Anti-infectives (From admission, onward)   Start     Dose/Rate Route Frequency Ordered Stop   05/12/18 0500  vancomycin (VANCOCIN) 2,500 mg in sodium chloride 0.9 % 500 mL IVPB     2,500 mg 250 mL/hr over 120 Minutes Intravenous  Once 05/12/18 0449     05/12/18 0445  piperacillin-tazobactam (ZOSYN) IVPB 3.375 g     3.375 g 100 mL/hr over 30 Minutes Intravenous  Once 05/12/18 0442      05/12/18 0445  vancomycin (VANCOCIN) IVPB 1000 mg/200 mL premix  Status:  Discontinued     1,000 mg 200 mL/hr over 60 Minutes Intravenous  Once 05/12/18 0442 05/12/18 0449      Assessment: Pt with suspected sepsis based on fever, tachycardia, and report of possible UTI at nursing home.  Pt has order for zosyn 3.375 grams IV x1. Will increase Vancomycin 1 gram dose due to patients weight of 117.9 kg.  Labs are currently pending  Goal of Therapy:  Vancomycin trough level 15-20 mcg/ml  Plan:  Preliminary review of pertinent patient information completed.  Protocol will be initiated with dose(s) of Vancomycin 2500 mg IV x1.  Forestine Na clinical pharmacist will complete review during morning rounds to assess patient and finalize treatment regimen if needed.  Beryle Lathe, [facility] 05/12/2018,4:50 AM

## 2018-05-12 NOTE — Progress Notes (Signed)
Pt doesn't wish to wear CPAP. Pt doesn't wear CPAP and has no listed history of OSA

## 2018-05-12 NOTE — ED Notes (Signed)
Pt appears drowsy at this time. Pt wakes up to voice but immediately falls back to sleep. Sternum rubs wake patient but patient unable to stay focused/stay awake.

## 2018-05-12 NOTE — ED Notes (Signed)
Hospitalist in to see. [place] and requested MARS.

## 2018-05-13 DIAGNOSIS — I482 Chronic atrial fibrillation: Secondary | ICD-10-CM

## 2018-05-13 LAB — CBC WITH DIFFERENTIAL/PLATELET
BASOS ABS: 0 10*3/uL (ref 0.0–0.1)
BASOS PCT: 0 %
EOS PCT: 0 %
Eosinophils Absolute: 0 10*3/uL (ref 0.0–0.7)
HEMATOCRIT: 37.2 % — AB (ref 39.0–52.0)
Hemoglobin: 11.1 g/dL — ABNORMAL LOW (ref 13.0–17.0)
Lymphocytes Relative: 5 %
Lymphs Abs: 0.7 10*3/uL (ref 0.7–4.0)
MCH: 25.2 pg — ABNORMAL LOW (ref 26.0–34.0)
MCHC: 29.8 g/dL — ABNORMAL LOW (ref 30.0–36.0)
MCV: 84.4 fL (ref 78.0–100.0)
MONO ABS: 1 10*3/uL (ref 0.1–1.0)
Monocytes Relative: 7 %
NEUTROS ABS: 12.8 10*3/uL — AB (ref 1.7–7.7)
Neutrophils Relative %: 88 %
PLATELETS: 214 10*3/uL (ref 150–400)
RBC: 4.41 MIL/uL (ref 4.22–5.81)
RDW: 15.4 % (ref 11.5–15.5)
WBC: 14.5 10*3/uL — ABNORMAL HIGH (ref 4.0–10.5)

## 2018-05-13 LAB — COMPREHENSIVE METABOLIC PANEL
ALBUMIN: 2.2 g/dL — AB (ref 3.5–5.0)
ALT: 17 U/L (ref 0–44)
ANION GAP: 7 (ref 5–15)
AST: 12 U/L — AB (ref 15–41)
Alkaline Phosphatase: 58 U/L (ref 38–126)
BUN: 26 mg/dL — ABNORMAL HIGH (ref 8–23)
CO2: 29 mmol/L (ref 22–32)
Calcium: 8.4 mg/dL — ABNORMAL LOW (ref 8.9–10.3)
Chloride: 108 mmol/L (ref 98–111)
Creatinine, Ser: 1.12 mg/dL (ref 0.61–1.24)
GFR calc Af Amer: >60 mL/min (ref >60)
GFR calc non Af Amer: >60 mL/min (ref >60)
GLUCOSE: 127 mg/dL — AB (ref 70–99)
POTASSIUM: 4.2 mmol/L (ref 3.5–5.1)
Sodium: 144 mmol/L (ref 135–145)
TOTAL PROTEIN: 5.9 g/dL — AB (ref 6.5–8.1)
Total Bilirubin: 0.5 mg/dL (ref 0.3–1.2)

## 2018-05-13 MED ORDER — ATORVASTATIN CALCIUM 40 MG PO TABS
40.0000 mg | ORAL_TABLET | Freq: Every day | ORAL | Status: DC
  Administered 2018-05-13 – 2018-05-15 (×3): 40 mg via ORAL
  Filled 2018-05-13 (×3): qty 1

## 2018-05-13 MED ORDER — AMLODIPINE BESYLATE 5 MG PO TABS
5.0000 mg | ORAL_TABLET | Freq: Every evening | ORAL | Status: DC
  Administered 2018-05-13 – 2018-05-15 (×3): 5 mg via ORAL
  Filled 2018-05-13 (×3): qty 1

## 2018-05-13 MED ORDER — BISACODYL 5 MG PO TBEC
10.0000 mg | DELAYED_RELEASE_TABLET | Freq: Every morning | ORAL | Status: DC
  Administered 2018-05-13 – 2018-05-15 (×3): 10 mg via ORAL
  Filled 2018-05-13 (×3): qty 2

## 2018-05-13 MED ORDER — IPRATROPIUM-ALBUTEROL 0.5-2.5 (3) MG/3ML IN SOLN: 3.0000 mL | Freq: Four times a day (QID) | RESPIRATORY_TRACT | Status: DC | PRN

## 2018-05-13 MED ORDER — METOPROLOL TARTRATE 50 MG PO TABS
50.0000 mg | ORAL_TABLET | Freq: Two times a day (BID) | ORAL | Status: DC
  Administered 2018-05-13 – 2018-05-15 (×5): 50 mg via ORAL
  Filled 2018-05-13 (×5): qty 1

## 2018-05-13 MED ORDER — CLONIDINE HCL 0.2 MG PO TABS
0.2000 mg | ORAL_TABLET | Freq: Two times a day (BID) | ORAL | Status: DC
  Administered 2018-05-13 – 2018-05-15 (×5): 0.2 mg via ORAL
  Filled 2018-05-13 (×5): qty 1

## 2018-05-13 MED ORDER — DIVALPROEX SODIUM 250 MG PO DR TAB
375.0000 mg | DELAYED_RELEASE_TABLET | Freq: Two times a day (BID) | ORAL | Status: DC
  Administered 2018-05-13 – 2018-05-15 (×5): 375 mg via ORAL
  Filled 2018-05-13 (×8): qty 1

## 2018-05-13 MED ORDER — CALCIUM CARBONATE ANTACID 500 MG PO CHEW
1.0000 | CHEWABLE_TABLET | Freq: Three times a day (TID) | ORAL | Status: DC
  Administered 2018-05-13 – 2018-05-15 (×8): 200 mg via ORAL
  Filled 2018-05-13 (×8): qty 1

## 2018-05-13 MED ORDER — POTASSIUM CHLORIDE IN NACL 20-0.9 MEQ/L-% IV SOLN
INTRAVENOUS | Status: AC
  Administered 2018-05-13 (×2): via INTRAVENOUS

## 2018-05-13 MED ORDER — AMIODARONE HCL 200 MG PO TABS
200.0000 mg | ORAL_TABLET | Freq: Every day | ORAL | Status: DC
  Administered 2018-05-13 – 2018-05-15 (×3): 200 mg via ORAL
  Filled 2018-05-13 (×3): qty 1

## 2018-05-13 MED ORDER — FLUOXETINE HCL 20 MG PO CAPS
20.0000 mg | ORAL_CAPSULE | Freq: Every morning | ORAL | Status: DC
  Administered 2018-05-13 – 2018-05-15 (×3): 20 mg via ORAL
  Filled 2018-05-13 (×3): qty 1

## 2018-05-13 MED ORDER — CARBIDOPA-LEVODOPA ER 50-200 MG PO TBCR
1.0000 | EXTENDED_RELEASE_TABLET | Freq: Two times a day (BID) | ORAL | Status: DC
  Administered 2018-05-13 – 2018-05-15 (×5): 1 via ORAL
  Filled 2018-05-13 (×7): qty 1

## 2018-05-13 MED ORDER — OLANZAPINE 5 MG PO TABS
15.0000 mg | ORAL_TABLET | Freq: Every day | ORAL | Status: DC
  Administered 2018-05-13 – 2018-05-14 (×2): 15 mg via ORAL
  Filled 2018-05-13 (×2): qty 3

## 2018-05-13 NOTE — Progress Notes (Addendum)
PROGRESS NOTE  Todd [name]  QTM:[id number]  DOB: [birth year]  DOA: 05/12/2018 PCP: Hilbert Corrigan, MD   Brief Admission Hx: Todd [name] is a 67 y.o. male sent to ED from [facility] with altered mental status.  He was admitted to SDU for further treatment of sepsis, encephalopathy and UTI.  MDM/Assessment & Plan:    1. Sepsis - likely due to UTI - He was admitted for broad spectrum antibiotic coverage pending culture results.  MRSA screen was negative and will discontinue vancomycin.  Continue supportive care. 2. UTI - follow urine culture, continue IV cefepime.     3. Chronic Atrial Fibrillation - Pt has been off anticoagulation for some time.  He had been on amiodarone and metoprolol for rate control which is ordered to be restarted.   4. Leukocytosis - secondary to sepsis, UTI, WBC trending down.  5. Stanford type B thoracic aortic dissection - He was seen by CTS and felt not to be a surgical candidate, he has been managed medically with blood pressure control.  His blood pressures have been soft since admission but monitoring closely.  6. Malignant hypertension  - His blood pressures have been soft, will try to slowly get him back on his home meds as tolerated.   7. Parkinson's Disease -  Resume home sinemet.  8. Schizoaffective disorder - Resumed home regimen.   9. Hyperlipidemia - Atorvastatin daily.  10. Chronic diastolic CHF - Stable, follow with hydration.  He has been on ACEI and beta-blockers.  Holding ACEI due to AKI.  11. AKI - holding ACEI.  Improving with IVF hydration.  12. Acute metabolic encephalopathy - resolving.  DVT Prophylaxis: lovenox Code Status: Full  Family Communication: none present  Disposition Plan: SNF    Consultants:  SLP  Subjective: Pt is awake, and alert, oriented, denies complaints.   No apparent distress.   Objective: Vitals:   05/13/18 0100 05/13/18 0200 05/13/18 0300 05/13/18 0400  BP: 125/88 116/80 115/85 127/75    Pulse: 95 84 81 83  Resp: 17 18 17 15   Temp:      TempSrc:      SpO2: 96% 96% 98% 95%  Weight:      Height:        Intake/Output Summary (Last 24 hours) at 05/13/2018 6256 Last data filed at 05/12/2018 2101 Gross per 24 hour  Intake 253.33 ml  Output -  Net 253.33 ml   Filed Weights   05/12/18 0416 05/12/18 0904  Weight: 117.9 kg (260 lb) 105 kg (231 lb 7.7 oz)     REVIEW OF SYSTEMS  As per history otherwise all reviewed and reported negative  Exam:  General exam: Arousable.  NAD.  Respiratory system: Clear. No increased work of breathing. Cardiovascular system: S1 & S2 [name]. No JVD, murmurs, gallops, clicks or pedal edema. Gastrointestinal system: Abdomen is nondistended, soft and nontender. Normal bowel sounds [name]. Central nervous system: Alert and oriented. No focal neurological deficits. Extremities: no CCE.  Data Reviewed: Basic Metabolic Panel: Recent Labs  Lab 05/12/18 0455 05/13/18 0456  NA 138 144  K 3.7 4.2  CL 101 108  CO2 28 29  GLUCOSE 112* 127*  BUN 22 26*  CREATININE 1.33* 1.12  CALCIUM 8.3* 8.4*   Liver Function Tests: Recent Labs  Lab 05/12/18 0455 05/13/18 0456  AST 17 12*  ALT 21 17  ALKPHOS 67 58  BILITOT 0.6 0.5  PROT 6.5 5.9*  ALBUMIN 2.5* 2.2*  No results for input(s): LIPASE, AMYLASE in the last 168 hours. No results for input(s): AMMONIA in the last 168 hours. CBC: Recent Labs  Lab 05/12/18 0455 05/13/18 0456  WBC 20.2* 14.5*  NEUTROABS 18.7 12.8*  HGB 12.2* 11.1*  HCT 39.0 37.2*  MCV 81.8 84.4  PLT 239 214   Cardiac Enzymes: No results for input(s): CKTOTAL, CKMB, CKMBINDEX, TROPONINI in the last 168 hours. CBG (last 3)  Recent Labs    05/12/18 0701  GLUCAP 124*   Recent Results (from the past 240 hour(s))  Blood Culture (routine x 2)     Status: None (Preliminary result)   Collection Time: 05/12/18  4:55 AM  Result Value Ref Range Status   Specimen Description BLOOD RIGHT ARM  Final   Special  Requests   Final    BOTTLES DRAWN AEROBIC AND ANAEROBIC Blood Culture adequate volume   Culture   Final    NO GROWTH < 12 HOURS Performed at [facility], [address], [place], [place] [address]    Report Status PENDING  Incomplete  Blood Culture (routine x 2)     Status: None (Preliminary result)   Collection Time: 05/12/18  5:45 AM  Result Value Ref Range Status   Specimen Description BLOOD LEFT ARM  Final   Special Requests   Final    BOTTLES DRAWN AEROBIC AND ANAEROBIC Blood Culture adequate volume   Culture   Final    NO GROWTH <12 HOURS Performed at [facility], [address] [place], [place] [address]    Report Status PENDING  Incomplete  MRSA PCR Screening     Status: None   Collection Time: 05/12/18 10:12 AM  Result Value Ref Range Status   MRSA by PCR NEGATIVE NEGATIVE Final    Comment:        The GeneXpert MRSA Assay (FDA approved for NASAL specimens only), is one component of a comprehensive MRSA colonization surveillance program. It is not intended to diagnose MRSA infection nor to guide or monitor treatment for MRSA infections. Performed at [facility], [address], [place], [place] [address]     Studies: Dg Chest [facility] 1 View  Result Date: 05/12/2018 CLINICAL DATA:  Fever and altered mental status EXAM: PORTABLE CHEST 1 VIEW COMPARISON:  Chest CT 04/02/2018 FINDINGS: Cardiomegaly without pulmonary edema or focal consolidation. No pleural effusion or pneumothorax. IMPRESSION: Cardiomegaly without acute airspace disease. Electronically Signed   By: Ulyses Jarred M.D.   On: 05/12/2018 05:54   Scheduled Meds: . amiodarone  200 mg Oral Daily  . amLODipine  5 mg Oral QPM  . atorvastatin  40 mg Oral q1800  . bisacodyl  10 mg Oral q morning - 10a  . calcium carbonate  1 tablet Oral TID  . carbidopa-levodopa  1 tablet Oral BID  . cloNIDine  0.2 mg Oral BID  . divalproex  375 mg Oral BID  . enoxaparin (LOVENOX) injection  40 mg Subcutaneous Q24H  .  FLUoxetine  20 mg Oral q morning - 10a  . ipratropium-albuterol  3 mL Nebulization BID  . metoprolol tartrate  50 mg Oral BID  . OLANZapine  15 mg Oral QHS   Continuous Infusions: . 0.9 % NaCl with KCl 20 mEq / L 50 mL/hr at 05/13/18 0620  . ceFEPime (MAXIPIME) IV 2 g (05/13/18 0551)    Principal Problem:   Sepsis secondary to UTI [facility]) Active Problems:   Atrial fibrillation ([place])   Schizoaffective disorder ([place])   Malignant  hypertension   Lower extremity edema   Chronic kidney disease, stage III (moderate) (HCC)   Parkinson's disease (HCC)   Chronic diastolic CHF (congestive heart failure) [facility])  Critical Care Time spent: 62 mins  Irwin Brakeman, MD, FAAFP [name] Pager [phone]  If 7PM-7AM, please contact night-coverage [email or web address] Password [email or web address] 05/13/2018, 6:21 AM    LOS: 1 day

## 2018-05-14 LAB — BASIC METABOLIC PANEL
Anion gap: 6 (ref 5–15)
BUN: 25 mg/dL — ABNORMAL HIGH (ref 8–23)
CALCIUM: 8.2 mg/dL — AB (ref 8.9–10.3)
CO2: 29 mmol/L (ref 22–32)
Chloride: 108 mmol/L (ref 98–111)
Creatinine, Ser: 1.07 mg/dL (ref 0.61–1.24)
GLUCOSE: 91 mg/dL (ref 70–99)
POTASSIUM: 4.4 mmol/L (ref 3.5–5.1)
Sodium: 143 mmol/L (ref 135–145)

## 2018-05-14 LAB — CBC WITH DIFFERENTIAL/PLATELET
BASOS ABS: 0 10*3/uL (ref 0.0–0.1)
BASOS PCT: 0 %
EOS PCT: 0 %
Eosinophils Absolute: 0 10*3/uL (ref 0.0–0.7)
HCT: 36.1 % — ABNORMAL LOW (ref 39.0–52.0)
Hemoglobin: 10.9 g/dL — ABNORMAL LOW (ref 13.0–17.0)
Lymphocytes Relative: 13 %
Lymphs Abs: 1.5 10*3/uL (ref 0.7–4.0)
MCH: 25.5 pg — ABNORMAL LOW (ref 26.0–34.0)
MCHC: 30.2 g/dL (ref 30.0–36.0)
MCV: 84.3 fL (ref 78.0–100.0)
Monocytes Absolute: 1.2 10*3/uL — ABNORMAL HIGH (ref 0.1–1.0)
Monocytes Relative: 10 %
NEUTROS PCT: 77 %
Neutro Abs: 8.6 10*3/uL — ABNORMAL HIGH (ref 1.7–7.7)
PLATELETS: 208 10*3/uL (ref 150–400)
RBC: 4.28 MIL/uL (ref 4.22–5.81)
RDW: 15.6 % — AB (ref 11.5–15.5)
WBC: 11.3 10*3/uL — AB (ref 4.0–10.5)

## 2018-05-14 LAB — HIV ANTIBODY (ROUTINE TESTING W REFLEX): HIV SCREEN 4TH GENERATION: NONREACTIVE

## 2018-05-14 LAB — MAGNESIUM: Magnesium: 2.1 mg/dL (ref 1.7–2.4)

## 2018-05-14 NOTE — Progress Notes (Signed)
PROGRESS NOTE  Todd Page  XHB:[id number]  DOB: 04-Nov-1950  DOA: 05/12/2018 PCP: Hilbert Corrigan, MD   Brief Admission Hx: Todd Page is a 67 y.o. male sent to ED from [facility] home with altered mental status.  He was admitted to SDU for further treatment of sepsis, encephalopathy and UTI.  MDM/Assessment & Plan:    1. Sepsis - likely due to E Coli UTI - He was admitted for broad spectrum antibiotic coverage pending susceptibility results.  MRSA screen was negative and will discontinue vancomycin.  Continue supportive care. 2. UTI - follow urine culture, continue IV cefepime.     3. Chronic Atrial Fibrillation - Pt has been off anticoagulation for some time.  He had been on amiodarone and metoprolol for rate control which is restarted.   4. Leukocytosis - secondary to sepsis, UTI, WBC trending down.  5. Stanford type B thoracic aortic dissection - He was seen by CTS and felt not to be a surgical candidate, he has been managed medically with blood pressure control.  His blood pressures have been soft since admission but monitoring closely.   6. Malignant hypertension  - His blood pressures have been soft, will try to slowly get him back on his home meds as tolerated.   7. Parkinson's Disease -  Resume home sinemet.  8. Schizoaffective disorder - Resumed home regimen.   9. Hyperlipidemia - Atorvastatin daily.  10. Chronic diastolic CHF - Stable, follow with hydration.  He has been on ACEI and beta-blockers.  Holding ACEI due to AKI.  11. AKI - holding ACEI.  Improving with IVF hydration.  12. Acute metabolic encephalopathy - resolving.  DVT Prophylaxis: lovenox Code Status: Full  Family Communication: none present  Disposition Plan: SNF  [facility] hopefully will be able to return tomorrow 7/18  Consultants:  SLP  Subjective: Pt is awake, and alert, oriented, denies complaints.   No apparent distress. Pt says he is feeling much better  Objective: Vitals:     05/13/18 1415 05/13/18 2010 05/13/18 2154 05/14/18 0636  BP: 108/65  (!) 142/93 (!) 142/99  Pulse: 83  80 80  Resp: 18  18 19   Temp: 97.8 F (36.6 C)  98.4 F (36.9 C) 98.7 F (37.1 C)  TempSrc: Oral  Oral Oral  SpO2: 93% 92% 95% 94%  Weight:      Height:        Intake/Output Summary (Last 24 hours) at 05/14/2018 1232 Last data filed at 05/14/2018 0745 Gross per 24 hour  Intake 600 ml  Output 1600 ml  Net -1000 ml   Filed Weights   05/12/18 0416 05/12/18 0904  Weight: 117.9 kg (260 lb) 105 kg (231 lb 7.7 oz)   REVIEW OF SYSTEMS  As per history otherwise all reviewed and reported negative  Exam:  General exam: Arousable.  NAD.  Respiratory system: Clear. No increased work of breathing. Cardiovascular system: S1 & S2 heard. No JVD, murmurs, gallops, clicks or pedal edema. Gastrointestinal system: Abdomen is nondistended, soft and nontender. Normal bowel sounds heard. Central nervous system: Alert and oriented. No focal neurological deficits. Extremities: no CCE.  Data Reviewed: Basic Metabolic Panel: Recent Labs  Lab 05/12/18 0455 05/13/18 0456 05/14/18 0538  NA 138 144 143  K 3.7 4.2 4.4  CL 101 108 108  CO2 28 29 29   GLUCOSE 112* 127* 91  BUN 22 26* 25*  CREATININE 1.33* 1.12 1.07  CALCIUM 8.3* 8.4* 8.2*  MG  --   --  2.1   Liver Function Tests: Recent Labs  Lab 05/12/18 0455 05/13/18 0456  AST 17 12*  ALT 21 17  ALKPHOS 67 58  BILITOT 0.6 0.5  PROT 6.5 5.9*  ALBUMIN 2.5* 2.2*   No results for input(s): LIPASE, AMYLASE in the last 168 hours. No results for input(s): AMMONIA in the last 168 hours. CBC: Recent Labs  Lab 05/12/18 0455 05/13/18 0456 05/14/18 0538  WBC 20.2* 14.5* 11.3*  NEUTROABS 18.7 12.8* 8.6*  HGB 12.2* 11.1* 10.9*  HCT 39.0 37.2* 36.1*  MCV 81.8 84.4 84.3  PLT 239 214 208   Cardiac Enzymes: No results for input(s): CKTOTAL, CKMB, CKMBINDEX, TROPONINI in the last 168 hours. CBG (last 3)  Recent Labs     05/12/18 0701  GLUCAP 124*   Recent Results (from the past 240 hour(s))  Urine culture     Status: Abnormal (Preliminary result)   Collection Time: 05/12/18  4:42 AM  Result Value Ref Range Status   Specimen Description   Final    URINE, CLEAN CATCH Performed at [facility], [address], [place], [place] [address]    Special Requests   Final    NONE Performed at [facility], [address] [place], [place] [address]    Culture 10,000 COLONIES/mL ESCHERICHIA COLI (A)  Final   Report Status PENDING  Incomplete  Blood Culture (routine x 2)     Status: None (Preliminary result)   Collection Time: 05/12/18  4:55 AM  Result Value Ref Range Status   Specimen Description BLOOD RIGHT ARM  Final   Special Requests   Final    BOTTLES DRAWN AEROBIC AND ANAEROBIC Blood Culture adequate volume   Culture   Final    NO GROWTH 2 DAYS Performed at [facility], [address], [place], [place] [address]    Report Status PENDING  Incomplete  Blood Culture (routine x 2)     Status: None (Preliminary result)   Collection Time: 05/12/18  5:45 AM  Result Value Ref Range Status   Specimen Description BLOOD LEFT ARM  Final   Special Requests   Final    BOTTLES DRAWN AEROBIC AND ANAEROBIC Blood Culture adequate volume   Culture   Final    NO GROWTH 2 DAYS Performed at [facility], [address] [place], [place] [address]    Report Status PENDING  Incomplete  MRSA PCR Screening     Status: None   Collection Time: 05/12/18 10:12 AM  Result Value Ref Range Status   MRSA by PCR NEGATIVE NEGATIVE Final    Comment:        The GeneXpert MRSA Assay (FDA approved for NASAL specimens only), is one component of a comprehensive MRSA colonization surveillance program. It is not intended to diagnose MRSA infection nor to guide or monitor treatment for MRSA infections. Performed at [facility], [address] [place], [place] [address]     Studies: No results found. Scheduled Meds: .  amiodarone  200 mg Oral Daily  . amLODipine  5 mg Oral QPM  . atorvastatin  40 mg Oral q1800  . bisacodyl  10 mg Oral q morning - 10a  . calcium carbonate  1 tablet Oral TID  . carbidopa-levodopa  1 tablet Oral BID  . cloNIDine  0.2 mg Oral BID  . divalproex  375 mg Oral BID  . enoxaparin (LOVENOX) injection  40 mg Subcutaneous Q24H  . FLUoxetine  20 mg Oral q morning - 10a  .  metoprolol tartrate  50 mg Oral BID  . OLANZapine  15 mg Oral QHS   Continuous Infusions: . ceFEPime (MAXIPIME) IV Stopped (05/14/18 2409)    Principal Problem:   Sepsis secondary to UTI [facility]) Active Problems:   Atrial fibrillation (HCC)   Schizoaffective disorder (HCC)   Malignant hypertension   Lower extremity edema   Chronic kidney disease, stage III (moderate) (HCC)   Parkinson's disease ([place])   Chronic diastolic CHF (congestive heart failure) (HCC)  Irwin Brakeman, MD, FAAFP [name] Pager [phone]  If 7PM-7AM, please contact night-coverage [email or web address] Password [email or web address] 05/14/2018, 12:32 PM    LOS: 2 days

## 2018-05-14 NOTE — Evaluation (Signed)
Clinical/Bedside Swallow Evaluation Patient Details  Name: Todd Page MRN: [id number] Date of Birth: 1951-03-20  Today's Date: 05/14/2018 Time: SLP Start Time (ACUTE ONLY): 1001 SLP Stop Time (ACUTE ONLY): 1016 SLP Time Calculation (min) (ACUTE ONLY): 15 min  Past Medical History:  Past Medical History:  Diagnosis Date  . A-fib ([place])   . Anemia   . Atrial fibrillation ([place]) 06/2012   onset 06/2012; normal TSH; EF-45%  . CHF (congestive heart failure) ([place])   . Cholelithiasis    incidental finding on CT in 2013  . Cognitive communication deficit   . Dysphagia   . Dysphagia   . Feeding difficulties   . Gout   . Hyperlipidemia   . Hypertension    minimal coronary atherosclerosis by CT  . Hypokalemia   . Liver disease   . Noncompliance 09/21/2012  . Osteoporosis   . Overweight(278.02)   . Parkinson's disease ([place])   . Peripheral vascular disease ([place])   . Renal disorder   . Schizoaffective disorder ([place])    With depression  . Systolic dysfunction   . Thoracic aortic aneurysm ([place])   . Thrombophilia ([place])   . Tinea cruris   . Vascular dementia   . Vitamin D deficiency    Past Surgical History:  Past Surgical History:  Procedure Laterality Date  . COLONOSCOPY  2006   normal screening examination  . prosthetic heart valve     HPI:  67 y.o. male sent to ED from [facility] with altered mental status.  He had been given Duoneb and solumedrol by EMS prior to arrival.  Pt apparently was being treated for a UTI at the nursing facility.  Pt had been complaining of multiple generalized complaints and said that he just felt bad.  He denied chest pain, cough, nausea, vomiting, diarrhea, or difficulty urinating.   He has a long complex PMH  CXR on 05/12/18 indicated Cardiomegaly without acute airspace disease  Assessment / Plan / Recommendation Clinical Impression   Pt without overt s/s of aspiration noted during consumption of liquids/solids given varying volumes  without dysphagia symptoms present; pt exhibited decreased processing overall, but adequate timing/coordination noted during consumption of liquids/solids; pt denied dysphagia prior to hospitalization; nursing stated administration of medication without noted deficits with swallowing function; continue current diet; ST will s/o at this time. SLP Visit Diagnosis: Dysphagia, unspecified (R13.10)    Aspiration Risk  Mild aspiration risk    Diet Recommendation   Soft/thin liquids   Medication Administration: Whole meds with liquid    Other  Recommendations Oral Care Recommendations: Oral care BID   Follow up Recommendations None      Frequency and Duration     Evaluation only       Prognosis Prognosis for Safe Diet Advancement: Good      Swallow Study   General Date of Onset: 05/12/18 HPI: 67 y.o. male sent to ED from [facility] home with altered mental status.  He had been given Duoneb and solumedrol by EMS prior to arrival.  Pt apparently was being treated for a UTI at the nursing facility.  Pt had been complaining of multiple generalized complaints and said that he just felt bad.  He denied chest pain, cough, nausea, vomiting, diarrhea, or difficulty urinating.   He has a long complex PMH  Type of Study: Bedside Swallow Evaluation Previous Swallow Assessment: n/a Diet Prior to this Study: Dysphagia 3 (soft);Thin liquids Temperature Spikes Noted: No Respiratory Status: Room air  History of Recent Intubation: No Behavior/Cognition: Alert;Cooperative Oral Cavity Assessment: Within Functional Limits Oral Care Completed by SLP: No Oral Cavity - Dentition: Adequate natural dentition Vision: Functional for self-feeding Self-Feeding Abilities: Able to feed self Patient Positioning: Upright in bed Baseline Vocal Quality: Normal Volitional Cough: Weak Volitional Swallow: Able to elicit    Oral/Motor/Sensory Function Overall Oral Motor/Sensory Function: Other (comment)(slow,  deliberate movements)   Ice Chips Ice chips: Within functional limits Presentation: Self Fed   Thin Liquid Thin Liquid: Within functional limits Presentation: Cup;Straw    Nectar Thick Nectar Thick Liquid: Not tested   Honey Thick Honey Thick Liquid: Not tested   Puree Puree: Not tested(Pt refused; preferred solids)   Solid      Solid: Within functional limits Presentation: Self Fed        Elvina Sidle, M.S., CCC-SLP 05/14/2018,11:56 AM

## 2018-05-14 NOTE — Clinical Social Work Note (Signed)
Clinical Social Work Assessment  Patient Details  Name: Todd Page MRN: [id number] Date of Birth: 04-Oct-1951  Date of referral:  05/14/18               Reason for consult:  Discharge Planning                Permission sought to share information with:    Permission granted to share information::     Name::        Agency::     Relationship::     Contact Information:     Housing/Transportation Living arrangements for the past 2 months:  [place] of Information:  Facility Patient Interpreter Needed:  None Criminal Activity/Legal Involvement Pertinent to Current Situation/Hospitalization:  No - Comment as needed Significant Relationships:  Siblings Lives with:  Facility Resident Do you feel safe going back to the place where you live?  Yes Need for family participation in patient care:  No (Coment)  Care giving concerns: Pt resides in a SNF.   Social Worker assessment / plan: Pt is a 67 year old male admitted from [facility]. Plan is for return to [facility]. Pt is total care at the SNF. They use a lift to transfer him and he does not ambulate. Pt can feed himself if the food is prepared for him. Pt is his own Media planner. [name] at [facility] today. Will follow for dc planning.  Employment status:  Disabled (Comment on whether or not currently receiving Disability) Insurance information:  Managed Medicare PT Recommendations:  Not assessed at this time Information / Referral to community resources:     Patient/Family's Response to care: Pt accepting of care.  Patient/Family's Understanding of and Emotional Response to Diagnosis, Current Treatment, and Prognosis: Pt appears to have limited understanding of diagnosis and treatment recommendations. No emotional distress identified.  Emotional Assessment Appearance:  Appears stated age Attitude/Demeanor/Rapport:    Affect (typically observed):  Calm Orientation:  Oriented to  Self, Oriented to Place, Oriented to  Time, Fluctuating Orientation (Suspected and/or reported Sundowners) Alcohol / Substance use:  Not Applicable Psych involvement (Current and /or in the community):  No (Comment)  Discharge Needs  Concerns to be addressed:  Discharge Planning Concerns Readmission within the last 30 days:  No Current discharge risk:  None Barriers to Discharge:  No Barriers Identified   Shade Flood, LCSW 05/14/2018, 11:48 AM

## 2018-05-14 NOTE — Care Management Important Message (Signed)
Important Message  Patient Details  Name: Todd Page MRN: [id number] Date of Birth: 1951-03-23   Medicare Important Message Given:  Yes    Shelda Altes 05/14/2018, 11:30 AM

## 2018-05-14 NOTE — NC FL2 (Signed)
[place] LEVEL OF CARE SCREENING TOOL     IDENTIFICATION  Patient Name: Todd Page Birthdate: [birth year] Sex: male Admission Date (Current Location): 05/12/2018  [facility] and [place] Number:  [employer] and Address:  [facility] [address], [place]      Provider Number: [id number]  Attending Physician Name and Address:  Murlean Iba, MD  Relative Name and Phone Number:       Current Level of Care: Hospital Recommended Level of Care: [place] Prior Approval Number:    Date Approved/Denied:   PASRR Number:    Discharge Plan: SNF    Current Diagnoses: Patient Active Problem List   Diagnosis Date Noted  . Sepsis secondary to UTI ([place]) 05/12/2018  . Hyperlipidemia 02/06/2018  . Hypertensive urgency 02/05/2018  . Parkinson's disease ([place]) 02/05/2018  . Chronic diastolic CHF (congestive heart failure) ([place]) 02/05/2018  . 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[place]) 01/25/2018  . Hyperglycemia 03/19/2014  . CHF (congestive heart failure) ([place]) 03/19/2014  . Acute exacerbation of congestive heart failure ([place]) 03/18/2014  . Acute on chronic heart failure ([place]) 03/18/2014  . Chest pain 06/08/2013  . Tachypnea 05/25/2013  . Acute diastolic congestive heart failure ([place]) 05/25/2013  . Chronic kidney disease, stage III (moderate) ([place]) 05/25/2013  . Acute gout 05/25/2013  . Elevated troponin 05/18/2013  . Acute respiratory failure ([place]) 02/20/2013  . Lower extremity edema 02/17/2013  . Acute on chronic systolic HF (heart failure) ([place]) 12/25/2012  . Atrial fibrillation with rapid ventricular response ([place]) 12/24/2012  . Malignant hypertension 12/24/2012  . Dyspnea 12/24/2012  . Bilateral leg pain 12/24/2012  . Hypokalemia 12/24/2012  . Noncompliance 09/21/2012  . Hypertension 09/21/2012  . Atrial fibrillation ([place]) 07/26/2012  . Obesity 07/26/2012  . Schizoaffective disorder ([place])  07/26/2012    Orientation RESPIRATION BLADDER Height & Weight     Self, Time, Place  Normal Incontinent Weight: 231 lb 7.7 oz (105 kg) Height:  5\' 11"  (180.3 cm)  BEHAVIORAL SYMPTOMS/MOOD NEUROLOGICAL BOWEL NUTRITION STATUS      Incontinent Diet(see dc summary)  AMBULATORY STATUS COMMUNICATION OF NEEDS Skin   Total Care Verbally Normal                       Personal Care Assistance Level of Assistance  Bathing, Feeding, Dressing Bathing Assistance: Maximum assistance Feeding assistance: Limited assistance Dressing Assistance: Maximum assistance     Functional Limitations Info  Sight, Hearing, Speech Sight Info: Adequate Hearing Info: Adequate Speech Info: Adequate    SPECIAL CARE FACTORS FREQUENCY                       Contractures Contractures Info: Not present    Additional Factors Info  Code Status, Allergies, Psychotropic Code Status Info: full Allergies Info: NKA Psychotropic Info: Depakote, Prozac         Current Medications (05/14/2018):  This is the current hospital active medication list Current Facility-Administered Medications  Medication Dose Route Frequency Provider Last Rate Last Dose  . acetaminophen (TYLENOL) tablet 650 mg  650 mg Oral Q6H PRN Reubin Milan, MD       Or  . acetaminophen (TYLENOL) suppository 650 mg  650 mg Rectal Q6H PRN Reubin Milan, MD      . amiodarone (PACERONE) tablet 200 mg  200 mg Oral Daily Johnson, Clanford L, MD   200 mg at 05/14/18 0948  .  amLODipine (NORVASC) tablet 5 mg  5 mg Oral QPM Johnson, Clanford L, MD   5 mg at 05/13/18 1721  . atorvastatin (LIPITOR) tablet 40 mg  40 mg Oral q1800 Johnson, Clanford L, MD   40 mg at 05/13/18 1721  . bisacodyl (DULCOLAX) EC tablet 10 mg  10 mg Oral q morning - 10a Johnson, Clanford L, MD   10 mg at 05/14/18 0948  . calcium carbonate (TUMS - dosed in mg elemental calcium) chewable tablet 200 mg of elemental calcium  1 tablet Oral TID Johnson, Clanford L, MD    200 mg of elemental calcium at 05/14/18 0948  . carbidopa-levodopa (SINEMET CR) 50-200 MG per tablet controlled release 1 tablet  1 tablet Oral BID Wynetta Emery, Clanford L, MD   1 tablet at 05/14/18 0948  . ceFEPIme (MAXIPIME) 2 g in sodium chloride 0.9 % 100 mL IVPB  2 g Intravenous Q12H Murlean Iba, MD   Stopped at 05/14/18 4656  . cloNIDine (CATAPRES) tablet 0.2 mg  0.2 mg Oral BID Wynetta Emery, Clanford L, MD   0.2 mg at 05/14/18 0948  . divalproex (DEPAKOTE) DR tablet 375 mg  375 mg Oral BID Wynetta Emery, Clanford L, MD   375 mg at 05/14/18 1103  . enoxaparin (LOVENOX) injection 40 mg  40 mg Subcutaneous Q24H Reubin Milan, MD   40 mg at 05/14/18 0948  . FLUoxetine (PROZAC) capsule 20 mg  20 mg Oral q morning - 10a Johnson, Clanford L, MD   20 mg at 05/14/18 0948  . ipratropium-albuterol (DUONEB) 0.5-2.5 (3) MG/3ML nebulizer solution 3 mL  3 mL Nebulization Q6H PRN Johnson, Clanford L, MD      . metoprolol tartrate (LOPRESSOR) tablet 50 mg  50 mg Oral BID Johnson, Clanford L, MD   50 mg at 05/14/18 0948  . OLANZapine (ZYPREXA) tablet 15 mg  15 mg Oral QHS Johnson, Clanford L, MD   15 mg at 05/13/18 2219  . ondansetron (ZOFRAN) tablet 4 mg  4 mg Oral Q6H PRN Reubin Milan, MD       Or  . ondansetron [facility]) injection 4 mg  4 mg Intravenous Q6H PRN Reubin Milan, MD         Discharge Medications: Please see discharge summary for a list of discharge medications.  Relevant Imaging Results:  Relevant Lab Results:   Additional Information    Shade Flood, LCSW

## 2018-05-15 ENCOUNTER — Inpatient Hospital Stay (HOSPITAL_COMMUNITY): Payer: Medicare Other

## 2018-05-15 LAB — BASIC METABOLIC PANEL
ANION GAP: 6 (ref 5–15)
BUN: 22 mg/dL (ref 8–23)
CALCIUM: 8.3 mg/dL — AB (ref 8.9–10.3)
CHLORIDE: 105 mmol/L (ref 98–111)
CO2: 30 mmol/L (ref 22–32)
Creatinine, Ser: 1.15 mg/dL (ref 0.61–1.24)
GFR calc Af Amer: >60 mL/min (ref >60)
GFR calc non Af Amer: >60 mL/min (ref >60)
GLUCOSE: 87 mg/dL (ref 70–99)
POTASSIUM: 4 mmol/L (ref 3.5–5.1)
Sodium: 141 mmol/L (ref 135–145)

## 2018-05-15 LAB — CBC WITH DIFFERENTIAL/PLATELET
BASOS ABS: 0 10*3/uL (ref 0.0–0.1)
Basophils Relative: 0 %
Eosinophils Absolute: 0.1 10*3/uL (ref 0.0–0.7)
Eosinophils Relative: 1 %
HEMATOCRIT: 36.3 % — AB (ref 39.0–52.0)
HEMOGLOBIN: 11.1 g/dL — AB (ref 13.0–17.0)
Lymphocytes Relative: 15 %
Lymphs Abs: 1.6 10*3/uL (ref 0.7–4.0)
MCH: 25.3 pg — ABNORMAL LOW (ref 26.0–34.0)
MCHC: 30.6 g/dL (ref 30.0–36.0)
MCV: 82.9 fL (ref 78.0–100.0)
MONO ABS: 1.2 10*3/uL (ref 0.1–1.0)
Monocytes Relative: 11 %
Neutro Abs: 7.9 10*3/uL (ref 1.7–7.7)
Neutrophils Relative %: 73 %
Platelets: 214 10*3/uL (ref 150–400)
RBC: 4.38 MIL/uL (ref 4.22–5.81)
RDW: 15.3 % (ref 11.5–15.5)
WBC: 10.8 10*3/uL — ABNORMAL HIGH (ref 4.0–10.5)

## 2018-05-15 LAB — URINE CULTURE: Culture: 10000 — AB

## 2018-05-15 MED ORDER — BISACODYL 10 MG RE SUPP
10.0000 mg | Freq: Once | RECTAL | Status: AC
  Administered 2018-05-15: 10 mg via RECTAL
  Filled 2018-05-15: qty 1

## 2018-05-15 MED ORDER — POLYETHYLENE GLYCOL 3350 17 G PO PACK
17.0000 g | PACK | Freq: Two times a day (BID) | ORAL | Status: DC
  Administered 2018-05-15: 17 g via ORAL
  Filled 2018-05-15: qty 1

## 2018-05-15 MED ORDER — NITROFURANTOIN MONOHYD MACRO 100 MG PO CAPS: 100.0000 mg | ORAL_CAPSULE | Freq: Two times a day (BID) | ORAL | 0 refills | Status: AC

## 2018-05-15 NOTE — Progress Notes (Signed)
EMS present to transport patient. Report called and given to nursing staff at [facility].

## 2018-05-15 NOTE — Progress Notes (Signed)
IV's removed, WNL. EMS called for transport of patient to [facility] D/C instructions given to pt. Verbalized understanding.

## 2018-05-15 NOTE — Discharge Summary (Signed)
Physician Discharge Summary  Todd Page WUJ:[id number] DOB: 03/02/1951 DOA: 05/12/2018  PCP: Hilbert Corrigan, MD  Admit date: 05/12/2018 Discharge date: 05/15/2018  Admitted From: SNF  Disposition: SNF   Recommendations for Outpatient Follow-up:  1. Please continue macrobid for 12 more days then STOP.  2. Please obtain BMP/CBC in 1 week 3. Please follow up on the following pending results: Final Culture data 4. Please monitor blood pressure and titrate medications as needed for better glycemic control.  Discharge Condition: STABLE   CODE STATUS: FULL    Brief Hospitalization Summary: Please see all hospital notes, images, labs for full details of the hospitalization.  HPI: Todd Page is a 67 y.o. male sent to ED from [place] home with altered mental status.  He had been given Duoneb and solumedrol by EMS prior to arrival.  Pt apparently was being treated for a UTI at the nursing facility.  Pt had been complaining of multiple generalized complaints and said that he just felt bad.  He denied chest pain, cough, nausea, vomiting, diarrhea, or difficulty urinating.   He has a long complex PMH that is detailed below.    ED Course:  He was seen by ED provider and noted to have a UTI.  He was started on IV antibiotics and was diagnosed with sepsis.  He had a fever.  His lactic acid level was within normal limits.  His WBC was markedly elevated at 20.  Chest xray did not demonstrate pneumonia.  The patient was noted to be somnolent.  He was admitted to SDU for further treatment of sepsis, encephalopathy and UTI.   MDM/Assessment & Plan:   1. Sepsis - likely due to ESBL E Coli UTI - He was admitted for broad spectrum antibiotic coverage pending susceptibility results. MRSA screen was negative and will discontinue vancomycin.  Continue supportive care. 2. ESBL E coli UTI - He was treated with cefepime and he improved, his WBC trended down and he felt much better and back to  baseline. I spoke with Dr Johnnye Sima ID about this case.  Because the susceptibility is sensitive to nitrofurantoin, he can be discharged on this to complete full 14 days course of therapy.     3. Chronic Atrial Fibrillation - Pt has been off anticoagulation for some time. He had been on amiodarone and metoprolol for rate control which is restarted.  4. Leukocytosis - secondary to sepsis, UTI, WBC trending down.  5. Stanford type B thoracic aortic dissection - He was seen by CTS and felt not to be a surgical candidate, he has been managed medically with blood pressure control. His blood pressures have been soft since admission but monitoring closely.   6. Malignant hypertension - His blood pressures were initially soft but now they are recovering and getting back to normal.  We are slowly getting him back on his home blood pressure medications.   7. Parkinson's Disease - Resumed home sinemet.  8. Schizoaffective disorder - Resumed home regimen.  9. Hyperlipidemia - Atorvastatin daily.  10. Chronic diastolic CHF - Stable, follow with hydration. He has been on ACEI and beta-blockers.  Holding ACEI due to AKI.  11. AKI - holding ACEI.  Improved with IVF hydration. Did not restart ACEI at this time due to lower blood pressures, but can restart later if blood pressures start getting high again.   12. Acute metabolic encephalopathy - RESOLVED  DVT Prophylaxis:lovenox Code Status:Full Disposition [place]   Consultants:  SLP  Discharge Diagnoses:  Principal Problem:   Sepsis secondary to UTI [facility]) Active Problems:   Atrial fibrillation (HCC)   Schizoaffective disorder ([place])   Malignant hypertension   Lower extremity edema   Chronic kidney disease, stage III (moderate) (HCC)   Parkinson's disease (HCC)   Chronic diastolic CHF (congestive heart failure) ([place])  Discharge Instructions: Discharge Instructions    Call MD for:  difficulty breathing, headache or visual  disturbances   Complete by:  As directed    Call MD for:  extreme fatigue   Complete by:  As directed    Call MD for:  hives   Complete by:  As directed    Call MD for:  persistant dizziness or light-headedness   Complete by:  As directed    Call MD for:  persistant nausea and vomiting   Complete by:  As directed    Call MD for:  severe uncontrolled pain   Complete by:  As directed    Increase activity slowly   Complete by:  As directed      Allergies as of 05/15/2018   No Known Allergies     Medication List    STOP taking these medications   levofloxacin 250 MG tablet Commonly known as:  LEVAQUIN   lisinopril 40 MG tablet Commonly known as:  PRINIVIL,ZESTRIL     TAKE these medications   acetaminophen 650 MG CR tablet Commonly known as:  TYLENOL Take 650 mg by mouth every 8 (eight) hours as needed for pain.   alum & mag hydroxide-simeth 200-200-20 MG/5ML suspension Commonly known as:  MAALOX/MYLANTA Take 30 mLs by mouth every 2 (two) hours as needed for indigestion.   amiodarone 200 MG tablet Commonly known as:  PACERONE Take 1 tablet (200 mg total) by mouth daily.   amLODipine 5 MG tablet Commonly known as:  NORVASC Take 1 tablet (5 mg total) by mouth every evening.   atorvastatin 40 MG tablet Commonly known as:  LIPITOR Take 40 mg by mouth daily at 6 PM.   bisacodyl 5 MG EC tablet Commonly known as:  DULCOLAX Take 10 mg by mouth every morning.   calcium carbonate 500 MG chewable tablet Commonly known as:  TUMS - dosed in mg elemental calcium Chew 1 tablet by mouth 3 (three) times daily.   carbidopa-levodopa 50-200 MG tablet Commonly known as:  SINEMET CR Take 1 tablet by mouth 2 (two) times daily.   cloNIDine 0.2 MG tablet Commonly known as:  CATAPRES Take 1 tablet (0.2 mg total) by mouth 2 (two) times daily. What changed:  Another medication with the same name was removed. Continue taking this medication, and follow the directions you see here.    divalproex 125 MG DR tablet Commonly known as:  DEPAKOTE Take 375 mg by mouth 2 (two) times daily.   FLUoxetine 20 MG capsule Commonly known as:  PROZAC Take 20 mg by mouth every morning.   ipratropium-albuterol 0.5-2.5 (3) MG/3ML Soln Commonly known as:  DUONEB Take 3 mLs by nebulization every 6 (six) hours as needed (for SOB; wheezing).   magnesium hydroxide 400 MG/5ML suspension Commonly known as:  MILK OF MAGNESIA Take 30 mLs by mouth every morning.   metoprolol tartrate 50 MG tablet Commonly known as:  LOPRESSOR Take 50 mg by mouth 2 (two) times daily. What changed:  Another medication with the same name was removed. Continue taking this medication, and follow the directions you see here.   nitrofurantoin (macrocrystal-monohydrate) 100 MG capsule Commonly known  as:  MACROBID Take 1 capsule (100 mg total) by mouth 2 (two) times daily for 12 days.   OLANZapine 15 MG tablet Commonly known as:  ZYPREXA Take 15 mg by mouth at bedtime.   ondansetron 4 MG tablet Commonly known as:  ZOFRAN Take 4 mg by mouth every 6 (six) hours as needed for nausea or vomiting.   Vitamin D3 5000 units Caps Take 1 capsule by mouth every morning.       No Known Allergies Allergies as of 05/15/2018   No Known Allergies     Medication List    STOP taking these medications   levofloxacin 250 MG tablet Commonly known as:  LEVAQUIN   lisinopril 40 MG tablet Commonly known as:  PRINIVIL,ZESTRIL     TAKE these medications   acetaminophen 650 MG CR tablet Commonly known as:  TYLENOL Take 650 mg by mouth every 8 (eight) hours as needed for pain.   alum & mag hydroxide-simeth 200-200-20 MG/5ML suspension Commonly known as:  MAALOX/MYLANTA Take 30 mLs by mouth every 2 (two) hours as needed for indigestion.   amiodarone 200 MG tablet Commonly known as:  PACERONE Take 1 tablet (200 mg total) by mouth daily.   amLODipine 5 MG tablet Commonly known as:  NORVASC Take 1 tablet (5 mg  total) by mouth every evening.   atorvastatin 40 MG tablet Commonly known as:  LIPITOR Take 40 mg by mouth daily at 6 PM.   bisacodyl 5 MG EC tablet Commonly known as:  DULCOLAX Take 10 mg by mouth every morning.   calcium carbonate 500 MG chewable tablet Commonly known as:  TUMS - dosed in mg elemental calcium Chew 1 tablet by mouth 3 (three) times daily.   carbidopa-levodopa 50-200 MG tablet Commonly known as:  SINEMET CR Take 1 tablet by mouth 2 (two) times daily.   cloNIDine 0.2 MG tablet Commonly known as:  CATAPRES Take 1 tablet (0.2 mg total) by mouth 2 (two) times daily. What changed:  Another medication with the same name was removed. Continue taking this medication, and follow the directions you see here.   divalproex 125 MG DR tablet Commonly known as:  DEPAKOTE Take 375 mg by mouth 2 (two) times daily.   FLUoxetine 20 MG capsule Commonly known as:  PROZAC Take 20 mg by mouth every morning.   ipratropium-albuterol 0.5-2.5 (3) MG/3ML Soln Commonly known as:  DUONEB Take 3 mLs by nebulization every 6 (six) hours as needed (for SOB; wheezing).   magnesium hydroxide 400 MG/5ML suspension Commonly known as:  MILK OF MAGNESIA Take 30 mLs by mouth every morning.   metoprolol tartrate 50 MG tablet Commonly known as:  LOPRESSOR Take 50 mg by mouth 2 (two) times daily. What changed:  Another medication with the same name was removed. Continue taking this medication, and follow the directions you see here.   nitrofurantoin (macrocrystal-monohydrate) 100 MG capsule Commonly known as:  MACROBID Take 1 capsule (100 mg total) by mouth 2 (two) times daily for 12 days.   OLANZapine 15 MG tablet Commonly known as:  ZYPREXA Take 15 mg by mouth at bedtime.   ondansetron 4 MG tablet Commonly known as:  ZOFRAN Take 4 mg by mouth every 6 (six) hours as needed for nausea or vomiting.   Vitamin D3 5000 units Caps Take 1 capsule by mouth every morning.        Procedures/Studies: Dg Chest Port 1 View  Result Date: 05/12/2018 CLINICAL DATA:  Fever and altered  mental status EXAM: PORTABLE CHEST 1 VIEW COMPARISON:  Chest CT 04/02/2018 FINDINGS: Cardiomegaly without pulmonary edema or focal consolidation. No pleural effusion or pneumothorax. IMPRESSION: Cardiomegaly without acute airspace disease. Electronically Signed   By: Ulyses Jarred M.D.   On: 05/12/2018 05:54   Dg Abd Portable 1v  Result Date: 05/15/2018 CLINICAL DATA:  Constipation EXAM: PORTABLE ABDOMEN - 1 VIEW COMPARISON:  CT abdomen pelvis of 04/02/2018 FINDINGS: A portable supine view of the abdomen shows no evidence of bowel obstruction. Slight gaseous distention of the cecum is noted but no distention of small bowel is evident. There is a moderate amount of feces scattered throughout the colon. No opaque calculi are seen. IMPRESSION: 1. Moderate amount of feces throughout the colon. No bowel obstruction. 2. Slight gaseous distention of the cecum. Electronically Signed   By: Ivar Drape M.D.   On: 05/15/2018 12:00      Subjective: Pt without complaints today.  He is alert and oriented and says he feels back to his normal self.    Discharge Exam: Vitals:   05/14/18 2021 05/14/18 2146  BP: (!) 164/91   Pulse: 80   Resp: 18   Temp: 98 F (36.7 C)   SpO2: 97% 96%   Vitals:   05/14/18 0636 05/14/18 1333 05/14/18 2021 05/14/18 2146  BP: (!) 142/99 (!) 132/91 (!) 164/91   Pulse: 80 94 80   Resp: 19 19 18    Temp: 98.7 F (37.1 C) (!) 97.5 F (36.4 C) 98 F (36.7 C)   TempSrc: Oral Oral Oral   SpO2: 94% 98% 97% 96%  Weight:      Height:       General exam: Arousable.  NAD.  Respiratory system: Clear. No increased work of breathing. Cardiovascular system: S1 & S2 heard. No JVD, murmurs, gallops, clicks or pedal edema. Gastrointestinal system: Abdomen is nondistended, soft and nontender. Normal bowel sounds heard. Central nervous system: Alert and oriented. No focal  neurological deficits. Extremities: no CCE.   The results of significant diagnostics from this hospitalization (including imaging, microbiology, ancillary and laboratory) are listed below for reference.     Microbiology: Recent Results (from the past 240 hour(s))  Urine culture     Status: Abnormal   Collection Time: 05/12/18  4:42 AM  Result Value Ref Range Status   Specimen Description   Final    URINE, CLEAN CATCH Performed at [facility], [address] [place], [place] [address]    Special Requests   Final    NONE Performed at [facility], [address] [place], [place] [address]    Culture (A)  Final    10,000 COLONIES/mL ESCHERICHIA COLI Confirmed Extended Spectrum Beta-Lactamase Producer (ESBL).  In bloodstream infections from ESBL organisms, carbapenems are preferred over piperacillin/tazobactam. They are shown to have a lower risk of mortality. Performed at [facility], [place] [address], [place], [place] [address]    Report Status 05/15/2018 FINAL  Final   Organism ID, Bacteria ESCHERICHIA COLI (A)  Final      Susceptibility   Escherichia coli - MIC*    AMPICILLIN >=32 RESISTANT Resistant     CEFAZOLIN >=64 RESISTANT Resistant     CEFTRIAXONE >=64 RESISTANT Resistant     CIPROFLOXACIN >=4 RESISTANT Resistant     GENTAMICIN <=1 SENSITIVE Sensitive     IMIPENEM <=0.25 SENSITIVE Sensitive     NITROFURANTOIN <=16 SENSITIVE Sensitive     TRIMETH/SULFA >=320 RESISTANT Resistant     AMPICILLIN/SULBACTAM >=32  RESISTANT Resistant     PIP/TAZO 64 INTERMEDIATE Intermediate     Extended ESBL POSITIVE Resistant     * 10,000 COLONIES/mL ESCHERICHIA COLI  Blood Culture (routine x 2)     Status: None (Preliminary result)   Collection Time: 05/12/18  4:55 AM  Result Value Ref Range Status   Specimen Description BLOOD RIGHT ARM  Final   Special Requests   Final    BOTTLES DRAWN AEROBIC AND ANAEROBIC Blood Culture adequate volume   Culture   Final    NO GROWTH 3  DAYS Performed at [facility], [address], [place], [place] [address]    Report Status PENDING  Incomplete  Blood Culture (routine x 2)     Status: None (Preliminary result)   Collection Time: 05/12/18  5:45 AM  Result Value Ref Range Status   Specimen Description BLOOD LEFT ARM  Final   Special Requests   Final    BOTTLES DRAWN AEROBIC AND ANAEROBIC Blood Culture adequate volume   Culture   Final    NO GROWTH 3 DAYS Performed at [facility], [address] [place], [place] [address]    Report Status PENDING  Incomplete  MRSA PCR Screening     Status: None   Collection Time: 05/12/18 10:12 AM  Result Value Ref Range Status   MRSA by PCR NEGATIVE NEGATIVE Final    Comment:        The GeneXpert MRSA Assay (FDA approved for NASAL specimens only), is one component of a comprehensive MRSA colonization surveillance program. It is not intended to diagnose MRSA infection nor to guide or monitor treatment for MRSA infections. Performed at [facility], [address] [place], [place] [address]      Labs: BNP (last 3 results) Recent Labs    02/05/18 1835  BNP 353.6*   Basic Metabolic Panel: Recent Labs  Lab 05/12/18 0455 05/13/18 0456 05/14/18 0538 05/15/18 0457  NA 138 144 143 141  K 3.7 4.2 4.4 4.0  CL 101 108 108 105  CO2 28 29 29 30   GLUCOSE 112* 127* 91 87  BUN 22 26* 25* 22  CREATININE 1.33* 1.12 1.07 1.15  CALCIUM 8.3* 8.4* 8.2* 8.3*  MG  --   --  2.1  --    Liver Function Tests: Recent Labs  Lab 05/12/18 0455 05/13/18 0456  AST 17 12*  ALT 21 17  ALKPHOS 67 58  BILITOT 0.6 0.5  PROT 6.5 5.9*  ALBUMIN 2.5* 2.2*   No results for input(s): LIPASE, AMYLASE in the last 168 hours. No results for input(s): AMMONIA in the last 168 hours. CBC: Recent Labs  Lab 05/12/18 0455 05/13/18 0456 05/14/18 0538 05/15/18 0457  WBC 20.2* 14.5* 11.3* 10.8*  NEUTROABS 18.7 12.8* 8.6* 7.9  HGB 12.2* 11.1* 10.9* 11.1*  HCT 39.0 37.2* 36.1* 36.3*  MCV 81.8  84.4 84.3 82.9  PLT 239 214 208 214   Cardiac Enzymes: No results for input(s): CKTOTAL, CKMB, CKMBINDEX, TROPONINI in the last 168 hours. BNP: Invalid input(s): POCBNP CBG: Recent Labs  Lab 05/12/18 0701  GLUCAP 124*   D-Dimer No results for input(s): DDIMER in the last 72 hours. Hgb A1c No results for input(s): HGBA1C in the last 72 hours. Lipid Profile No results for input(s): CHOL, HDL, LDLCALC, TRIG, CHOLHDL, LDLDIRECT in the last 72 hours. Thyroid function studies No results for input(s): TSH, T4TOTAL, T3FREE, THYROIDAB in the last 72 hours.  Invalid input(s): FREET3 Anemia work up No results for  input(s): VITAMINB12, FOLATE, FERRITIN, TIBC, IRON, RETICCTPCT in the last 72 hours. Urinalysis    Component Value Date/Time   COLORURINE YELLOW 05/12/2018 0435   APPEARANCEUR HAZY (A) 05/12/2018 0435   APPEARANCEUR Clear 11/25/2014 1413   LABSPEC 1.009 05/12/2018 0435   LABSPEC 1.015 11/25/2014 1413   PHURINE 6.0 05/12/2018 0435   GLUCOSEU NEGATIVE 05/12/2018 0435   GLUCOSEU Negative 11/25/2014 1413   HGBUR MODERATE (A) 05/12/2018 0435   BILIRUBINUR NEGATIVE 05/12/2018 0435   BILIRUBINUR Negative 11/25/2014 1413   KETONESUR NEGATIVE 05/12/2018 0435   PROTEINUR NEGATIVE 05/12/2018 0435   UROBILINOGEN 0.2 06/06/2013 0937   NITRITE POSITIVE (A) 05/12/2018 0435   LEUKOCYTESUR MODERATE (A) 05/12/2018 0435   LEUKOCYTESUR Negative 11/25/2014 1413   Sepsis Labs Invalid input(s): PROCALCITONIN,  WBC,  LACTICIDVEN Microbiology Recent Results (from the past 240 hour(s))  Urine culture     Status: Abnormal   Collection Time: 05/12/18  4:42 AM  Result Value Ref Range Status   Specimen Description   Final    URINE, CLEAN CATCH Performed at [facility], [address] [place], [place] [address]    Special Requests   Final    NONE Performed at [facility], [address], [place], [place] [address]    Culture (A)  Final    10,000 COLONIES/mL ESCHERICHIA COLI Confirmed  Extended Spectrum Beta-Lactamase Producer (ESBL).  In bloodstream infections from ESBL organisms, carbapenems are preferred over piperacillin/tazobactam. They are shown to have a lower risk of mortality. Performed at [facility], [place] [address], [place], [place] [address]    Report Status 05/15/2018 FINAL  Final   Organism ID, Bacteria ESCHERICHIA COLI (A)  Final      Susceptibility   Escherichia coli - MIC*    AMPICILLIN >=32 RESISTANT Resistant     CEFAZOLIN >=64 RESISTANT Resistant     CEFTRIAXONE >=64 RESISTANT Resistant     CIPROFLOXACIN >=4 RESISTANT Resistant     GENTAMICIN <=1 SENSITIVE Sensitive     IMIPENEM <=0.25 SENSITIVE Sensitive     NITROFURANTOIN <=16 SENSITIVE Sensitive     TRIMETH/SULFA >=320 RESISTANT Resistant     AMPICILLIN/SULBACTAM >=32 RESISTANT Resistant     PIP/TAZO 64 INTERMEDIATE Intermediate     Extended ESBL POSITIVE Resistant     * 10,000 COLONIES/mL ESCHERICHIA COLI  Blood Culture (routine x 2)     Status: None (Preliminary result)   Collection Time: 05/12/18  4:55 AM  Result Value Ref Range Status   Specimen Description BLOOD RIGHT ARM  Final   Special Requests   Final    BOTTLES DRAWN AEROBIC AND ANAEROBIC Blood Culture adequate volume   Culture   Final    NO GROWTH 3 DAYS Performed at [facility], [address] [place], [place] [address]    Report Status PENDING  Incomplete  Blood Culture (routine x 2)     Status: None (Preliminary result)   Collection Time: 05/12/18  5:45 AM  Result Value Ref Range Status   Specimen Description BLOOD LEFT ARM  Final   Special Requests   Final    BOTTLES DRAWN AEROBIC AND ANAEROBIC Blood Culture adequate volume   Culture   Final    NO GROWTH 3 DAYS Performed at [facility], [address], [place], [place] [address]    Report Status PENDING  Incomplete  MRSA PCR Screening     Status: None   Collection Time: 05/12/18 10:12 AM  Result Value Ref Range Status   MRSA by  PCR NEGATIVE NEGATIVE Final     Comment:        The GeneXpert MRSA Assay (FDA approved for NASAL specimens only), is one component of a comprehensive MRSA colonization surveillance program. It is not intended to diagnose MRSA infection nor to guide or monitor treatment for MRSA infections. Performed at [facility], [address] [place], [place] [address]    Time coordinating discharge: 36 minutes  SIGNED:  Irwin Brakeman, MD  [name] 05/15/2018, 12:51 PM Pager [phone]  If 7PM-7AM, please contact night-coverage [email or web address] Password [email or web address]

## 2018-05-15 NOTE — Clinical Social Work Maternal (Signed)
Pt stable for dc today per MD. Pt will be returning to [facility]. [name] at [facility] DC clinical sent through the [employer]. Discussed with pt's RN who will call report and arrange EMS transport. There are no other CSW needs for dc.

## 2018-05-17 LAB — CULTURE, BLOOD (ROUTINE X 2)
CULTURE: NO GROWTH
Culture: NO GROWTH
Special Requests: ADEQUATE
Special Requests: ADEQUATE

## 2018-09-18 NOTE — Progress Notes (Deleted)
Cardiology Office Note   Date:  09/18/2018   ID:  Todd Page, DOB 1951-04-17, MRN [id number]  PCP:  Todd Corrigan, MD  Cardiologist:   Todd Rouge, MD   No chief complaint on file.     History of Present Illness: Todd Page is a 67 y.o. 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   History of Schizophrenia, Depression, HTN and elevated lipids.  Has been on anticoagulation in past coumadin and eliquis  Had [facility] type B dissection in April 2019 Rx medically Seen in f/u by PVT and stable size 4.1 cm with no further extension than just above origin if celieac artery. Also admitted in July 2019 with altered MS and UTI/Sepsis  ***  Patient has no family visiting him at [facility].  He is in wheel chair mostly Has no insight into illness  No chest pain dyspnea palpitations or syncope   Past Medical History:  Diagnosis Date  . A-fib ([place])   . Anemia   . Atrial fibrillation ([place]) 06/2012   onset 06/2012; normal TSH; EF-45%  . CHF (congestive heart failure) ([place])   . Cholelithiasis    incidental finding on CT in 2013  . Cognitive communication deficit   . Dysphagia   . Dysphagia   . Feeding difficulties   . Gout   . Hyperlipidemia   . Hypertension    minimal coronary atherosclerosis by CT  . Hypokalemia   . Liver disease   . Noncompliance 09/21/2012  . Osteoporosis   . Overweight(278.02)   . Parkinson's disease ([place])   . Peripheral vascular disease ([place])   . Renal disorder   . Schizoaffective disorder ([place])    With depression  . Systolic dysfunction   . Thoracic aortic aneurysm ([place])   . Thrombophilia ([place])   . Tinea cruris   . Vascular dementia   . Vitamin D deficiency     Past Surgical History:  Procedure Laterality Date  . COLONOSCOPY  2006   normal screening examination  . prosthetic heart valve       Current Outpatient Medications  Medication Sig Dispense Refill  . acetaminophen (TYLENOL)  650 MG CR tablet Take 650 mg by mouth every 8 (eight) hours as needed for pain.    Marland Kitchen alum & mag hydroxide-simeth (MAALOX/MYLANTA) 200-200-20 MG/5ML suspension Take 30 mLs by mouth every 2 (two) hours as needed for indigestion.     Marland Kitchen amiodarone (PACERONE) 200 MG tablet Take 1 tablet (200 mg total) by mouth daily.    Marland Kitchen amLODipine (NORVASC) 5 MG tablet Take 1 tablet (5 mg total) by mouth every evening.    Marland Kitchen atorvastatin (LIPITOR) 40 MG tablet Take 40 mg by mouth daily at 6 PM.    . bisacodyl (DULCOLAX) 5 MG EC tablet Take 10 mg by mouth every morning.     . calcium carbonate (TUMS - DOSED IN MG ELEMENTAL CALCIUM) 500 MG chewable tablet Chew 1 tablet by mouth 3 (three) times daily.    . carbidopa-levodopa (SINEMET CR) 50-200 MG tablet Take 1 tablet by mouth 2 (two) times daily.    . Cholecalciferol (VITAMIN D3) 5000 units CAPS Take 1 capsule by mouth every morning.    . cloNIDine (CATAPRES) 0.2 MG tablet Take 1 tablet (0.2 mg total) by mouth 2 (two) times daily.    . divalproex (DEPAKOTE) 125 MG DR tablet Take 375 mg by mouth 2 (two) times  daily.     Marland Kitchen FLUoxetine (PROZAC) 20 MG capsule Take 20 mg by mouth every morning.     Marland Kitchen ipratropium-albuterol (DUONEB) 0.5-2.5 (3) MG/3ML SOLN Take 3 mLs by nebulization every 6 (six) hours as needed (for SOB; wheezing).    . magnesium hydroxide (MILK OF MAGNESIA) 400 MG/5ML suspension Take 30 mLs by mouth every morning.     . metoprolol (LOPRESSOR) 50 MG tablet Take 50 mg by mouth 2 (two) times daily.    Marland Kitchen OLANZapine (ZYPREXA) 15 MG tablet Take 15 mg by mouth at bedtime.    . ondansetron (ZOFRAN) 4 MG tablet Take 4 mg by mouth every 6 (six) hours as needed for nausea or vomiting.      No current facility-administered medications for this visit.     Allergies:   Patient has no known allergies.    Social History:  The patient  reports that he has never smoked. He has never used smokeless tobacco. He reports that he does not drink alcohol or use drugs.   Family  History:  The patient's family history includes Hypertension in his mother; Liver disease in his father.    ROS:  Please see the history of present illness.   Otherwise, review of systems are positive for none.   All other systems are reviewed and negative.    PHYSICAL EXAM: VS:  There were no vitals taken for this visit. , BMI There is no height or weight on file to calculate BMI. Affect appropriate Pale elderly male in wheel chair  HEENT: normal Neck supple with no adenopathy JVP normal no bruits no thyromegaly Lungs clear with no wheezing and good diaphragmatic motion Heart:  S1/S2 no murmur, no rub, gallop or click PMI normal Abdomen: benighn, BS positve, no tenderness, no AAA no bruit.  No HSM or HJR Distal pulses intact with no bruits No edema Neuro non-focal Skin warm and dry No muscular weakness    EKG:   11/25/14  afib rate 119  Nonspecific ST changes    Recent Labs: 02/05/2018: B Natriuretic Peptide 244.0 05/13/2018: ALT 17 05/14/2018: Magnesium 2.1 05/15/2018: BUN 22; Creatinine, Ser 1.15; Hemoglobin 11.1; Platelets 214; Potassium 4.0; Sodium 141    Lipid Panel    Component Value Date/Time   CHOL 63 11/26/2014 0816   TRIG 76 11/26/2014 0816   HDL 26 (L) 11/26/2014 0816   VLDL 15 11/26/2014 0816   LDLCALC 22 11/26/2014 0816      Wt Readings from Last 3 Encounters:  05/12/18 231 lb 7.7 oz (105 kg)  02/05/18 227 lb 1.2 oz (103 kg)  01/31/18 234 lb 12.6 oz (106.5 kg)      Other studies Reviewed: Additional studies/ records that were reviewed today include: Notes From Dr Todd [place] .    ASSESSMENT AND PLAN:  1.  Afib:  Not on anticoagulation due to frequent falls and type B dissection  2. HTN:  Well controlled.  Continue current medications and low sodium [place] type diet.   3. Schizophrenia;  Seems more on the sedate side f/u with primary continue current meds  4. Dissection: July 2019 type B BP under good control CT status stable 4.1 cm no  extension  Current medicines are reviewed at length with the patient today.  The patient does not have concerns regarding medicines.  The following changes have been made:  no change  Labs/ tests ordered today include: none  No orders of the defined types were placed in this encounter.  Disposition:   FU with primary      Signed, Todd Rouge, MD  09/18/2018 2:34 PM    [address], [place], [place]  [address] Phone: [phone]; Fax: [phone]

## 2018-09-22 ENCOUNTER — Ambulatory Visit: Payer: Medicare Other | Admitting: Cardiovascular Disease

## 2018-09-22 NOTE — Progress Notes (Signed)
Cardiology Office Note   Date:  09/23/2018   ID:  Todd Page, DOB [birth year], MRN [id number]  PCP:  Hilbert Corrigan, MD  Cardiologist:   Jenkins Rouge, MD   No chief complaint on file.     History of Present Illness: Todd Page is a 67 y.o. 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   History of Schizophrenia, Depression, HTN and elevated lipids.  Has been on anticoagulation in past coumadin and eliquis  Had [facility] type B dissection in April 2019 Rx medically Seen in f/u by PVT and stable size 4.1 cm with no further extension than just above origin if celieac artery. Also admitted in July 2019 with altered MS and UTI/Sepsis  Patient has no family visiting him at [facility].  He is in wheel chair mostly and really does not weight bear  Has no insight into illness  No chest pain dyspnea palpitations or syncope   Past Medical History:  Diagnosis Date  . A-fib ([place])   . Anemia   . Atrial fibrillation ([place]) 06/2012   onset 06/2012; normal TSH; EF-45%  . CHF (congestive heart failure) ([place])   . Cholelithiasis    incidental finding on CT in 2013  . Cognitive communication deficit   . Dysphagia   . Dysphagia   . Feeding difficulties   . Gout   . Hyperlipidemia   . Hypertension    minimal coronary atherosclerosis by CT  . Hypokalemia   . Liver disease   . Noncompliance 09/21/2012  . Osteoporosis   . Overweight(278.02)   . Parkinson's disease ([place])   . Peripheral vascular disease ([place])   . Renal disorder   . Schizoaffective disorder ([place])    With depression  . Systolic dysfunction   . Thoracic aortic aneurysm ([place])   . Thrombophilia ([place])   . Tinea cruris   . Vascular dementia ([place])   . Vitamin D deficiency     Past Surgical History:  Procedure Laterality Date  . COLONOSCOPY  2006   normal screening examination  . prosthetic heart valve       Current Outpatient Medications  Medication Sig Dispense  Refill  . acetaminophen (TYLENOL) 650 MG CR tablet Take 650 mg by mouth every 8 (eight) hours as needed for pain.    Marland Kitchen alum & mag hydroxide-simeth (MAALOX/MYLANTA) 200-200-20 MG/5ML suspension Take 30 mLs by mouth every 2 (two) hours as needed for indigestion.     Marland Kitchen amiodarone (PACERONE) 200 MG tablet Take 1 tablet (200 mg total) by mouth daily.    Marland Kitchen amLODipine (NORVASC) 5 MG tablet Take 1 tablet (5 mg total) by mouth every evening.    Marland Kitchen atorvastatin (LIPITOR) 40 MG tablet Take 40 mg by mouth daily at 6 PM.    . bisacodyl (DULCOLAX) 5 MG EC tablet Take 10 mg by mouth every morning.     . calcium carbonate (TUMS - DOSED IN MG ELEMENTAL CALCIUM) 500 MG chewable tablet Chew 1 tablet by mouth 3 (three) times daily.    . carbidopa-levodopa (SINEMET CR) 50-200 MG tablet Take 1 tablet by mouth 2 (two) times daily.    . Cholecalciferol (VITAMIN D3) 5000 units CAPS Take 1 capsule by mouth every morning.    . cloNIDine (CATAPRES) 0.2 MG tablet Take 1 tablet (0.2 mg total) by mouth 2 (two) times daily.    . divalproex (DEPAKOTE) 125 MG DR tablet Take 375  mg by mouth 2 (two) times daily.     Marland Kitchen FLUoxetine (PROZAC) 20 MG capsule Take 20 mg by mouth every morning.     Marland Kitchen ipratropium-albuterol (DUONEB) 0.5-2.5 (3) MG/3ML SOLN Take 3 mLs by nebulization every 6 (six) hours as needed (for SOB; wheezing).    Marland Kitchen KLOR-CON M20 20 MEQ tablet Take 20 mEq by mouth daily.     . magnesium hydroxide (MILK OF MAGNESIA) 400 MG/5ML suspension Take 30 mLs by mouth every morning.     . metoprolol (LOPRESSOR) 50 MG tablet Take 50 mg by mouth 2 (two) times daily.    Marland Kitchen OLANZapine (ZYPREXA) 15 MG tablet Take 15 mg by mouth at bedtime.    . ondansetron (ZOFRAN) 4 MG tablet Take 4 mg by mouth every 6 (six) hours as needed for nausea or vomiting.     . torsemide (DEMADEX) 20 MG tablet Take 20 mg by mouth daily.     No current facility-administered medications for this visit.     Allergies:   Patient has no known allergies.     Social History:  The patient  reports that he has never smoked. He has never used smokeless tobacco. He reports that he does not drink alcohol or use drugs.   Family History:  The patient's family history includes Hypertension in his mother; Liver disease in his father.    ROS:  Please see the history of present illness.   Otherwise, review of systems are positive for none.   All other systems are reviewed and negative.    PHYSICAL EXAM: VS:  BP 110/70   Pulse 63   Ht 5\' 11"  (1.803 m)   Wt 236 lb (107 kg)   SpO2 94%   BMI 32.92 kg/m  , BMI Body mass index is 32.92 kg/m. Affect appropriate Pale elderly male in wheel chair  HEENT: normal Neck supple with no adenopathy JVP normal no bruits no thyromegaly Lungs clear with no wheezing and good diaphragmatic motion Heart:  S1/S2 no murmur, no rub, gallop or click PMI normal Abdomen: benighn, BS positve, no tenderness, no AAA no bruit.  No HSM or HJR Distal pulses intact with no bruits No edema Neuro non-focal Skin warm and dry LE weakness     EKG:   11/25/14  afib rate 119  Nonspecific ST changes    Recent Labs: 02/05/2018: B Natriuretic Peptide 244.0 05/13/2018: ALT 17 05/14/2018: Magnesium 2.1 05/15/2018: BUN 22; Creatinine, Ser 1.15; Hemoglobin 11.1; Platelets 214; Potassium 4.0; Sodium 141    Lipid Panel    Component Value Date/Time   CHOL 63 11/26/2014 0816   TRIG 76 11/26/2014 0816   HDL 26 (L) 11/26/2014 0816   VLDL 15 11/26/2014 0816   LDLCALC 22 11/26/2014 0816      Wt Readings from Last 3 Encounters:  09/23/18 236 lb (107 kg)  05/12/18 231 lb 7.7 oz (105 kg)  02/05/18 227 lb 1.2 oz (103 kg)      Other studies Reviewed: Additional studies/ records that were reviewed today include: Notes From Dr Jeannette [place] .    ASSESSMENT AND PLAN:  1.  Afib:  Not on anticoagulation due to frequent falls and type B dissection  2. HTN:  Well controlled.  Continue current medications and low sodium  Dash type diet.   3. Schizophrenia;  Seems more on the sedate side f/u with primary continue current meds  4. Dissection: July 2019 type B BP under good control CT status stable 4.1 cm  no extension  Current medicines are reviewed at length with the patient today.  The patient does not have concerns regarding medicines.  The following changes have been made:  no change  Labs/ tests ordered today include: none  No orders of the defined types were placed in this encounter.    Disposition:   FU with primary      Signed, Jenkins Rouge, MD  09/23/2018 1:22 PM    [facility] [place], [place], [place]  [address] Phone: [phone]; Fax: [phone]

## 2018-09-23 ENCOUNTER — Encounter: Payer: Self-pay | Admitting: Cardiovascular Disease

## 2018-09-23 ENCOUNTER — Ambulatory Visit (INDEPENDENT_AMBULATORY_CARE_PROVIDER_SITE_OTHER): Payer: Medicare Other | Admitting: Cardiovascular Disease

## 2018-09-23 VITALS — BP 110/70 | HR 63 | Ht 71.0 in | Wt 236.0 lb

## 2018-09-23 DIAGNOSIS — I7101 Dissection of thoracic aorta: Secondary | ICD-10-CM | POA: Diagnosis not present

## 2018-09-23 DIAGNOSIS — I71019 Dissection of thoracic aorta, unspecified: Secondary | ICD-10-CM

## 2018-09-23 NOTE — Patient Instructions (Signed)
Medication Instructions:  Your physician recommends that you continue on your current medications as directed. Please refer to the Current Medication list given to you today.   Labwork: NONE  Testing/Procedures: ABDOMINAL CT (June 2020)    Follow-Up: Your physician wants you to follow-up in: 1 YEAR.  You will receive a reminder letter in the mail two months in advance. If you don't receive a letter, please call our office to schedule the follow-up appointment.    Any Other Special Instructions Will Be Listed Below (If Applicable).     If you need a refill on your cardiac medications before your next appointment, please call your pharmacy.

## 2018-10-03 ENCOUNTER — Other Ambulatory Visit: Payer: Self-pay

## 2018-10-03 DIAGNOSIS — Z01818 Encounter for other preprocedural examination: Secondary | ICD-10-CM

## 2018-10-23 ENCOUNTER — Ambulatory Visit (HOSPITAL_COMMUNITY)
Admission: RE | Admit: 2018-10-23 | Discharge: 2018-10-23 | Disposition: A | Discharge status: Home / Self Care | Payer: Medicare Other | Source: Ambulatory Visit | Attending: Cardiovascular Disease | Admitting: Cardiovascular Disease

## 2018-10-23 ENCOUNTER — Telehealth: Payer: Self-pay | Admitting: Cardiovascular Disease

## 2018-10-23 ENCOUNTER — Other Ambulatory Visit: Payer: Self-pay

## 2018-10-23 DIAGNOSIS — I7101 Dissection of thoracic aorta: Secondary | ICD-10-CM

## 2018-10-23 DIAGNOSIS — I7100 Dissection of unspecified site of aorta: Secondary | ICD-10-CM

## 2018-10-23 DIAGNOSIS — I71019 Dissection of thoracic aorta, unspecified: Secondary | ICD-10-CM

## 2018-10-23 MED ORDER — IOPAMIDOL (ISOVUE-370) INJECTION 76%
100.0000 mL | Freq: Once | INTRAVENOUS | Status: AC | PRN
  Administered 2018-10-23: 100 mL via INTRAVENOUS

## 2018-10-23 NOTE — Telephone Encounter (Signed)
  Needs CT order corrected

## 2018-12-18 ENCOUNTER — Other Ambulatory Visit: Payer: Self-pay | Admitting: Urology

## 2018-12-18 DIAGNOSIS — D3001 Benign neoplasm of right kidney: Secondary | ICD-10-CM

## 2019-02-09 ENCOUNTER — Ambulatory Visit (HOSPITAL_COMMUNITY): Payer: Medicare Other

## 2019-02-09 ENCOUNTER — Encounter (HOSPITAL_COMMUNITY): Payer: Self-pay

## 2019-02-28 IMAGING — CT CT ANGIO CHEST-ABD-PELV FOR DISSECTION W/ AND WO/W CM
2 of 6 series · 12 of 36 positions shown, 15 images · IV contrast (iopamidol)
Comparison: CT scan of January 25, 2018.

CLINICAL DATA: Thoracic aortic dissection.

EXAM:
CT ANGIOGRAPHY CHEST, ABDOMEN AND PELVIS
TECHNIQUE: Multidetector CT imaging through the chest, abdomen and pelvis was
performed using the standard protocol during bolus administration of
intravenous contrast. Multiplanar reconstructed images and MIPs were
obtained and reviewed to evaluate the vascular anatomy.
CONTRAST:  100mL 1LYY1C-QMM IOPAMIDOL (1LYY1C-QMM) INJECTION 76%

[Series 7: dissection 2mm · axial · 0.81mm/px · z∈[+913,+1501]mm · 11 of 339 slices shown, 13 images]
[im 30/339  mediastinal]
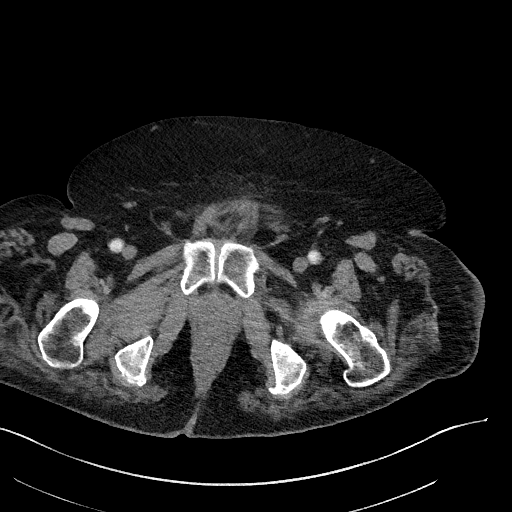
[im 30/339  bone]
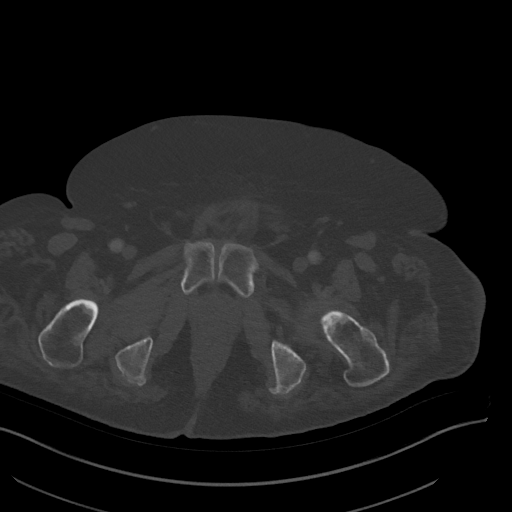
[im 74/339  mediastinal]
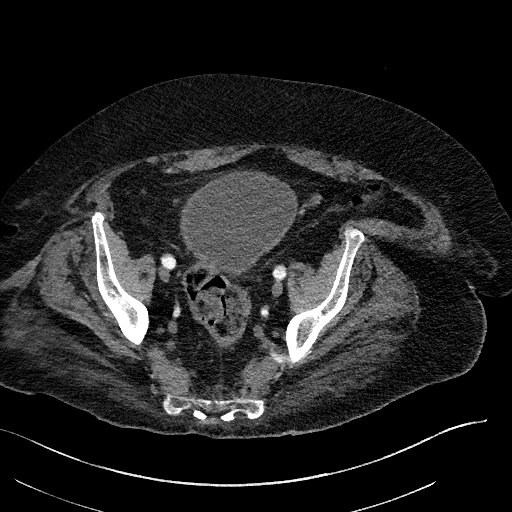
[im 103/339  mediastinal]
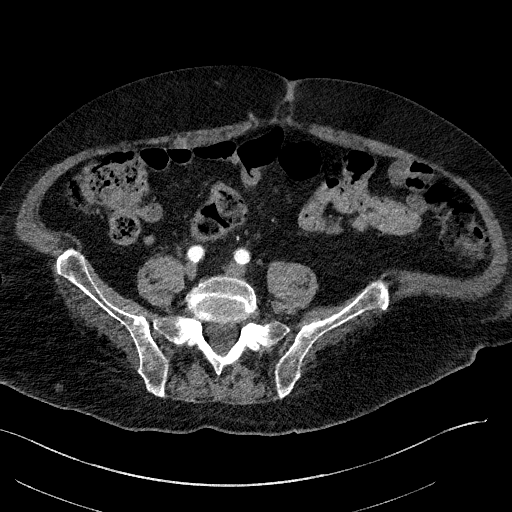
[im 147/339  mediastinal]
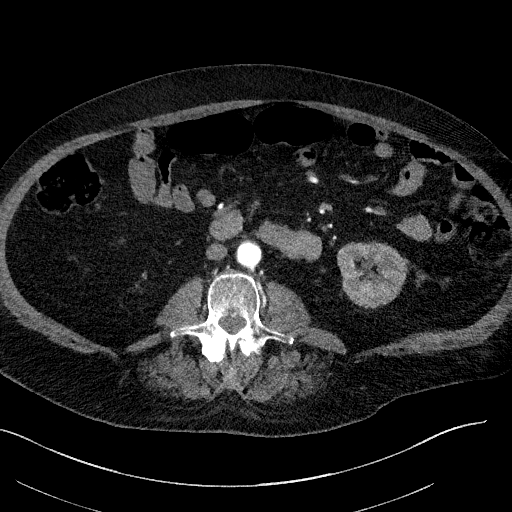
[im 192/339  mediastinal]
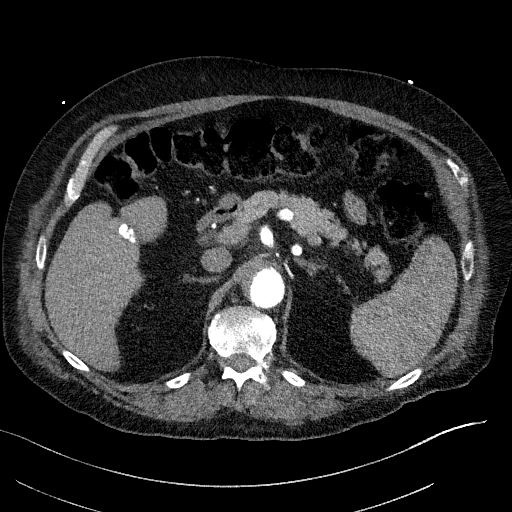
[im 236/339  mediastinal]
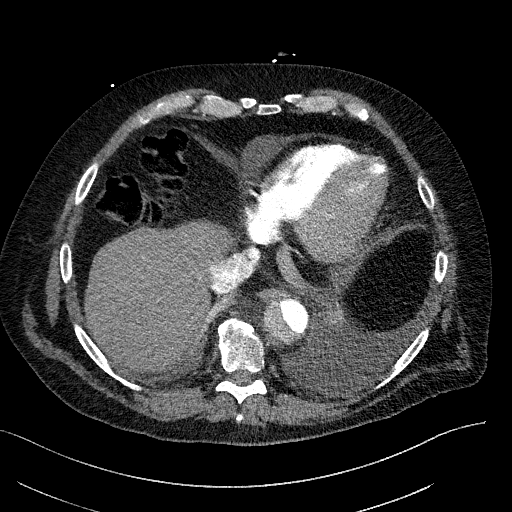
[im 265/339  mediastinal]
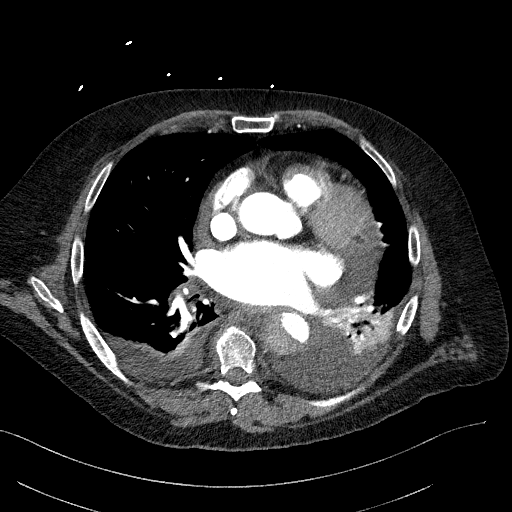
[im 280/339  lung]
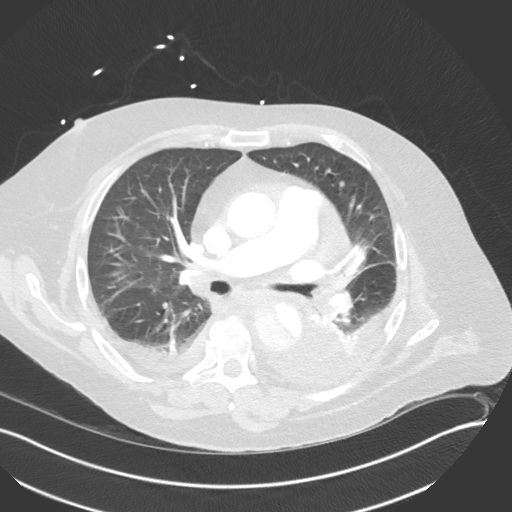
[im 294/339  lung]
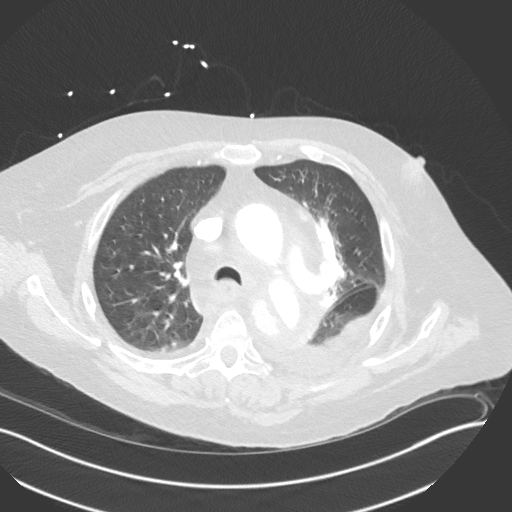
[im 309/339  mediastinal]
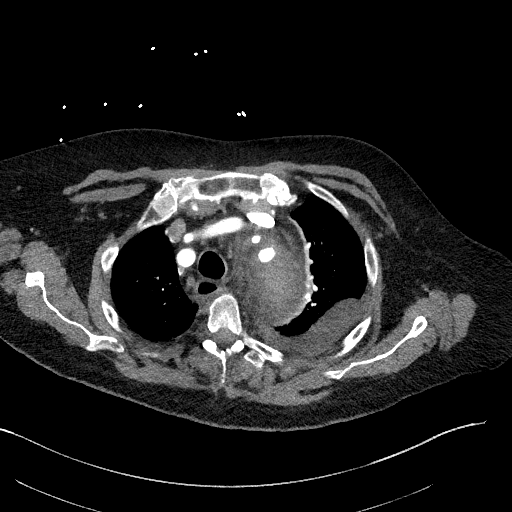
[im 309/339  lung]
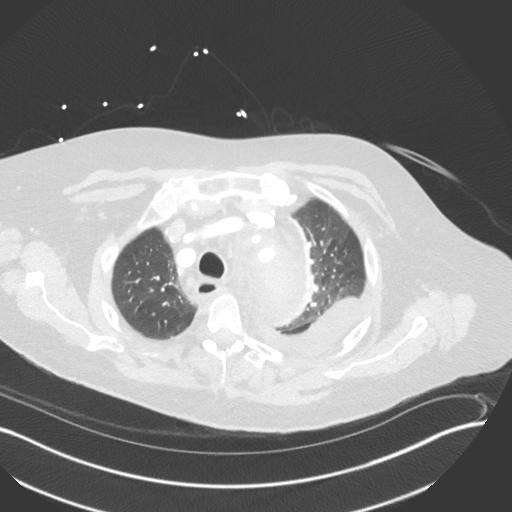
[im 324/339  lung]
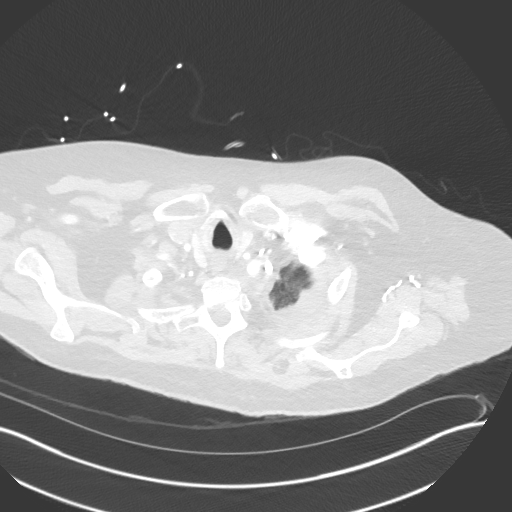

[Series 10: dissection 2mm cor · coronal · 0.88mm/px · 1 of 151 slices shown, 2 images]
[im 76/151  mediastinal]
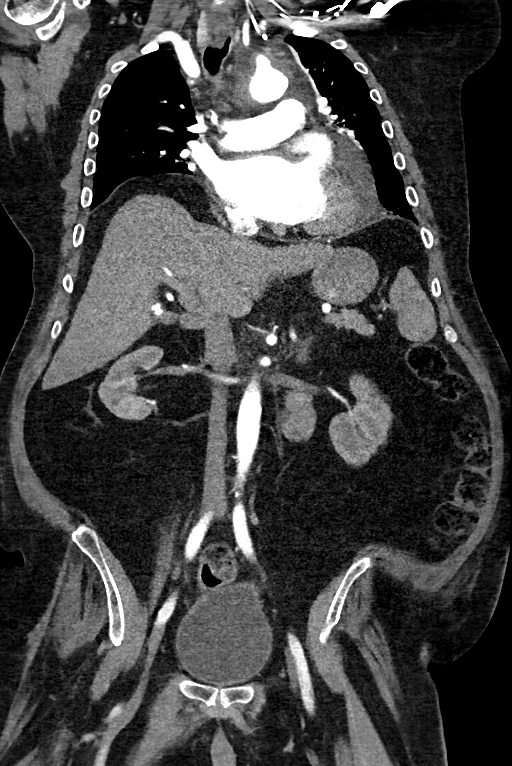
[im 76/151  bone]
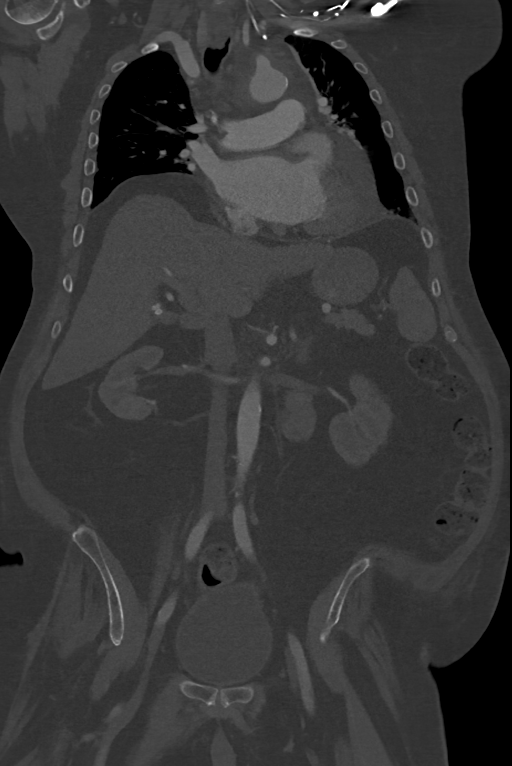

[12 of 36 positions shown; findings below may reference images not displayed]

FINDINGS: CTA CHEST FINDINGS

Cardiovascular: Continued presence of type B thoracic aortic
dissection is noted. There continues remain intramural and
mediastinal hematoma concerning for possible contained rupture. Mild
pericardial effusion is noted which was present on prior exam. The
great vessels are widely patent without significant stenosis.

Mediastinum/Nodes: No enlarged mediastinal, hilar, or axillary lymph
nodes. Thyroid gland, trachea, and esophagus demonstrate no
significant findings.

Lungs/Pleura: No pneumothorax is noted. Moderate bilateral pleural
effusions are noted with adjacent subsegmental atelectasis.

Musculoskeletal: No chest wall abnormality. No acute or significant
osseous findings.

Review of the MIP images confirms the above findings.

CTA ABDOMEN AND PELVIS FINDINGS

VASCULAR

Aorta: Thoracic aortic dissection extends to supraceliac aorta, but
is not seen distally. Atherosclerosis of abdominal aorta is noted
without aneurysm.

Celiac: Patent without evidence of aneurysm, dissection, vasculitis
or significant stenosis.

SMA: Patent without evidence of aneurysm, dissection, vasculitis or
significant stenosis.

Renals: Both renal arteries are patent without evidence of aneurysm,
dissection, vasculitis, fibromuscular dysplasia or significant
stenosis.

IMA: Patent without evidence of aneurysm, dissection, vasculitis or
significant stenosis.

Inflow: Patent without evidence of aneurysm, dissection, vasculitis
or significant stenosis.

Veins: No obvious venous abnormality within the limitations of this
arterial phase study.

Review of the MIP images confirms the above findings.

NON-VASCULAR

Hepatobiliary: Cholelithiasis is noted. No biliary dilatation is
noted. Liver is unremarkable.

Pancreas: Unremarkable. No pancreatic ductal dilatation or
surrounding inflammatory changes.

Spleen: Normal in size without focal abnormality.

Adrenals/Urinary Tract: Adrenal glands appear normal. Stable
exophytic right renal cyst is noted. No hydronephrosis or renal
obstruction is noted. Bilateral nephrolithiasis is noted.. Urinary
bladder is unremarkable.

Stomach/Bowel: Stomach is within normal limits. Appendix appears
normal. No evidence of bowel wall thickening, distention, or
inflammatory changes.

Lymphatic: No significant adenopathy is noted.

Reproductive: Prostate is unremarkable.

Other: No abdominal wall hernia or abnormality. No abdominopelvic
ascites.

Musculoskeletal: No acute or significant osseous findings.

Review of the MIP images confirms the above findings.
IMPRESSION: Type B thoracic aortic dissection is again noted which is stable in
size and appearance. There is a stable amount of intramural and
mediastinal hematoma present suggesting contained rupture as
described on prior exam. Great vessels are widely patent without
significant stenosis.

Stable pericardial effusion.

Mild bilateral pleural effusions are noted which are increased in
size compared to prior exam, with associated subsegmental
atelectasis.

Bilateral nonobstructive nephrolithiasis. No hydronephrosis or renal
obstruction is noted.

Cholelithiasis.

Thoracic aortic dissection is seen to extend to the supraceliac
abdominal aorta, but no dissection or aneurysm is seen more
distally. Mesenteric and renal arteries are widely patent without
significant stenosis.

Aortic Atherosclerosis ([id number]).

## 2019-03-06 IMAGING — DX DG CHEST 2V
2 series · 2 of 2 positions shown · non-contrast
Comparison: 01/28/2018 chest radiograph.  01/25/2018 CT chest.

CLINICAL DATA: C 67 y/o  M; 1 month of chronic chest pain.

EXAM:
CHEST - 2 VIEW

[chest ap]
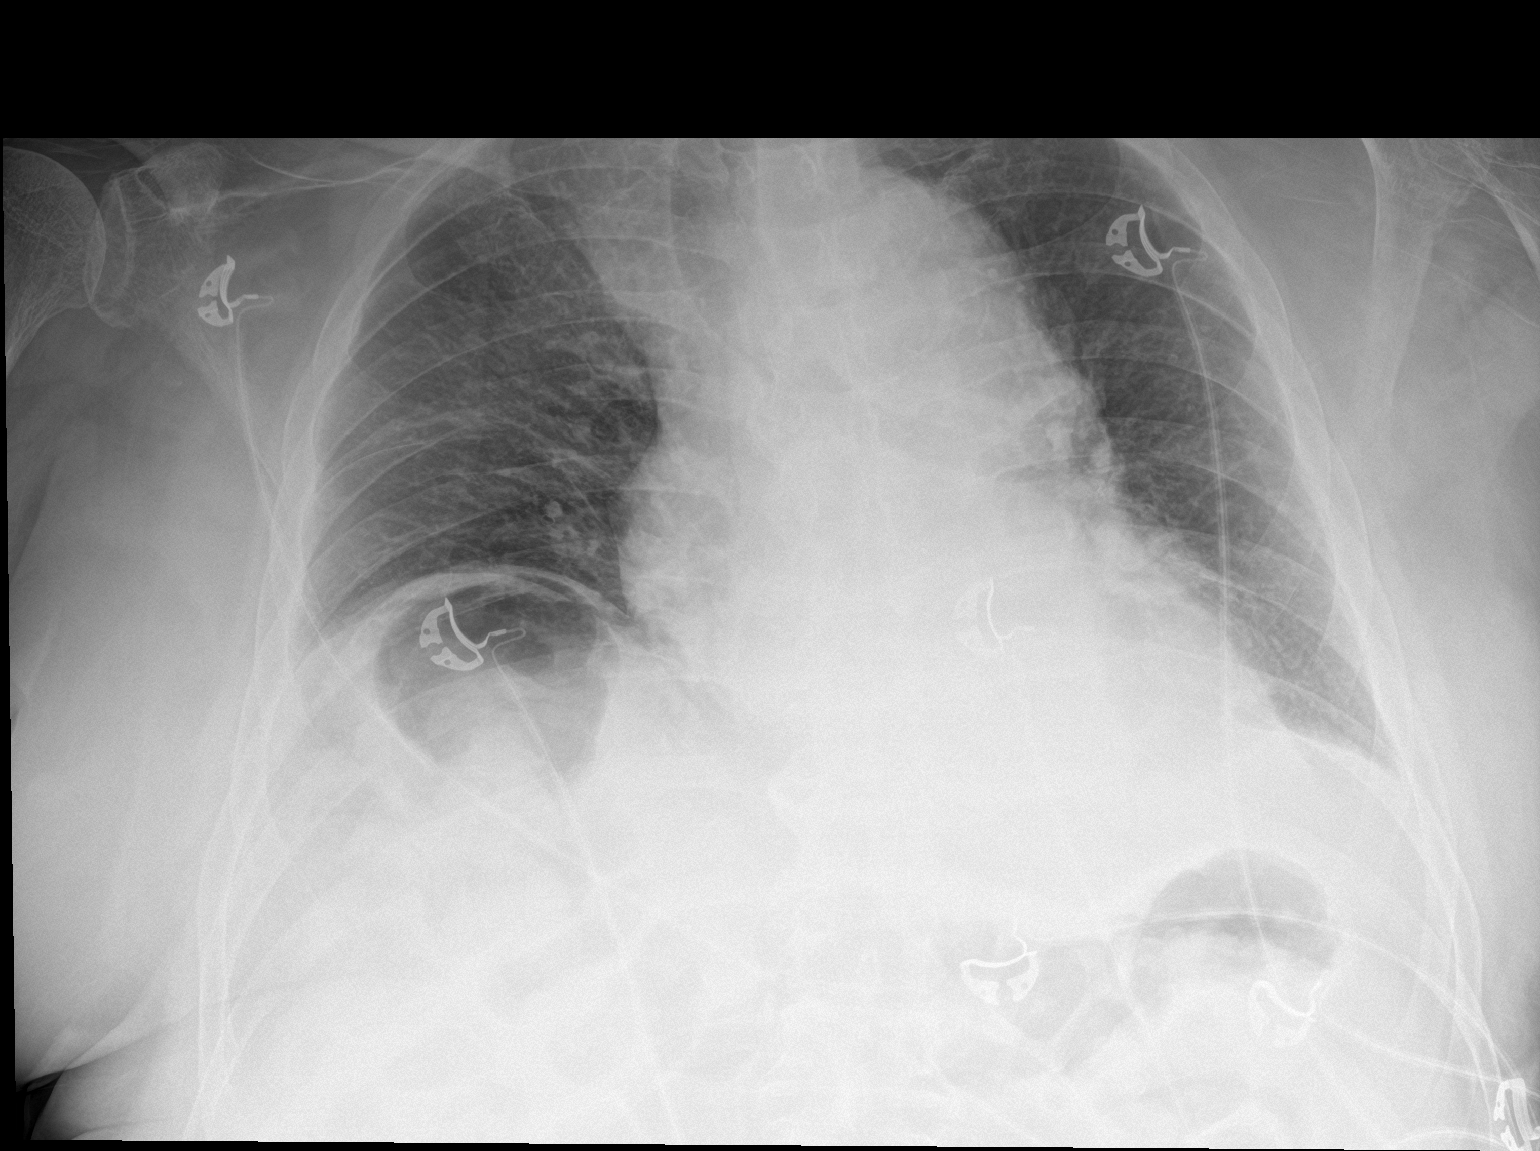

[chest lat]
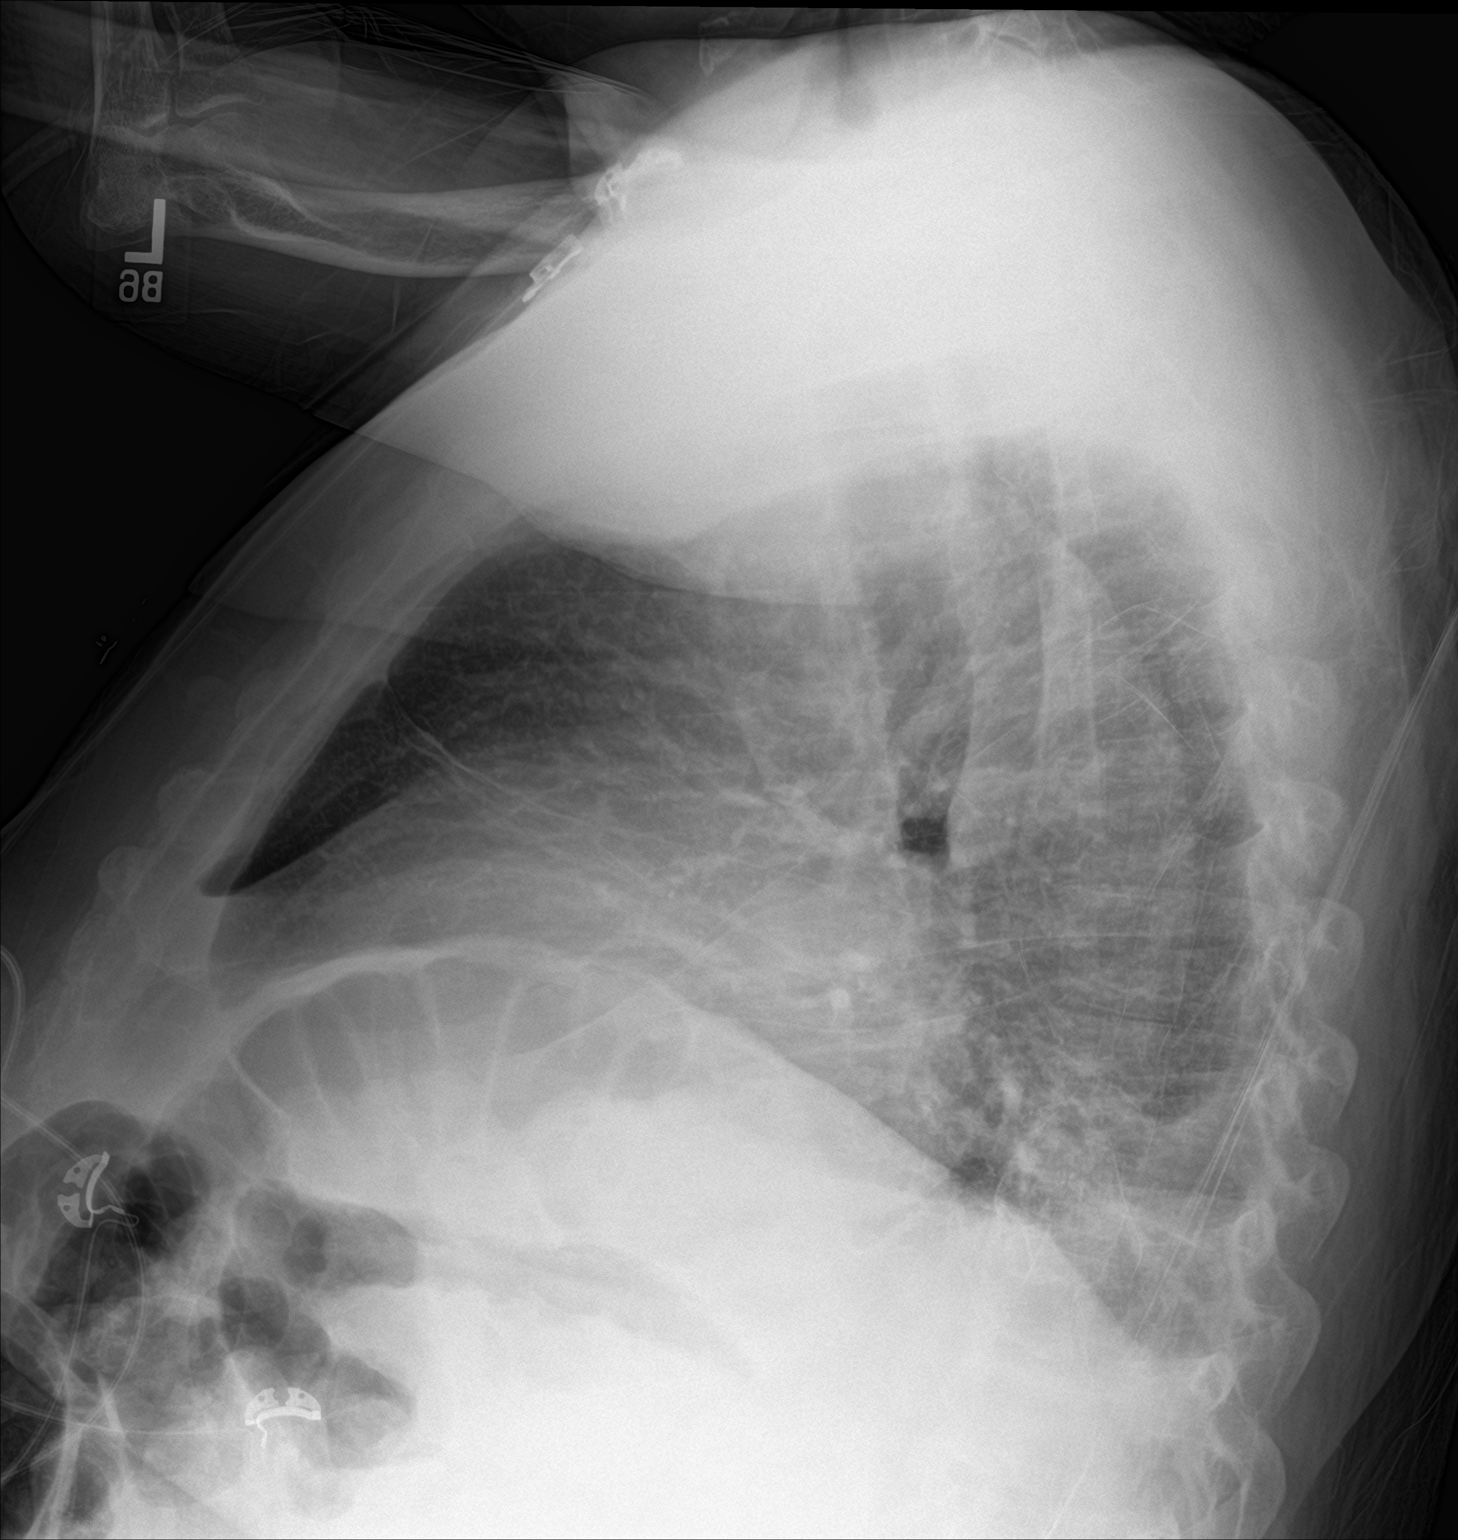

[2 of 2 positions shown; findings below may reference images not displayed]

FINDINGS: Stable white mediastinum compatible with known dissection. Stable
cardiac silhouette. Elevated right hemidiaphragm. Streaky opacities
in the lung bases probably represents atelectasis. Pulmonary
vascular congestion. No consolidation. No pleural effusion or
pneumothorax. Bones are unremarkable.
IMPRESSION: Stable white mediastinum compatible with known dissection. Elevated
right hemidiaphragm. Minor bibasilar atelectasis.

By: Meriem Yount M.D.

## 2019-08-16 ENCOUNTER — Other Ambulatory Visit
Admission: RE | Admit: 2019-08-16 | Discharge: 2019-08-16 | Disposition: A | Discharge status: Home / Self Care | Payer: Medicare Other | Source: Skilled Nursing Facility | Attending: Hospice and Palliative Medicine | Admitting: Hospice and Palliative Medicine

## 2019-08-16 DIAGNOSIS — D688 Other specified coagulation defects: Secondary | ICD-10-CM | POA: Insufficient documentation

## 2019-08-16 DIAGNOSIS — Z79899 Other long term (current) drug therapy: Secondary | ICD-10-CM | POA: Insufficient documentation

## 2019-08-16 LAB — COMPREHENSIVE METABOLIC PANEL
ALT: 54 U/L — ABNORMAL HIGH (ref 0–44)
AST: 95 U/L — ABNORMAL HIGH (ref 15–41)
Albumin: 2.7 g/dL — ABNORMAL LOW (ref 3.5–5.0)
Alkaline Phosphatase: 355 U/L — ABNORMAL HIGH (ref 38–126)
Anion gap: 14 (ref 5–15)
BUN: 33 mg/dL — ABNORMAL HIGH (ref 8–23)
CO2: 23 mmol/L (ref 22–32)
Calcium: 8.7 mg/dL — ABNORMAL LOW (ref 8.9–10.3)
Chloride: 101 mmol/L (ref 98–111)
Creatinine, Ser: 1.98 mg/dL — ABNORMAL HIGH (ref 0.61–1.24)
GFR calc Af Amer: 39 mL/min — ABNORMAL LOW (ref >60)
GFR calc non Af Amer: 34 mL/min — ABNORMAL LOW (ref >60)
Glucose, Bld: 98 mg/dL (ref 70–99)
Potassium: 3.6 mmol/L (ref 3.5–5.1)
Sodium: 138 mmol/L (ref 135–145)
Total Bilirubin: 6.3 mg/dL — ABNORMAL HIGH (ref 0.3–1.2)
Total Protein: 6.2 g/dL — ABNORMAL LOW (ref 6.5–8.1)

## 2019-08-16 LAB — CBC WITH DIFFERENTIAL/PLATELET
Abs Immature Granulocytes: 0.05 10*3/uL (ref 0.00–0.07)
Basophils Absolute: 0 10*3/uL (ref 0.0–0.1)
Basophils Relative: 0 %
Eosinophils Absolute: 0 10*3/uL (ref 0.0–0.5)
Eosinophils Relative: 0 %
HCT: 33.8 % — ABNORMAL LOW (ref 39.0–52.0)
Hemoglobin: 10.8 g/dL — ABNORMAL LOW (ref 13.0–17.0)
Immature Granulocytes: 1 %
Lymphocytes Relative: 6 %
Lymphs Abs: 0.6 10*3/uL — ABNORMAL LOW (ref 0.7–4.0)
MCH: 27.6 pg (ref 26.0–34.0)
MCHC: 32 g/dL (ref 30.0–36.0)
MCV: 86.2 fL (ref 80.0–100.0)
Monocytes Absolute: 0.6 10*3/uL (ref 0.1–1.0)
Monocytes Relative: 6 %
Neutro Abs: 8.2 10*3/uL — ABNORMAL HIGH (ref 1.7–7.7)
Neutrophils Relative %: 87 %
Platelets: 197 10*3/uL (ref 150–400)
RBC: 3.92 MIL/uL — ABNORMAL LOW (ref 4.22–5.81)
RDW: 16.8 % — ABNORMAL HIGH (ref 11.5–15.5)
WBC: 9.4 10*3/uL (ref 4.0–10.5)
nRBC: 0 % (ref 0.0–0.2)

## 2019-08-16 LAB — BILIRUBIN, DIRECT: Bilirubin, Direct: 4.4 mg/dL — ABNORMAL HIGH (ref 0.0–0.2)

## 2019-10-20 ENCOUNTER — Encounter: Payer: Self-pay | Admitting: Cardiovascular Disease

## 2019-10-20 ENCOUNTER — Telehealth (INDEPENDENT_AMBULATORY_CARE_PROVIDER_SITE_OTHER): Payer: Medicare Other | Admitting: Cardiovascular Disease

## 2019-10-20 VITALS — BP 122/80 | HR 84 | Temp 98.0°F | Resp 20

## 2019-10-20 DIAGNOSIS — I482 Chronic atrial fibrillation, unspecified: Secondary | ICD-10-CM

## 2019-10-20 NOTE — Progress Notes (Signed)
Virtual Visit via Telephone Note   This visit type was conducted due to national recommendations for restrictions regarding the COVID-19 Pandemic (e.g. social distancing) in an effort to limit this patient's exposure and mitigate transmission in our community.  Due to his co-morbid illnesses, this patient is at least at moderate risk for complications without adequate follow up.  This format is felt to be most appropriate for this patient at this time.  The patient did not have access to video technology/had technical difficulties with video requiring transitioning to audio format only (telephone).  All issues noted in this document were discussed and addressed.  No physical exam could be performed with this format.  Please refer to the patient's chart for his  consent to telehealth for [facility].   Date:  10/20/2019   ID:  Todd Page, DOB [birth year], MRN [id number]  Patient Location: [place] Provider Location: Office  PCP:  Hilbert Corrigan, MD  Cardiologist:   Johnsie Cancel Electrophysiologist:  None   Evaluation Performed:  Follow-Up Visit  Chief Complaint:  Afib  History of Present Illness:    68 y.o. with limited functional ability primarily in wheel chair with no weight bearing. History of chronic afib and type B dissection MS clouded by schizophrenia, and depression CRF;s HTN and HLD. Dissection was April 2019 Extended cephaled only to origin of celiac artery [facility] at [facility]  No cardiac complaints Spoke with his nursing aid Sharyn Lull He has no cardiac complaints   The patient  does not have symptoms concerning for COVID-19 infection (fever, chills, cough, or new shortness of breath).    Past Medical History:  Diagnosis Date  . A-fib ([place])   . Anemia   . Atrial fibrillation ([place]) 06/2012   onset 06/2012; normal TSH; EF-45%  . CHF (congestive heart failure) ([place])   . Cholelithiasis    incidental finding on CT in 2013  . Cognitive communication  deficit   . Dysphagia   . Dysphagia   . Feeding difficulties   . Gout   . Hyperlipidemia   . Hypertension    minimal coronary atherosclerosis by CT  . Hypokalemia   . Liver disease   . Noncompliance 09/21/2012  . Osteoporosis   . Overweight(278.02)   . Parkinson's disease ([place])   . Peripheral vascular disease ([place])   . Renal disorder   . Schizoaffective disorder ([place])    With depression  . Systolic dysfunction   . Thoracic aortic aneurysm ([place])   . Thrombophilia ([place])   . Tinea cruris   . Vascular dementia ([place])   . Vitamin D deficiency    Past Surgical History:  Procedure Laterality Date  . COLONOSCOPY  2006   normal screening examination  . prosthetic heart valve       No outpatient medications have been marked as taking for the 10/20/19 encounter (Appointment) with Josue Hector, MD.     Allergies:   Patient has no known allergies.   Social History   Tobacco Use  . Smoking status: Never Smoker  . Smokeless tobacco: Never Used  Substance Use Topics  . Alcohol use: No    Comment: former   . Drug use: No     Family Hx: The patient's family history includes Hypertension in his mother; Liver disease in his father.  ROS:   Please see the history of present illness.     All other systems reviewed and are negative.   Prior CV studies:   The  following studies were reviewed today:  Echo March 2019 EF 60-65% Moderate LAE trivial AR   Labs/Other Tests and Data Reviewed:    EKG:  afib rate 72 otherwise normal   Recent Labs: 08/16/2019: ALT 54; BUN 33; Creatinine, Ser 1.98; Hemoglobin 10.8; Platelets 197; Potassium 3.6; Sodium 138   Recent Lipid Panel Lab Results  Component Value Date/Time   CHOL 63 11/26/2014 08:16 AM   TRIG 76 11/26/2014 08:16 AM   HDL 26 (L) 11/26/2014 08:16 AM   LDLCALC 22 11/26/2014 08:16 AM    Wt Readings from Last 3 Encounters:  09/23/18 236 lb (107 kg)  05/12/18 231 lb 7.7 oz (105 kg)  02/05/18 227 lb 1.2 oz (103 kg)       Objective:    Vital Signs:  There were no vitals taken for this visit.    Telephone visit no exam   ASSESSMENT & PLAN:    1. Afib:  Not on anticoagulation due to frequent falls and type B dissection  2. HTN:  Well controlled.  Continue current medications and low sodium Dash type diet.   3. Schizophrenia;  Seems more on the sedate side f/u with primary continue current meds  4. Dissection: July 2019 type B BP under good control CT status stable 4.1 cm no extension  COVID-19 Education: The signs and symptoms of COVID-19 were discussed with the patient and how to seek care for testing (follow up with PCP or arrange E-visit).  The importance of social distancing was discussed today.  Time:   Today, I have spent 30 minutes with the patient with telehealth technology discussing the above problems.     Medication Adjustments/Labs and Tests Ordered: Current medicines are reviewed at length with the patient today.  Concerns regarding medicines are outlined above.   Tests Ordered:  CTA abdomen per [facility] 11/06/19   Medication Changes:  None   Disposition:  Follow up in a year  Signed, Jenkins Rouge, MD  10/20/2019 9:22 AM    [facility]

## 2019-11-06 ENCOUNTER — Ambulatory Visit (HOSPITAL_COMMUNITY): Admission: RE | Admit: 2019-11-06 | Payer: Medicare Other | Source: Ambulatory Visit

## 2019-11-11 ENCOUNTER — Ambulatory Visit (HOSPITAL_COMMUNITY)
Admission: RE | Admit: 2019-11-11 | Discharge: 2019-11-11 | Disposition: A | Discharge status: Home / Self Care | Payer: Medicare Other | Source: Ambulatory Visit | Attending: Urology | Admitting: Urology

## 2019-11-11 ENCOUNTER — Other Ambulatory Visit: Payer: Self-pay

## 2019-11-11 DIAGNOSIS — D3001 Benign neoplasm of right kidney: Secondary | ICD-10-CM | POA: Insufficient documentation

## 2019-11-11 LAB — POCT I-STAT CREATININE: Creatinine, Ser: 1.7 mg/dL — ABNORMAL HIGH (ref 0.61–1.24)

## 2019-11-11 MED ORDER — IOHEXOL 300 MG/ML  SOLN
100.0000 mL | Freq: Once | INTRAMUSCULAR | Status: AC | PRN
  Administered 2019-11-11: 75 mL via INTRAVENOUS

## 2021-01-10 NOTE — Progress Notes (Signed)
Cardiology Office Note    Date:  01/16/2021   ID:  HENRIQUE PAREKH, DOB 1951/02/29, MRN [id number]   PCP:  Hilbert Corrigan, [place]  Cardiologist:  Jenkins Rouge, MD   Advanced Practice Provider:  No care team member to display Electrophysiologist:  None   [id number]}   Chief Complaint  Patient presents with  . Follow-up    History of Present Illness:  ODESSA MORREN is a 70 y.o. male with history of chronic atrial fibrillation not anticoagulated due to frequent falls and type B dissection, type B aortic dissection on CTA 09/2018 extended only to the origin of the celiac arteries, hypertension, HLD, MS, schizophrenia, depression, wheelchair with no weightbearing  Last had a telemedicine visit with Dr. Johnsie Cancel 10/20/2019 and was doing well blood pressure well controlled.  CT 11/11/2019 chronic thoracoabdominal aortic dissection incompletely visualized.  Patient comes in from [place] with an Environmental consultant. Denies chest pain, palpitations, dyspnea, dizziness, edema or presyncope. No weight bearing at all. Feet sometimes swell otherwise no complaints.     Past Medical History:  Diagnosis Date  . A-fib ([place])   . Anemia   . Atrial fibrillation ([place]) 06/2012   onset 06/2012; normal TSH; EF-45%  . CHF (congestive heart failure) ([place])   . Cholelithiasis    incidental finding on CT in 2013  . Cognitive communication deficit   . Dysphagia   . Dysphagia   . Feeding difficulties   . Gout   . Hyperlipidemia   . Hypertension    minimal coronary atherosclerosis by CT  . Hypokalemia   . Liver disease   . Noncompliance 09/21/2012  . Osteoporosis   . Overweight(278.02)   . Parkinson's disease ([place])   . Peripheral vascular disease ([place])   . Renal disorder   . Schizoaffective disorder ([place])    With depression  . Systolic dysfunction   . Thoracic aortic aneurysm ([place])   . Thrombophilia ([place])   . Tinea cruris   . Vascular dementia ([place])   . Vitamin D  deficiency     Past Surgical History:  Procedure Laterality Date  . COLONOSCOPY  2006   normal screening examination  . prosthetic heart valve      Current Medications: Current Meds  Medication Sig  . acetaminophen (TYLENOL) 650 MG CR tablet Take 650 mg by mouth every 8 (eight) hours as needed for pain.  Marland Kitchen alum & mag hydroxide-simeth (MAALOX/MYLANTA) 200-200-20 MG/5ML suspension Take 30 mLs by mouth every 2 (two) hours as needed for indigestion.   Marland Kitchen amiodarone (PACERONE) 200 MG tablet Take 1 tablet (200 mg total) by mouth daily.  Marland Kitchen amLODipine (NORVASC) 5 MG tablet Take 1 tablet (5 mg total) by mouth every evening.  Marland Kitchen atorvastatin (LIPITOR) 10 MG tablet Take 10 mg by mouth daily.  . bisacodyl (DULCOLAX) 5 MG EC tablet Take 10 mg by mouth every morning.  . calcium carbonate (TUMS - DOSED IN MG ELEMENTAL CALCIUM) 500 MG chewable tablet Chew 1 tablet by mouth 3 (three) times daily.  . carbidopa-levodopa (SINEMET CR) 50-200 MG tablet Take 1 tablet by mouth 2 (two) times daily.  . Cholecalciferol (VITAMIN D3) 5000 units CAPS Take 1 capsule by mouth every morning.  . cloNIDine (CATAPRES) 0.2 MG tablet Take 1 tablet (0.2 mg total) by mouth 2 (two) times daily.  . divalproex (DEPAKOTE) 125 MG DR tablet Take 375 mg by mouth 2 (two) times daily.   . ferrous sulfate 324  MG TBEC Take by mouth.  Marland Kitchen FLUoxetine (PROZAC) 20 MG capsule Take 20 mg by mouth every morning.   Marland Kitchen guaiFENesin (MUCINEX) 600 MG 12 hr tablet Take 600 mg by mouth 2 (two) times daily.  Marland Kitchen ipratropium-albuterol (DUONEB) 0.5-2.5 (3) MG/3ML SOLN Take 3 mLs by nebulization every 6 (six) hours as needed (for SOB; wheezing).  Marland Kitchen KLOR-CON M20 20 MEQ tablet Take 20 mEq by mouth daily.   Marland Kitchen levothyroxine (SYNTHROID) 25 MCG tablet Take 25 mcg by mouth daily.  . magnesium hydroxide (MILK OF MAGNESIA) 400 MG/5ML suspension Take 30 mLs by mouth every morning.  . metoprolol (LOPRESSOR) 50 MG tablet Take 50 mg by mouth 2 (two) times daily.  Marland Kitchen  OLANZapine (ZYPREXA) 15 MG tablet Take 15 mg by mouth at bedtime.  . ondansetron (ZOFRAN) 4 MG tablet Take 4 mg by mouth every 6 (six) hours as needed for nausea or vomiting.   . torsemide (DEMADEX) 20 MG tablet Take 20 mg by mouth daily.     Allergies:   Patient has no known allergies.   Social History   Socioeconomic History  . Marital status: Single    Spouse name: Not on file  . Number of children: Not on file  . Years of education: Not on file  . Highest education level: Not on file  Occupational History  . Not on file  Tobacco Use  . Smoking status: Never Smoker  . Smokeless tobacco: Never Used  Vaping Use  . Vaping Use: Never used  Substance and Sexual Activity  . Alcohol use: No    Comment: former   . Drug use: No  . Sexual activity: Not on file  Other Topics Concern  . Not on file  Social History Narrative  . Not on file   Social Determinants of Health   Financial Resource Strain: Not on file  Food Insecurity: Not on file  Transportation Needs: Not on file  Physical Activity: Not on file  Stress: Not on file  Social Connections: Not on file     Family History:  The patient's   family history includes Hypertension in his mother; Liver disease in his father.   ROS:   Please see the history of present illness.    ROS All other systems reviewed and are negative.   PHYSICAL EXAM:   VS:  BP 130/78   Pulse (!) 58   Ht 5\' 11"  (1.803 m)   Wt 199 lb (90.3 kg)   SpO2 96%   BMI 27.75 kg/m   Physical Exam  GEN: Thin, elderly, in no acute distress  Neck: no JVD, carotid bruits, or masses Cardiac:irreg irreg; no murmurs, rubs, or gallops  Respiratory:  clear to auscultation bilaterally, normal work of breathing GI: soft, nontender, nondistended, + BS Ext: without cyanosis, clubbing, or edema, Good distal pulses bilaterally Neuro:  Alert and Oriented x 3 Psych: euthymic mood, full affect  Wt Readings from Last 3 Encounters:  01/16/21 199 lb (90.3 kg)   09/23/18 236 lb (107 kg)  05/12/18 231 lb 7.7 oz (105 kg)      Studies/Labs Reviewed:   EKG:  EKG is  ordered today.  The ekg ordered today demonstrates Atrial fib  58/m nonspecific ST changes  Recent Labs: No results found for requested labs within last 8760 hours.   Lipid Panel    Component Value Date/Time   CHOL 63 11/26/2014 0816   TRIG 76 11/26/2014 0816   HDL 26 (L) 11/26/2014 4098  VLDL 15 11/26/2014 0816   LDLCALC 22 11/26/2014 0816    Additional studies/ records that were reviewed today include:  2D echo 01/26/2018 Study Conclusions   - Left ventricle: The cavity size was normal. There was moderate    concentric hypertrophy. Systolic function was normal. The    estimated ejection fraction was in the range of 60% to 65%. Wall    motion was normal; there were no regional wall motion    abnormalities.  - Aortic valve: There was trivial regurgitation.  - Left atrium: The atrium was moderately dilated.  - Pulmonary arteries: PA peak pressure: 35 mm Hg (S).  - Pericardium, extracardiac: A trivial pericardial effusion was    identified posterior to the heart.   CT of the abdomen 10/2019 IMPRESSION: 1. Near complete resolution of the abnormality seen lateral to the right kidney on the previous study from 10/23/2018. On today's exam, there is a thin band of residual soft tissue between the kidney in the liver, likely representing scar with some tethering of the hepatic capsule. 2. Nonobstructing stones upper pole right kidney. Right double-J internal ureteral stent is evident with some mild periureteric edema around the mid and distal right ureter. 3. Gas in the bladder lumen associated with some apparent gas in the wall of the urinary bladder. This could be related to recent instrumentation. In the setting of diabetes, emphysematous cystitis would be a consideration. 4. Cholelithiasis. No substantial intra or extrahepatic biliary duct dilatation although there is  a high density focus that appears to be in the distal common bile duct raising the question of choledocholithiasis. MRI/MRCP could be used to further evaluate as clinically warranted. 5. Chronic thoracoabdominal aortic dissection, incompletely visualized on today's study. 6.  Aortic Atherosclerois (ICD10-170.0)     Electronically Signed   By: Misty Stanley M.D.   On: 11/11/2019 10:58   Risk Assessment/Calculations:    [id number] Score = 3  {Click here to calculate score.  REFRESH note before signing.       :[id number]}PJKD indicates a 3.2% annual risk of stroke. The patient's score is based upon: CHF History: No HTN History: Yes Diabetes History: No Stroke History: No Vascular Disease History: Yes Age Score: 1 Gender Score: 0        ASSESSMENT:    1. 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HCC)   2. Permanent atrial fibrillation ([place])   3. Essential hypertension   4. Hyperlipidemia, unspecified hyperlipidemia type      PLAN:  In order of problems listed above:  Type B aortic dissection on CT 10/23/2018 not well visualized on CT 11/11/2019. Will refer back to Dr. Lucianne Lei Trigt's office for f/u.  Chronic atrial fibrillation not anticoagulated due to fall risk and aortic dissection. Rate controlled, asymptomatic  Hypertension BP controlled   Hyperlipidemia-patient says labs were recently drawn at facility but I do not have a copy of those.  Managed by PCP  Shared Decision Making/Informed Consent        Medication Adjustments/Labs and Tests Ordered: Current medicines are reviewed at length with the patient today.  Concerns regarding medicines are outlined above.  Medication changes, Labs and Tests ordered today are listed in the Patient Instructions below. Patient Instructions  Medication Instructions:  Your physician recommends that you continue on your current medications as directed. Please refer to the Current Medication list given to you today.  *If you  need a refill on your cardiac medications before your next appointment, please call your pharmacy*  Lab Work: None today If you have labs (blood work) drawn today and your tests are completely normal, you will receive your results only by: Marland Kitchen MyChart Message (if you have MyChart) OR . A paper copy in the mail If you have any lab test that is abnormal or we need to change your treatment, we will call you to review the results.   Testing/Procedures: None today   Follow-Up: At [facility], you and your health needs are our priority.  As part of our continuing mission to provide you with exceptional heart care, we have created designated Provider Care Teams.  These Care Teams include your primary Cardiologist (physician) and Advanced Practice Providers (APPs -  Physician Assistants and Nurse Practitioners) who all work together to provide you with the care you need, when you need it.  We recommend signing up for the patient portal called "MyChart".  Sign up information is provided on this After Visit Summary.  MyChart is used to connect with patients for Virtual Visits (Telemedicine).  Patients are able to view lab/test results, encounter notes, upcoming appointments, etc.  Non-urgent messages can be sent to your provider as well.   To learn more about what you can do with MyChart, go to [email or web address].    Your next appointment:   12 month(s)  The format for your next appointment:   In Person  Provider:   Jenkins Rouge, MD   Other Instructions We have placed a referral to go to [facility] [phone]. They will call you to schedule an appointment.     Sumner Boast, PA-C  01/16/2021 1:19 PM    [facility] [address], [place], [place]  [address] Phone: [phone]; Fax: [phone]

## 2021-01-16 ENCOUNTER — Other Ambulatory Visit: Payer: Self-pay

## 2021-01-16 ENCOUNTER — Other Ambulatory Visit: Payer: Self-pay | Admitting: Surgery

## 2021-01-16 ENCOUNTER — Encounter: Payer: Self-pay | Admitting: Physician Assistant

## 2021-01-16 ENCOUNTER — Ambulatory Visit (INDEPENDENT_AMBULATORY_CARE_PROVIDER_SITE_OTHER): Payer: Medicare Other | Admitting: Physician Assistant

## 2021-01-16 VITALS — BP 130/78 | HR 58 | Ht 71.0 in | Wt 199.0 lb

## 2021-01-16 DIAGNOSIS — I10 Essential (primary) hypertension: Secondary | ICD-10-CM

## 2021-01-16 DIAGNOSIS — I7101 Dissection of thoracic aorta: Secondary | ICD-10-CM | POA: Diagnosis not present

## 2021-01-16 DIAGNOSIS — I712 Thoracic aortic aneurysm, without rupture, unspecified: Secondary | ICD-10-CM

## 2021-01-16 DIAGNOSIS — E785 Hyperlipidemia, unspecified: Secondary | ICD-10-CM

## 2021-01-16 DIAGNOSIS — I71019 Dissection of thoracic aorta, unspecified: Secondary | ICD-10-CM

## 2021-01-16 DIAGNOSIS — I4821 Permanent atrial fibrillation: Secondary | ICD-10-CM

## 2021-01-16 NOTE — Patient Instructions (Signed)
Medication Instructions:  Your physician recommends that you continue on your current medications as directed. Please refer to the Current Medication list given to you today.  *If you need a refill on your cardiac medications before your next appointment, please call your pharmacy*   Lab Work: None today If you have labs (blood work) drawn today and your tests are completely normal, you will receive your results only by: Marland Kitchen MyChart Message (if you have MyChart) OR . A paper copy in the mail If you have any lab test that is abnormal or we need to change your treatment, we will call you to review the results.   Testing/Procedures: None today   Follow-Up: At [facility], you and your health needs are our priority.  As part of our continuing mission to provide you with exceptional heart care, we have created designated Provider Care Teams.  These Care Teams include your primary Cardiologist (physician) and Advanced Practice Providers (APPs -  Physician Assistants and Nurse Practitioners) who all work together to provide you with the care you need, when you need it.  We recommend signing up for the patient portal called "MyChart".  Sign up information is provided on this After Visit Summary.  MyChart is used to connect with patients for Virtual Visits (Telemedicine).  Patients are able to view lab/test results, encounter notes, upcoming appointments, etc.  Non-urgent messages can be sent to your provider as well.   To learn more about what you can do with MyChart, go to [email or web address].    Your next appointment:   12 month(s)  The format for your next appointment:   In Person  Provider:   Jenkins Rouge, MD   Other Instructions We have placed a referral to go to [facility] [phone]. They will call you to schedule an appointment.

## 2021-01-16 NOTE — Addendum Note (Signed)
Addended by: Barbarann Ehlers A on: 01/16/2021 03:25 PM   Modules accepted: Orders

## 2021-02-22 ENCOUNTER — Inpatient Hospital Stay: Admission: RE | Admit: 2021-02-22 | Payer: Medicare Other | Source: Ambulatory Visit

## 2021-02-22 ENCOUNTER — Other Ambulatory Visit: Payer: Self-pay | Admitting: Surgery

## 2021-02-22 ENCOUNTER — Other Ambulatory Visit: Payer: Self-pay | Admitting: *Deleted

## 2021-02-22 ENCOUNTER — Ambulatory Visit: Payer: Medicare Other | Admitting: Surgery

## 2021-02-22 DIAGNOSIS — I712 Thoracic aortic aneurysm, without rupture, unspecified: Secondary | ICD-10-CM

## 2021-02-22 DIAGNOSIS — I7101 Dissection of thoracic aorta: Secondary | ICD-10-CM

## 2021-02-22 DIAGNOSIS — I71019 Dissection of thoracic aorta, unspecified: Secondary | ICD-10-CM

## 2021-03-22 ENCOUNTER — Other Ambulatory Visit: Payer: Self-pay

## 2021-03-22 ENCOUNTER — Encounter (HOSPITAL_COMMUNITY): Payer: Self-pay | Admitting: Radiology

## 2021-03-22 ENCOUNTER — Ambulatory Visit (HOSPITAL_COMMUNITY)
Admission: RE | Admit: 2021-03-22 | Discharge: 2021-03-22 | Disposition: A | Discharge status: Home / Self Care | Payer: Medicare Other | Source: Ambulatory Visit | Attending: Surgery | Admitting: Surgery

## 2021-03-22 DIAGNOSIS — I712 Thoracic aortic aneurysm, without rupture, unspecified: Secondary | ICD-10-CM

## 2021-03-22 MED ORDER — IOHEXOL 350 MG/ML SOLN
100.0000 mL | Freq: Once | INTRAVENOUS | Status: AC | PRN
  Administered 2021-03-22: 100 mL via INTRAVENOUS

## 2021-03-29 ENCOUNTER — Ambulatory Visit (INDEPENDENT_AMBULATORY_CARE_PROVIDER_SITE_OTHER): Payer: Medicare Other | Admitting: Surgery

## 2021-03-29 ENCOUNTER — Encounter: Payer: Self-pay | Admitting: Surgery

## 2021-03-29 ENCOUNTER — Other Ambulatory Visit: Payer: Self-pay

## 2021-03-29 VITALS — BP 101/65 | HR 65 | Resp 20 | Ht 71.0 in | Wt 200.0 lb

## 2021-03-29 DIAGNOSIS — I712 Thoracic aortic aneurysm, without rupture, unspecified: Secondary | ICD-10-CM

## 2021-03-29 NOTE — Progress Notes (Signed)
HPI:  The patient is a 70 year old gentleman with Parkinson's disease, diabetes, and hypertension who has been non-ambulatory for years in a wheelchair at a chronic care facility who presented in 12/2017 with an acute type B aortic dissection and chest pain. The dissection started distal to the left subclavian artery and the false lumen extended to the abdominal aorta just above the renal arteries. He was evaluated by Dr. Prescott Gum in 03/2018 and there was partial resolution of the false lumen on CT at that time. He has not been seen since then and most recently saw cardiology in 12/2020 and was referred back here for follow up. He is living at [facility] center and is wheelchair bound. He denies any chest or back pain.   Current Outpatient Medications  Medication Sig Dispense Refill  . acetaminophen (TYLENOL) 650 MG CR tablet Take 650 mg by mouth every 8 (eight) hours as needed for pain.    Marland Kitchen alum & mag hydroxide-simeth (MAALOX/MYLANTA) 200-200-20 MG/5ML suspension Take 30 mLs by mouth every 2 (two) hours as needed for indigestion.     Marland Kitchen amiodarone (PACERONE) 200 MG tablet Take 1 tablet (200 mg total) by mouth daily.    Marland Kitchen amLODipine (NORVASC) 5 MG tablet Take 1 tablet (5 mg total) by mouth every evening.    Marland Kitchen atorvastatin (LIPITOR) 10 MG tablet Take 10 mg by mouth daily.    . bisacodyl (DULCOLAX) 5 MG EC tablet Take 10 mg by mouth every morning.    . calcium carbonate (TUMS - DOSED IN MG ELEMENTAL CALCIUM) 500 MG chewable tablet Chew 1 tablet by mouth 3 (three) times daily.    . carbidopa-levodopa (SINEMET CR) 50-200 MG tablet Take 1 tablet by mouth 2 (two) times daily.    . Cholecalciferol (VITAMIN D3) 5000 units CAPS Take 1 capsule by mouth every morning.    . cloNIDine (CATAPRES) 0.2 MG tablet Take 1 tablet (0.2 mg total) by mouth 2 (two) times daily.    . divalproex (DEPAKOTE) 125 MG DR tablet Take 375 mg by mouth 2 (two) times daily.     . ferrous sulfate 324 MG TBEC Take by  mouth.    Marland Kitchen FLUoxetine (PROZAC) 20 MG capsule Take 20 mg by mouth every morning.     Marland Kitchen guaiFENesin (MUCINEX) 600 MG 12 hr tablet Take 600 mg by mouth 2 (two) times daily.    Marland Kitchen ipratropium-albuterol (DUONEB) 0.5-2.5 (3) MG/3ML SOLN Take 3 mLs by nebulization every 6 (six) hours as needed (for SOB; wheezing).    Marland Kitchen KLOR-CON M20 20 MEQ tablet Take 20 mEq by mouth daily.     Marland Kitchen levothyroxine (SYNTHROID) 25 MCG tablet Take 25 mcg by mouth daily.    . magnesium hydroxide (MILK OF MAGNESIA) 400 MG/5ML suspension Take 30 mLs by mouth every morning.    . metoprolol (LOPRESSOR) 50 MG tablet Take 50 mg by mouth 2 (two) times daily.    Marland Kitchen OLANZapine (ZYPREXA) 15 MG tablet Take 15 mg by mouth at bedtime.    . ondansetron (ZOFRAN) 4 MG tablet Take 4 mg by mouth every 6 (six) hours as needed for nausea or vomiting.     . torsemide (DEMADEX) 20 MG tablet Take 20 mg by mouth daily.     No current facility-administered medications for this visit.     Physical Exam: BP 101/65 (BP Location: Left Arm, Patient Position: Sitting)   Pulse 65   Resp 20   Ht 5\' 11"  (  1.803 m)   Wt 200 lb (90.7 kg)   SpO2 97% Comment: RA  BMI 27.89 kg/m  He is a chronically ill appearing gentleman in no distress in his wheelchair. Cardiac exam shows a regular rate and rhythm with normal heart sounds. There is no murmur. Lungs are clear.  Diagnostic Tests:  Narrative & Impression  CLINICAL DATA:  Follow-up thoracic aortic dissection.  EXAM: CT ANGIOGRAPHY CHEST WITH CONTRAST  TECHNIQUE: Multidetector CT imaging of the chest was performed using the standard protocol during bolus administration of intravenous contrast. Multiplanar CT image reconstructions and MIPs were obtained to evaluate the vascular anatomy.  CONTRAST:  147mL OMNIPAQUE IOHEXOL 350 MG/ML SOLN  COMPARISON:  Chest CT-10/23/2018; 04/02/2018; 01/30/2018; 01/25/2018; 09/21/2012  FINDINGS: Vascular Findings:  Interval complete resolution of  previously noted descending thoracic aortic dissection. There is a minimal amount of eccentric wall thickening extending from the distal aspect of the ascending thoracic aorta through the aortic arch to the level of the distal descending thoracic aorta, however the false lumen is now completely thrombosed. No perivascular stranding.  Redemonstrated mild fusiform aneurysmal dilatation of the ascending thoracic aorta with measurements as follows. The ascending thoracic aorta tapers to a normal caliber at the level of the aortic arch. There is a moderate amount eccentric calcified atherosclerotic plaque involving the aortic arch and descending thoracic aorta, not resulting in hemodynamically significant stenosis. No perivascular stranding.  Conventional configuration of the aortic arch. The branch vessels of the aortic arch appear widely patent throughout their imaged courses.  Cardiomegaly. Coronary artery calcifications. Interval development of a moderate-sized pericardial effusion.  Although this examination was not tailored for the evaluation the pulmonary arteries, there are no discrete filling defects within the central pulmonary arterial tree to suggest central pulmonary embolism. Enlarged caliber of the main pulmonary artery measuring 35 mm in diameter.  -------------------------------------------------------------  Thoracic aortic measurements:  SINOTUBULAR JUNCTION: 31 mm as measured in greatest oblique short axis coronal dimension.  PROXIMAL ASCENDING THORACIC AORTA: 37 mm as measured in greatest oblique short axis axial dimension at the level of the main pulmonary artery (image 33, series 4) and approximately 40 mm in greatest oblique short axis coronal diameter (coronal image 68, series 8), similar to the 08/2012 examination.  AORTIC ARCH: 31 mm as measured in greatest oblique short axis sagittal dimension.  PROXIMAL DESCENDING THORACIC AORTA: 30 mm as  measured in greatest oblique short axis axial dimension at the level of the main pulmonary artery.  DISTAL DESCENDING THORACIC AORTA: 31 mm as measured in greatest oblique short axis axial dimension at the level of the diaphragmatic hiatus.  Review of the MIP images confirms the above findings.  -------------------------------------------------------------  Non-Vascular Findings:  Mediastinum/Lymph Nodes: No bulky mediastinal, hilar or axillary lymphadenopathy.  Lungs/Pleura: Minimal bilateral lower lobe subsegmental atelectasis. There is persistent elevation/eventration of the right hemidiaphragm. No discrete focal airspace opacities. No pleural effusion or pneumothorax. The central pulmonary airways appear patent. No discrete pulmonary nodules.  Upper abdomen: Limited early arterial phase evaluation the upper abdomen demonstrates interposition of the hepatic flexure of colon anterior to the liver.  Musculoskeletal: No acute or aggressive osseous abnormalities. Stigmata of dish within the thoracic spine. Mild asymmetric left-sided gynecomastia. Normal appearance of the imaged portions of the thyroid gland.  IMPRESSION: 1. Interval resolution previously noted type B descending thoracic aortic dissection with minimal residual crescentic mural thrombus but with complete thrombosis of previously noted false lumen. 2. Stable uncomplicated mild fusiform aneurysmal dilatation of the ascending thoracic aorta  measuring 41 mm in diameter, similar to remote chest CT performed in 2013. Aortic aneurysm NOS (ICD10-I71.9). 3. Cardiomegaly with development of a moderate-sized pericardial effusion. Further evaluation cardiac echo could performed as clinically indicated. 4. Coronary calcifications.  Aortic aneurysm NOS (ICD10-I71.9).   Electronically Signed   By: Sandi Mariscal M.D.   On: 03/22/2021 15:53      Impression:  He has a healed chronic type B aortic  dissection. The ascending aorta has a diameter of 4.1 cm which is unchanged dating back to 2013. I reviewed the CTA study with the patient and answered his questions. This does not require any treatment at this time. I stressed the importance of good blood pressure control in preventing further enlargement and acute aortic dissection.   Plan:  He will return in 2 years for a repeat CTA of the chest.  I spent 20 minutes performing this established patient evaluation and > 50% of this time was spent face to face counseling and coordinating the care of this patient's chronic type B aortic dissection and ascending aortic aneurysm.    Gaye Pollack, MD [facility] [phone]

## 2021-03-30 LAB — POCT I-STAT CREATININE: Creatinine, Ser: 1.9 mg/dL — ABNORMAL HIGH (ref 0.61–1.24)

## 2021-06-02 ENCOUNTER — Emergency Department (HOSPITAL_COMMUNITY): Payer: Medicare Other

## 2021-06-02 ENCOUNTER — Inpatient Hospital Stay (HOSPITAL_COMMUNITY)
Admission: EM | Admit: 2021-06-02 | Discharge: 2021-06-13 | DRG: 682 | Disposition: A | Discharge status: Skilled Nursing Facility | Payer: Medicare Other | Source: Skilled Nursing Facility | Attending: Family Medicine | Admitting: Family Medicine

## 2021-06-02 ENCOUNTER — Encounter (HOSPITAL_COMMUNITY): Payer: Self-pay | Admitting: Emergency Medicine

## 2021-06-02 ENCOUNTER — Other Ambulatory Visit: Payer: Self-pay

## 2021-06-02 DIAGNOSIS — Z993 Dependence on wheelchair: Secondary | ICD-10-CM

## 2021-06-02 DIAGNOSIS — I482 Chronic atrial fibrillation, unspecified: Secondary | ICD-10-CM | POA: Diagnosis not present

## 2021-06-02 DIAGNOSIS — Z952 Presence of prosthetic heart valve: Secondary | ICD-10-CM

## 2021-06-02 DIAGNOSIS — F250 Schizoaffective disorder, bipolar type: Secondary | ICD-10-CM | POA: Diagnosis not present

## 2021-06-02 DIAGNOSIS — N390 Urinary tract infection, site not specified: Secondary | ICD-10-CM | POA: Diagnosis present

## 2021-06-02 DIAGNOSIS — N179 Acute kidney failure, unspecified: Secondary | ICD-10-CM | POA: Diagnosis present

## 2021-06-02 DIAGNOSIS — Z7189 Other specified counseling: Secondary | ICD-10-CM | POA: Diagnosis not present

## 2021-06-02 DIAGNOSIS — I712 Thoracic aortic aneurysm, without rupture: Secondary | ICD-10-CM | POA: Diagnosis present

## 2021-06-02 DIAGNOSIS — Z515 Encounter for palliative care: Secondary | ICD-10-CM

## 2021-06-02 DIAGNOSIS — Z66 Do not resuscitate: Secondary | ICD-10-CM | POA: Diagnosis present

## 2021-06-02 DIAGNOSIS — E039 Hypothyroidism, unspecified: Secondary | ICD-10-CM | POA: Diagnosis present

## 2021-06-02 DIAGNOSIS — D631 Anemia in chronic kidney disease: Secondary | ICD-10-CM | POA: Diagnosis present

## 2021-06-02 DIAGNOSIS — M109 Gout, unspecified: Secondary | ICD-10-CM | POA: Diagnosis present

## 2021-06-02 DIAGNOSIS — I5032 Chronic diastolic (congestive) heart failure: Secondary | ICD-10-CM | POA: Diagnosis present

## 2021-06-02 DIAGNOSIS — G20 Parkinson's disease: Secondary | ICD-10-CM

## 2021-06-02 DIAGNOSIS — G9341 Metabolic encephalopathy: Secondary | ICD-10-CM | POA: Diagnosis present

## 2021-06-02 DIAGNOSIS — E872 Acidosis: Secondary | ICD-10-CM | POA: Diagnosis present

## 2021-06-02 DIAGNOSIS — F259 Schizoaffective disorder, unspecified: Secondary | ICD-10-CM | POA: Diagnosis present

## 2021-06-02 DIAGNOSIS — I480 Paroxysmal atrial fibrillation: Secondary | ICD-10-CM | POA: Diagnosis present

## 2021-06-02 DIAGNOSIS — F32A Depression, unspecified: Secondary | ICD-10-CM | POA: Diagnosis present

## 2021-06-02 DIAGNOSIS — I739 Peripheral vascular disease, unspecified: Secondary | ICD-10-CM | POA: Diagnosis present

## 2021-06-02 DIAGNOSIS — G20A1 Parkinson's disease without dyskinesia, without mention of fluctuations: Secondary | ICD-10-CM | POA: Diagnosis present

## 2021-06-02 DIAGNOSIS — Z8249 Family history of ischemic heart disease and other diseases of the circulatory system: Secondary | ICD-10-CM

## 2021-06-02 DIAGNOSIS — E875 Hyperkalemia: Secondary | ICD-10-CM | POA: Diagnosis present

## 2021-06-02 DIAGNOSIS — M810 Age-related osteoporosis without current pathological fracture: Secondary | ICD-10-CM | POA: Diagnosis present

## 2021-06-02 DIAGNOSIS — K769 Liver disease, unspecified: Secondary | ICD-10-CM | POA: Diagnosis present

## 2021-06-02 DIAGNOSIS — Z7901 Long term (current) use of anticoagulants: Secondary | ICD-10-CM

## 2021-06-02 DIAGNOSIS — F0390 Unspecified dementia without behavioral disturbance: Secondary | ICD-10-CM | POA: Diagnosis not present

## 2021-06-02 DIAGNOSIS — R059 Cough, unspecified: Secondary | ICD-10-CM | POA: Diagnosis present

## 2021-06-02 DIAGNOSIS — Z79899 Other long term (current) drug therapy: Secondary | ICD-10-CM

## 2021-06-02 DIAGNOSIS — F0150 Vascular dementia without behavioral disturbance: Secondary | ICD-10-CM | POA: Diagnosis present

## 2021-06-02 DIAGNOSIS — Z7401 Bed confinement status: Secondary | ICD-10-CM

## 2021-06-02 DIAGNOSIS — N183 Chronic kidney disease, stage 3 unspecified: Secondary | ICD-10-CM | POA: Diagnosis present

## 2021-06-02 DIAGNOSIS — E785 Hyperlipidemia, unspecified: Secondary | ICD-10-CM | POA: Diagnosis present

## 2021-06-02 DIAGNOSIS — I4821 Permanent atrial fibrillation: Secondary | ICD-10-CM | POA: Diagnosis not present

## 2021-06-02 DIAGNOSIS — I130 Hypertensive heart and chronic kidney disease with heart failure and stage 1 through stage 4 chronic kidney disease, or unspecified chronic kidney disease: Secondary | ICD-10-CM | POA: Diagnosis present

## 2021-06-02 DIAGNOSIS — D6859 Other primary thrombophilia: Secondary | ICD-10-CM | POA: Diagnosis present

## 2021-06-02 DIAGNOSIS — I4891 Unspecified atrial fibrillation: Secondary | ICD-10-CM | POA: Diagnosis present

## 2021-06-02 DIAGNOSIS — Z79890 Hormone replacement therapy (postmenopausal): Secondary | ICD-10-CM

## 2021-06-02 DIAGNOSIS — N1832 Chronic kidney disease, stage 3b: Secondary | ICD-10-CM | POA: Diagnosis not present

## 2021-06-02 DIAGNOSIS — Z20822 Contact with and (suspected) exposure to covid-19: Secondary | ICD-10-CM | POA: Diagnosis present

## 2021-06-02 DIAGNOSIS — R4189 Other symptoms and signs involving cognitive functions and awareness: Secondary | ICD-10-CM | POA: Diagnosis not present

## 2021-06-02 DIAGNOSIS — N200 Calculus of kidney: Secondary | ICD-10-CM | POA: Diagnosis present

## 2021-06-02 LAB — CBC WITH DIFFERENTIAL/PLATELET
Basophils Absolute: 0 10*3/uL (ref 0.0–0.1)
Basophils Relative: 0 %
Eosinophils Absolute: 0 10*3/uL (ref 0.0–0.5)
Eosinophils Relative: 0 %
HCT: 35.9 % — ABNORMAL LOW (ref 39.0–52.0)
Hemoglobin: 11 g/dL — ABNORMAL LOW (ref 13.0–17.0)
Lymphocytes Relative: 12 %
Lymphs Abs: 1.5 10*3/uL (ref 0.7–4.0)
MCH: 28.2 pg (ref 26.0–34.0)
MCHC: 30.6 g/dL (ref 30.0–36.0)
MCV: 92.1 fL (ref 80.0–100.0)
Metamyelocytes Relative: 6 %
Monocytes Absolute: 0.7 10*3/uL (ref 0.1–1.0)
Monocytes Relative: 6 %
Myelocytes: 7 %
Neutro Abs: 8.2 10*3/uL — ABNORMAL HIGH (ref 1.7–7.7)
Neutrophils Relative %: 67 %
Platelets: 222 10*3/uL (ref 150–400)
Promyelocytes Relative: 2 %
RBC: 3.9 MIL/uL — ABNORMAL LOW (ref 4.22–5.81)
RDW: 16.3 % — ABNORMAL HIGH (ref 11.5–15.5)
WBC: 12.3 10*3/uL — ABNORMAL HIGH (ref 4.0–10.5)
nRBC: 0 % (ref 0.0–0.2)

## 2021-06-02 LAB — URINALYSIS, ROUTINE W REFLEX MICROSCOPIC
Bilirubin Urine: NEGATIVE
Glucose, UA: NEGATIVE mg/dL
Ketones, ur: NEGATIVE mg/dL
Nitrite: NEGATIVE
Protein, ur: 30 mg/dL — AB
Specific Gravity, Urine: 1.008 (ref 1.005–1.030)
pH: 6 (ref 5.0–8.0)

## 2021-06-02 LAB — COMPREHENSIVE METABOLIC PANEL
ALT: <5 U/L (ref 0–44)
AST: 9 U/L — ABNORMAL LOW (ref 15–41)
Albumin: 2.2 g/dL — ABNORMAL LOW (ref 3.5–5.0)
Alkaline Phosphatase: 58 U/L (ref 38–126)
Anion gap: 6 (ref 5–15)
BUN: 117 mg/dL — ABNORMAL HIGH (ref 8–23)
CO2: 21 mmol/L — ABNORMAL LOW (ref 22–32)
Calcium: 7.6 mg/dL — ABNORMAL LOW (ref 8.9–10.3)
Chloride: 106 mmol/L (ref 98–111)
Creatinine, Ser: 8.54 mg/dL — ABNORMAL HIGH (ref 0.61–1.24)
GFR, Estimated: 6 mL/min — ABNORMAL LOW (ref >60)
Glucose, Bld: 95 mg/dL (ref 70–99)
Potassium: 5.6 mmol/L — ABNORMAL HIGH (ref 3.5–5.1)
Sodium: 133 mmol/L — ABNORMAL LOW (ref 135–145)
Total Bilirubin: 0.7 mg/dL (ref 0.3–1.2)
Total Protein: 6 g/dL — ABNORMAL LOW (ref 6.5–8.1)

## 2021-06-02 LAB — AMMONIA: Ammonia: 15 umol/L (ref 9–35)

## 2021-06-02 LAB — RESP PANEL BY RT-PCR (FLU A&B, COVID) ARPGX2
Influenza A by PCR: NEGATIVE
Influenza B by PCR: NEGATIVE
SARS Coronavirus 2 by RT PCR: NEGATIVE

## 2021-06-02 LAB — CK: Total CK: 19 U/L — ABNORMAL LOW (ref 49–397)

## 2021-06-02 LAB — MAGNESIUM: Magnesium: 3.2 mg/dL — ABNORMAL HIGH (ref 1.7–2.4)

## 2021-06-02 MED ORDER — HEPARIN SODIUM (PORCINE) 5000 UNIT/ML IJ SOLN
5000.0000 [IU] | Freq: Three times a day (TID) | INTRAMUSCULAR | Status: DC
  Administered 2021-06-03 – 2021-06-13 (×32): 5000 [IU] via SUBCUTANEOUS
  Filled 2021-06-02 (×33): qty 1

## 2021-06-02 MED ORDER — SODIUM ZIRCONIUM CYCLOSILICATE 5 G PO PACK
10.0000 g | PACK | Freq: Once | ORAL | Status: AC
  Administered 2021-06-02: 10 g via ORAL
  Filled 2021-06-02: qty 2

## 2021-06-02 MED ORDER — ACETAMINOPHEN 650 MG RE SUPP: 650.0000 mg | Freq: Four times a day (QID) | RECTAL | Status: DC | PRN

## 2021-06-02 MED ORDER — SODIUM CHLORIDE 0.9% FLUSH
3.0000 mL | Freq: Two times a day (BID) | INTRAVENOUS | Status: DC
  Administered 2021-06-03 – 2021-06-13 (×19): 3 mL via INTRAVENOUS

## 2021-06-02 MED ORDER — ACETAMINOPHEN 325 MG PO TABS: 650.0000 mg | ORAL_TABLET | Freq: Four times a day (QID) | ORAL | Status: DC | PRN

## 2021-06-02 MED ORDER — SODIUM BICARBONATE 8.4 % IV SOLN: 50.0000 meq | Freq: Once | INTRAVENOUS | Status: AC

## 2021-06-02 MED ORDER — CARBIDOPA-LEVODOPA ER 50-200 MG PO TBCR
1.0000 | EXTENDED_RELEASE_TABLET | Freq: Two times a day (BID) | ORAL | Status: DC
  Administered 2021-06-03 – 2021-06-13 (×22): 1 via ORAL
  Filled 2021-06-02 (×26): qty 1

## 2021-06-02 MED ORDER — BISACODYL 5 MG PO TBEC
10.0000 mg | DELAYED_RELEASE_TABLET | Freq: Every day | ORAL | Status: DC
  Administered 2021-06-03 – 2021-06-13 (×11): 10 mg via ORAL
  Filled 2021-06-02 (×11): qty 2

## 2021-06-02 MED ORDER — METOPROLOL TARTRATE 50 MG PO TABS
50.0000 mg | ORAL_TABLET | Freq: Two times a day (BID) | ORAL | Status: DC
  Administered 2021-06-03 – 2021-06-13 (×22): 50 mg via ORAL
  Filled 2021-06-02 (×21): qty 1

## 2021-06-02 MED ORDER — LEVOTHYROXINE SODIUM 112 MCG PO TABS
112.0000 ug | ORAL_TABLET | Freq: Every day | ORAL | Status: DC
  Administered 2021-06-03 – 2021-06-13 (×11): 112 ug via ORAL
  Filled 2021-06-02 (×11): qty 1

## 2021-06-02 MED ORDER — SODIUM CHLORIDE 0.9 % IV SOLN: INTRAVENOUS | Status: AC

## 2021-06-02 MED ORDER — SODIUM CHLORIDE 0.9 % IV BOLUS
1000.0000 mL | Freq: Once | INTRAVENOUS | Status: AC
Start: 2021-06-02 — End: 2021-06-02
  Administered 2021-06-02: 1000 mL via INTRAVENOUS

## 2021-06-02 MED ORDER — SODIUM BICARBONATE 8.4 % IV SOLN
INTRAVENOUS | Status: AC
  Administered 2021-06-02: 50 meq via INTRAVENOUS
  Filled 2021-06-02: qty 50

## 2021-06-02 NOTE — Assessment & Plan Note (Addendum)
Stable. Pt not on any anticoagulation. Not even ASA. Will continue lopressor for now. Assume this is for rate control.

## 2021-06-02 NOTE — H&P (Signed)
History and Physical    BOSIE SUMMIT [id number] DOB: [birth year] DOA: 06/02/2021  PCP: Hilbert Corrigan, MD   Patient coming from: SNF [facility]  I have personally briefly reviewed patient's old medical records in [place]  CC: renal failure HPI: 70 year old male with a history of Parkinson's disease, paroxysmal atrial fibrillation, CKD stage III with a baseline creatinine of 1.9, schizoaffective disorder, paranoid schizophrenia, vascular dementia, bipolar disorder, chronic diastolic heart failure who presents to the ER for acute renal failure.  According to the documentation sent from the nursing home, the patient was started on Invanz via midline on 05/31/2021 for diagnosis of pneumonia.  There is no chest x-ray report that was sent.  He also had a Foley catheter placed on 06/01/2021.  Labs were drawn today at the nursing home and the patient was noted to have a BUN of 115 with a creatinine of 7.5.  Potassium was elevated 6.0.  Bicarbonate was 20.  He was sent to the ER for further evaluation.  On repeat testing here, patient noted to have a potassium of 5.6, BUN of 117, creatinine of 8.5.  Urinalysis demonstrated large cassette esterase with negative nitrites.  Urine specific gravity 1.008.  White count elevated at 12.3.  ED providers discussed the case with nephrology with Dr. Marval Regal.  Nephrology wants the patient admitted to [place] hospitalist contacted for admission.  Patient states that he actually feels okay.  Denies any fever.  No nausea or vomiting.  Patient does not have any family or local contacts.  Patient wants to be a full code.  Patient thinks that he has lived at [place] home for the last 3 years.  He has not walked in several years.  Patient is bedbound.   ED Course: pt states on IVF. Given IV bicarb, po lokelma  Review of Systems:  Review of Systems  Constitutional: Negative.   HENT: Negative.    Eyes: Negative.    Respiratory: Negative.    Cardiovascular: Negative.   Gastrointestinal: Negative.   Genitourinary: Negative.   Musculoskeletal: Negative.   Skin: Negative.   Neurological:        Has not walked in "years"  Endo/Heme/Allergies: Negative.   Psychiatric/Behavioral: Negative.    All other systems reviewed and are negative.  Past Medical History:  Diagnosis Date   A-fib ([place])    Anemia    Atrial fibrillation ([place]) 06/2012   onset 06/2012; normal TSH; EF-45%   CHF (congestive heart failure) ([place])    Cholelithiasis    incidental finding on CT in 2013   Cognitive communication deficit    Dysphagia    Dysphagia    Feeding difficulties    Gout    Hyperlipidemia    Hypertension    minimal coronary atherosclerosis by CT   Hypokalemia    Liver disease    Noncompliance 09/21/2012   Osteoporosis    Overweight(278.02)    Parkinson's disease ([place])    Peripheral vascular disease ([place])    Renal disorder    Schizoaffective disorder ([place])    With depression   Systolic dysfunction    Thoracic aortic aneurysm ([place])    Thrombophilia ([place])    Tinea cruris    Vascular dementia ([place])    Vitamin D deficiency     Past Surgical History:  Procedure Laterality Date   COLONOSCOPY  2006   normal screening examination   prosthetic heart valve       reports that  he has never smoked. He has never used smokeless tobacco. He reports that he does not drink alcohol and does not use drugs.  No Known Allergies  Family History  Problem Relation Age of Onset   Hypertension Mother    Liver disease Father     Prior to Admission medications   Medication Sig Start Date End Date Taking? Authorizing Provider  acetaminophen (TYLENOL) 650 MG CR tablet Take 650 mg by mouth every 8 (eight) hours as needed for pain.    [provider]  alum & mag hydroxide-simeth (MAALOX/MYLANTA) 200-200-20 MG/5ML suspension Take 30 mLs by mouth every 2 (two) hours as needed for indigestion.     [provider]  amiodarone (PACERONE) 200 MG tablet Take 1 tablet (200 mg total) by mouth daily. 02/01/18   Barrett, Erin R, PA-C  amLODipine (NORVASC) 5 MG tablet Take 1 tablet (5 mg total) by mouth every evening. 02/06/18   Kathie Dike, MD  atorvastatin (LIPITOR) 10 MG tablet Take 10 mg by mouth daily.    [provider]  bisacodyl (DULCOLAX) 5 MG EC tablet Take 10 mg by mouth every morning.    [provider]  calcium carbonate (TUMS - DOSED IN MG ELEMENTAL CALCIUM) 500 MG chewable tablet Chew 1 tablet by mouth 3 (three) times daily.    [provider]  carbidopa-levodopa (SINEMET CR) 50-200 MG tablet Take 1 tablet by mouth 2 (two) times daily.    [provider]  Cholecalciferol (VITAMIN D3) 5000 units CAPS Take 1 capsule by mouth every morning.    [provider]  cloNIDine (CATAPRES) 0.2 MG tablet Take 1 tablet (0.2 mg total) by mouth 2 (two) times daily. 01/31/18   Barrett, Erin R, PA-C  divalproex (DEPAKOTE) 125 MG DR tablet Take 375 mg by mouth 2 (two) times daily.     [provider]  ferrous sulfate 324 MG TBEC Take by mouth.    [provider]  FLUoxetine (PROZAC) 20 MG capsule Take 20 mg by mouth every morning.     [provider]  guaiFENesin (MUCINEX) 600 MG 12 hr tablet Take 600 mg by mouth 2 (two) times daily.    [provider]  ipratropium-albuterol (DUONEB) 0.5-2.5 (3) MG/3ML SOLN Take 3 mLs by nebulization every 6 (six) hours as needed (for SOB; wheezing).    [provider]  KLOR-CON M20 20 MEQ tablet Take 20 mEq by mouth daily.  09/18/18   [provider]  levothyroxine (SYNTHROID) 25 MCG tablet Take 25 mcg by mouth daily. 09/25/19   [provider]  magnesium hydroxide (MILK OF MAGNESIA) 400 MG/5ML suspension Take 30 mLs by mouth every morning.    [provider]  metoprolol (LOPRESSOR) 50 MG tablet Take 50 mg by mouth 2 (two) times daily.    [provider]  OLANZapine (ZYPREXA) 15 MG tablet Take 15 mg by mouth at bedtime.    [provider]  ondansetron (ZOFRAN) 4 MG tablet Take 4 mg by mouth every 6 (six) hours as needed for nausea or vomiting.     [provider]  torsemide (DEMADEX) 20 MG tablet Take 20 mg by mouth daily.    [provider]    Physical Exam: Vitals:   06/02/21 1600 06/02/21 1612 06/02/21 1700 06/02/21 1730  BP: (!) 138/91   140/88  Pulse: 73  (!) 48 90  Resp: '18  16 17  '$ Temp:  98.8 F (37.1 C)    TempSrc:  Oral    SpO2: 100%  98% 100%  Weight:      Height:        Physical Exam Vitals and nursing note reviewed.  Constitutional:      General: He is not in acute distress.    Appearance: He is not toxic-appearing or diaphoretic.     Comments: Appears chronically ill  HENT:     Head: Normocephalic and atraumatic.     Comments: Mask-like facies    Nose: Nose normal.  Cardiovascular:     Rate and Rhythm: Normal rate. Rhythm irregular.     Comments: +2/4 right PT pulse  No palpable left PT/DT pulse felt  Both feet are warm and cap refill <2 secs on both feet Pulmonary:     Effort: Pulmonary effort is normal. No respiratory distress.     Breath sounds: No wheezing or rales.  Abdominal:     General: Bowel sounds are normal. There is no distension.     Palpations: Abdomen is soft.     Tenderness: There is no abdominal tenderness. There is no guarding.     Comments: Round abd  Musculoskeletal:     Right lower leg: No edema.     Left lower leg: No edema.     Comments: Bilateral Lower leg atrophy  Skin:    General: Skin is warm and dry.  Neurological:     Mental Status: He is oriented to person, place, and time.     Comments: Mask-like facies Bilateral LE atrophy     Labs on Admission: I have personally reviewed following labs and imaging studies  CBC: Recent Labs  Lab 06/02/21 1627  WBC 12.3*  NEUTROABS 8.2*  HGB 11.0*  HCT 35.9*  MCV 92.1  PLT [id number]    Basic Metabolic Panel: Recent Labs  Lab 06/02/21 1627  NA 133*  K 5.6*  CL 106  CO2 21*  GLUCOSE 95  BUN 117*  CREATININE 8.54*  CALCIUM 7.6*  MG 3.2*   GFR: Estimated Creatinine Clearance: 9.4 mL/min (A) (by C-G formula based on SCr of 8.54 mg/dL (H)). Liver Function Tests: Recent Labs  Lab 06/02/21 1627  AST 9*  ALT <5  ALKPHOS 58  BILITOT 0.7  PROT 6.0*  ALBUMIN 2.2*   No results for input(s): LIPASE, AMYLASE in the last 168 hours. Recent Labs  Lab 06/02/21 1627  AMMONIA 15   Coagulation Profile: No results for input(s): INR, PROTIME in the last 168 hours. Cardiac Enzymes: No results for input(s): CKTOTAL, CKMB, CKMBINDEX, TROPONINI in the last 168 hours. BNP (last 3 results) No results for input(s): PROBNP in the last 8760 hours. HbA1C: No results for input(s): HGBA1C in the last 72 hours. CBG: No results for input(s): GLUCAP in the last 168 hours. Lipid Profile: No results for input(s): CHOL, HDL, LDLCALC, TRIG, CHOLHDL, LDLDIRECT in the last 72 hours. Thyroid Function Tests: No results for input(s): TSH, T4TOTAL, FREET4, T3FREE, THYROIDAB in the last 72 hours. Anemia Panel: No results for input(s): VITAMINB12, FOLATE, FERRITIN, TIBC, IRON, RETICCTPCT in the last 72 hours. Urine analysis:    Component Value Date/Time   COLORURINE STRAW (A) 06/02/2021 [place] 06/02/2021 1614   APPEARANCEUR Clear 11/25/2014 1413   LABSPEC 1.008 06/02/2021 1614   LABSPEC 1.015 11/25/2014 1413   PHURINE 6.0 06/02/2021 1614   GLUCOSEU NEGATIVE 06/02/2021 1614   GLUCOSEU Negative 11/25/2014 1413   HGBUR MODERATE (A) 06/02/2021 1614   BILIRUBINUR NEGATIVE 06/02/2021 1614   BILIRUBINUR  Negative 11/25/2014 1413   KETONESUR NEGATIVE 06/02/2021 1614   PROTEINUR 30 (A) 06/02/2021 1614   UROBILINOGEN 0.2 06/06/2013 0937   NITRITE NEGATIVE 06/02/2021 1614   LEUKOCYTESUR LARGE (A) 06/02/2021 1614   LEUKOCYTESUR Negative 11/25/2014 1413    Radiological  Exams on Admission: I have personally reviewed images CT Head Wo Contrast  Result Date: 06/02/2021 CLINICAL DATA:  Mental status change, unknown cause. EXAM: CT HEAD WITHOUT CONTRAST TECHNIQUE: Contiguous axial images were obtained from the base of the skull through the vertex without intravenous contrast. COMPARISON:  CT September 11, 2016 FINDINGS: Brain: Similar age related global parenchymal volume loss with ex vacuo dilatation of ventricular system. Mild burden of subcortical and periventricular white matter hypodensities, while nonspecific favored to represent chronic ischemic microvascular white matter disease. No evidence of acute large vascular territory infarction, hemorrhage, hydrocephalus, extra-axial collection or mass lesion/mass effect. Vascular: No hyperdense vessel. Atherosclerotic calcifications of the internal carotid and vertebral arteries at the skull base. Skull: Normal. Negative for fracture or focal lesion. Sinuses/Orbits: Visualized portions of the paranasal sinuses and mastoid air cells are predominantly clear. Orbits are unremarkable. Other: None IMPRESSION: 1. No acute intracranial findings. 2. Age related global parenchymal volume loss and chronic ischemic microvascular white matter disease. Electronically Signed   By: Dahlia Bailiff MD   On: 06/02/2021 18:12   DG Chest Port 1 View  Result Date: 06/02/2021 CLINICAL DATA:  Cough EXAM: PORTABLE CHEST 1 VIEW COMPARISON:  05/12/2018 FINDINGS: Low lung volumes with elevated right diaphragm. Cardiomegaly with central bronchovascular crowding. No consolidation, pleural effusion or pneumothorax. Aortic atherosclerosis. IMPRESSION: No active disease.  Cardiomegaly with low lung volumes. Electronically Signed   By: Donavan Foil M.D.   On: 06/02/2021 17:12    EKG: I have personally reviewed EKG: afib    Assessment/Plan Principal Problem:   Acute renal failure (ARF) (HCC) Active Problems:   Schizoaffective disorder (HCC)   Chronic  kidney disease, stage III (moderate) (HCC) - baseline SCr 1.9   Parkinson's disease (HCC)   Atrial fibrillation (HCC)   Chronic diastolic CHF (congestive heart failure) ([place])   Bedbound    Acute renal failure (ARF) ([place]) Admit to telemetry bed. Continue with gentle IVF with NS. Continue with foley catheter to monitor urine output. CT urogram pending. Avoid nephrotoxic agents. No urgent need for dialysis. If BUN/Scr do not improve in next 24-48 with IVF, pt may need to start dialysis. Nephrology to see patient in the AM. Stop Invanz. Check urine for eosinophils. Pt's acute renal failure could be interstitial nephritis related to IV Invanz.  Schizoaffective disorder ([place]) Given renal failure, will need to hold mental health meds for now.  Chronic kidney disease, stage III (moderate) (HCC) - baseline SCr 1.9 Baseline Scr seems to be 1.9 but we do not have any accurate records as the last BMP was an iSTAT.  Parkinson's disease ([place]) Chronic. Can continue his sinemet  Atrial fibrillation (HCC) Stable. Pt not on any anticoagulation. Not even ASA. Will continue lopressor for now. Assume this is for rate control.  Chronic diastolic CHF (congestive heart failure) (HCC) Pt on torsemide 20 mg daily at [facility]. Will need to hold for ARF. Continue with lopressor.  Bedbound Chronic. Pt states he has not walked in years.  DVT prophylaxis: SQ Heparin Code Status: Full Code Family Communication: no family at bedside. Pt states he has no emergency contacts  Disposition Plan: return to [facility] called: EDP has called consult to nephrology. Dr. Marval Regal  who will see patient in the AM Admission status: Inpatient, Telemetry bed   Kristopher Oppenheim, DO [name] 06/02/2021, 7:27 PM

## 2021-06-02 NOTE — Assessment & Plan Note (Signed)
Chronic. Can continue his sinemet

## 2021-06-02 NOTE — Assessment & Plan Note (Signed)
Baseline Scr seems to be 1.9 but we do not have any accurate records as the last BMP was an iSTAT.

## 2021-06-02 NOTE — ED Triage Notes (Signed)
Pt arrived by [facility] from [facility] for abnormal labs. Pt creat. 7. Pt has stage 3 kidney disease and acute kidney injury. EMS states pt has picc line and is currently getting antibiotics due to pneumonia.

## 2021-06-02 NOTE — Subjective & Objective (Addendum)
CC: renal failure HPI: 70 year old male with a history of Parkinson's disease, paroxysmal atrial fibrillation, CKD stage III with a baseline creatinine of 1.9, schizoaffective disorder, paranoid schizophrenia, vascular dementia, bipolar disorder, chronic diastolic heart failure who presents to the ER for acute renal failure.  According to the documentation sent from the nursing home, the patient was started on Invanz via midline on 05/31/2021 for diagnosis of pneumonia.  There is no chest x-ray report that was sent.  He also had a Foley catheter placed on 06/01/2021.  Labs were drawn today at the nursing home and the patient was noted to have a BUN of 115 with a creatinine of 7.5.  Potassium was elevated 6.0.  Bicarbonate was 20.  He was sent to the ER for further evaluation.  On repeat testing here, patient noted to have a potassium of 5.6, BUN of 117, creatinine of 8.5.  Urinalysis demonstrated large cassette esterase with negative nitrites.  Urine specific gravity 1.008.  White count elevated at 12.3.  ED providers discussed the case with nephrology with Dr. Marval Regal.  Nephrology wants the patient admitted to [facility] hospitalist contacted for admission.  Patient states that he actually feels okay.  Denies any fever.  No nausea or vomiting.  Patient does not have any family or local contacts.  Patient wants to be a full code.  Patient thinks that he has lived at [facility] home for the last 3 years.  He has not walked in several years.  Patient is bedbound.

## 2021-06-02 NOTE — Assessment & Plan Note (Signed)
Given renal failure, will need to hold mental health meds for now.

## 2021-06-02 NOTE — ED Notes (Signed)
Pt arrived to ER with a Foley in place.

## 2021-06-02 NOTE — Assessment & Plan Note (Signed)
Chronic. Pt states he has not walked in years.

## 2021-06-02 NOTE — ED Provider Notes (Signed)
[facility] EMERGENCY DEPARTMENT Provider Note   CSN: [id number] Arrival date & time: 06/02/21  1525     History Chief Complaint  Patient presents with   abnomal lab    Todd Page is a 70 y.o. male.  HPI  Level 5 caveat altered mental status.  Patient with significant medical history of A. fib currently on anticoagulant, CHF, cognitive communication deficits, hypertension, hyperlipidemia, liver disease, Parkinson's disease PVD, renal disorder presents to the emergency department due to elevated creatinine and potassium.  Patient was sent by his primary care provider Dr.Goforth, who I briefly spoke with, it was noted today patient had a creatinine of 7 baseline was 1.5 and potassium of 6, has a history of stage III kidney disease unclear etiology recent increase in creatinine, a Foley was placed due to elevation in creatinine.  He is currently being treated for pneumonia as well as a UT currently taking ertapenem which she gets daily through his PICC line noted on his left arm, at baseline is slightly altered oriented x3, he is a DNR/DNI and is bed ridden at baseline.     Past Medical History:  Diagnosis Date   A-fib ([place])    Anemia    Atrial fibrillation ([place]) 06/2012   onset 06/2012; normal TSH; EF-45%   CHF (congestive heart failure) ([place])    Cholelithiasis    incidental finding on CT in 2013   Cognitive communication deficit    Dysphagia    Dysphagia    Feeding difficulties    Gout    Hyperlipidemia    Hypertension    minimal coronary atherosclerosis by CT   Hypokalemia    Liver disease    Noncompliance 09/21/2012   Osteoporosis    Overweight(278.02)    Parkinson's disease ([place])    Peripheral vascular disease ([place])    Renal disorder    Schizoaffective disorder ([place])    With depression   Systolic dysfunction    Thoracic aortic aneurysm ([place])    Thrombophilia ([place])    Tinea cruris    Vascular dementia ([place])    Vitamin D deficiency     Patient Active Problem  List   Diagnosis Date Noted   Acute renal failure (ARF) ([place]) 06/02/2021   Bedbound 06/02/2021   Sepsis secondary to UTI ([place]) 05/12/2018   Hyperlipidemia 02/06/2018   Hypertensive urgency 02/05/2018   Parkinson's disease ([place]) 02/05/2018   Chronic diastolic CHF (congestive heart failure) ([place]) 02/05/2018   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[place]) 01/25/2018   Hyperglycemia 03/19/2014   CHF (congestive heart failure) ([place]) 03/19/2014   Acute exacerbation of congestive heart failure ([place]) 03/18/2014   Acute on chronic heart failure ([place]) 03/18/2014   Chest pain 06/08/2013   Tachypnea [id number]   Acute diastolic congestive heart failure ([place]) 05/25/2013   Chronic kidney disease, stage III (moderate) (HCC) - baseline SCr 1.9 05/25/2013   Acute gout 05/25/2013   Elevated troponin 05/18/2013   Acute respiratory failure ([place]) 02/20/2013   Lower extremity edema 02/17/2013   Acute on chronic systolic HF (heart failure) ([place]) 12/25/2012   Atrial fibrillation with rapid ventricular response ([place]) 12/24/2012   Malignant hypertension 12/24/2012   Dyspnea 12/24/2012   Bilateral leg pain 12/24/2012   Hypokalemia 12/24/2012   Noncompliance 09/21/2012   Hypertension 09/21/2012   Atrial fibrillation ([place]) 07/26/2012   Obesity 07/26/2012   Schizoaffective disorder ([place]) 07/26/2012    Past Surgical History:  Procedure Laterality Date   COLONOSCOPY  2006   normal screening examination  prosthetic heart valve         Family History  Problem Relation Age of Onset   Hypertension Mother    Liver disease Father     Social History   Tobacco Use   Smoking status: Never   Smokeless tobacco: Never  Vaping Use   Vaping Use: Never used  Substance Use Topics   Alcohol use: No    Comment: former    Drug use: No    Home Medications Prior to Admission medications   Medication Sig Start Date End Date Taking? Authorizing Provider  acetaminophen (TYLENOL) 650 MG CR tablet Take  650 mg by mouth every 8 (eight) hours as needed for pain.    [provider]  alum & mag hydroxide-simeth (MAALOX/MYLANTA) 200-200-20 MG/5ML suspension Take 30 mLs by mouth every 2 (two) hours as needed for indigestion.     [provider]  amiodarone (PACERONE) 200 MG tablet Take 1 tablet (200 mg total) by mouth daily. 02/01/18   Barrett, Erin R, PA-C  amLODipine (NORVASC) 5 MG tablet Take 1 tablet (5 mg total) by mouth every evening. 02/06/18   Kathie Dike, MD  atorvastatin (LIPITOR) 10 MG tablet Take 10 mg by mouth daily.    [provider]  bisacodyl (DULCOLAX) 5 MG EC tablet Take 10 mg by mouth every morning.    [provider]  calcium carbonate (TUMS - DOSED IN MG ELEMENTAL CALCIUM) 500 MG chewable tablet Chew 1 tablet by mouth 3 (three) times daily.    [provider]  carbidopa-levodopa (SINEMET CR) 50-200 MG tablet Take 1 tablet by mouth 2 (two) times daily.    [provider]  Cholecalciferol (VITAMIN D3) 5000 units CAPS Take 1 capsule by mouth every morning.    [provider]  cloNIDine (CATAPRES) 0.2 MG tablet Take 1 tablet (0.2 mg total) by mouth 2 (two) times daily. 01/31/18   Barrett, Erin R, PA-C  divalproex (DEPAKOTE) 125 MG DR tablet Take 375 mg by mouth 2 (two) times daily.     [provider]  ferrous sulfate 324 MG TBEC Take by mouth.    [provider]  FLUoxetine (PROZAC) 20 MG capsule Take 20 mg by mouth every morning.     [provider]  guaiFENesin (MUCINEX) 600 MG 12 hr tablet Take 600 mg by mouth 2 (two) times daily.    [provider]  ipratropium-albuterol (DUONEB) 0.5-2.5 (3) MG/3ML SOLN Take 3 mLs by nebulization every 6 (six) hours as needed (for SOB; wheezing).    [provider]  KLOR-CON M20 20 MEQ tablet Take 20 mEq by mouth daily.  09/18/18   [provider]  levothyroxine (SYNTHROID) 25 MCG tablet Take 25 mcg by mouth daily. 09/25/19   [provider]  magnesium hydroxide (MILK OF MAGNESIA) 400 MG/5ML suspension Take 30 mLs by mouth every morning.    [provider]  metoprolol (LOPRESSOR) 50 MG tablet Take 50 mg by mouth 2 (two) times daily.    [provider]  OLANZapine (ZYPREXA) 15 MG tablet Take 15 mg by mouth at bedtime.    [provider]  ondansetron (ZOFRAN) 4 MG tablet Take 4 mg by mouth every 6 (six) hours as needed for nausea or vomiting.     [provider]  torsemide (DEMADEX) 20 MG tablet Take 20 mg by mouth daily.    [provider]    Allergies    Patient has no known allergies.  Review  of Systems   Review of Systems  Unable to perform ROS: Mental status change   Physical Exam Updated Vital Signs BP 140/88   Pulse 90   Temp 98.8 F (37.1 C) (Oral)   Resp 17   Ht '5\' 11"'$  (1.803 m)   Wt 91 kg   SpO2 100%   BMI 27.98 kg/m   Physical Exam Vitals and nursing note reviewed.  Constitutional:      General: He is not in acute distress.    Appearance: He is ill-appearing.     Comments: Deconditioned state, chronically on 2 L via nasal cannula.   HENT:     Head: Normocephalic and atraumatic.     Nose: No congestion.     Mouth/Throat:     Mouth: Mucous membranes are moist.     Pharynx: Oropharynx is clear.  Eyes:     Extraocular Movements: Extraocular movements intact.     Conjunctiva/sclera: Conjunctivae normal.     Pupils: Pupils are equal, round, and reactive to light.  Cardiovascular:     Rate and Rhythm: Normal rate and regular rhythm.     Pulses: Normal pulses.     Heart sounds: No murmur heard.   No friction rub. No gallop.  Pulmonary:     Effort: No respiratory distress.     Breath sounds: Rales present. No wheezing or rhonchi.     Comments: Bibasilar Rales worse on the right versus the left Abdominal:     Palpations: Abdomen is soft.     Tenderness: There is no abdominal tenderness.  Musculoskeletal:        General: No swelling.      Right lower leg: No edema.     Left lower leg: No edema.  Skin:    General: Skin is warm and dry.     Comments: Patient has a noted PICC line in the left arm, no surrounding erythema or edema no fluctuant induration present.  Neurological:     Mental Status: He is alert.     Comments: Alert and oriented x3, slightly somnolent on my exam but arousable, no facial asymmetry, no difficulty with word finding, no slurring of his words, generalized weakness 2 out of 5 strength in the upper and lower extremities, no unilateral weakness.  Unable to assess coordination due to weakness unable to ambulate due to weakness.  Psychiatric:        Mood and Affect: Mood normal.    ED Results / Procedures / Treatments   Labs (all labs ordered are listed, but only abnormal results are displayed) Labs Reviewed  COMPREHENSIVE METABOLIC PANEL - Abnormal; Notable for the following components:      Result Value   Sodium 133 (*)    Potassium 5.6 (*)    CO2 21 (*)    BUN 117 (*)    Creatinine, Ser 8.54 (*)    Calcium 7.6 (*)    Total Protein 6.0 (*)    Albumin 2.2 (*)    AST 9 (*)    GFR, Estimated 6 (*)    All other components within normal limits  CBC WITH DIFFERENTIAL/PLATELET - Abnormal; Notable for the following components:   WBC 12.3 (*)    RBC 3.90 (*)    Hemoglobin 11.0 (*)    HCT 35.9 (*)    RDW 16.3 (*)    Neutro Abs 8.2 (*)    All other components within normal limits  MAGNESIUM - Abnormal; Notable for the following components:   Magnesium  3.2 (*)    All other components within normal limits  URINALYSIS, ROUTINE W REFLEX MICROSCOPIC - Abnormal; Notable for the following components:   Color, Urine STRAW (*)    Hgb urine dipstick MODERATE (*)    Protein, ur 30 (*)    Leukocytes,Ua LARGE (*)    Bacteria, UA RARE (*)    All other components within normal limits  URINE CULTURE  RESP PANEL BY RT-PCR (FLU A&B, COVID) ARPGX2  AMMONIA  CK  I-STAT VENOUS BLOOD GAS, ED  CYTOLOGY - NON PAP     EKG EKG Interpretation  Date/Time:  Friday June 02 2021 15:51:41 EDT Ventricular Rate:  86 PR Interval:    QRS Duration: 103 QT Interval:  419 QTC Calculation: 502 R Axis:   65 Text Interpretation: Atrial fibrillation Low voltage, extremity leads Prolonged QT interval Confirmed by Sherwood Gambler [phone]) on 06/02/2021 4:21:13 PM  Radiology CT Head Wo Contrast  Result Date: 06/02/2021 CLINICAL DATA:  Mental status change, unknown cause. EXAM: CT HEAD WITHOUT CONTRAST TECHNIQUE: Contiguous axial images were obtained from the base of the skull through the vertex without intravenous contrast. COMPARISON:  CT September 11, 2016 FINDINGS: Brain: Similar age related global parenchymal volume loss with ex vacuo dilatation of ventricular system. Mild burden of subcortical and periventricular white matter hypodensities, while nonspecific favored to represent chronic ischemic microvascular white matter disease. No evidence of acute large vascular territory infarction, hemorrhage, hydrocephalus, extra-axial collection or mass lesion/mass effect. Vascular: No hyperdense vessel. Atherosclerotic calcifications of the internal carotid and vertebral arteries at the skull base. Skull: Normal. Negative for fracture or focal lesion. Sinuses/Orbits: Visualized portions of the paranasal sinuses and mastoid air cells are predominantly clear. Orbits are unremarkable. Other: None IMPRESSION: 1. No acute intracranial findings. 2. Age related global parenchymal volume loss and chronic ischemic microvascular white matter disease. Electronically Signed   By: Dahlia Bailiff MD   On: 06/02/2021 18:12   DG Chest Port 1 View  Result Date: 06/02/2021 CLINICAL DATA:  Cough EXAM: PORTABLE CHEST 1 VIEW COMPARISON:  05/12/2018 FINDINGS: Low lung volumes with elevated right diaphragm. Cardiomegaly with central bronchovascular crowding. No consolidation, pleural effusion or pneumothorax. Aortic atherosclerosis. IMPRESSION: No active  disease.  Cardiomegaly with low lung volumes. Electronically Signed   By: Donavan Foil M.D.   On: 06/02/2021 17:12    Procedures .Critical Care  Date/Time: 06/02/2021 7:26 PM Performed by: Marcello Fennel, PA-C Authorized by: Marcello Fennel, PA-C   Critical care provider statement:    Critical care time (minutes):  45   Critical care time was exclusive of:  Separately billable procedures and treating other patients   Critical care was necessary to treat or prevent imminent or life-threatening deterioration of the following conditions:  Metabolic crisis   Critical care was time spent personally by me on the following activities:  Discussions with consultants, evaluation of patient's response to treatment, examination of patient, ordering and performing treatments and interventions, ordering and review of laboratory studies, ordering and review of radiographic studies, pulse oximetry, re-evaluation of patient's condition, obtaining history from patient or surrogate and review of old charts   I assumed direction of critical care for this patient from another provider in my specialty: no     Care discussed with: admitting provider     Medications Ordered in ED Medications  sodium zirconium cyclosilicate (LOKELMA) packet 10 g (10 g Oral Given 06/02/21 1820)  sodium bicarbonate injection 50 mEq (50 mEq Intravenous Given 06/02/21 1816)  sodium chloride 0.9 % bolus 1,000 mL (0 mLs Intravenous Stopped 06/02/21 1928)    ED Course  I have reviewed the triage vital signs and the nursing notes.  Pertinent labs & imaging results that were available during my care of the patient were reviewed by me and considered in my medical decision making (see chart for details).    MDM Rules/Calculators/A&P                          Initial impression-patient presents with abnormal lab work.  He is alert x3, slightly somnolent but arousable on exam, does not appear in acute distress, vital signs reassuring.   Will obtain basic lab work-up, and reassess the patient.  Work-up-CBC shows slight leukocytosis of 12.3, normocytic anemia with a hemoglobin of 11, CMP shows hyponatremia 133, hyper-K of 5.6, decreased CO2 of 21, BUN 117, creatinine 8.5 baseline appears to be 1.5-2, ammonia level 15, UA shows large leukocytes, red blood cells, white blood cells, rare bacteria.  Magnesium 3.2 CT head negative for acute findings.  Chest x-ray negative for acute findings.  Reassessment-patient has a elevated creatinine above his baseline 8.5 as well as hyperkalemia 5.,  he does have prolonged QT's but this appears to be a baseline for patient, he also has a magnesium 3.2.  Will start him on fluids, provide him with an amp of bicarb and Lokelma.  will also speak with nephrology for further recommendations.  Updated patient on recommendations from nephrology, patient is in agreement this plan.  We will go ahead and admit the patient.  Patient is reassessed, vital signs remained stable, has no complaints this time.  Consult-  spoke with Dr. Marval Regal of nephrology, he agrees with current treatment plan, he would add on a kidney ultrasound for further evaluation.  His team will assess the patient in the morning.  Admit to medicine. 2.  Spoke with Dr. Bridgett Larsson who will admit the patient.  Rule out-low suspicion for intracranial head bleed and/or CVA I no noted abnormality seen on CT head, there is no focal deficits present my exam.  He is noted be slightly lethargic but I suspect this is likely due to acute renal failure with elevated BUN.  Low suspicion for hepatic encephalopathy as ammonia levels are within normal limits, liver enzymes within normal limits.  Low suspicion for systemic infection as patient is nontoxic-appearing, vital signs are reassuring, he does have a slight elevation in his white count but I suspect this might be a acute phase reactant versus ongoing UTI as his urine is consistent for UTI.  Low suspicion  for postrenal abnormality as patient has a Foley in place which is actively draining.  I have low suspicion for kidney stones as he has no flank pain, no gross amount of hematuria.  He does have red blood cells noted on UA but I suspect this is likely from the Foley cath in place.  Plan-admit to medicine due to AKI with metabolic encephalopathy likely from hyper uremia. Final Clinical Impression(s) / ED Diagnoses Final diagnoses:  AKI (acute kidney injury) ([place])  Metabolic encephalopathy    Rx / DC Orders ED Discharge Orders     None        Marcello Fennel, PA-C 06/02/21 1929    Sherwood Gambler, MD 06/02/21 2355

## 2021-06-02 NOTE — Assessment & Plan Note (Signed)
[facility] on [facility]ide [facility] daily at [facility]. Will need to hold for ARF. Continue with lopressor.

## 2021-06-02 NOTE — Assessment & Plan Note (Signed)
Admit to telemetry bed. Continue with gentle IVF with NS. Continue with foley catheter to monitor urine output. CT urogram pending. Avoid nephrotoxic agents. No urgent need for dialysis. If BUN/Scr do not improve in next 24-48 with IVF, pt may need to start dialysis. Nephrology to see patient in the AM. Stop Invanz. Check urine for eosinophils. Pt's acute renal failure could be interstitial nephritis related to IV Invanz.

## 2021-06-03 ENCOUNTER — Inpatient Hospital Stay (HOSPITAL_COMMUNITY): Payer: Medicare Other

## 2021-06-03 DIAGNOSIS — I482 Chronic atrial fibrillation, unspecified: Secondary | ICD-10-CM

## 2021-06-03 DIAGNOSIS — N1832 Chronic kidney disease, stage 3b: Secondary | ICD-10-CM

## 2021-06-03 LAB — CBC WITH DIFFERENTIAL/PLATELET
Abs Immature Granulocytes: 1.95 10*3/uL — ABNORMAL HIGH (ref 0.00–0.07)
Band Neutrophils: 3 %
Basophils Absolute: 0 10*3/uL (ref 0.0–0.1)
Basophils Relative: 0 %
Eosinophils Absolute: 0.1 10*3/uL (ref 0.0–0.5)
Eosinophils Relative: 1 %
HCT: 32.7 % — ABNORMAL LOW (ref 39.0–52.0)
Hemoglobin: 10.2 g/dL — ABNORMAL LOW (ref 13.0–17.0)
Lymphocytes Relative: 19 %
Lymphs Abs: 2.5 10*3/uL (ref 0.7–4.0)
MCH: 28.5 pg (ref 26.0–34.0)
MCHC: 31.2 g/dL (ref 30.0–36.0)
MCV: 91.3 fL (ref 80.0–100.0)
Metamyelocytes Relative: 2 %
Monocytes Absolute: 0.7 10*3/uL (ref 0.1–1.0)
Monocytes Relative: 5 %
Myelocytes: 1 %
Neutro Abs: 9.4 10*3/uL — ABNORMAL HIGH (ref 1.7–7.7)
Neutrophils Relative %: 69 %
Platelets: 201 10*3/uL (ref 150–400)
RBC: 3.58 MIL/uL — ABNORMAL LOW (ref 4.22–5.81)
RDW: 15.9 % — ABNORMAL HIGH (ref 11.5–15.5)
WBC: 13 10*3/uL — ABNORMAL HIGH (ref 4.0–10.5)
nRBC: 0 % (ref 0.0–0.2)

## 2021-06-03 LAB — RENAL FUNCTION PANEL
Albumin: 2.1 g/dL — ABNORMAL LOW (ref 3.5–5.0)
Anion gap: 7 (ref 5–15)
BUN: 119 mg/dL — ABNORMAL HIGH (ref 8–23)
CO2: 19 mmol/L — ABNORMAL LOW (ref 22–32)
Calcium: 7.4 mg/dL — ABNORMAL LOW (ref 8.9–10.3)
Chloride: 109 mmol/L (ref 98–111)
Creatinine, Ser: 8.49 mg/dL — ABNORMAL HIGH (ref 0.61–1.24)
GFR, Estimated: 6 mL/min — ABNORMAL LOW (ref >60)
Glucose, Bld: 71 mg/dL (ref 70–99)
Phosphorus: 6.2 mg/dL — ABNORMAL HIGH (ref 2.5–4.6)
Potassium: 6 mmol/L — ABNORMAL HIGH (ref 3.5–5.1)
Sodium: 135 mmol/L (ref 135–145)

## 2021-06-03 LAB — TSH: TSH: 2.746 u[IU]/mL (ref 0.350–4.500)

## 2021-06-03 LAB — COMPREHENSIVE METABOLIC PANEL
ALT: <5 U/L (ref 0–44)
AST: 8 U/L — ABNORMAL LOW (ref 15–41)
Albumin: 2.1 g/dL — ABNORMAL LOW (ref 3.5–5.0)
Alkaline Phosphatase: 48 U/L (ref 38–126)
Anion gap: 5 (ref 5–15)
BUN: 120 mg/dL — ABNORMAL HIGH (ref 8–23)
CO2: 21 mmol/L — ABNORMAL LOW (ref 22–32)
Calcium: 7.5 mg/dL — ABNORMAL LOW (ref 8.9–10.3)
Chloride: 109 mmol/L (ref 98–111)
Creatinine, Ser: 8.42 mg/dL — ABNORMAL HIGH (ref 0.61–1.24)
GFR, Estimated: 6 mL/min — ABNORMAL LOW (ref >60)
Glucose, Bld: 78 mg/dL (ref 70–99)
Potassium: 6 mmol/L — ABNORMAL HIGH (ref 3.5–5.1)
Sodium: 135 mmol/L (ref 135–145)
Total Bilirubin: 0.6 mg/dL (ref 0.3–1.2)
Total Protein: 5.6 g/dL — ABNORMAL LOW (ref 6.5–8.1)

## 2021-06-03 LAB — HIV ANTIBODY (ROUTINE TESTING W REFLEX): HIV Screen 4th Generation wRfx: NONREACTIVE

## 2021-06-03 LAB — MAGNESIUM: Magnesium: 3 mg/dL — ABNORMAL HIGH (ref 1.7–2.4)

## 2021-06-03 MED ORDER — CHLORHEXIDINE GLUCONATE CLOTH 2 % EX PADS
6.0000 | MEDICATED_PAD | Freq: Every day | CUTANEOUS | Status: DC
  Administered 2021-06-04 – 2021-06-13 (×10): 6 via TOPICAL

## 2021-06-03 MED ORDER — ATORVASTATIN CALCIUM 10 MG PO TABS
10.0000 mg | ORAL_TABLET | Freq: Every day | ORAL | Status: DC
  Administered 2021-06-03 – 2021-06-12 (×10): 10 mg via ORAL
  Filled 2021-06-03 (×10): qty 1

## 2021-06-03 MED ORDER — AMIODARONE HCL 200 MG PO TABS
200.0000 mg | ORAL_TABLET | Freq: Every day | ORAL | Status: DC
  Administered 2021-06-03 – 2021-06-12 (×10): 200 mg via ORAL
  Filled 2021-06-03 (×10): qty 1

## 2021-06-03 MED ORDER — SODIUM ZIRCONIUM CYCLOSILICATE 10 G PO PACK
10.0000 g | PACK | Freq: Two times a day (BID) | ORAL | Status: DC
  Administered 2021-06-03 – 2021-06-06 (×7): 10 g via ORAL
  Filled 2021-06-03 (×7): qty 1

## 2021-06-03 MED ORDER — SODIUM BICARBONATE 8.4 % IV SOLN
50.0000 meq | Freq: Once | INTRAVENOUS | Status: AC
  Administered 2021-06-03: 50 meq via INTRAVENOUS
  Filled 2021-06-03: qty 50

## 2021-06-03 MED ORDER — SODIUM CHLORIDE 0.9 % IV SOLN: INTRAVENOUS | Status: AC

## 2021-06-03 MED ORDER — OLANZAPINE 5 MG PO TABS
15.0000 mg | ORAL_TABLET | Freq: Every day | ORAL | Status: DC
  Administered 2021-06-03 – 2021-06-12 (×10): 15 mg via ORAL
  Filled 2021-06-03 (×10): qty 3

## 2021-06-03 MED ORDER — DIVALPROEX SODIUM 250 MG PO DR TAB
375.0000 mg | DELAYED_RELEASE_TABLET | Freq: Two times a day (BID) | ORAL | Status: DC
  Administered 2021-06-04 – 2021-06-13 (×19): 375 mg via ORAL
  Filled 2021-06-03 (×23): qty 1

## 2021-06-03 NOTE — Progress Notes (Signed)
PROGRESS NOTE    Todd Page  [id number] DOB: [birth year] DOA: 06/02/2021 PCP: Hilbert Corrigan, MD   Chief Complaint  Patient presents with   abnomal lab    Brief admission narrative:  As per H&P written by Dr. Bridgett Larsson on 06/02/2021 70 year old male with a history of Parkinson's disease, paroxysmal atrial fibrillation, CKD stage III with a baseline creatinine of 1.9, schizoaffective disorder, paranoid schizophrenia, vascular dementia, bipolar disorder, chronic diastolic heart failure who presents to the ER for acute renal failure.  According to the documentation sent from the nursing home, the patient was started on Invanz via midline on 05/31/2021 for diagnosis of pneumonia.  There is no chest x-ray report that was sent.  He also had a Foley catheter placed on 06/01/2021.  Labs were drawn today at the nursing home and the patient was noted to have a BUN of 115 with a creatinine of 7.5.  Potassium was elevated 6.0.  Bicarbonate was 20.  He was sent to the ER for further evaluation.   On repeat testing here, patient noted to have a potassium of 5.6, BUN of 117, creatinine of 8.5.   Urinalysis demonstrated large cassette esterase with negative nitrites.  Urine specific gravity 1.008.  White count elevated at 12.3.  Assessment & Plan: 1-hyperkalemia/acute on chronic renal failure (ARF) (HCC) -Stage IIIb at baseline -Unclear etiology at this moment; but with concern for nephrotoxic agent and use of contrast. -Nephrology service on board and assisting with work-up -Continue to closely follow renal function trend -Continue treatment of hyperkalemia using Lokelma and sodium bicarbonate. -Patient with Foley catheter in place (placed prior to admission); CT not demonstrating hydronephrosis or obstruction.  Good amount of urine appreciated inside Foley bag. -Continue closely following the strict I's and O's. -Followed by renal service not a candidate for long-term hemodialysis; palliative care  consultation has been recommended.  2-Atrial fibrillation ([place]) -Rate controlled -Prior to admission no using anticoagulation -Continue the use of amiodarone and metoprolol.  3-Schizoaffective disorder (HCC) -Mood overall stable -Continue supportive care, consult reorientation and nightly olanzapine.  4-Parkinson's disease ([place]) -Continue Sinemet. -Patient wheelchair-bound at baseline.  5-Chronic diastolic CHF (congestive heart failure) (HCC) -Stable and compensated -Continue to follow daily weights and strict I's and O's -Holding diuretics in the setting of worsening renal function at this time. -Continue beta-blocker.  6-essential hypertension -Continue to closely follow vital sign -Blood pressure stable currently -Avoid hypotension.   DVT prophylaxis: Heparin Code Status: Full code. Family Communication: No family at bedside. Disposition:   Status is: Inpatient  Remains inpatient appropriate because:IV treatments appropriate due to intensity of illness or inability to take PO  Dispo: The patient is from: SNF              Anticipated d/c is to: SNF              Patient currently is not medically stable to d/c.   Difficult to place patient No       Consultants:  Nephrology service  Procedures:  See below for x-ray reports  Antimicrobials:  None   Subjective: No overnight events; denies chest pain, no nausea, no vomiting.  Objective: Vitals:   06/02/21 1730 06/02/21 2140 06/02/21 2200 06/03/21 0551  BP: 140/88 (!) 157/98 (!) 144/96 (!) 146/88  Pulse: 90 90 94 96  Resp: 17 20 (!) 22 19  Temp:  98.4 F (36.9 C) 97.8 F (36.6 C) 97.6 F (36.4 C)  TempSrc:  Oral Axillary Oral  SpO2: 100%  98% 98% 98%  Weight:   97.4 kg   Height:   '5\' 11"'$  (1.803 m)     Intake/Output Summary (Last 24 hours) at 06/03/2021 1804 Last data filed at 06/03/2021 1300 Gross per 24 hour  Intake 2471.72 ml  Output --  Net 2471.72 ml   Filed Weights   06/02/21 1534 06/02/21  2200  Weight: 91 kg 97.4 kg    Examination:  General exam: Afebrile, no chest pain, no nausea, no vomiting.  Denies abdominal pain.  Expressed intermittent episodes of difficulty urinating prior to admission. Respiratory system: Clear to auscultation. Respiratory effort normal.  No using accessory muscle.  Good saturation on room air. Cardiovascular system: Rate controlled, no rubs, no gallops, no JVD. Gastrointestinal system: Abdomen is nondistended, soft and nontender. No organomegaly or masses felt. Normal bowel sounds heard. Central nervous system: No new focal neurological deficits. Extremities: No cyanosis or clubbing; no edema appreciated. Skin: No petechiae. Psychiatry: Mood & affect appropriate.     Data Reviewed: I have personally reviewed following labs and imaging studies  CBC: Recent Labs  Lab 06/02/21 1627 06/03/21 0509  WBC 12.3* 13.0*  NEUTROABS 8.2* 9.4*  HGB 11.0* 10.2*  HCT 35.9* 32.7*  MCV 92.1 91.3  PLT 222 [id number]    Basic Metabolic Panel: Recent Labs  Lab 06/02/21 1627 06/03/21 0509 06/03/21 0911  NA 133* 135 135  K 5.6* 6.0* 6.0*  CL 106 109 109  CO2 21* 21* 19*  GLUCOSE 95 78 71  BUN 117* 120* 119*  CREATININE 8.54* 8.42* 8.49*  CALCIUM 7.6* 7.5* 7.4*  MG 3.2* 3.0*  --   PHOS  --   --  6.2*    GFR: Estimated Creatinine Clearance: 9.8 mL/min (A) (by C-G formula based on SCr of 8.49 mg/dL (H)).  Liver Function Tests: Recent Labs  Lab 06/02/21 1627 06/03/21 0509 06/03/21 0911  AST 9* 8*  --   ALT <5 <5  --   ALKPHOS 58 48  --   BILITOT 0.7 0.6  --   PROT 6.0* 5.6*  --   ALBUMIN 2.2* 2.1* 2.1*    CBG: No results for input(s): GLUCAP in the last 168 hours.   Recent Results (from the past 240 hour(s))  Resp Panel by RT-PCR (Flu A&B, Covid) Nasopharyngeal Swab     Status: None   Collection Time: 06/02/21  7:08 PM   Specimen: Nasopharyngeal Swab; Nasopharyngeal(NP) swabs in vial transport medium  Result Value Ref Range Status    SARS Coronavirus 2 by RT PCR NEGATIVE NEGATIVE Final    Comment: (NOTE) SARS-CoV-2 target nucleic acids are NOT DETECTED.  The SARS-CoV-2 RNA is generally detectable in upper respiratory specimens during the acute phase of infection. The lowest concentration of SARS-CoV-2 viral copies this assay can detect is 138 copies/mL. A negative result does not preclude SARS-Cov-2 infection and should not be used as the sole basis for treatment or other patient management decisions. A negative result may occur with  improper specimen collection/handling, submission of specimen other than nasopharyngeal swab, presence of viral mutation(s) within the areas targeted by this assay, and inadequate number of viral copies(<138 copies/mL). A negative result must be combined with clinical observations, patient history, and epidemiological information. The expected result is Negative.  Fact Sheet for Patients:  EntrepreneurPulse.com.au  Fact Sheet for Healthcare Providers:  IncredibleEmployment.be  This test is no t yet approved or cleared by the [place] FDA and  has been authorized for detection and/or diagnosis of SARS-CoV-2  by FDA under an Emergency Use Authorization (EUA). This EUA will remain  in effect (meaning this test can be used) for the duration of the COVID-19 declaration under Section 564(b)(1) of the Act, 21 U.S.C.section 360bbb-3(b)(1), unless the authorization is terminated  or revoked sooner.       Influenza A by PCR NEGATIVE NEGATIVE Final   Influenza B by PCR NEGATIVE NEGATIVE Final    Comment: (NOTE) The Xpert Xpress SARS-CoV-2/FLU/RSV plus assay is intended as an aid in the diagnosis of influenza from Nasopharyngeal swab specimens and should not be used as a sole basis for treatment. Nasal washings and aspirates are unacceptable for Xpert Xpress SARS-CoV-2/FLU/RSV testing.  Fact Sheet for  Patients: EntrepreneurPulse.com.au  Fact Sheet for Healthcare Providers: IncredibleEmployment.be  This test is not yet approved or cleared by the [place] FDA and has been authorized for detection and/or diagnosis of SARS-CoV-2 by FDA under an Emergency Use Authorization (EUA). This EUA will remain in effect (meaning this test can be used) for the duration of the COVID-19 declaration under Section 564(b)(1) of the Act, 21 U.S.C. section 360bbb-3(b)(1), unless the authorization is terminated or revoked.  Performed at [facility], [address] [place], [place] [address]      Radiology Studies: CT Head Wo Contrast  Result Date: 06/02/2021 CLINICAL DATA:  Mental status change, unknown cause. EXAM: CT HEAD WITHOUT CONTRAST TECHNIQUE: Contiguous axial images were obtained from the base of the skull through the vertex without intravenous contrast. COMPARISON:  CT September 11, 2016 FINDINGS: Brain: Similar age related global parenchymal volume loss with ex vacuo dilatation of ventricular system. Mild burden of subcortical and periventricular white matter hypodensities, while nonspecific favored to represent chronic ischemic microvascular white matter disease. No evidence of acute large vascular territory infarction, hemorrhage, hydrocephalus, extra-axial collection or mass lesion/mass effect. Vascular: No hyperdense vessel. Atherosclerotic calcifications of the internal carotid and vertebral arteries at the skull base. Skull: Normal. Negative for fracture or focal lesion. Sinuses/Orbits: Visualized portions of the paranasal sinuses and mastoid air cells are predominantly clear. Orbits are unremarkable. Other: None IMPRESSION: 1. No acute intracranial findings. 2. Age related global parenchymal volume loss and chronic ischemic microvascular white matter disease. Electronically Signed   By: Dahlia Bailiff MD   On: 06/02/2021 18:12   US Renal  Result Date:  06/03/2021 CLINICAL DATA:  70 year old male with history of acute kidney injury. EXAM: RENAL / URINARY TRACT ULTRASOUND COMPLETE COMPARISON:  No priors. FINDINGS: Right Kidney: Renal measurements: 9.1 x 4.6 x 5.0 cm = volume: 110 mL. Diffusely increased echogenicity. No mass or hydronephrosis visualized. Left Kidney: Renal measurements: 10.7 x 6.3 x 5.8 cm = volume: 206 mL. Diffusely increased echogenicity. No mass or hydronephrosis visualized. Bladder: Appears normal for degree of bladder distention. Other: None. IMPRESSION: 1. Atrophic echogenic kidneys bilaterally (right greater than left), indicative of medical renal disease. Electronically Signed   By: Vinnie Langton M.D.   On: 06/03/2021 10:27   DG Chest Port 1 View  Result Date: 06/02/2021 CLINICAL DATA:  Cough EXAM: PORTABLE CHEST 1 VIEW COMPARISON:  05/12/2018 FINDINGS: Low lung volumes with elevated right diaphragm. Cardiomegaly with central bronchovascular crowding. No consolidation, pleural effusion or pneumothorax. Aortic atherosclerosis. IMPRESSION: No active disease.  Cardiomegaly with low lung volumes. Electronically Signed   By: Donavan Foil M.D.   On: 06/02/2021 17:12   CT Renal Stone Study  Result Date: 06/02/2021 CLINICAL DATA:  Hematuria and renal failure EXAM: CT ABDOMEN AND PELVIS WITHOUT CONTRAST TECHNIQUE: Multidetector  CT imaging of the abdomen and pelvis was performed following the standard protocol without IV contrast. COMPARISON:  11/11/2019 FINDINGS: Lower chest: Mild atelectatic changes are noted in the bases bilaterally. No focal confluent infiltrate is seen. Coronary calcifications are seen as well as a small pericardial effusion stable from the prior study. Hepatobiliary: Liver is well visualized and within normal limits. Gallbladder is partially distended with multiple dependent gallstones similar to that seen on prior CT examination. One of these appears in the region of the gallbladder neck. No inflammatory changes are  seen. Pancreas: Unremarkable. No pancreatic ductal dilatation or surrounding inflammatory changes. Spleen: Normal in size without focal abnormality. Adrenals/Urinary Tract: Adrenal glands are within normal limits. Kidneys demonstrate tiny nonobstructing right renal calculi. Some scarring is noted in the left kidney new from the prior exam. No focal mass lesion or hydronephrosis is seen. The bladder is decompressed by Foley catheter. Stomach/Bowel: Colon shows no obstructive or inflammatory changes. The appendix is well visualized and within normal limits. Small bowel and stomach are within normal limits. Vascular/Lymphatic: Atherosclerotic calcifications of the abdominal aorta are noted without aneurysmal dilatation. No significant lymphadenopathy is noted. Reproductive: Prostate is unremarkable. Other: No abdominal wall hernia or abnormality. No abdominopelvic ascites. Musculoskeletal: Degenerative changes of lumbar spine are noted. IMPRESSION: Cholelithiasis. One of the stones is near the gallbladder neck although no complicating factors are seen. Nonobstructing right renal stones Aortic Atherosclerosis (ICD10-I70.0). Electronically Signed   By: Inez Catalina M.D.   On: 06/02/2021 20:03     Scheduled Meds:  amiodarone  200 mg Oral Daily   atorvastatin  10 mg Oral Daily   bisacodyl  10 mg Oral Daily   carbidopa-levodopa  1 tablet Oral BID   Chlorhexidine Gluconate Cloth  6 each Topical Daily   divalproex  375 mg Oral BID   heparin  5,000 Units Subcutaneous Q8H   levothyroxine  112 mcg Oral Q0600   metoprolol tartrate  50 mg Oral BID   OLANZapine  15 mg Oral QHS   sodium chloride flush  3 mL Intravenous Q12H   sodium zirconium cyclosilicate  10 g Oral BID   Continuous Infusions:   LOS: 1 day    Time spent: 35 minutes    Barton Dubois, MD [name]   To contact the attending provider between 7A-7P or the covering provider during after hours 7P-7A, please log into the web site  [email or web address] and access using universal [place] password for that web site. If you do not have the password, please call the hospital operator.  06/03/2021, 6:04 PM

## 2021-06-03 NOTE — Progress Notes (Signed)
Received report from [place], Therapist, sports. Assumed pt care at 0700. Pt resting comfortably in bed in no acute distress. A&O. VSS. Denies pain. Tolerating diet. LFA PIV infusing D5 1/2NS @ [id number] w/o complications. Call bell within reach. Will continue to monitor.

## 2021-06-03 NOTE — Plan of Care (Signed)
  Problem: Health Behavior/Discharge Planning: Goal: Ability to manage health-related needs will improve Outcome: Not Progressing   Problem: Nutrition: Goal: Adequate nutrition will be maintained Outcome: Not Progressing   Problem: Activity: Goal: Risk for activity intolerance will decrease Outcome: Not Progressing

## 2021-06-03 NOTE — Consult Note (Signed)
Reason for Consult:AKI/CKD stage III Referring Physician: Dyann Kief, MD  Todd Page is an 70 y.o. male with a PMH significant for atrial fibrillation on anticoagulation, type B aortic dissection, Parkinson's disease, PVD, HTN, HLD, schizoaffective disorder, cognitive communication deficit, CKD stage III, and wheelchair bound, longterm resident of [facility] who was sent to [facility] ED by his PCP due to abnormal labs.  Apparently at his visit with Dr. Ninfa Meeker, labs were notable for Scr of 7 (baseline 1.5).  He is on antibiotics for pneumonia as well as an UTI (on ertapenem IV via LUE PICC and po levoquin started on 05/31/21).   In the ED he was afebrile, VSS, labs repeated and notable for BUN 117, SC4 8.5, K 5.6, UA with + LE, neg nitrite, elevated WBC at 12.3.  CT scan renal stone protocol negative for hydronephrosis but did show nonobstructing right renal calculi.  It also showed new scarring in the left kidney.  Foley catheter in place.  He was admitted for IVF's and we were consulted to further evaluate his AKI/CKD stage III.    Unable to obtain a review of systems as patient is a poor historian and has communication deficits so HPI produced by review of his EMR.  The trend in Scr is seen below.   Trend in Creatinine: Creatinine, Ser  Date/Time Value Ref Range Status  06/03/2021 05:09 AM 8.42 (H) 0.61 - 1.24 mg/dL Final  06/02/2021 04:27 PM 8.54 (H) 0.61 - 1.24 mg/dL Final  03/22/2021 01:37 PM 1.90 (H) 0.61 - 1.24 mg/dL Final  11/11/2019 09:05 AM 1.70 (H) 0.61 - 1.24 mg/dL Final  08/16/2019 01:27 PM 1.98 (H) 0.61 - 1.24 mg/dL Final  05/15/2018 04:57 AM 1.15 0.61 - 1.24 mg/dL Final  05/14/2018 05:38 AM 1.07 0.61 - 1.24 mg/dL Final  05/13/2018 04:56 AM 1.12 0.61 - 1.24 mg/dL Final  05/12/2018 04:55 AM 1.33 (H) 0.61 - 1.24 mg/dL Final  02/06/2018 06:26 AM 0.79 0.61 - 1.24 mg/dL Final  02/05/2018 06:29 PM 0.97 0.61 - 1.24 mg/dL Final  01/31/2018 03:04 AM 0.86 0.61 - 1.24 mg/dL Final  01/28/2018  02:35 AM 0.80 0.61 - 1.24 mg/dL Final  01/27/2018 02:37 AM 1.00 0.61 - 1.24 mg/dL Final  01/26/2018 03:41 AM 1.13 0.61 - 1.24 mg/dL Final  01/25/2018 01:59 PM 0.90 0.61 - 1.24 mg/dL Final  01/25/2018 01:47 PM 0.87 0.61 - 1.24 mg/dL Final  04/14/2014 12:40 PM 1.28 0.50 - 1.35 mg/dL Final  03/20/2014 05:43 AM 1.60 (H) 0.50 - 1.35 mg/dL Final  03/19/2014 06:11 AM 1.48 (H) 0.50 - 1.35 mg/dL Final  03/18/2014 11:51 AM 1.40 (H) 0.50 - 1.35 mg/dL Final  09/25/2013 05:51 PM 1.14 0.50 - 1.35 mg/dL Final  08/17/2013 01:34 PM 1.32 0.50 - 1.35 mg/dL Final  06/09/2013 05:13 AM 1.28 0.50 - 1.35 mg/dL Final  06/08/2013 04:40 PM 1.27 0.50 - 1.35 mg/dL Final  06/06/2013 08:30 AM 1.12 0.50 - 1.35 mg/dL Final  05/27/2013 04:49 AM 1.41 (H) 0.50 - 1.35 mg/dL Final  05/26/2013 04:37 AM 1.39 (H) 0.50 - 1.35 mg/dL Final  05/25/2013 01:23 PM 1.39 (H) 0.50 - 1.35 mg/dL Final  05/25/2013 09:28 AM 1.40 (H) 0.50 - 1.35 mg/dL Final  05/21/2013 04:26 AM 1.66 (H) 0.50 - 1.35 mg/dL Final  05/20/2013 04:47 AM 1.42 (H) 0.50 - 1.35 mg/dL Final  05/19/2013 05:38 AM 1.32 0.50 - 1.35 mg/dL Final  05/18/2013 10:23 AM 1.25 0.50 - 1.35 mg/dL Final  02/20/2013 04:42 AM 1.47 (H) 0.50 - 1.35 mg/dL  Final  02/19/2013 04:39 AM 1.49 (H) 0.50 - 1.35 mg/dL Final  02/18/2013 04:51 AM 1.23 0.50 - 1.35 mg/dL Final  02/17/2013 09:45 AM 1.42 (H) 0.50 - 1.35 mg/dL Final  01/21/2013 04:57 AM 1.40 (H) 0.50 - 1.35 mg/dL Final  01/20/2013 04:58 AM 1.57 (H) 0.50 - 1.35 mg/dL Final  01/19/2013 04:22 AM 1.60 (H) 0.50 - 1.35 mg/dL Final  01/18/2013 04:27 AM 1.28 0.50 - 1.35 mg/dL Final  01/16/2013 07:58 AM 1.23 0.50 - 1.35 mg/dL Final  12/29/2012 04:59 AM 1.25 0.50 - 1.35 mg/dL Final  12/27/2012 05:44 AM 1.28 0.50 - 1.35 mg/dL Final  12/26/2012 05:05 AM 1.27 0.50 - 1.35 mg/dL Final  12/25/2012 02:13 AM 1.22 0.50 - 1.35 mg/dL Final  12/24/2012 10:09 AM 1.19 0.50 - 1.35 mg/dL Final  11/22/2012 05:07 PM 1.39 (H) 0.50 - 1.35 mg/dL Final   11/10/2012 09:23 PM 1.37 (H) 0.50 - 1.35 mg/dL Final  09/22/2012 05:08 AM 1.14 0.50 - 1.35 mg/dL Final  09/21/2012 04:12 AM 1.30 0.50 - 1.35 mg/dL Final    PMH:   Past Medical History:  Diagnosis Date   A-fib ([place])    Anemia    Atrial fibrillation ([place]) 06/2012   onset 06/2012; normal TSH; EF-45%   CHF (congestive heart failure) ([place])    Cholelithiasis    incidental finding on CT in 2013   Cognitive communication deficit    Dysphagia    Dysphagia    Feeding difficulties    Gout    Hyperlipidemia    Hypertension    minimal coronary atherosclerosis by CT   Hypokalemia    Liver disease    Noncompliance 09/21/2012   Osteoporosis    Overweight(278.02)    Parkinson's disease ([place])    Peripheral vascular disease ([place])    Renal disorder    Schizoaffective disorder ([place])    With depression   Systolic dysfunction    Thoracic aortic aneurysm ([facility])    Thrombophilia ([place])    Tinea cruris    Vascular dementia ([place])    Vitamin D deficiency     PSH:   Past Surgical History:  Procedure Laterality Date   COLONOSCOPY  2006   normal screening examination   prosthetic heart valve      Allergies: No Known Allergies  Medications:   Prior to Admission medications   Medication Sig Start Date End Date Taking? Authorizing Provider  acetaminophen (TYLENOL) 650 MG CR tablet Take 650 mg by mouth every 8 (eight) hours as needed for pain.    [provider]  alum & mag hydroxide-simeth (MAALOX/MYLANTA) 200-200-20 MG/5ML suspension Take 30 mLs by mouth every 2 (two) hours as needed for indigestion.     [provider]  amiodarone (PACERONE) 200 MG tablet Take 1 tablet (200 mg total) by mouth daily. 02/01/18   Barrett, Erin R, PA-C  amLODipine (NORVASC) 5 MG tablet Take 1 tablet (5 mg total) by mouth every evening. 02/06/18   Kathie Dike, MD  atorvastatin (LIPITOR) 10 MG tablet Take 10 mg by mouth daily.    [provider]  bisacodyl (DULCOLAX) 5 MG EC tablet Take  10 mg by mouth every morning.    [provider]  calcium carbonate (TUMS - DOSED IN MG ELEMENTAL CALCIUM) 500 MG chewable tablet Chew 1 tablet by mouth 3 (three) times daily.    [provider]  carbidopa-levodopa (SINEMET CR) 50-200 MG tablet Take 1 tablet by mouth 2 (two) times daily.    [provider]  Cholecalciferol (VITAMIN D3) 5000 units CAPS Take 1 capsule by mouth every morning.    [provider]  cloNIDine (CATAPRES) 0.2 MG tablet Take 1 tablet (0.2 mg total) by mouth 2 (two) times daily. 01/31/18   Barrett, Erin R, PA-C  divalproex (DEPAKOTE) 125 MG DR tablet Take 375 mg by mouth 2 (two) times daily.     [provider]  ferrous sulfate 324 MG TBEC Take by mouth.    [provider]  FLUoxetine (PROZAC) 20 MG capsule Take 20 mg by mouth every morning.     [provider]  guaiFENesin (MUCINEX) 600 MG 12 hr tablet Take 600 mg by mouth 2 (two) times daily.    [provider]  ipratropium-albuterol (DUONEB) 0.5-2.5 (3) MG/3ML SOLN Take 3 mLs by nebulization every 6 (six) hours as needed (for SOB; wheezing).    [provider]  KLOR-CON M20 20 MEQ tablet Take 20 mEq by mouth daily.  09/18/18   [provider]  levothyroxine (SYNTHROID) 25 MCG tablet Take 25 mcg by mouth daily. 09/25/19   [provider]  magnesium hydroxide (MILK OF MAGNESIA) 400 MG/5ML suspension Take 30 mLs by mouth every morning.    [provider]  metoprolol (LOPRESSOR) 50 MG tablet Take 50 mg by mouth 2 (two) times daily.    [provider]  OLANZapine (ZYPREXA) 15 MG tablet Take 15 mg by mouth at bedtime.    [provider]  ondansetron (ZOFRAN) 4 MG tablet Take 4 mg by mouth every 6 (six) hours as needed for nausea or vomiting.     [provider]  torsemide (DEMADEX) 20 MG tablet Take 20 mg by mouth daily.    [provider]    Inpatient medications:  bisacodyl  10 mg Oral  Daily   carbidopa-levodopa  1 tablet Oral BID   Chlorhexidine Gluconate Cloth  6 each Topical Daily   heparin  5,000 Units Subcutaneous Q8H   levothyroxine  112 mcg Oral Q0600   metoprolol tartrate  50 mg Oral BID   sodium chloride flush  3 mL Intravenous Q12H   sodium zirconium cyclosilicate  10 g Oral BID    Discontinued Meds:  There are no discontinued medications.  Social History:  reports that he has never smoked. He has never used smokeless tobacco. He reports that he does not drink alcohol and does not use drugs.  Family History:   Family History  Problem Relation Age of Onset   Hypertension Mother    Liver disease Father     Review of systems not obtained due to patient factors. Weight change:   Intake/Output Summary (Last 24 hours) at 06/03/2021 1311 Last data filed at 06/03/2021 0856 Gross per 24 hour  Intake 2231.72 ml  Output --  Net 2231.72 ml   BP (!) 146/88 (BP Location: Right Arm)   Pulse 96   Temp 97.6 F (36.4 C) (Oral)   Resp 19   Ht '5\' 11"'$  (1.803 m)   Wt 97.4 kg   SpO2 98%   BMI 29.95 kg/m  Vitals:   06/02/21 1730 06/02/21 2140 06/02/21 2200 06/03/21 0551  BP: 140/88 (!) 157/98 (!) 144/96 (!) 146/88  Pulse: 90 90 94 96  Resp: 17 20 (!) 22 19  Temp:  98.4 F (36.9 C) 97.8 F (36.6 C) 97.6 F (36.4 C)  TempSrc:  Oral Axillary Oral  SpO2: 100% 98% 98% 98%  Weight:   97.4 kg   Height:  $'5\' 11"'E$  (1.803 m)      General appearance: fatigued, no distress, and slowed mentation Head: Normocephalic, without obvious abnormality, atraumatic Resp: clear to auscultation bilaterally Cardio: no rub GI: soft, non-tender; bowel sounds normal; no masses,  no organomegaly Extremities: extremities normal, atraumatic, no cyanosis or edema  Labs: Basic Metabolic Panel: Recent Labs  Lab 06/02/21 1627 06/03/21 0509  NA 133* 135  K 5.6* 6.0*  CL 106 109  CO2 21* 21*  GLUCOSE 95 78  BUN 117* 120*  CREATININE 8.54* 8.42*  ALBUMIN 2.2* 2.1*  CALCIUM 7.6*  7.5*   Liver Function Tests: Recent Labs  Lab 06/02/21 1627 06/03/21 0509  AST 9* 8*  ALT <5 <5  ALKPHOS 58 48  BILITOT 0.7 0.6  PROT 6.0* 5.6*  ALBUMIN 2.2* 2.1*   No results for input(s): LIPASE, AMYLASE in the last 168 hours. Recent Labs  Lab 06/02/21 1627  AMMONIA 15   CBC: Recent Labs  Lab 06/02/21 1627 06/03/21 0509  WBC 12.3* 13.0*  NEUTROABS 8.2* 9.4*  HGB 11.0* 10.2*  HCT 35.9* 32.7*  MCV 92.1 91.3  PLT 222 201   PT/INR: '@LABRCNTIP'$ (inr:5) Cardiac Enzymes: ) Recent Labs  Lab 06/02/21 1912  CKTOTAL 19*   CBG: No results for input(s): GLUCAP in the last 168 hours.  Iron Studies: No results for input(s): IRON, TIBC, TRANSFERRIN, FERRITIN in the last 168 hours.  Xrays/Other Studies: CT Head Wo Contrast  Result Date: 06/02/2021 CLINICAL DATA:  Mental status change, unknown cause. EXAM: CT HEAD WITHOUT CONTRAST TECHNIQUE: Contiguous axial images were obtained from the base of the skull through the vertex without intravenous contrast. COMPARISON:  CT September 11, 2016 FINDINGS: Brain: Similar age related global parenchymal volume loss with ex vacuo dilatation of ventricular system. Mild burden of subcortical and periventricular white matter hypodensities, while nonspecific favored to represent chronic ischemic microvascular white matter disease. No evidence of acute large vascular territory infarction, hemorrhage, hydrocephalus, extra-axial collection or mass lesion/mass effect. Vascular: No hyperdense vessel. Atherosclerotic calcifications of the internal carotid and vertebral arteries at the skull base. Skull: Normal. Negative for fracture or focal lesion. Sinuses/Orbits: Visualized portions of the paranasal sinuses and mastoid air cells are predominantly clear. Orbits are unremarkable. Other: None IMPRESSION: 1. No acute intracranial findings. 2. Age related global parenchymal volume loss and chronic ischemic microvascular white matter disease. Electronically  Signed   By: Dahlia Bailiff MD   On: 06/02/2021 18:12   US Renal  Result Date: 06/03/2021 CLINICAL DATA:  70 year old male with history of acute kidney injury. EXAM: RENAL / URINARY TRACT ULTRASOUND COMPLETE COMPARISON:  No priors. FINDINGS: Right Kidney: Renal measurements: 9.1 x 4.6 x 5.0 cm = volume: 110 mL. Diffusely increased echogenicity. No mass or hydronephrosis visualized. Left Kidney: Renal measurements: 10.7 x 6.3 x 5.8 cm = volume: 206 mL. Diffusely increased echogenicity. No mass or hydronephrosis visualized. Bladder: Appears normal for degree of bladder distention. Other: None. IMPRESSION: 1. Atrophic echogenic kidneys bilaterally (right greater than left), indicative of medical renal disease. Electronically Signed   By: Vinnie Langton M.D.   On: 06/03/2021 10:27   DG Chest Port 1 View  Result Date: 06/02/2021 CLINICAL DATA:  Cough EXAM: PORTABLE CHEST 1 VIEW COMPARISON:  05/12/2018 FINDINGS: Low lung volumes with elevated right diaphragm. Cardiomegaly with central bronchovascular crowding. No consolidation, pleural effusion or pneumothorax. Aortic atherosclerosis. IMPRESSION: No active disease.  Cardiomegaly with low lung volumes. Electronically Signed   By: Donavan Foil M.D.   On: 06/02/2021 17:12  CT Renal Stone Study  Result Date: 06/02/2021 CLINICAL DATA:  Hematuria and renal failure EXAM: CT ABDOMEN AND PELVIS WITHOUT CONTRAST TECHNIQUE: Multidetector CT imaging of the abdomen and pelvis was performed following the standard protocol without IV contrast. COMPARISON:  11/11/2019 FINDINGS: Lower chest: Mild atelectatic changes are noted in the bases bilaterally. No focal confluent infiltrate is seen. Coronary calcifications are seen as well as a small pericardial effusion stable from the prior study. Hepatobiliary: Liver is well visualized and within normal limits. Gallbladder is partially distended with multiple dependent gallstones similar to that seen on prior CT examination. One of  these appears in the region of the gallbladder neck. No inflammatory changes are seen. Pancreas: Unremarkable. No pancreatic ductal dilatation or surrounding inflammatory changes. Spleen: Normal in size without focal abnormality. Adrenals/Urinary Tract: Adrenal glands are within normal limits. Kidneys demonstrate tiny nonobstructing right renal calculi. Some scarring is noted in the left kidney new from the prior exam. No focal mass lesion or hydronephrosis is seen. The bladder is decompressed by Foley catheter. Stomach/Bowel: Colon shows no obstructive or inflammatory changes. The appendix is well visualized and within normal limits. Small bowel and stomach are within normal limits. Vascular/Lymphatic: Atherosclerotic calcifications of the abdominal aorta are noted without aneurysmal dilatation. No significant lymphadenopathy is noted. Reproductive: Prostate is unremarkable. Other: No abdominal wall hernia or abnormality. No abdominopelvic ascites. Musculoskeletal: Degenerative changes of lumbar spine are noted. IMPRESSION: Cholelithiasis. One of the stones is near the gallbladder neck although no complicating factors are seen. Nonobstructing right renal stones Aortic Atherosclerosis (ICD10-I70.0). Electronically Signed   By: Inez Catalina M.D.   On: 06/02/2021 20:03     Assessment/Plan:  AKI/CKD stage III - unclear etiology.  If he was only started on IV abx 2 days prior to admission, not enough time to develop AIN (needs generally 7-10 days of antibiotics before developing AIN).  He was given CT with IV contrast on 03/22/21 and Scr was 1.9 at that time but never rechecked.  He has marked azotemia and CT scan without obstruction.  CK normal.  Agree with IVF's and follow UOP and BUN/Cr.  Significant output in foley bag, however nothing is documented in I's/O's.  Will recheck renal panel and start acute GN workup.  Will also send SPEP/UPEP given low anion gap.  Pt is not a suitable candidate for longterm dialysis  given his poor functional, cognitive, and nutritional status.  Recommend palliative care consult to help set goals/limits of care.   Hyperkalemia - due to #1- will treat with lokelma and IV bicarb. Parkinson's disease - wheelchair bound. Schizoaffective disorder - no documentation of lithium use. Atrial fibrillation - per primary Chronic diastolic CHF - torsemide on hold PNA/UTI - no evidence of PNA on CXR.   Broadus John A Fred Hammes 06/03/2021, 1:11 PM

## 2021-06-04 LAB — CBC
HCT: 34.2 % — ABNORMAL LOW (ref 39.0–52.0)
Hemoglobin: 10.5 g/dL — ABNORMAL LOW (ref 13.0–17.0)
MCH: 28.5 pg (ref 26.0–34.0)
MCHC: 30.7 g/dL (ref 30.0–36.0)
MCV: 92.9 fL (ref 80.0–100.0)
Platelets: 207 10*3/uL (ref 150–400)
RBC: 3.68 MIL/uL — ABNORMAL LOW (ref 4.22–5.81)
RDW: 15.8 % — ABNORMAL HIGH (ref 11.5–15.5)
WBC: 14.5 10*3/uL — ABNORMAL HIGH (ref 4.0–10.5)
nRBC: 0 % (ref 0.0–0.2)

## 2021-06-04 LAB — ANTISTREPTOLYSIN O TITER: ASO: 1698 IU/mL — ABNORMAL HIGH (ref 0.0–200.0)

## 2021-06-04 LAB — URINE CULTURE: Culture: NO GROWTH

## 2021-06-04 LAB — RENAL FUNCTION PANEL
Albumin: 2.3 g/dL — ABNORMAL LOW (ref 3.5–5.0)
Anion gap: 10 (ref 5–15)
BUN: 110 mg/dL — ABNORMAL HIGH (ref 8–23)
CO2: 20 mmol/L — ABNORMAL LOW (ref 22–32)
Calcium: 8 mg/dL — ABNORMAL LOW (ref 8.9–10.3)
Chloride: 106 mmol/L (ref 98–111)
Creatinine, Ser: 8.37 mg/dL — ABNORMAL HIGH (ref 0.61–1.24)
GFR, Estimated: 6 mL/min — ABNORMAL LOW (ref >60)
Glucose, Bld: 72 mg/dL (ref 70–99)
Phosphorus: 6 mg/dL — ABNORMAL HIGH (ref 2.5–4.6)
Potassium: 5.2 mmol/L — ABNORMAL HIGH (ref 3.5–5.1)
Sodium: 136 mmol/L (ref 135–145)

## 2021-06-04 LAB — C4 COMPLEMENT: Complement C4, Body Fluid: 20 mg/dL (ref 12–38)

## 2021-06-04 LAB — C3 COMPLEMENT: C3 Complement: 93 mg/dL (ref 82–167)

## 2021-06-04 MED ORDER — SODIUM CHLORIDE 0.9 % IV SOLN: INTRAVENOUS | Status: AC

## 2021-06-04 MED ORDER — HYDRALAZINE HCL 20 MG/ML IJ SOLN
10.0000 mg | Freq: Three times a day (TID) | INTRAMUSCULAR | Status: DC | PRN
  Administered 2021-06-05: 10 mg via INTRAVENOUS
  Filled 2021-06-04 (×2): qty 1

## 2021-06-04 NOTE — Progress Notes (Signed)
Patient ID: Todd Page, male   DOB: 20-Feb-1951, 70 y.o.   MRN: [id number] S:Has had marked increase in UOP over the past 24 hours and is net negative. O:BP (!) 178/107   Pulse 91   Temp 98.9 F (37.2 C) (Oral)   Resp 16   Ht '5\' 11"'$  (1.803 m)   Wt 97 kg   SpO2 100%   BMI 29.83 kg/m   Intake/Output Summary (Last 24 hours) at 06/04/2021 1150 Last data filed at 06/04/2021 0557 Gross per 24 hour  Intake 1155 ml  Output 2300 ml  Net -1145 ml   Intake/Output: I/O last 3 completed shifts: In: 3386.7 [P.O.:720; I.V.:1066.7; Other:600; IV Piggyback:1000] Out: 2300 [Urine:2300]  Intake/Output this shift:  No intake/output data recorded. Weight change: 6 kg Gen: NAD, masked facies CVS:RRR Resp: CTA Abd:+BS,soft,NT/ND Ext: trace to 1+ gravity dependent edema  Recent Labs  Lab 06/02/21 1627 06/03/21 0509 06/03/21 0911 06/04/21 0628  NA 133* 135 135 136  K 5.6* 6.0* 6.0* 5.2*  CL 106 109 109 106  CO2 21* 21* 19* 20*  GLUCOSE 95 78 71 72  BUN 117* 120* 119* 110*  CREATININE 8.54* 8.42* 8.49* 8.37*  ALBUMIN 2.2* 2.1* 2.1* 2.3*  CALCIUM 7.6* 7.5* 7.4* 8.0*  PHOS  --   --  6.2* 6.0*  AST 9* 8*  --   --   ALT <5 <5  --   --    Liver Function Tests: Recent Labs  Lab 06/02/21 1627 06/03/21 0509 06/03/21 0911 06/04/21 0628  AST 9* 8*  --   --   ALT <5 <5  --   --   ALKPHOS 58 48  --   --   BILITOT 0.7 0.6  --   --   PROT 6.0* 5.6*  --   --   ALBUMIN 2.2* 2.1* 2.1* 2.3*   No results for input(s): LIPASE, AMYLASE in the last 168 hours. Recent Labs  Lab 06/02/21 1627  AMMONIA 15   CBC: Recent Labs  Lab 06/02/21 1627 06/03/21 0509 06/04/21 0628  WBC 12.3* 13.0* 14.5*  NEUTROABS 8.2* 9.4*  --   HGB 11.0* 10.2* 10.5*  HCT 35.9* 32.7* 34.2*  MCV 92.1 91.3 92.9  PLT 222 201 207   Cardiac Enzymes: Recent Labs  Lab 06/02/21 1912  CKTOTAL 19*   CBG: No results for input(s): GLUCAP in the last 168 hours.  Iron Studies: No results for input(s): IRON, TIBC,  TRANSFERRIN, FERRITIN in the last 72 hours. Studies/Results: CT Head Wo Contrast  Result Date: 06/02/2021 CLINICAL DATA:  Mental status change, unknown cause. EXAM: CT HEAD WITHOUT CONTRAST TECHNIQUE: Contiguous axial images were obtained from the base of the skull through the vertex without intravenous contrast. COMPARISON:  CT September 11, 2016 FINDINGS: Brain: Similar age related global parenchymal volume loss with ex vacuo dilatation of ventricular system. Mild burden of subcortical and periventricular white matter hypodensities, while nonspecific favored to represent chronic ischemic microvascular white matter disease. No evidence of acute large vascular territory infarction, hemorrhage, hydrocephalus, extra-axial collection or mass lesion/mass effect. Vascular: No hyperdense vessel. Atherosclerotic calcifications of the internal carotid and vertebral arteries at the skull base. Skull: Normal. Negative for fracture or focal lesion. Sinuses/Orbits: Visualized portions of the paranasal sinuses and mastoid air cells are predominantly clear. Orbits are unremarkable. Other: None IMPRESSION: 1. No acute intracranial findings. 2. Age related global parenchymal volume loss and chronic ischemic microvascular white matter disease. Electronically Signed   By: Dahlia Bailiff MD  On: 06/02/2021 18:12   US Renal  Result Date: 06/03/2021 CLINICAL DATA:  70 year old male with history of acute kidney injury. EXAM: RENAL / URINARY TRACT ULTRASOUND COMPLETE COMPARISON:  No priors. FINDINGS: Right Kidney: Renal measurements: 9.1 x 4.6 x 5.0 cm = volume: 110 mL. Diffusely increased echogenicity. No mass or hydronephrosis visualized. Left Kidney: Renal measurements: 10.7 x 6.3 x 5.8 cm = volume: 206 mL. Diffusely increased echogenicity. No mass or hydronephrosis visualized. Bladder: Appears normal for degree of bladder distention. Other: None. IMPRESSION: 1. Atrophic echogenic kidneys bilaterally (right greater than left),  indicative of medical renal disease. Electronically Signed   By: Vinnie Langton M.D.   On: 06/03/2021 10:27   DG Chest Port 1 View  Result Date: 06/02/2021 CLINICAL DATA:  Cough EXAM: PORTABLE CHEST 1 VIEW COMPARISON:  05/12/2018 FINDINGS: Low lung volumes with elevated right diaphragm. Cardiomegaly with central bronchovascular crowding. No consolidation, pleural effusion or pneumothorax. Aortic atherosclerosis. IMPRESSION: No active disease.  Cardiomegaly with low lung volumes. Electronically Signed   By: Donavan Foil M.D.   On: 06/02/2021 17:12   CT Renal Stone Study  Result Date: 06/02/2021 CLINICAL DATA:  Hematuria and renal failure EXAM: CT ABDOMEN AND PELVIS WITHOUT CONTRAST TECHNIQUE: Multidetector CT imaging of the abdomen and pelvis was performed following the standard protocol without IV contrast. COMPARISON:  11/11/2019 FINDINGS: Lower chest: Mild atelectatic changes are noted in the bases bilaterally. No focal confluent infiltrate is seen. Coronary calcifications are seen as well as a small pericardial effusion stable from the prior study. Hepatobiliary: Liver is well visualized and within normal limits. Gallbladder is partially distended with multiple dependent gallstones similar to that seen on prior CT examination. One of these appears in the region of the gallbladder neck. No inflammatory changes are seen. Pancreas: Unremarkable. No pancreatic ductal dilatation or surrounding inflammatory changes. Spleen: Normal in size without focal abnormality. Adrenals/Urinary Tract: Adrenal glands are within normal limits. Kidneys demonstrate tiny nonobstructing right renal calculi. Some scarring is noted in the left kidney new from the prior exam. No focal mass lesion or hydronephrosis is seen. The bladder is decompressed by Foley catheter. Stomach/Bowel: Colon shows no obstructive or inflammatory changes. The appendix is well visualized and within normal limits. Small bowel and stomach are within  normal limits. Vascular/Lymphatic: Atherosclerotic calcifications of the abdominal aorta are noted without aneurysmal dilatation. No significant lymphadenopathy is noted. Reproductive: Prostate is unremarkable. Other: No abdominal wall hernia or abnormality. No abdominopelvic ascites. Musculoskeletal: Degenerative changes of lumbar spine are noted. IMPRESSION: Cholelithiasis. One of the stones is near the gallbladder neck although no complicating factors are seen. Nonobstructing right renal stones Aortic Atherosclerosis (ICD10-I70.0). Electronically Signed   By: Inez Catalina M.D.   On: 06/02/2021 20:03    amiodarone  200 mg Oral QHS   atorvastatin  10 mg Oral QHS   bisacodyl  10 mg Oral Daily   carbidopa-levodopa  1 tablet Oral BID   Chlorhexidine Gluconate Cloth  6 each Topical Daily   divalproex  375 mg Oral BID   heparin  5,000 Units Subcutaneous Q8H   levothyroxine  112 mcg Oral Q0600   metoprolol tartrate  50 mg Oral BID   OLANZapine  15 mg Oral QHS   sodium chloride flush  3 mL Intravenous Q12H   sodium zirconium cyclosilicate  10 g Oral BID    BMET    Component Value Date/Time   NA 136 06/04/2021 0628   NA 145 11/28/2014 0455   K 5.2 (H)  06/04/2021 0628   K 3.6 11/28/2014 0455   CL 106 06/04/2021 0628   CL 117 (H) 11/28/2014 0455   CO2 20 (L) 06/04/2021 0628   CO2 26 11/28/2014 0455   GLUCOSE 72 06/04/2021 0628   GLUCOSE 81 11/28/2014 0455   BUN 110 (H) 06/04/2021 0628   BUN 17 11/28/2014 0455   CREATININE 8.37 (H) 06/04/2021 0628   CREATININE 1.17 11/28/2014 0455   CALCIUM 8.0 (L) 06/04/2021 0628   CALCIUM 7.9 (L) 11/28/2014 0455   GFRNONAA 6 (L) 06/04/2021 0628   GFRNONAA >60 11/28/2014 0455   GFRAA 39 (L) 08/16/2019 1327   GFRAA >60 11/28/2014 0455   CBC    Component Value Date/Time   WBC 14.5 (H) 06/04/2021 0628   RBC 3.68 (L) 06/04/2021 0628   HGB 10.5 (L) 06/04/2021 0628   HGB 9.0 (L) 11/30/2014 0912   HCT 34.2 (L) 06/04/2021 0628   HCT 27.2 (L) 11/29/2014  0438   PLT 207 06/04/2021 0628   PLT 139 (L) 11/29/2014 0438   MCV 92.9 06/04/2021 0628   MCV 92 11/29/2014 0438   MCH 28.5 06/04/2021 0628   MCHC 30.7 06/04/2021 0628   RDW 15.8 (H) 06/04/2021 0628   RDW 15.0 (H) 11/29/2014 0438   LYMPHSABS 2.5 06/03/2021 0509   LYMPHSABS 0.9 (L) 11/29/2014 0438   MONOABS 0.7 06/03/2021 0509   MONOABS 0.7 11/29/2014 0438   EOSABS 0.1 06/03/2021 0509   EOSABS 0.2 11/29/2014 0438   BASOSABS 0.0 06/03/2021 0509   BASOSABS 0.0 11/29/2014 0438    Assessment/Plan:  AKI/CKD stage III - unclear etiology.  If he was only started on IV abx 2 days prior to admission, not enough time to develop AIN (needs generally 7-10 days of antibiotics before developing AIN).  He was given CT with IV contrast on 03/22/21 and Scr was 1.9 at that time but never rechecked.  He has marked azotemia and CT scan without obstruction.  CK normal.   Continue with IVF's and follow UOP and BUN/Cr.   Significant urine output over the past 48 hours so hopefully will start to see some improvement of Scr BUN slowly improving. Acute GN workup pending, as well as SPEP/UPEP given low anion gap.   Pt is not a suitable candidate for longterm dialysis given his poor functional, cognitive, and nutritional status.  Recommend palliative care consult to help set goals/limits of care.   Hyperkalemia - due to #1- will treat with lokelma and IV bicarb. Slowly improving.  Parkinson's disease - wheelchair bound. Schizoaffective disorder - no documentation of lithium use. Atrial fibrillation - per primary Chronic diastolic CHF - torsemide on hold PNA/UTI - no evidence of PNA on CXR.  Donetta Potts, MD [employer] [phone]

## 2021-06-04 NOTE — Progress Notes (Signed)
PROGRESS NOTE    Todd Page  [id number] DOB: [birth year] DOA: 06/02/2021 PCP: Hilbert Corrigan, MD   Chief Complaint  Patient presents with   abnomal lab    Brief admission narrative:  As per H&P written by Dr. Bridgett Larsson on 06/02/2021 70 year old male with a history of Parkinson's disease, paroxysmal atrial fibrillation, CKD stage III with a baseline creatinine of 1.9, schizoaffective disorder, paranoid schizophrenia, vascular dementia, bipolar disorder, chronic diastolic heart failure who presents to the ER for acute renal failure.  According to the documentation sent from the nursing home, the patient was started on Invanz via midline on 05/31/2021 for diagnosis of pneumonia.  There is no chest x-ray report that was sent.  He also had a Foley catheter placed on 06/01/2021.  Labs were drawn today at the nursing home and the patient was noted to have a BUN of 115 with a creatinine of 7.5.  Potassium was elevated 6.0.  Bicarbonate was 20.  He was sent to the ER for further evaluation.   On repeat testing here, patient noted to have a potassium of 5.6, BUN of 117, creatinine of 8.5.   Urinalysis demonstrated large cassette esterase with negative nitrites.  Urine specific gravity 1.008.  White count elevated at 12.3.  Assessment & Plan: 1-hyperkalemia/acute on chronic renal failure (ARF) (HCC) -Stage IIIb at baseline -Unclear etiology at this moment; but with concern for nephrotoxic agent and use of contrast. -Nephrology service on board and assisting with work-up -Continue to closely follow renal function trend -Continue treatment of hyperkalemia using Lokelma and sodium bicarbonate. -Patient with Foley catheter in place (placed at skilled nursing facility prior to admission); CT not demonstrating hydronephrosis or obstruction.  Continue to demonstrate good amount of urine production. -Continue closely following the strict I's and O's. -Followed by renal service not a candidate for long-term  hemodialysis; palliative care consultation has been recommended.  2-Atrial fibrillation ([place]) -Rate controlled -Prior to admission no using anticoagulation -Continue the use of amiodarone and metoprolol.  3-Schizoaffective disorder (HCC) -Mood overall stable -Continue supportive care, consult reorientation and nightly olanzapine.  4-Parkinson's disease ([place]) -Continue Sinemet. -Patient wheelchair-bound at baseline.  5-Chronic diastolic CHF (congestive heart failure) (HCC) -Stable and compensated -Continue to follow daily weights and strict I's and O's -Holding diuretics in the setting of worsening renal function at this time. -Continue beta-blocker.  6-essential hypertension -Continue to closely follow vital sign -Blood pressure slightly up -Will use as needed hydralazine. -Continue beta-blocker. -Avoid hypotension.   DVT prophylaxis: Heparin Code Status: Full code. Family Communication: No family at bedside. Disposition:   Status is: Inpatient  Remains inpatient appropriate because:IV treatments appropriate due to intensity of illness or inability to take PO  Dispo: The patient is from: SNF              Anticipated d/c is to: SNF              Patient currently is not medically stable to d/c.   Difficult to place patient No    Consultants:  Nephrology service  Procedures:  See below for x-ray reports  Antimicrobials:  None   Subjective: No chest pain, no nausea, no vomiting, no shortness of breath.  Continues to demonstrate  Objective: Vitals:   06/03/21 2325 06/04/21 0500 06/04/21 0554 06/04/21 1410  BP: (!) 159/100  (!) 178/107 (!) 173/104  Pulse: 91  91 (!) 52  Resp: '18  16 18  '$ Temp: 98.7 F (37.1 C)  98.9 F (37.2 C)  98.8 F (37.1 C)  TempSrc: Oral  Oral Oral  SpO2: 99%  100% 99%  Weight:  97 kg    Height:        Intake/Output Summary (Last 24 hours) at 06/04/2021 1736 Last data filed at 06/04/2021 1300 Gross per 24 hour  Intake 1155 ml   Output 1550 ml  Net -395 ml   Filed Weights   06/02/21 1534 06/02/21 2200 06/04/21 0500  Weight: 91 kg 97.4 kg 97 kg    Examination: General exam: Alert, awake and cooperative with examination; no chest pain, no nausea, no vomiting.  Good saturation on room air.  Denies shortness of breath. Respiratory system: Clear to auscultation. Respiratory effort normal.  No using accessory muscles. Cardiovascular system:RRR.  No rubs, no gallops, no JVD. Gastrointestinal system: Abdomen is nondistended, soft and nontender. No organomegaly or masses felt. Normal bowel sounds heard. Central nervous system: No new focal neurological deficits. Extremities: No cyanosis or clubbing; no lower extremity edema appreciated. Skin: No petechiae. Psychiatry: Mood & affect appropriate.    Data Reviewed: I have personally reviewed following labs and imaging studies  CBC: Recent Labs  Lab 06/02/21 1627 06/03/21 0509 06/04/21 0628  WBC 12.3* 13.0* 14.5*  NEUTROABS 8.2* 9.4*  --   HGB 11.0* 10.2* 10.5*  HCT 35.9* 32.7* 34.2*  MCV 92.1 91.3 92.9  PLT 222 201 [id number]    Basic Metabolic Panel: Recent Labs  Lab 06/02/21 1627 06/03/21 0509 06/03/21 0911 06/04/21 0628  NA 133* 135 135 136  K 5.6* 6.0* 6.0* 5.2*  CL 106 109 109 106  CO2 21* 21* 19* 20*  GLUCOSE 95 78 71 72  BUN 117* 120* 119* 110*  CREATININE 8.54* 8.42* 8.49* 8.37*  CALCIUM 7.6* 7.5* 7.4* 8.0*  MG 3.2* 3.0*  --   --   PHOS  --   --  6.2* 6.0*    GFR: Estimated Creatinine Clearance: 9.9 mL/min (A) (by C-G formula based on SCr of 8.37 mg/dL (H)).  Liver Function Tests: Recent Labs  Lab 06/02/21 1627 06/03/21 0509 06/03/21 0911 06/04/21 0628  AST 9* 8*  --   --   ALT <5 <5  --   --   ALKPHOS 58 48  --   --   BILITOT 0.7 0.6  --   --   PROT 6.0* 5.6*  --   --   ALBUMIN 2.2* 2.1* 2.1* 2.3*    CBG: No results for input(s): GLUCAP in the last 168 hours.   Recent Results (from the past 240 hour(s))  Urine Culture      Status: None   Collection Time: 06/02/21  6:04 PM   Specimen: Urine, Clean Catch  Result Value Ref Range Status   Specimen Description   Final    URINE, CLEAN CATCH Performed at [facility], [address], [place], [place] [address]    Special Requests   Final    NONE Performed at [facility], [address], [place], [place] [address]    Culture   Final    NO GROWTH Performed at [facility], [place] [address], [place], [place] [address]    Report Status 06/04/2021 FINAL  Final  Resp Panel by RT-PCR (Flu A&B, Covid) Nasopharyngeal Swab     Status: None   Collection Time: 06/02/21  7:08 PM   Specimen: Nasopharyngeal Swab; Nasopharyngeal(NP) swabs in vial transport medium  Result Value Ref Range Status   SARS Coronavirus 2 by RT PCR NEGATIVE NEGATIVE Final  Comment: (NOTE) SARS-CoV-2 target nucleic acids are NOT DETECTED.  The SARS-CoV-2 RNA is generally detectable in upper respiratory specimens during the acute phase of infection. The lowest concentration of SARS-CoV-2 viral copies this assay can detect is 138 copies/mL. A negative result does not preclude SARS-Cov-2 infection and should not be used as the sole basis for treatment or other patient management decisions. A negative result may occur with  improper specimen collection/handling, submission of specimen other than nasopharyngeal swab, presence of viral mutation(s) within the areas targeted by this assay, and inadequate number of viral copies(<138 copies/mL). A negative result must be combined with clinical observations, patient history, and epidemiological information. The expected result is Negative.  Fact Sheet for Patients:  EntrepreneurPulse.com.au  Fact Sheet for Healthcare Providers:  IncredibleEmployment.be  This test is no t yet approved or cleared by the [place] FDA and  has been authorized for detection and/or diagnosis of SARS-CoV-2 by FDA under an  Emergency Use Authorization (EUA). This EUA will remain  in effect (meaning this test can be used) for the duration of the COVID-19 declaration under Section 564(b)(1) of the Act, 21 U.S.C.section 360bbb-3(b)(1), unless the authorization is terminated  or revoked sooner.       Influenza A by PCR NEGATIVE NEGATIVE Final   Influenza B by PCR NEGATIVE NEGATIVE Final    Comment: (NOTE) The Xpert Xpress SARS-CoV-2/FLU/RSV plus assay is intended as an aid in the diagnosis of influenza from Nasopharyngeal swab specimens and should not be used as a sole basis for treatment. Nasal washings and aspirates are unacceptable for Xpert Xpress SARS-CoV-2/FLU/RSV testing.  Fact Sheet for Patients: EntrepreneurPulse.com.au  Fact Sheet for Healthcare Providers: IncredibleEmployment.be  This test is not yet approved or cleared by the [place] FDA and has been authorized for detection and/or diagnosis of SARS-CoV-2 by FDA under an Emergency Use Authorization (EUA). This EUA will remain in effect (meaning this test can be used) for the duration of the COVID-19 declaration under Section 564(b)(1) of the Act, 21 U.S.C. section 360bbb-3(b)(1), unless the authorization is terminated or revoked.  Performed at [facility], [address], [place], [place] [address]      Radiology Studies: CT Head Wo Contrast  Result Date: 06/02/2021 CLINICAL DATA:  Mental status change, unknown cause. EXAM: CT HEAD WITHOUT CONTRAST TECHNIQUE: Contiguous axial images were obtained from the base of the skull through the vertex without intravenous contrast. COMPARISON:  CT September 11, 2016 FINDINGS: Brain: Similar age related global parenchymal volume loss with ex vacuo dilatation of ventricular system. Mild burden of subcortical and periventricular white matter hypodensities, while nonspecific favored to represent chronic ischemic microvascular white matter disease. No evidence of  acute large vascular territory infarction, hemorrhage, hydrocephalus, extra-axial collection or mass lesion/mass effect. Vascular: No hyperdense vessel. Atherosclerotic calcifications of the internal carotid and vertebral arteries at the skull base. Skull: Normal. Negative for fracture or focal lesion. Sinuses/Orbits: Visualized portions of the paranasal sinuses and mastoid air cells are predominantly clear. Orbits are unremarkable. Other: None IMPRESSION: 1. No acute intracranial findings. 2. Age related global parenchymal volume loss and chronic ischemic microvascular white matter disease. Electronically Signed   By: Dahlia Bailiff MD   On: 06/02/2021 18:12   US Renal  Result Date: 06/03/2021 CLINICAL DATA:  70 year old male with history of acute kidney injury. EXAM: RENAL / URINARY TRACT ULTRASOUND COMPLETE COMPARISON:  No priors. FINDINGS: Right Kidney: Renal measurements: 9.1 x 4.6 x 5.0 cm = volume: 110 mL. Diffusely increased echogenicity. No  mass or hydronephrosis visualized. Left Kidney: Renal measurements: 10.7 x 6.3 x 5.8 cm = volume: 206 mL. Diffusely increased echogenicity. No mass or hydronephrosis visualized. Bladder: Appears normal for degree of bladder distention. Other: None. IMPRESSION: 1. Atrophic echogenic kidneys bilaterally (right greater than left), indicative of medical renal disease. Electronically Signed   By: Vinnie Langton M.D.   On: 06/03/2021 10:27   CT Renal Stone Study  Result Date: 06/02/2021 CLINICAL DATA:  Hematuria and renal failure EXAM: CT ABDOMEN AND PELVIS WITHOUT CONTRAST TECHNIQUE: Multidetector CT imaging of the abdomen and pelvis was performed following the standard protocol without IV contrast. COMPARISON:  11/11/2019 FINDINGS: Lower chest: Mild atelectatic changes are noted in the bases bilaterally. No focal confluent infiltrate is seen. Coronary calcifications are seen as well as a small pericardial effusion stable from the prior study. Hepatobiliary: Liver is  well visualized and within normal limits. Gallbladder is partially distended with multiple dependent gallstones similar to that seen on prior CT examination. One of these appears in the region of the gallbladder neck. No inflammatory changes are seen. Pancreas: Unremarkable. No pancreatic ductal dilatation or surrounding inflammatory changes. Spleen: Normal in size without focal abnormality. Adrenals/Urinary Tract: Adrenal glands are within normal limits. Kidneys demonstrate tiny nonobstructing right renal calculi. Some scarring is noted in the left kidney new from the prior exam. No focal mass lesion or hydronephrosis is seen. The bladder is decompressed by Foley catheter. Stomach/Bowel: Colon shows no obstructive or inflammatory changes. The appendix is well visualized and within normal limits. Small bowel and stomach are within normal limits. Vascular/Lymphatic: Atherosclerotic calcifications of the abdominal aorta are noted without aneurysmal dilatation. No significant lymphadenopathy is noted. Reproductive: Prostate is unremarkable. Other: No abdominal wall hernia or abnormality. No abdominopelvic ascites. Musculoskeletal: Degenerative changes of lumbar spine are noted. IMPRESSION: Cholelithiasis. One of the stones is near the gallbladder neck although no complicating factors are seen. Nonobstructing right renal stones Aortic Atherosclerosis (ICD10-I70.0). Electronically Signed   By: Inez Catalina M.D.   On: 06/02/2021 20:03     Scheduled Meds:  amiodarone  200 mg Oral QHS   atorvastatin  10 mg Oral QHS   bisacodyl  10 mg Oral Daily   carbidopa-levodopa  1 tablet Oral BID   Chlorhexidine Gluconate Cloth  6 each Topical Daily   divalproex  375 mg Oral BID   heparin  5,000 Units Subcutaneous Q8H   levothyroxine  112 mcg Oral Q0600   metoprolol tartrate  50 mg Oral BID   OLANZapine  15 mg Oral QHS   sodium chloride flush  3 mL Intravenous Q12H   sodium zirconium cyclosilicate  10 g Oral BID    Continuous Infusions:  sodium chloride 75 mL/hr at 06/04/21 1614     LOS: 2 days    Time spent: 35 minutes    Barton Dubois, MD [name]   To contact the attending provider between 7A-7P or the covering provider during after hours 7P-7A, please log into the web site [email or web address] and access using universal [place] password for that web site. If you do not have the password, please call the hospital operator.  06/04/2021, 5:36 PM

## 2021-06-05 DIAGNOSIS — Z7189 Other specified counseling: Secondary | ICD-10-CM

## 2021-06-05 DIAGNOSIS — F0150 Vascular dementia without behavioral disturbance: Secondary | ICD-10-CM

## 2021-06-05 LAB — RENAL FUNCTION PANEL
Albumin: 2 g/dL — ABNORMAL LOW (ref 3.5–5.0)
Anion gap: 8 (ref 5–15)
BUN: 103 mg/dL — ABNORMAL HIGH (ref 8–23)
CO2: 21 mmol/L — ABNORMAL LOW (ref 22–32)
Calcium: 7.8 mg/dL — ABNORMAL LOW (ref 8.9–10.3)
Chloride: 108 mmol/L (ref 98–111)
Creatinine, Ser: 8.61 mg/dL — ABNORMAL HIGH (ref 0.61–1.24)
GFR, Estimated: 6 mL/min — ABNORMAL LOW (ref >60)
Glucose, Bld: 79 mg/dL (ref 70–99)
Phosphorus: 6.3 mg/dL — ABNORMAL HIGH (ref 2.5–4.6)
Potassium: 5 mmol/L (ref 3.5–5.1)
Sodium: 137 mmol/L (ref 135–145)

## 2021-06-05 LAB — ANCA TITERS
Atypical P-ANCA titer: <1:20 {titer}
C-ANCA: <1:20 {titer}
P-ANCA: <1:20 {titer}

## 2021-06-05 LAB — GLOMERULAR BASEMENT MEMBRANE ANTIBODIES: GBM Ab: <0.2 units (ref 0.0–0.9)

## 2021-06-05 LAB — ANTI-DNA ANTIBODY, DOUBLE-STRANDED: ds DNA Ab: <1 IU/mL (ref 0–9)

## 2021-06-05 LAB — KAPPA/LAMBDA LIGHT CHAINS
Kappa free light chain: 276.3 mg/L — ABNORMAL HIGH (ref 3.3–19.4)
Kappa, lambda light chain ratio: 1.61 (ref 0.26–1.65)
Lambda free light chains: 172.1 mg/L — ABNORMAL HIGH (ref 5.7–26.3)

## 2021-06-05 MED ORDER — SODIUM CHLORIDE 0.9 % IV SOLN: INTRAVENOUS | Status: AC

## 2021-06-05 NOTE — Progress Notes (Signed)
Patient ID: Todd Page, male   DOB: [birth year], 70 y.o.   MRN: [id number] S: No complaints. O:BP (!) 153/92   Pulse 83   Temp 97.9 F (36.6 C) (Oral)   Resp 18   Ht '5\' 11"'$  (1.803 m)   Wt 97 kg   SpO2 97%   BMI 29.83 kg/m   Intake/Output Summary (Last 24 hours) at 06/05/2021 0908 Last data filed at 06/05/2021 0900 Gross per 24 hour  Intake 795.6 ml  Output 1450 ml  Net -654.4 ml   Intake/Output: I/O last 3 completed shifts: In: 1470.6 [P.O.:600; I.V.:870.6] Out: 2400 [Urine:2400]  Intake/Output this shift:  Total I/O In: 240 [P.O.:240] Out: -  Weight change: 0 kg Gen: slowed mentation, nad CVS:RRR Resp: CTA Abd: +BS, soft, NT/ND Ext: trace presacral edema  Recent Labs  Lab 06/02/21 1627 06/03/21 0509 06/03/21 0911 06/04/21 0628 06/05/21 0151  NA 133* 135 135 136 137  K 5.6* 6.0* 6.0* 5.2* 5.0  CL 106 109 109 106 108  CO2 21* 21* 19* 20* 21*  GLUCOSE 95 78 71 72 79  BUN 117* 120* 119* 110* 103*  CREATININE 8.54* 8.42* 8.49* 8.37* 8.61*  ALBUMIN 2.2* 2.1* 2.1* 2.3* 2.0*  CALCIUM 7.6* 7.5* 7.4* 8.0* 7.8*  PHOS  --   --  6.2* 6.0* 6.3*  AST 9* 8*  --   --   --   ALT <5 <5  --   --   --    Liver Function Tests: Recent Labs  Lab 06/02/21 1627 06/03/21 0509 06/03/21 0911 06/04/21 0628 06/05/21 0151  AST 9* 8*  --   --   --   ALT <5 <5  --   --   --   ALKPHOS 58 48  --   --   --   BILITOT 0.7 0.6  --   --   --   PROT 6.0* 5.6*  --   --   --   ALBUMIN 2.2* 2.1* 2.1* 2.3* 2.0*   No results for input(s): LIPASE, AMYLASE in the last 168 hours. Recent Labs  Lab 06/02/21 1627  AMMONIA 15   CBC: Recent Labs  Lab 06/02/21 1627 06/03/21 0509 06/04/21 0628  WBC 12.3* 13.0* 14.5*  NEUTROABS 8.2* 9.4*  --   HGB 11.0* 10.2* 10.5*  HCT 35.9* 32.7* 34.2*  MCV 92.1 91.3 92.9  PLT 222 201 207   Cardiac Enzymes: Recent Labs  Lab 06/02/21 1912  CKTOTAL 19*   CBG: No results for input(s): GLUCAP in the last 168 hours.  Iron Studies: No results for  input(s): IRON, TIBC, TRANSFERRIN, FERRITIN in the last 72 hours. Studies/Results: US Renal  Result Date: 06/03/2021 CLINICAL DATA:  70 year old male with history of acute kidney injury. EXAM: RENAL / URINARY TRACT ULTRASOUND COMPLETE COMPARISON:  No priors. FINDINGS: Right Kidney: Renal measurements: 9.1 x 4.6 x 5.0 cm = volume: 110 mL. Diffusely increased echogenicity. No mass or hydronephrosis visualized. Left Kidney: Renal measurements: 10.7 x 6.3 x 5.8 cm = volume: 206 mL. Diffusely increased echogenicity. No mass or hydronephrosis visualized. Bladder: Appears normal for degree of bladder distention. Other: None. IMPRESSION: 1. Atrophic echogenic kidneys bilaterally (right greater than left), indicative of medical renal disease. Electronically Signed   By: Vinnie Langton M.D.   On: 06/03/2021 10:27    amiodarone  200 mg Oral QHS   atorvastatin  10 mg Oral QHS   bisacodyl  10 mg Oral Daily   carbidopa-levodopa  1 tablet Oral BID  Chlorhexidine Gluconate Cloth  6 each Topical Daily   divalproex  375 mg Oral BID   heparin  5,000 Units Subcutaneous Q8H   levothyroxine  112 mcg Oral Q0600   metoprolol tartrate  50 mg Oral BID   OLANZapine  15 mg Oral QHS   sodium chloride flush  3 mL Intravenous Q12H   sodium zirconium cyclosilicate  10 g Oral BID    BMET    Component Value Date/Time   NA 137 06/05/2021 0151   NA 145 11/28/2014 0455   K 5.0 06/05/2021 0151   K 3.6 11/28/2014 0455   CL 108 06/05/2021 0151   CL 117 (H) 11/28/2014 0455   CO2 21 (L) 06/05/2021 0151   CO2 26 11/28/2014 0455   GLUCOSE 79 06/05/2021 0151   GLUCOSE 81 11/28/2014 0455   BUN 103 (H) 06/05/2021 0151   BUN 17 11/28/2014 0455   CREATININE 8.61 (H) 06/05/2021 0151   CREATININE 1.17 11/28/2014 0455   CALCIUM 7.8 (L) 06/05/2021 0151   CALCIUM 7.9 (L) 11/28/2014 0455   GFRNONAA 6 (L) 06/05/2021 0151   GFRNONAA >60 11/28/2014 0455   GFRAA 39 (L) 08/16/2019 1327   GFRAA >60 11/28/2014 0455   CBC     Component Value Date/Time   WBC 14.5 (H) 06/04/2021 0628   RBC 3.68 (L) 06/04/2021 0628   HGB 10.5 (L) 06/04/2021 0628   HGB 9.0 (L) 11/30/2014 0912   HCT 34.2 (L) 06/04/2021 0628   HCT 27.2 (L) 11/29/2014 0438   PLT 207 06/04/2021 0628   PLT 139 (L) 11/29/2014 0438   MCV 92.9 06/04/2021 0628   MCV 92 11/29/2014 0438   MCH 28.5 06/04/2021 0628   MCHC 30.7 06/04/2021 0628   RDW 15.8 (H) 06/04/2021 0628   RDW 15.0 (H) 11/29/2014 0438   LYMPHSABS 2.5 06/03/2021 0509   LYMPHSABS 0.9 (L) 11/29/2014 0438   MONOABS 0.7 06/03/2021 0509   MONOABS 0.7 11/29/2014 0438   EOSABS 0.1 06/03/2021 0509   EOSABS 0.2 11/29/2014 0438   BASOSABS 0.0 06/03/2021 0509   BASOSABS 0.0 11/29/2014 0438    Assessment/Plan:  AKI/CKD stage III - unclear etiology.  If he was only started on IV abx 2 days prior to admission, not enough time to develop AIN (needs generally 7-10 days of antibiotics before developing AIN).  He was given CT with IV contrast on 03/22/21 and Scr was 1.9 at that time but never rechecked.  He has marked azotemia and CT scan without obstruction.  CK normal as well as C3 and C4.   Continue with IVF's and follow UOP and BUN/Cr.   Significant urine output over the past 48 hours so hopefully will start to see some improvement of Scr soon.  BUN down but Scr at plateau No urgent indication for dialysis at this time. BUN slowly improving. Acute GN workup pending, as well as SPEP/UPEP given low anion gap.   Pt is not a suitable candidate for longterm dialysis given his poor functional, cognitive, and nutritional status.  Recommend palliative care consult to help set goals/limits of care.   Hyperkalemia - due to #1- will treat with lokelma and IV bicarb. Slowly improving.  Parkinson's disease - wheelchair bound. Schizoaffective disorder - no documentation of lithium use. Atrial fibrillation - per primary Chronic diastolic CHF - torsemide on hold PNA/UTI - no evidence of PNA on CXR.   Donetta Potts, MD [employer] [phone]

## 2021-06-05 NOTE — Progress Notes (Signed)
PROGRESS NOTE    Todd Page  [id number] DOB: Jun 27, 1951 DOA: 06/02/2021 PCP: Hilbert Corrigan, MD   Chief Complaint  Patient presents with   abnomal lab    Brief admission narrative:  As per H&P written by Dr. Bridgett Larsson on 06/02/2021 70 year old male with Todd Page history of Parkinson's disease, paroxysmal atrial fibrillation, CKD stage III with Todd Page baseline creatinine of 1.9, schizoaffective disorder, paranoid schizophrenia, vascular dementia, bipolar disorder, chronic diastolic heart failure who presents to the ER for acute renal failure.  According to the documentation sent from the nursing home, the patient was started on Invanz via midline on 05/31/2021 for diagnosis of pneumonia.  There is no chest x-ray report that was sent.  He also had Todd Page Foley catheter placed on 06/01/2021.  Labs were drawn today at the nursing home and the patient was noted to have Todd Page BUN of 115 with Todd Page creatinine of 7.5.  Potassium was elevated 6.0.  Bicarbonate was 20.  He was sent to the ER for further evaluation.   On repeat testing here, patient noted to have Todd Page potassium of 5.6, BUN of 117, creatinine of 8.5.   Urinalysis demonstrated large cassette esterase with negative nitrites.  Urine specific gravity 1.008.  White count elevated at 12.3.  Assessment & Plan: 1-hyperkalemia/acute on chronic renal failure (ARF) (HCC) -Stage IIIb at baseline -Unclear etiology at this moment; but with concern for nephrotoxic agent and use of contrast. -Nephrology service on board and assisting with work-up; recommending to continue IVF's.  -Continue to closely follow renal function trend -Continue treatment of hyperkalemia using Lokelma and sodium bicarbonate; K 5.0 currently.. -Patient with Foley catheter in place (placed at skilled nursing facility prior to admission); CT not demonstrating hydronephrosis or obstruction.  Continue to demonstrate good amount of urine production. -Continue closely following the strict I's and  O's. -Followed by renal service not Todd Page candidate for long-term hemodialysis; palliative care consultation has been recommended and will follow results of [place] discussion.  2-Atrial fibrillation ([place]) -Rate controlled -Prior to admission no using anticoagulation prior to admission. -Continue the use of amiodarone and metoprolol.  3-Schizoaffective disorder (HCC) -Mood overall stable -Continue supportive care, consult reorientation and nightly olanzapine.  4-Parkinson's disease ([place]) -Continue Sinemet. -Patient wheelchair-bound at baseline.  5-Chronic diastolic CHF (congestive heart failure) (HCC) -Stable and compensated currently -Continue to follow daily weights and strict I's and O's -continue Holding diuretics in the setting of worsening renal function at this time. -Continue beta-blocker.  6-essential hypertension -Continue to closely follow vital sign -Blood pressure fluctuating and continue to be slightly elevated -Will continue the use of PRN hydralazine. -Continue beta-blocker. -Avoid hypotension.   DVT prophylaxis: Heparin Code Status: Full code. Family Communication: No family at bedside. Disposition:   Status is: Inpatient  Remains inpatient appropriate because:IV treatments appropriate due to intensity of illness or inability to take PO  Dispo: The patient is from: SNF              Anticipated d/c is to: SNF              Patient currently is not medically stable to d/c.   Difficult to place patient No    Consultants:  Nephrology service  Procedures:  See below for x-ray reports  Antimicrobials:  Todd Page   Subjective: Good urine output appreciated in Foley bag; no chest pain, no nausea, no vomiting.  Good oxygen saturation on room air.  Objective: Vitals:   06/05/21 [id number] 06/05/21 0902 06/05/21 1334 06/05/21 1347  BP: (!) 139/93 (!) 153/92 (!) 179/112 (!) 176/96  Pulse: 60 83 60 64  Resp: 18  16   Temp: 97.9 F (36.6 C)  98.7 F (37.1 C)    TempSrc: Oral  Oral   SpO2: 97%  100%   Weight:      Height:        Intake/Output Summary (Last 24 hours) at 06/05/2021 1619 Last data filed at 06/05/2021 0900 Gross per 24 hour  Intake 555.6 ml  Output 850 ml  Net -294.4 ml   Filed Weights   06/02/21 2200 06/04/21 0500 06/05/21 0500  Weight: 97.4 kg 97 kg 97 kg    Examination: General exam: Alert, awake and cooperative with examination; he is able to answer simple questions but in-depth conversation demonstrated poor insight and lack of capacity making; denies chest pain, no shortness of breath, no nausea or vomiting.  Good saturation on room air. Respiratory system: No using accessory muscles; no wheezing, no crackles, Cardiovascular system:RRR.  No rubs or gallops; no JVD. Gastrointestinal system: Abdomen is nondistended, soft and nontender. No organomegaly or masses felt. Normal bowel sounds heard. Central nervous system: Alert and oriented. No focal neurological deficits. Extremities: No cyanosis or clubbing. Skin: No petechiae. Psychiatry: Mood appears to be appropriate; poor insight appreciated.   Data Reviewed: I have personally reviewed following labs and imaging studies  CBC: Recent Labs  Lab 06/02/21 1627 06/03/21 0509 06/04/21 0628  WBC 12.3* 13.0* 14.5*  NEUTROABS 8.2* 9.4*  --   HGB 11.0* 10.2* 10.5*  HCT 35.9* 32.7* 34.2*  MCV 92.1 91.3 92.9  PLT 222 201 [id number]    Basic Metabolic Panel: Recent Labs  Lab 06/02/21 1627 06/03/21 0509 06/03/21 0911 06/04/21 0628 06/05/21 0151  NA 133* 135 135 136 137  K 5.6* 6.0* 6.0* 5.2* 5.0  CL 106 109 109 106 108  CO2 21* 21* 19* 20* 21*  GLUCOSE 95 78 71 72 79  BUN 117* 120* 119* 110* 103*  CREATININE 8.54* 8.42* 8.49* 8.37* 8.61*  CALCIUM 7.6* 7.5* 7.4* 8.0* 7.8*  MG 3.2* 3.0*  --   --   --   PHOS  --   --  6.2* 6.0* 6.3*    GFR: Estimated Creatinine Clearance: 9.6 mL/min (Todd Page) (by C-G formula based on SCr of 8.61 mg/dL (H)).  Liver Function Tests: Recent  Labs  Lab 06/02/21 1627 06/03/21 0509 06/03/21 0911 06/04/21 0628 06/05/21 0151  AST 9* 8*  --   --   --   ALT <5 <5  --   --   --   ALKPHOS 58 48  --   --   --   BILITOT 0.7 0.6  --   --   --   PROT 6.0* 5.6*  --   --   --   ALBUMIN 2.2* 2.1* 2.1* 2.3* 2.0*    CBG: No results for input(s): GLUCAP in the last 168 hours.   Recent Results (from the past 240 hour(s))  Urine Culture     Status: Todd Page   Collection Time: 06/02/21  6:04 PM   Specimen: Urine, Clean Catch  Result Value Ref Range Status   Specimen Description   Final    URINE, CLEAN CATCH Performed at [facility], [address] [place], [place] [address]    Special Requests   Final    Todd Page Performed at [facility], [address] [place], [place] [address]    Culture   Final    NO GROWTH  Performed at [facility], [address], [place], [place] [address]    Report Status 06/04/2021 FINAL  Final  Resp Panel by RT-PCR (Flu Todd Page&B, Covid) Nasopharyngeal Swab     Status: Todd Page   Collection Time: 06/02/21  7:08 PM   Specimen: Nasopharyngeal Swab; Nasopharyngeal(NP) swabs in vial transport medium  Result Value Ref Range Status   SARS Coronavirus 2 by RT PCR NEGATIVE NEGATIVE Final    Comment: (NOTE) SARS-CoV-2 target nucleic acids are NOT DETECTED.  The SARS-CoV-2 RNA is generally detectable in upper respiratory specimens during the acute phase of infection. The lowest concentration of SARS-CoV-2 viral copies this assay can detect is 138 copies/mL. Todd Page negative result does not preclude SARS-Cov-2 infection and should not be used as the sole basis for treatment or other patient management decisions. Todd Page negative result may occur with  improper specimen collection/handling, submission of specimen other than nasopharyngeal swab, presence of viral mutation(s) within the areas targeted by this assay, and inadequate number of viral copies(<138 copies/mL). Todd Page negative result must be combined with clinical  observations, patient history, and epidemiological information. The expected result is Negative.  Fact Sheet for Patients:  EntrepreneurPulse.com.au  Fact Sheet for Healthcare Providers:  IncredibleEmployment.be  This test is no t yet approved or cleared by the [place] FDA and  has been authorized for detection and/or diagnosis of SARS-CoV-2 by FDA under an Emergency Use Authorization (EUA). This EUA will remain  in effect (meaning this test can be used) for the duration of the COVID-19 declaration under Section 564(b)(1) of the Act, 21 U.S.C.section 360bbb-3(b)(1), unless the authorization is terminated  or revoked sooner.       Influenza Todd Page by PCR NEGATIVE NEGATIVE Final   Influenza B by PCR NEGATIVE NEGATIVE Final    Comment: (NOTE) The Xpert Xpress SARS-CoV-2/FLU/RSV plus assay is intended as an aid in the diagnosis of influenza from Nasopharyngeal swab specimens and should not be used as Todd Page sole basis for treatment. Nasal washings and aspirates are unacceptable for Xpert Xpress SARS-CoV-2/FLU/RSV testing.  Fact Sheet for Patients: EntrepreneurPulse.com.au  Fact Sheet for Healthcare Providers: IncredibleEmployment.be  This test is not yet approved or cleared by the [place] FDA and has been authorized for detection and/or diagnosis of SARS-CoV-2 by FDA under an Emergency Use Authorization (EUA). This EUA will remain in effect (meaning this test can be used) for the duration of the COVID-19 declaration under Section 564(b)(1) of the Act, 21 U.S.C. section 360bbb-3(b)(1), unless the authorization is terminated or revoked.  Performed at [facility], [address] [place], [place] [address]      Radiology Studies: No results found.   Scheduled Meds:  amiodarone  200 mg Oral QHS   atorvastatin  10 mg Oral QHS   bisacodyl  10 mg Oral Daily   carbidopa-levodopa  1 tablet Oral BID    Chlorhexidine Gluconate Cloth  6 each Topical Daily   divalproex  375 mg Oral BID   heparin  5,000 Units Subcutaneous Q8H   levothyroxine  112 mcg Oral Q0600   metoprolol tartrate  50 mg Oral BID   OLANZapine  15 mg Oral QHS   sodium chloride flush  3 mL Intravenous Q12H   sodium zirconium cyclosilicate  10 g Oral BID   Continuous Infusions:     LOS: 3 days    Time spent: 35 minutes    Barton Dubois, MD [name]   To contact the attending provider between 7A-7P or the covering  provider during after hours 7P-7A, please log into the web site [email or web address] and access using universal [place] password for that web site. If you do not have the password, please call the hospital operator.  06/05/2021, 4:19 PM

## 2021-06-05 NOTE — TOC Initial Note (Signed)
Transition of Care [facility]) - Initial/Assessment Note    Patient Details  Name: Todd Page MRN: [id number] Date of Birth: October 16, 1951  [facility] of [facility]) CM/SW Contact:    Natasha Bence, LCSW Phone Number: 06/05/2021, 3:38 PM  Clinical Narrative:                 Patient is a 70 year old male admitted for Acute renal failure. CSW received consult and observed patient's high readmission risk status. CSW conducted readmission risk assessment and initial assessment. Melissa reported that patient is ambulatory at baseline with mod assist. Patient requires assistance completing all ADL's. CSW inquired who is able to make decisions for patient. Melissa reported that patient is able to make all of his decisions independently. Melissa with [facility] agreed that patient's current disorientation may be acute due to his Schizoaffective disorder. TOC to follow.   Expected Discharge Plan: Long Term Nursing Home Barriers to Discharge: Continued Medical Work up   Patient Goals and CMS Choice Patient states their goals for this hospitalization and ongoing recovery are:: Return to LTC CMS Medicare.gov Compare Post Acute Care list provided to:: Patient Choice offered to / list presented to : Patient  Expected Discharge Plan and Services Expected Discharge Plan: [place]       Living arrangements for the past 2 months: [place]                                      Prior Living Arrangements/Services Living arrangements for the past 2 months: [place] Lives with:: Facility Resident Patient language and need for interpreter reviewed:: Yes Do you feel safe going back to the place where you live?: Yes      Need for Family Participation in Patient Care: Yes (Comment) Care giver support system in place?: Yes (comment)   Criminal Activity/Legal Involvement Pertinent to Current Situation/Hospitalization: No - Comment as needed  Activities of  Daily Living Home Assistive Devices/Equipment: Blood pressure cuff, CBG Meter ADL Screening (condition at time of admission) Patient's cognitive ability adequate to safely complete daily activities?: Yes Is the patient deaf or have difficulty hearing?: No Does the patient have difficulty seeing, even when wearing glasses/contacts?: No Does the patient have difficulty concentrating, remembering, or making decisions?: No Patient able to express need for assistance with ADLs?: Yes Does the patient have difficulty dressing or bathing?: No Independently performs ADLs?: No Communication: Independent Dressing (OT): Dependent Is this a change from baseline?: Pre-admission baseline Grooming: Dependent Is this a change from baseline?: Pre-admission baseline Feeding: Needs assistance Is this a change from baseline?: Pre-admission baseline Bathing: Dependent Is this a change from baseline?: Pre-admission baseline Toileting: Dependent Is this a change from baseline?: Pre-admission baseline In/Out Bed: Dependent Is this a change from baseline?: Pre-admission baseline Does the patient have difficulty walking or climbing stairs?: Yes Weakness of Legs: None Weakness of Arms/Hands: None  Permission Sought/Granted Permission sought to share information with : Other (comment) (Melissa with [facility]) Permission granted to share information with : Yes, Verbal Permission Granted     Permission granted to share info w AGENCY: [place]        Emotional Assessment       Orientation: : Oriented to Self, Oriented to Place Alcohol / Substance Use: Not Applicable Psych Involvement: No (comment)  Admission diagnosis:  Cough [id number] Metabolic encephalopathy [id number] Acute renal failure (ARF) (  [place]) [N17.9] AKI (acute kidney injury) ([place]) [N17.9] Patient Active Problem List   Diagnosis Date Noted   Acute renal failure (ARF) ([place]) 06/02/2021   Bedbound 06/02/2021   Sepsis secondary to UTI ([place])  05/12/2018   Hyperlipidemia 02/06/2018   Hypertensive urgency 02/05/2018   Parkinson's disease ([place]) 02/05/2018   Chronic diastolic CHF (congestive heart failure) ([place]) 02/05/2018   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[place]) 01/25/2018   Hyperglycemia 03/19/2014   CHF (congestive heart failure) ([place]) 03/19/2014   Acute exacerbation of congestive heart failure ([place]) 03/18/2014   Acute on chronic heart failure ([place]) 03/18/2014   Chest pain 06/08/2013   Tachypnea [id number]   Acute diastolic congestive heart failure ([place]) 05/25/2013   Chronic kidney disease, stage III (moderate) (HCC) - baseline SCr 1.9 05/25/2013   Acute gout 05/25/2013   Elevated troponin 05/18/2013   Acute respiratory failure ([place]) 02/20/2013   Lower extremity edema 02/17/2013   Acute on chronic systolic HF (heart failure) ([place]) 12/25/2012   Atrial fibrillation with rapid ventricular response ([place]) 12/24/2012   Malignant hypertension 12/24/2012   Dyspnea 12/24/2012   Bilateral leg pain 12/24/2012   Hypokalemia 12/24/2012   Noncompliance 09/21/2012   Hypertension 09/21/2012   Atrial fibrillation ([place]) 07/26/2012   Obesity 07/26/2012   Schizoaffective disorder ([place]) 07/26/2012   PCP:  Hilbert Corrigan, MD Pharmacy:   [facility], [place] - [address] [place] [address] Phone: [phone] Fax: [place], [place] - [address] [place] [address] [address] Phone: [phone] Fax: [phone]     Social Determinants of Health (SDOH) Interventions    Readmission Risk Interventions Readmission Risk Prevention Plan 06/05/2021  Transportation Screening Complete  Medication Review ([place]) Complete  PCP or Specialist appointment within 3-5 days of discharge Complete  HRI or Home Care Consult Complete  SW Recovery Care/Counseling Consult Complete  Palliative Care Screening Complete  Skilled  Nursing Facility Complete  Some recent data might be hidden

## 2021-06-05 NOTE — Consult Note (Signed)
Consultation Note Date: 06/05/2021   Patient Name: Todd Page  DOB: [birth year]  MRN: [id number]  Age / Sex: 70 y.o., male  PCP: Hilbert Corrigan, MD Referring Physician: Barton Dubois, MD  Reason for Consultation: Establishing goals of care  HPI/Patient Profile: 70 y.o. male  with past medical history of vascular dementia, Parkinson's disease, a-fib, CKD III, schizoaffective disorder, paranoid schizophrenia, bipolar disorder, chronic diastolic hear failure admitted on 06/02/2021 with acute renal failure- workup for cause in progress- noted that his ASO was significantly elevated indicating possible glomerulonephritis s/p strep a infection? Other causes being considered are Invanz injury, myeloma (spep/upep are pending). Urine output is increasing- BUN is down, Cr continues to be elevated. Palliative medicine consulted for [place] as patient would not be a candidate for dialysis.   Clinical Assessment and Goals of Care: Reviewed chart and met at bedside with patient.  He is oriented to person and place. But not situation. He cannot tell me why he is in the hospital. He told me he was admitted due to anemia and because his mouth does not work.  I attempted to orient him to his medical situation. He was not able to verbalize this information back to me. He also could not tell me about his medical history. He could only tell me that he lives at [facility] because "my family doesn't like me".  It is clear that Mr. Sharps does not have capacity to engage in goals of care discussion or to make decisions for himself.  I attempted to call his brother- Dmarcus Decicco who is listed as main contact. Received no answer and no opportunity to leave a voicemail.   Primary Decision Maker NEXT OF KIN    SUMMARY OF RECOMMENDATIONS -At this time cannot engage in [place] discussion with patient due to lack of capacity- capacity can wax and  wane and can vary based on the decision being made.  -Attempted to locate surrogate decision maker- patient has a brother- [employer] listed as main contact- no answer- it is noted previously in his chart that providers have not been able to reach him -Recommend due diligence by social work to locate family member for surrogate decision making- if one cannot be located he likely needs to be referred to DSS for guardianship    Code Status/Advance Care Planning: Full code  Discharge Planning: To Be Determined  Primary Diagnoses: Present on Admission:  Atrial fibrillation ([place])  Schizoaffective disorder ([place])  Chronic kidney disease, stage III (moderate) (HCC) - baseline SCr 1.9  Parkinson's disease (HCC)  Chronic diastolic CHF (congestive heart failure) ([place])   I have reviewed the medical record, interviewed the patient and family, and examined the patient. The following aspects are pertinent.  Past Medical History:  Diagnosis Date   A-fib ([place])    Anemia    Atrial fibrillation ([place]) 06/2012   onset 06/2012; normal TSH; EF-45%   CHF (congestive heart failure) ([place])    Cholelithiasis    incidental finding on CT in 2013  Cognitive communication deficit    Dysphagia    Dysphagia    Feeding difficulties    Gout    Hyperlipidemia    Hypertension    minimal coronary atherosclerosis by CT   Hypokalemia    Liver disease    Noncompliance 09/21/2012   Osteoporosis    Overweight(278.02)    Parkinson's disease ([place])    Peripheral vascular disease ([place])    Renal disorder    Schizoaffective disorder (HCC)    With depression   Systolic dysfunction    Thoracic aortic aneurysm ([place])    Thrombophilia ([place])    Tinea cruris    Vascular dementia ([place])    Vitamin D deficiency    Social History   Socioeconomic History   Marital status: Single    Spouse name: Not on file   Number of children: Not on file   Years of education: Not on file   Highest education level: Not on file   Occupational History   Not on file  Tobacco Use   Smoking status: Never   Smokeless tobacco: Never  Vaping Use   Vaping Use: Never used  Substance and Sexual Activity   Alcohol use: No    Comment: former    Drug use: No   Sexual activity: Not on file  Other Topics Concern   Not on file  Social History Narrative   Not on file   Social Determinants of Health   Financial Resource Strain: Not on file  Food Insecurity: Not on file  Transportation Needs: Not on file  Physical Activity: Not on file  Stress: Not on file  Social Connections: Not on file   Scheduled Meds:  amiodarone  200 mg Oral QHS   atorvastatin  10 mg Oral QHS   bisacodyl  10 mg Oral Daily   carbidopa-levodopa  1 tablet Oral BID   Chlorhexidine Gluconate Cloth  6 each Topical Daily   divalproex  375 mg Oral BID   heparin  5,000 Units Subcutaneous Q8H   levothyroxine  112 mcg Oral Q0600   metoprolol tartrate  50 mg Oral BID   OLANZapine  15 mg Oral QHS   sodium chloride flush  3 mL Intravenous Q12H   sodium zirconium cyclosilicate  10 g Oral BID   Continuous Infusions: PRN Meds:.acetaminophen **OR** acetaminophen, hydrALAZINE Medications Prior to Admission:  Prior to Admission medications   Medication Sig Start Date End Date Taking? Authorizing Provider  albuterol (VENTOLIN HFA) 108 (90 Base) MCG/ACT inhaler Inhale 1 puff into the lungs every 4 (four) hours as needed for shortness of breath. 05/27/21  Yes [provider]  amoxicillin-clavulanate (AUGMENTIN) 875-125 MG tablet Take 1 tablet by mouth 2 (two) times daily.   Yes [provider]  atorvastatin (LIPITOR) 10 MG tablet Take 10 mg by mouth daily.   Yes [provider]  Bisacodyl (DULCOLAX RE) Place 1 application rectally every evening.   Yes [provider]  calcium carbonate (TUMS - DOSED IN MG ELEMENTAL CALCIUM) 500 MG chewable tablet Chew 1 tablet by mouth 3 (three) times daily.   Yes [provider]   carbidopa-levodopa (SINEMET CR) 50-200 MG tablet Take 1 tablet by mouth 2 (two) times daily.   Yes [provider]  Cholecalciferol (VITAMIN D3) 5000 units CAPS Take 1 capsule by mouth every morning.   Yes [provider]  cloNIDine (CATAPRES) 0.2 MG tablet Take 1 tablet (0.2 mg total) by mouth 2 (two) times daily. 01/31/18  Yes Barrett, Junie Panning  R, PA-C  divalproex (DEPAKOTE SPRINKLE) 125 MG capsule Take 250 mg by mouth 2 (two) times daily. 04/21/21  Yes [provider]  divalproex (DEPAKOTE) 125 MG DR tablet Take 375 mg by mouth 2 (two) times daily.    Yes [provider]  ENULOSE 10 GM/15ML SOLN Take 20 g by mouth daily. 04/17/21  Yes [provider]  ertapenem (INVANZ) 1 g injection Inject 1 g into the muscle daily. 06/01/21  Yes [provider]  escitalopram (LEXAPRO) 10 MG tablet Take 10 mg by mouth daily. Give along with 10m=15mg 04/21/21  Yes [provider]  guaiFENesin (MUCINEX) 600 MG 12 hr tablet Take 600 mg by mouth 2 (two) times daily.   Yes [provider]  LACTULOSE PO Take 15 mLs by mouth daily.   Yes [provider]  levothyroxine (SYNTHROID) 112 MCG tablet Take 112 mcg by mouth daily. 04/21/21  Yes [provider]  linaclotide (LINZESS) 145 MCG CAPS capsule Take 145 mcg by mouth daily before breakfast.   Yes [provider]  linaclotide (LINZESS) 72 MCG capsule Take 72 mcg by mouth daily before breakfast.   Yes [provider]  metoprolol (LOPRESSOR) 50 MG tablet Take 50 mg by mouth 2 (two) times daily.   Yes [provider]  OLANZapine (ZYPREXA) 15 MG tablet Take 15 mg by mouth at bedtime.   Yes [provider]  potassium chloride (KLOR-CON) 20 MEQ packet Take 20 mEq by mouth daily. 05/19/21  Yes [provider]  PREDNISONE PO Take 40 mg by mouth daily.   Yes [provider]  senna (SENOKOT) 8.6 MG tablet Take 1 tablet by mouth daily.   Yes  [provider]  simethicone (MYLICON) 1263MG chewable tablet Chew 125 mg by mouth every 8 (eight) hours as needed for flatulence.   Yes [provider]  sodium chloride 0.9 % infusion Inject 150 mLs into the vein See admin instructions. Use 150 mls intraveniously x 24 hours for Dehydration for 2 days until finished. 1573mHR x 2 Liters Start Date 06/01/21   Yes [provider]  torsemide (DEMADEX) 20 MG tablet Take 20 mg by mouth daily.   Yes [provider]  ursodiol (ACTIGALL) 300 MG capsule Take 300 mg by mouth 2 (two) times daily. 04/21/21  Yes [provider]  acetaminophen (TYLENOL) 650 MG CR tablet Take 650 mg by mouth every 8 (eight) hours as needed for pain. Patient not taking: Reported on 06/03/2021    [provider]  alum & mag hydroxide-simeth (MAALOX/MYLANTA) 200-200-20 MG/5ML suspension Take 30 mLs by mouth every 2 (two) hours as needed for indigestion.  Patient not taking: Reported on 06/03/2021    [provider]  amiodarone (PACERONE) 200 MG tablet Take 1 tablet (200 mg total) by mouth daily. Patient not taking: Reported on 06/03/2021 02/01/18   Barrett, ErLodema HongPA-C  amiodarone (PACERONE) 200 MG tablet  07/14/18   [provider]  amLODipine (NORVASC) 5 MG tablet Take 1 tablet (5 mg total) by mouth every evening. Patient not taking: Reported on 06/03/2021 02/06/18   MeKathie DikeMD  bisacodyl (DULCOLAX) 5 MG EC tablet Take 10 mg by mouth every morning. Patient not taking: Reported on 06/03/2021    [provider]  cefTRIAXone (ROCEPHIN) 1 g injection SMARTSIG:1 Gram(s) IM Daily PRN Patient not taking: Reported on 06/03/2021 05/27/21   [provider]  ferrous sulfate 324 MG TBEC Take by mouth. Patient not taking: Reported  on 06/03/2021    [provider]  FLUoxetine (PROZAC) 20 MG capsule Take 20 mg by mouth every morning.  Patient not taking: Reported on 06/03/2021    [provider]   ipratropium-albuterol (DUONEB) 0.5-2.5 (3) MG/3ML SOLN Take 3 mLs by nebulization every 6 (six) hours as needed (for SOB; wheezing). Patient not taking: Reported on 06/03/2021    [provider]  KLOR-CON M20 20 MEQ tablet Take 20 mEq by mouth daily.  Patient not taking: Reported on 06/03/2021 09/18/18   [provider]  levothyroxine (SYNTHROID) 25 MCG tablet Take 25 mcg by mouth daily. Patient not taking: No sig reported 09/25/19   [provider]  lidocaine (XYLOCAINE) 1 % (with preservative) injection SMARTSIG:Rectally 06/01/21   [provider]  LORazepam (ATIVAN) 0.5 MG tablet Take 0.5 mg by mouth 2 (two) times daily as needed. Patient not taking: Reported on 06/03/2021 05/27/21   [provider]  magnesium hydroxide (MILK OF MAGNESIA) 400 MG/5ML suspension Take 30 mLs by mouth every morning. Patient not taking: Reported on 06/03/2021    [provider]  ondansetron (ZOFRAN) 4 MG tablet Take 4 mg by mouth every 6 (six) hours as needed for nausea or vomiting.  Patient not taking: Reported on 06/03/2021    [provider]   No Known Allergies Review of Systems  Unable to perform ROS: Dementia   Physical Exam Vitals and nursing note reviewed.  Cardiovascular:     Rate and Rhythm: Normal rate.  Pulmonary:     Effort: Pulmonary effort is normal.  Abdominal:     General: There is distension.  Skin:    Coloration: Skin is pale.    Vital Signs: BP (!) 153/92   Pulse 83   Temp 97.9 F (36.6 C) (Oral)   Resp 18   Ht 5' 11"  (1.803 m)   Wt 97 kg   SpO2 97%   BMI 29.83 kg/m  Pain Scale: 0-10   Pain Score: 0-No pain   SpO2: SpO2: 97 % O2 Device:SpO2: 97 % O2 Flow Rate: .O2 Flow Rate (L/min): 0 L/min  IO: Intake/output summary:  Intake/Output Summary (Last 24 hours) at 06/05/2021 1337 Last data filed at 06/05/2021 0900 Gross per 24 hour  Intake 555.6 ml  Output 850 ml  Net -294.4 ml    LBM: Last BM Date:  06/01/21 Baseline Weight: Weight: 91 kg Most recent weight: Weight: 97 kg     Palliative Assessment/Data: PPS: 30%     Thank you for this consult. Palliative medicine will continue to follow and assist as needed.   Time In: 1242 Time Out: 1349 Time Total: 67 minutes Greater than 50%  of this time was spent counseling and coordinating care related to the above assessment and plan.  Signed by: Mariana Kaufman, AGNP-C Palliative Medicine    Please contact Palliative Medicine Team phone at [phone] for questions and concerns.  For individual provider: See Shea Evans

## 2021-06-05 NOTE — Progress Notes (Addendum)
Patient's bp was 176/96. MD Dyann Kief made aware. Patient has hydralazine ordered PRN for systolic over 99991111 and/or diastolic over A999333. Order parameters followed.

## 2021-06-06 LAB — RENAL FUNCTION PANEL
Albumin: 2.1 g/dL — ABNORMAL LOW (ref 3.5–5.0)
Anion gap: 8 (ref 5–15)
BUN: 104 mg/dL — ABNORMAL HIGH (ref 8–23)
CO2: 18 mmol/L — ABNORMAL LOW (ref 22–32)
Calcium: 7.5 mg/dL — ABNORMAL LOW (ref 8.9–10.3)
Chloride: 109 mmol/L (ref 98–111)
Creatinine, Ser: 9.33 mg/dL — ABNORMAL HIGH (ref 0.61–1.24)
GFR, Estimated: 6 mL/min — ABNORMAL LOW (ref >60)
Glucose, Bld: 80 mg/dL (ref 70–99)
Phosphorus: 7 mg/dL — ABNORMAL HIGH (ref 2.5–4.6)
Potassium: 4.1 mmol/L (ref 3.5–5.1)
Sodium: 135 mmol/L (ref 135–145)

## 2021-06-06 LAB — COMPLEMENT, TOTAL: Compl, Total (CH50): >60 U/mL (ref >41)

## 2021-06-06 LAB — PROTEIN ELECTROPHORESIS, SERUM
A/G Ratio: 0.7 (ref 0.7–1.7)
Albumin ELP: 2.2 g/dL — ABNORMAL LOW (ref 2.9–4.4)
Alpha-1-Globulin: 0.2 g/dL (ref 0.0–0.4)
Alpha-2-Globulin: 0.6 g/dL (ref 0.4–1.0)
Beta Globulin: 0.6 g/dL — ABNORMAL LOW (ref 0.7–1.3)
Gamma Globulin: 1.5 g/dL (ref 0.4–1.8)
Globulin, Total: 3 g/dL (ref 2.2–3.9)
Total Protein ELP: 5.2 g/dL — ABNORMAL LOW (ref 6.0–8.5)

## 2021-06-06 MED ORDER — SODIUM CHLORIDE 0.9 % IV SOLN: INTRAVENOUS | Status: AC

## 2021-06-06 NOTE — TOC Progression Note (Signed)
Transition of Care [facility]) - Progression Note    Patient Details  Name: Todd Page MRN: [id number] Date of Birth: [birth year]  Transition of Care [facility]) CM/SW Contact  Natasha Bence, LCSW Phone Number: 06/06/2021, 4:52 PM  Clinical Narrative:    CSW placed DSS report with Melissa. Melissa reported that she will be able to assess patient tomorrow. TOC to follow.    Expected Discharge Plan: Long Term Nursing Home Barriers to Discharge: Continued Medical Work up  Expected Discharge Plan and Services Expected Discharge Plan: [place]       Living arrangements for the past 2 months: [place]                                       Social Determinants of Health (SDOH) Interventions    Readmission Risk Interventions Readmission Risk Prevention Plan 06/05/2021  Transportation Screening Complete  Medication Review [employer]) Complete  PCP or Specialist appointment within 3-5 days of discharge Complete  HRI or Home Care Consult Complete  SW Recovery Care/Counseling Consult Complete  Palliative Care Screening Complete  Skilled Nursing Facility Complete  Some recent data might be hidden

## 2021-06-06 NOTE — Progress Notes (Signed)
Patient ID: Todd Page, male   DOB: [birth year], 70 y.o.   MRN: [id number]  S:Unable to reach family members for goals of care.  Pt refusing labs this morning. O:BP (!) 146/78 (BP Location: Right Arm)   Pulse (!) 48   Temp 97.9 F (36.6 C) (Oral)   Resp 18   Ht '5\' 11"'$  (1.803 m)   Wt 103.7 kg   SpO2 97%   BMI 31.89 kg/m   Intake/Output Summary (Last 24 hours) at 06/06/2021 1057 Last data filed at 06/06/2021 0600 Gross per 24 hour  Intake 897.5 ml  Output 1751 ml  Net -853.5 ml   Intake/Output: I/O last 3 completed shifts: In: 1137.5 [P.O.:440; I.V.:697.5] Out: 2051 [Urine:2050; Stool:1]  Intake/Output this shift:  No intake/output data recorded. Weight change: 3.1 kg Gen: slowed mentation, no distress CVS:RRR, no rub Resp:decreased BS at bases Abd:+BS, soft, NT/ND [id number] presacral edema  Recent Labs  Lab 06/02/21 1627 06/03/21 0509 06/03/21 0911 06/04/21 0628 06/05/21 0151 06/06/21 0635  NA 133* 135 135 136 137 135  K 5.6* 6.0* 6.0* 5.2* 5.0 4.1  CL 106 109 109 106 108 109  CO2 21* 21* 19* 20* 21* 18*  GLUCOSE 95 78 71 72 79 80  BUN 117* 120* 119* 110* 103* 104*  CREATININE 8.54* 8.42* 8.49* 8.37* 8.61* 9.33*  ALBUMIN 2.2* 2.1* 2.1* 2.3* 2.0* 2.1*  CALCIUM 7.6* 7.5* 7.4* 8.0* 7.8* 7.5*  PHOS  --   --  6.2* 6.0* 6.3* 7.0*  AST 9* 8*  --   --   --   --   ALT <5 <5  --   --   --   --    Liver Function Tests: Recent Labs  Lab 06/02/21 1627 06/03/21 0509 06/03/21 0911 06/04/21 0628 06/05/21 0151 06/06/21 0635  AST 9* 8*  --   --   --   --   ALT <5 <5  --   --   --   --   ALKPHOS 58 48  --   --   --   --   BILITOT 0.7 0.6  --   --   --   --   PROT 6.0* 5.6*  --   --   --   --   ALBUMIN 2.2* 2.1*   < > 2.3* 2.0* 2.1*   < > = values in this interval not displayed.   No results for input(s): LIPASE, AMYLASE in the last 168 hours. Recent Labs  Lab 06/02/21 1627  AMMONIA 15   CBC: Recent Labs  Lab 06/02/21 1627 06/03/21 0509 06/04/21 0628  WBC 12.3*  13.0* 14.5*  NEUTROABS 8.2* 9.4*  --   HGB 11.0* 10.2* 10.5*  HCT 35.9* 32.7* 34.2*  MCV 92.1 91.3 92.9  PLT 222 201 207   Cardiac Enzymes: Recent Labs  Lab 06/02/21 1912  CKTOTAL 19*   CBG: No results for input(s): GLUCAP in the last 168 hours.  Iron Studies: No results for input(s): IRON, TIBC, TRANSFERRIN, FERRITIN in the last 72 hours. Studies/Results: No results found.  amiodarone  200 mg Oral QHS   atorvastatin  10 mg Oral QHS   bisacodyl  10 mg Oral Daily   carbidopa-levodopa  1 tablet Oral BID   Chlorhexidine Gluconate Cloth  6 each Topical Daily   divalproex  375 mg Oral BID   heparin  5,000 Units Subcutaneous Q8H   levothyroxine  112 mcg Oral Q0600   metoprolol tartrate  50 mg Oral BID  OLANZapine  15 mg Oral QHS   sodium chloride flush  3 mL Intravenous Q12H   sodium zirconium cyclosilicate  10 g Oral BID    BMET    Component Value Date/Time   NA 135 06/06/2021 0635   NA 145 11/28/2014 0455   K 4.1 06/06/2021 0635   K 3.6 11/28/2014 0455   CL 109 06/06/2021 0635   CL 117 (H) 11/28/2014 0455   CO2 18 (L) 06/06/2021 0635   CO2 26 11/28/2014 0455   GLUCOSE 80 06/06/2021 0635   GLUCOSE 81 11/28/2014 0455   BUN 104 (H) 06/06/2021 0635   BUN 17 11/28/2014 0455   CREATININE 9.33 (H) 06/06/2021 0635   CREATININE 1.17 11/28/2014 0455   CALCIUM 7.5 (L) 06/06/2021 0635   CALCIUM 7.9 (L) 11/28/2014 0455   GFRNONAA 6 (L) 06/06/2021 0635   GFRNONAA >60 11/28/2014 0455   GFRAA 39 (L) 08/16/2019 1327   GFRAA >60 11/28/2014 0455   CBC    Component Value Date/Time   WBC 14.5 (H) 06/04/2021 0628   RBC 3.68 (L) 06/04/2021 0628   HGB 10.5 (L) 06/04/2021 0628   HGB 9.0 (L) 11/30/2014 0912   HCT 34.2 (L) 06/04/2021 0628   HCT 27.2 (L) 11/29/2014 0438   PLT 207 06/04/2021 0628   PLT 139 (L) 11/29/2014 0438   MCV 92.9 06/04/2021 0628   MCV 92 11/29/2014 0438   MCH 28.5 06/04/2021 0628   MCHC 30.7 06/04/2021 0628   RDW 15.8 (H) 06/04/2021 0628   RDW 15.0 (H)  11/29/2014 0438   LYMPHSABS 2.5 06/03/2021 0509   LYMPHSABS 0.9 (L) 11/29/2014 0438   MONOABS 0.7 06/03/2021 0509   MONOABS 0.7 11/29/2014 0438   EOSABS 0.1 06/03/2021 0509   EOSABS 0.2 11/29/2014 0438   BASOSABS 0.0 06/03/2021 0509   BASOSABS 0.0 11/29/2014 0438      Assessment/Plan:  AKI/CKD stage III, non-oliguric - unclear etiology.  If he was only started on IV abx 2 days prior to admission, not enough time to develop AIN (needs generally 7-10 days of antibiotics before developing AIN).  He was given CT with IV contrast on 03/22/21 and Scr was 1.9 at that time but never rechecked.  He has marked azotemia and CT scan without obstruction.  CK normal as well as C3 and C4.   Continue with IVF's and follow UOP and BUN/Cr.   Significant urine output over the past 48 hours so hopefully will start to see some improvement of Scr soon.  BUN down but Scr at plateau No urgent indication for dialysis at this time. BUN slowly improving but Cr still climbing.  Acute GN workup pending, as well as SPEP/UPEP given low anion gap.   Pt is not a suitable candidate for longterm dialysis given his poor functional, cognitive, and nutritional status.  Recommend palliative care consult to help set goals/limits of care.   Hyperkalemia - due to #1- will treat with lokelma and IV bicarb. Slowly improving.  Parkinson's disease - wheelchair bound. Schizoaffective disorder - no documentation of lithium use. Atrial fibrillation - per primary Chronic diastolic CHF - torsemide on hold PNA/UTI - no evidence of PNA on CXR. Disposition - poor overall prognosis as above.   Donetta Potts, MD [employer] [phone]

## 2021-06-06 NOTE — Progress Notes (Signed)
Patient refused AM lab work. MD notified via secure chat.

## 2021-06-06 NOTE — Progress Notes (Addendum)
PROGRESS NOTE    Todd Page  [id number] DOB: December 30, 1950 DOA: 06/02/2021 PCP: Hilbert Corrigan, MD   Chief Complaint  Patient presents with   abnomal lab    Brief admission narrative:  As per H&P written by Dr. Bridgett Larsson on 06/02/2021 70 year old male with a history of Parkinson's disease, paroxysmal atrial fibrillation, CKD stage III with a baseline creatinine of 1.9, schizoaffective disorder, paranoid schizophrenia, vascular dementia, bipolar disorder, chronic diastolic heart failure who presents to the ER for acute renal failure.  According to the documentation sent from the nursing home, the patient was started on Invanz via midline on 05/31/2021 for diagnosis of pneumonia.  There is no chest x-ray report that was sent.  He also had a Foley catheter placed on 06/01/2021.  Labs were drawn today at the nursing home and the patient was noted to have a BUN of 115 with a creatinine of 7.5.  Potassium was elevated 6.0.  Bicarbonate was 20.  He was sent to the ER for further evaluation.   On repeat testing here, patient noted to have a potassium of 5.6, BUN of 117, creatinine of 8.5.   Urinalysis demonstrated large cassette esterase with negative nitrites.  Urine specific gravity 1.008.  White count elevated at 12.3.  Assessment & Plan: 1-hyperkalemia/acute on chronic renal failure (ARF) (HCC) -Stage IIIb at baseline -Unclear etiology at this moment; but with concern for nephrotoxic agent and use of contrast. -Nephrology service on board and assisting with work-up; recommending to continue IVF's.  -Continue to closely follow renal function trend -Continue treatment of hyperkalemia using Lokelma and sodium bicarbonate; K 4.1 currently.. -Patient with Foley catheter in place (placed at skilled nursing facility prior to admission); CT not demonstrating hydronephrosis or obstruction.  Continue to demonstrate good amount of urine production. -Continue closely following the strict I's and O's. -Cr  continue rising, BUN plateau, but above 100 range. -Following renal service rec's, given that patient is not a candidate for long-term hemodialysis; palliative care consultation has been recommended and will follow results of [place] discussion. Appreciate palliative care assistance and help, so far having difficulties contacting next of skin.  2-Atrial fibrillation ([place]) -Rate controlled -Prior to admission no using anticoagulation prior to admission. -Continue the use of amiodarone and metoprolol.  3-Schizoaffective disorder (HCC) -Mood overall stable -Continue supportive care, consult reorientation and nightly olanzapine.  4-Parkinson's disease ([place]) -Continue Sinemet. -Patient wheelchair-bound at baseline.  5-Chronic diastolic CHF (congestive heart failure) (HCC) -Stable and compensated currently -Continue to follow daily weights and strict I's and O's -continue Holding diuretics in the setting of worsening renal function at this time. -Continue beta-blocker.  6-essential hypertension -Continue to closely follow vital sign -Blood pressure fluctuating and continue to be slightly elevated -Will continue the use of PRN hydralazine. -Continue beta-blocker. -Avoid hypotension.   DVT prophylaxis: Heparin Code Status: Full code. Family Communication: No family at bedside. Unable to reach brother over the phone. Disposition:   Status is: Inpatient  Remains inpatient appropriate because:IV treatments appropriate due to intensity of illness or inability to take PO  Dispo: The patient is from: SNF              Anticipated d/c is to: SNF              Patient currently is not medically stable to d/c.   Difficult to place patient No    Consultants:  Nephrology service Palliative care   Procedures:  See below for x-ray reports  Antimicrobials:  None  Subjective: No CP, no no nausea, no vomiting. Was slightly more confused today. Able to answer simple yes/no questions; but  demonstrating no good insight.  Objective: Vitals:   06/05/21 2148 06/06/21 0457 06/06/21 0900 06/06/21 1344  BP: (!) 162/84 (!) 146/78  (!) 149/89  Pulse:  (!) 48  68  Resp:  18  18  Temp:  97.9 F (36.6 C)  (!) 97.5 F (36.4 C)  TempSrc:  Oral  Oral  SpO2:  97%  98%  Weight:  100.1 kg 103.7 kg   Height:        Intake/Output Summary (Last 24 hours) at 06/06/2021 1417 Last data filed at 06/06/2021 0600 Gross per 24 hour  Intake 897.5 ml  Output 1751 ml  Net -853.5 ml   Filed Weights   06/05/21 0500 06/06/21 0457 06/06/21 0900  Weight: 97 kg 100.1 kg 103.7 kg    Examination: General exam: oriented X1, demonstrating poor insight, no CP, no nausea, no vomiting. Afebrile.  Respiratory system: no wheezing, no crackles. Good air movement bilaterally Cardiovascular system:RRR. No rubs, no gallops, no JVD. Gastrointestinal system: Abdomen is nondistended, soft and nontender. No organomegaly or masses felt. Normal bowel sounds heard. Central nervous system: Alert and oriented. No focal neurological deficits. Extremities: No cyanosis, no clubbing.  Skin: No petechiae. Psychiatry: Mood & affect appropriate.    Data Reviewed: I have personally reviewed following labs and imaging studies  CBC: Recent Labs  Lab 06/02/21 1627 06/03/21 0509 06/04/21 0628  WBC 12.3* 13.0* 14.5*  NEUTROABS 8.2* 9.4*  --   HGB 11.0* 10.2* 10.5*  HCT 35.9* 32.7* 34.2*  MCV 92.1 91.3 92.9  PLT 222 201 [id number]    Basic Metabolic Panel: Recent Labs  Lab 06/02/21 1627 06/03/21 0509 06/03/21 0911 06/04/21 0628 06/05/21 0151 06/06/21 0635  NA 133* 135 135 136 137 135  K 5.6* 6.0* 6.0* 5.2* 5.0 4.1  CL 106 109 109 106 108 109  CO2 21* 21* 19* 20* 21* 18*  GLUCOSE 95 78 71 72 79 80  BUN 117* 120* 119* 110* 103* 104*  CREATININE 8.54* 8.42* 8.49* 8.37* 8.61* 9.33*  CALCIUM 7.6* 7.5* 7.4* 8.0* 7.8* 7.5*  MG 3.2* 3.0*  --   --   --   --   PHOS  --   --  6.2* 6.0* 6.3* 7.0*    GFR: Estimated  Creatinine Clearance: 9.2 mL/min (A) (by C-G formula based on SCr of 9.33 mg/dL (H)).  Liver Function Tests: Recent Labs  Lab 06/02/21 1627 06/03/21 0509 06/03/21 0911 06/04/21 0628 06/05/21 0151 06/06/21 0635  AST 9* 8*  --   --   --   --   ALT <5 <5  --   --   --   --   ALKPHOS 58 48  --   --   --   --   BILITOT 0.7 0.6  --   --   --   --   PROT 6.0* 5.6*  --   --   --   --   ALBUMIN 2.2* 2.1* 2.1* 2.3* 2.0* 2.1*    CBG: No results for input(s): GLUCAP in the last 168 hours.   Recent Results (from the past 240 hour(s))  Urine Culture     Status: None   Collection Time: 06/02/21  6:04 PM   Specimen: Urine, Clean Catch  Result Value Ref Range Status   Specimen Description   Final    URINE, CLEAN CATCH Performed at [facility], [address], [place], [place] [address]    Special Requests   Final    NONE Performed at [facility], [address], [place], [place] [address]    Culture   Final    NO GROWTH Performed at [facility], [place] [address] [place], [place] [address]    Report Status 06/04/2021 FINAL  Final  Resp Panel by RT-PCR (Flu A&B, Covid) Nasopharyngeal Swab     Status: None   Collection Time: 06/02/21  7:08 PM   Specimen: Nasopharyngeal Swab; Nasopharyngeal(NP) swabs in vial transport medium  Result Value Ref Range Status   SARS Coronavirus 2 by RT PCR NEGATIVE NEGATIVE Final    Comment: (NOTE) SARS-CoV-2 target nucleic acids are NOT DETECTED.  The SARS-CoV-2 RNA is generally detectable in upper respiratory specimens during the acute phase of infection. The lowest concentration of SARS-CoV-2 viral copies this assay can detect is 138 copies/mL. A negative result does not preclude SARS-Cov-2 infection and should not be used as the sole basis for treatment or other patient management decisions. A negative result may occur with  improper specimen collection/handling, submission of specimen other than nasopharyngeal swab, presence of viral  mutation(s) within the areas targeted by this assay, and inadequate number of viral copies(<138 copies/mL). A negative result must be combined with clinical observations, patient history, and epidemiological information. The expected result is Negative.  Fact Sheet for Patients:  EntrepreneurPulse.com.au  Fact Sheet for Healthcare Providers:  IncredibleEmployment.be  This test is no t yet approved or cleared by the [place] FDA and  has been authorized for detection and/or diagnosis of SARS-CoV-2 by FDA under an Emergency Use Authorization (EUA). This EUA will remain  in effect (meaning this test can be used) for the duration of the COVID-19 declaration under Section 564(b)(1) of the Act, 21 U.S.C.section 360bbb-3(b)(1), unless the authorization is terminated  or revoked sooner.       Influenza A by PCR NEGATIVE NEGATIVE Final   Influenza B by PCR NEGATIVE NEGATIVE Final    Comment: (NOTE) The Xpert Xpress SARS-CoV-2/FLU/RSV plus assay is intended as an aid in the diagnosis of influenza from Nasopharyngeal swab specimens and should not be used as a sole basis for treatment. Nasal washings and aspirates are unacceptable for Xpert Xpress SARS-CoV-2/FLU/RSV testing.  Fact Sheet for Patients: EntrepreneurPulse.com.au  Fact Sheet for Healthcare Providers: IncredibleEmployment.be  This test is not yet approved or cleared by the [place] FDA and has been authorized for detection and/or diagnosis of SARS-CoV-2 by FDA under an Emergency Use Authorization (EUA). This EUA will remain in effect (meaning this test can be used) for the duration of the COVID-19 declaration under Section 564(b)(1) of the Act, 21 U.S.C. section 360bbb-3(b)(1), unless the authorization is terminated or revoked.  Performed at [facility], [address], [place], [place] [address]      Radiology Studies: No results  found.   Scheduled Meds:  amiodarone  200 mg Oral QHS   atorvastatin  10 mg Oral QHS   bisacodyl  10 mg Oral Daily   carbidopa-levodopa  1 tablet Oral BID   Chlorhexidine Gluconate Cloth  6 each Topical Daily   divalproex  375 mg Oral BID   heparin  5,000 Units Subcutaneous Q8H   levothyroxine  112 mcg Oral Q0600   metoprolol tartrate  50 mg Oral BID   OLANZapine  15 mg Oral QHS   sodium chloride flush  3 mL Intravenous Q12H   sodium zirconium cyclosilicate  10 g Oral BID   Continuous Infusions:     LOS: 4 days    Time spent: 35 minutes    Barton Dubois, MD [name]   To contact the attending provider between 7A-7P or the covering provider during after hours 7P-7A, please log into the web site [email or web address] and access using universal [place] password for that web site. If you do not have the password, please call the hospital operator.  06/06/2021, 2:17 PM

## 2021-06-07 DIAGNOSIS — R4189 Other symptoms and signs involving cognitive functions and awareness: Secondary | ICD-10-CM

## 2021-06-07 LAB — RENAL FUNCTION PANEL
Albumin: 2 g/dL — ABNORMAL LOW (ref 3.5–5.0)
Anion gap: 9 (ref 5–15)
BUN: 102 mg/dL — ABNORMAL HIGH (ref 8–23)
CO2: 18 mmol/L — ABNORMAL LOW (ref 22–32)
Calcium: 7.9 mg/dL — ABNORMAL LOW (ref 8.9–10.3)
Chloride: 110 mmol/L (ref 98–111)
Creatinine, Ser: 8.99 mg/dL — ABNORMAL HIGH (ref 0.61–1.24)
GFR, Estimated: 6 mL/min — ABNORMAL LOW (ref >60)
Glucose, Bld: 83 mg/dL (ref 70–99)
Phosphorus: 7.5 mg/dL — ABNORMAL HIGH (ref 2.5–4.6)
Potassium: 4.2 mmol/L (ref 3.5–5.1)
Sodium: 137 mmol/L (ref 135–145)

## 2021-06-07 MED ORDER — ALBUTEROL SULFATE (2.5 MG/3ML) 0.083% IN NEBU: 2.5000 mg | INHALATION_SOLUTION | RESPIRATORY_TRACT | Status: DC | PRN

## 2021-06-07 MED ORDER — IPRATROPIUM-ALBUTEROL 0.5-2.5 (3) MG/3ML IN SOLN: 3.0000 mL | RESPIRATORY_TRACT | Status: DC | PRN

## 2021-06-07 MED ORDER — IPRATROPIUM-ALBUTEROL 0.5-2.5 (3) MG/3ML IN SOLN
3.0000 mL | Freq: Three times a day (TID) | RESPIRATORY_TRACT | Status: DC
  Administered 2021-06-07: 3 mL via RESPIRATORY_TRACT
  Filled 2021-06-07: qty 3

## 2021-06-07 NOTE — Progress Notes (Addendum)
Palliative  HPI- 70 y.o. male  with past medical history of vascular dementia, Parkinson's disease, a-fib, CKD III, schizoaffective disorder, paranoid schizophrenia, bipolar disorder, chronic diastolic hear failure admitted on 06/02/2021 with acute renal failure- workup for cause in progress- noted that his ASO was significantly elevated indicating possible glomerulonephritis s/p strep a infection? Other causes being considered are Invanz injury, myeloma (spep/upep are pending). Urine output is increasing- BUN is down, Cr continues to be elevated. Palliative medicine consulted for [place] as patient would not be a candidate for dialysis. Patient has exhibited cognitive deficits and displayed a lack of capacity for medical decision making. Family is unreachable. Social work contacted [place].  S- Todd Page was being assessed by Lenna Sciara from [place] on my arrival. She is familiar with Todd Page due visiting his facility.  I attempted goals of care discussion in her presence. Todd Page was tearful during our discussion, and lacked capacity to participate in higher level discussion.  Discussed with Lenna Sciara- she concurs that patient has cognitive deficits- and patient has admitted to her his concern regarding his memory and thinking changes.  Official MMSE was not completed due to patient becoming upset and frustrated with assessment per Melissa. Will attempt MMSE tomorrow for supporting documentation of patient's actual cognitive ability. Melissa notes that DSS will not seek emergency guardianship but are likely to file and seek long term guardianship.   Exam- Awake, alert, oriented to person and place, wheezing, feeding himself  A/P -Acute renal failure- appears to be improving  today with Cr and BUN down- making urine- continue current care -Wheezing on exam- nursing notified- they contacted respiratory therapy -Cognitive deficit r/t multiple chronic illnesses including Parkinson's, vascular dementia, depression,  schizoaffective disorder and more- does not have capacity for medical decision making- DSS to file for guardianship that may take time- currently not imminently dying and no emergent decisions needed however he does need long term advance care planning and decisions related to code status- I would not recommend CPR in light of his multiple chronic life limiting illnesses- CPR would not restore him to a previous state of functioning. Will return tomorrow and conduct official MMSE and document score.  Mariana Kaufman, AGNP-C Palliative Medicine  Total time: 39 minutes  Greater than 50%  of this time was spent counseling and coordinating care related to the above assessment and plan.

## 2021-06-07 NOTE — Progress Notes (Signed)
PROGRESS NOTE    Todd Page  [id number] DOB: [birth year] DOA: 06/02/2021 PCP: Hilbert Corrigan, MD   Chief Complaint  Patient presents with   abnomal lab    Brief admission narrative:  As per H&P written by Dr. Bridgett Larsson on 06/02/2021 70 year old male with a history of Parkinson's disease, paroxysmal atrial fibrillation, CKD stage III with a baseline creatinine of 1.9, schizoaffective disorder, paranoid schizophrenia, vascular dementia, bipolar disorder, chronic diastolic heart failure who presents to the ER for acute renal failure.  According to the documentation sent from the nursing home, the patient was started on Invanz via midline on 05/31/2021 for diagnosis of pneumonia.  There is no chest x-ray report that was sent.  He also had a Foley catheter placed on 06/01/2021.  Labs were drawn today at the nursing home and the patient was noted to have a BUN of 115 with a creatinine of 7.5.  Potassium was elevated 6.0.  Bicarbonate was 20.  He was sent to the ER for further evaluation.   On repeat testing here, patient noted to have a potassium of 5.6, BUN of 117, creatinine of 8.5.   Urinalysis demonstrated large cassette esterase with negative nitrites.  Urine specific gravity 1.008.  White count elevated at 12.3.  Assessment & Plan: 1-hyperkalemia/acute on chronic renal failure (ARF) (HCC) -Stage IIIb at baseline -Unclear etiology at this moment; but with concern for nephrotoxic agent and use of contrast. -Nephrology service on board and assisting with work-up; recommending to continue IVF's.  -Continue to closely follow renal function trend -Continue treatment of hyperkalemia using Lokelma and sodium bicarbonate; K 4.1 currently.. -Patient with Foley catheter in place (placed at skilled nursing facility prior to admission); CT not demonstrating hydronephrosis or obstruction.  Continue to demonstrate good amount of urine production. -Continue closely following the strict I's and O's. -Cr  continue rising, BUN plateau, but above 100 range. -Following renal service rec's, given that patient is not a candidate for long-term hemodialysis; palliative care consultation has been recommended and will follow results of [place] discussion. Appreciate palliative care assistance and help, so far having difficulties contacting next of skin.  2-Atrial fibrillation ([place]) -Rate controlled -Prior to admission no using anticoagulation prior to admission. -Continue the use of amiodarone and metoprolol.  3-Schizoaffective disorder (HCC) -Mood overall stable -Continue supportive care, consult reorientation and nightly olanzapine.  4-Parkinson's disease ([place]) -Continue Sinemet. -Patient wheelchair-bound at baseline.  5-Chronic diastolic CHF (congestive heart failure) (HCC) -Stable and compensated currently -Continue to follow daily weights and strict I's and O's -continue Holding diuretics in the setting of worsening renal function at this time. -Continue beta-blocker.  6-essential hypertension -Continue to closely follow vital sign -Blood pressure fluctuating and continue to be slightly elevated -Will continue the use of PRN hydralazine. -Continue beta-blocker. -Avoid hypotension.  7)Social/Ethics--- DSS visit from [facility] appreciated -Palliative care visit from [facility] appreciated -Patient remains full code -DSS plans to apply for long-term guardianship (this may take a while) - Capacity eval by me on 06/07/21- Patient appears to have cognitive deficit r/t multiple chronic illnesses including Parkinson's, vascular dementia--- Todd Page is  a  70 y.o.  who is AAO x 3 who has a history of cognitive deficits/decline in the setting of vascular dementia, Parkinson's disease and multiple chronic comorbidities, who also has a past medical history of depression and schizoaffective disorder who is  Not able to fully understand (without significant language barrier) his current medical  diagnosis, patient does Not fully understand proposed  treatment options including option of no treatment, the patient does Not appear to fully understand consequences/risk Versus benefit of each treatment option, alternatives as well as the option of no treatment. Based on my evaluation Todd Page  appears to  Not have the Capacity to make decisions and give informed consent about his/her medical care.  A surrogate decision-maker is required as [name]  as it  appears that he does Not have capacity to make his own decisions and or give informed consent regarding his medical care at this time    DVT prophylaxis: Heparin Code Status: Full code. Family Communication: No family at bedside. Melissa from [place] visited Disposition:   Status is: Inpatient  Remains inpatient appropriate because:IV treatments appropriate due to intensity of illness or inability to take PO  Dispo: The patient is from: SNF              Anticipated d/c is to: SNF              Patient currently is not medically stable to d/c.   Difficult to place patient No    Consultants:  Nephrology service Palliative care   Procedures:  See below for x-ray reports  Antimicrobials:  None   Subjective: -Fair oral intake, no emesis no fever  Objective: Vitals:   06/07/21 0411 06/07/21 0500 06/07/21 1400 06/07/21 1440  BP: (!) 153/97   (!) 137/95  Pulse: 67   (!) 52  Resp: 19   18  Temp: 98.3 F (36.8 C)   (!) 97.5 F (36.4 C)  TempSrc:    Oral  SpO2: 100%  100% 97%  Weight:  101 kg    Height:        Intake/Output Summary (Last 24 hours) at 06/07/2021 1731 Last data filed at 06/07/2021 1500 Gross per 24 hour  Intake 1370.07 ml  Output 400 ml  Net 970.07 ml   Filed Weights   06/06/21 0457 06/06/21 0900 06/07/21 0500  Weight: 100.1 kg 103.7 kg 101 kg    Examination: General exam: oriented X1, demonstrating poor insight, no CP, no nausea, no vomiting. Afebrile.  Respiratory system: no wheezing, no  crackles. Good air movement bilaterally Cardiovascular system:RRR. No rubs, no gallops, no JVD. Gastrointestinal system: Abdomen is nondistended, soft and nontender. No organomegaly or masses felt. Normal bowel sounds heard. Central nervous system: Alert and oriented. No focal neurological deficits. Extremities: No cyanosis, no clubbing.  Skin: No petechiae. NeuroPsychiatry: Wheelchair-bound at baseline due to Parkinson's, no new additional focal deficits, baseline cognitive and memory deficits noted  Data Reviewed: I have personally reviewed following labs and imaging studies  CBC: Recent Labs  Lab 06/02/21 1627 06/03/21 0509 06/04/21 0628  WBC 12.3* 13.0* 14.5*  NEUTROABS 8.2* 9.4*  --   HGB 11.0* 10.2* 10.5*  HCT 35.9* 32.7* 34.2*  MCV 92.1 91.3 92.9  PLT 222 201 [id number]    Basic Metabolic Panel: Recent Labs  Lab 06/02/21 1627 06/03/21 0509 06/03/21 0911 06/04/21 0628 06/05/21 0151 06/06/21 0635 06/07/21 0427  NA 133* 135 135 136 137 135 137  K 5.6* 6.0* 6.0* 5.2* 5.0 4.1 4.2  CL 106 109 109 106 108 109 110  CO2 21* 21* 19* 20* 21* 18* 18*  GLUCOSE 95 78 71 72 79 80 83  BUN 117* 120* 119* 110* 103* 104* 102*  CREATININE 8.54* 8.42* 8.49* 8.37* 8.61* 9.33* 8.99*  CALCIUM 7.6* 7.5* 7.4* 8.0* 7.8* 7.5* 7.9*  MG 3.2* 3.0*  --   --   --   --   --  PHOS  --   --  6.2* 6.0* 6.3* 7.0* 7.5*    GFR: Estimated Creatinine Clearance: 9.4 mL/min (A) (by C-G formula based on SCr of 8.99 mg/dL (H)).  Liver Function Tests: Recent Labs  Lab 06/02/21 1627 06/03/21 0509 06/03/21 0911 06/04/21 [id number] 06/05/21 0151 06/06/21 0635 06/07/21 0427  AST 9* 8*  --   --   --   --   --   ALT <5 <5  --   --   --   --   --   ALKPHOS 58 48  --   --   --   --   --   BILITOT 0.7 0.6  --   --   --   --   --   PROT 6.0* 5.6*  --   --   --   --   --   ALBUMIN 2.2* 2.1* 2.1* 2.3* 2.0* 2.1* 2.0*    CBG: No results for input(s): GLUCAP in the last 168 hours.   Recent Results (from the past  240 hour(s))  Urine Culture     Status: None   Collection Time: 06/02/21  6:04 PM   Specimen: Urine, Clean Catch  Result Value Ref Range Status   Specimen Description   Final    URINE, CLEAN CATCH Performed at [facility], [address], [place], [place] [address]    Special Requests   Final    NONE Performed at [facility], [address], [place], [place] [address]    Culture   Final    NO GROWTH Performed at [facility], [place] [address], [place],  [address]    Report Status 06/04/2021 FINAL  Final  Resp Panel by RT-PCR (Flu A&B, Covid) Nasopharyngeal Swab     Status: None   Collection Time: 06/02/21  7:08 PM   Specimen: Nasopharyngeal Swab; Nasopharyngeal(NP) swabs in vial transport medium  Result Value Ref Range Status   SARS Coronavirus 2 by RT PCR NEGATIVE NEGATIVE Final    Comment: (NOTE) SARS-CoV-2 target nucleic acids are NOT DETECTED.  The SARS-CoV-2 RNA is generally detectable in upper respiratory specimens during the acute phase of infection. The lowest concentration of SARS-CoV-2 viral copies this assay can detect is 138 copies/mL. A negative result does not preclude SARS-Cov-2 infection and should not be used as the sole basis for treatment or other patient management decisions. A negative result may occur with  improper specimen collection/handling, submission of specimen other than nasopharyngeal swab, presence of viral mutation(s) within the areas targeted by this assay, and inadequate number of viral copies(<138 copies/mL). A negative result must be combined with clinical observations, patient history, and epidemiological information. The expected result is Negative.  Fact Sheet for Patients:  EntrepreneurPulse.com.au  Fact Sheet for Healthcare Providers:  IncredibleEmployment.be  This test is no t yet approved or cleared by the [place] FDA and  has been authorized for detection and/or diagnosis of  SARS-CoV-2 by FDA under an Emergency Use Authorization (EUA). This EUA will remain  in effect (meaning this test can be used) for the duration of the COVID-19 declaration under Section 564(b)(1) of the Act, 21 U.S.C.section 360bbb-3(b)(1), unless the authorization is terminated  or revoked sooner.       Influenza A by PCR NEGATIVE NEGATIVE Final   Influenza B by PCR NEGATIVE NEGATIVE Final    Comment: (NOTE) The Xpert Xpress SARS-CoV-2/FLU/RSV plus assay is intended as an aid in the diagnosis of influenza from Nasopharyngeal swab specimens  and should not be used as a sole basis for treatment. Nasal washings and aspirates are unacceptable for Xpert Xpress SARS-CoV-2/FLU/RSV testing.  Fact Sheet for Patients: EntrepreneurPulse.com.au  Fact Sheet for Healthcare Providers: IncredibleEmployment.be  This test is not yet approved or cleared by the [place] FDA and has been authorized for detection and/or diagnosis of SARS-CoV-2 by FDA under an Emergency Use Authorization (EUA). This EUA will remain in effect (meaning this test can be used) for the duration of the COVID-19 declaration under Section 564(b)(1) of the Act, 21 U.S.C. section 360bbb-3(b)(1), unless the authorization is terminated or revoked.  Performed at [facility], [address], [place], [place] [address]      Radiology Studies: No results found.   Scheduled Meds:  amiodarone  200 mg Oral QHS   atorvastatin  10 mg Oral QHS   bisacodyl  10 mg Oral Daily   carbidopa-levodopa  1 tablet Oral BID   Chlorhexidine Gluconate Cloth  6 each Topical Daily   divalproex  375 mg Oral BID   heparin  5,000 Units Subcutaneous Q8H   ipratropium-albuterol  3 mL Nebulization TID   levothyroxine  112 mcg Oral Q0600   metoprolol tartrate  50 mg Oral BID   OLANZapine  15 mg Oral QHS   sodium chloride flush  3 mL Intravenous Q12H   Continuous Infusions:   LOS: 5 days   Roxan Hockey, MD [name]   To contact the attending provider between 7A-7P or the covering provider during after hours 7P-7A, please log into the web site [email or web address] and access using universal [place] password for that web site. If you do not have the password, please call the hospital operator.  06/07/2021, 5:31 PM

## 2021-06-07 NOTE — Progress Notes (Signed)
Patient ID: Todd Page, male   DOB: 1951/09/13, 70 y.o.   MRN: [id number] S: Complaining of left arm pain.  No N/V/SOB O:BP (!) 153/97 (BP Location: Right Arm)   Pulse 67   Temp 98.3 F (36.8 C)   Resp 19   Ht '5\' 11"'$  (1.803 m)   Wt 101 kg   SpO2 100%   BMI 31.06 kg/m   Intake/Output Summary (Last 24 hours) at 06/07/2021 1041 Last data filed at 06/07/2021 0900 Gross per 24 hour  Intake 1250.07 ml  Output 400 ml  Net 850.07 ml   Intake/Output: I/O last 3 completed shifts: In: 1667.6 [P.O.:320; I.V.:1347.6] Out: 2152 [Urine:2150; Stool:2]  Intake/Output this shift:  Total I/O In: 480 [P.O.:480] Out: -  Weight change: 3.6 kg Gen:NAD CVS:RRR Resp: CTA with poor inspiratory effort Abd:+BS, soft, NT/ND Ext: trace presacral edema  Recent Labs  Lab 06/02/21 1627 06/03/21 0509 06/03/21 0911 06/04/21 0628 06/05/21 0151 06/06/21 0635 06/07/21 0427  NA 133* 135 135 136 137 135 137  K 5.6* 6.0* 6.0* 5.2* 5.0 4.1 4.2  CL 106 109 109 106 108 109 110  CO2 21* 21* 19* 20* 21* 18* 18*  GLUCOSE 95 78 71 72 79 80 83  BUN 117* 120* 119* 110* 103* 104* 102*  CREATININE 8.54* 8.42* 8.49* 8.37* 8.61* 9.33* 8.99*  ALBUMIN 2.2* 2.1* 2.1* 2.3* 2.0* 2.1* 2.0*  CALCIUM 7.6* 7.5* 7.4* 8.0* 7.8* 7.5* 7.9*  PHOS  --   --  6.2* 6.0* 6.3* 7.0* 7.5*  AST 9* 8*  --   --   --   --   --   ALT <5 <5  --   --   --   --   --    Liver Function Tests: Recent Labs  Lab 06/02/21 1627 06/03/21 0509 06/03/21 0911 06/05/21 0151 06/06/21 0635 06/07/21 0427  AST 9* 8*  --   --   --   --   ALT <5 <5  --   --   --   --   ALKPHOS 58 48  --   --   --   --   BILITOT 0.7 0.6  --   --   --   --   PROT 6.0* 5.6*  --   --   --   --   ALBUMIN 2.2* 2.1*   < > 2.0* 2.1* 2.0*   < > = values in this interval not displayed.   No results for input(s): LIPASE, AMYLASE in the last 168 hours. Recent Labs  Lab 06/02/21 1627  AMMONIA 15   CBC: Recent Labs  Lab 06/02/21 1627 06/03/21 0509 06/04/21 0628  WBC  12.3* 13.0* 14.5*  NEUTROABS 8.2* 9.4*  --   HGB 11.0* 10.2* 10.5*  HCT 35.9* 32.7* 34.2*  MCV 92.1 91.3 92.9  PLT 222 201 207   Cardiac Enzymes: Recent Labs  Lab 06/02/21 1912  CKTOTAL 19*   CBG: No results for input(s): GLUCAP in the last 168 hours.  Iron Studies: No results for input(s): IRON, TIBC, TRANSFERRIN, FERRITIN in the last 72 hours. Studies/Results: No results found.  amiodarone  200 mg Oral QHS   atorvastatin  10 mg Oral QHS   bisacodyl  10 mg Oral Daily   carbidopa-levodopa  1 tablet Oral BID   Chlorhexidine Gluconate Cloth  6 each Topical Daily   divalproex  375 mg Oral BID   heparin  5,000 Units Subcutaneous Q8H   levothyroxine  112 mcg Oral  Q0600   metoprolol tartrate  50 mg Oral BID   OLANZapine  15 mg Oral QHS   sodium chloride flush  3 mL Intravenous Q12H    BMET    Component Value Date/Time   NA 137 06/07/2021 0427   NA 145 11/28/2014 0455   K 4.2 06/07/2021 0427   K 3.6 11/28/2014 0455   CL 110 06/07/2021 0427   CL 117 (H) 11/28/2014 0455   CO2 18 (L) 06/07/2021 0427   CO2 26 11/28/2014 0455   GLUCOSE 83 06/07/2021 0427   GLUCOSE 81 11/28/2014 0455   BUN 102 (H) 06/07/2021 0427   BUN 17 11/28/2014 0455   CREATININE 8.99 (H) 06/07/2021 0427   CREATININE 1.17 11/28/2014 0455   CALCIUM 7.9 (L) 06/07/2021 0427   CALCIUM 7.9 (L) 11/28/2014 0455   GFRNONAA 6 (L) 06/07/2021 0427   GFRNONAA >60 11/28/2014 0455   GFRAA 39 (L) 08/16/2019 1327   GFRAA >60 11/28/2014 0455   CBC    Component Value Date/Time   WBC 14.5 (H) 06/04/2021 0628   RBC 3.68 (L) 06/04/2021 0628   HGB 10.5 (L) 06/04/2021 0628   HGB 9.0 (L) 11/30/2014 0912   HCT 34.2 (L) 06/04/2021 0628   HCT 27.2 (L) 11/29/2014 0438   PLT 207 06/04/2021 0628   PLT 139 (L) 11/29/2014 0438   MCV 92.9 06/04/2021 0628   MCV 92 11/29/2014 0438   MCH 28.5 06/04/2021 0628   MCHC 30.7 06/04/2021 0628   RDW 15.8 (H) 06/04/2021 0628   RDW 15.0 (H) 11/29/2014 0438   LYMPHSABS 2.5  06/03/2021 0509   LYMPHSABS 0.9 (L) 11/29/2014 0438   MONOABS 0.7 06/03/2021 0509   MONOABS 0.7 11/29/2014 0438   EOSABS 0.1 06/03/2021 0509   EOSABS 0.2 11/29/2014 0438   BASOSABS 0.0 06/03/2021 0509   BASOSABS 0.0 11/29/2014 0438    Assessment/Plan:  AKI/CKD stage III, non-oliguric - unclear etiology.  If he was only started on IV abx 2 days prior to admission, not enough time to develop AIN (needs generally 7-10 days of antibiotics before developing AIN but it is unclear exactly what he was getting and for how long).  He was given CT with IV contrast on 03/22/21 and Scr was 1.9 at that time but never rechecked.  He has marked azotemia and CT scan without obstruction.  CK normal as well as C3 and C4.   Significant urine output after IVF's and BUN and SCr finally starting to improve.  Hopefully seeing the start of renal recovery.  No urgent indication for dialysis at this time. Acute GN workup: negative ANCA, anti-GBM, normal complements but did have a markedly elevated ASO titer (although normal C3), and negative SPEP.  ANA pending.  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   Pt is not a suitable candidate for longterm dialysis given his poor functional, cognitive, and nutritional status.  Appreciate palliative care consult to help set goals/limits of care.  Since his family could not be reached SW/CM contacted DSS who will evaluate Mr. Halas for guardianship (he is not competent to make decisions for himself). Hyperkalemia - due to #1- will treat with lokelma and IV bicarb. Slowly improving.  Parkinson's disease - wheelchair bound. Schizoaffective disorder - no documentation of lithium use. Atrial fibrillation - per primary Chronic diastolic CHF - torsemide on hold PNA/UTI - no evidence of PNA on CXR. Disposition - poor overall prognosis as above.   Donetta Potts, MD [employer] [phone]

## 2021-06-08 DIAGNOSIS — F0390 Unspecified dementia without behavioral disturbance: Secondary | ICD-10-CM

## 2021-06-08 LAB — CBC
HCT: 26.9 % — ABNORMAL LOW (ref 39.0–52.0)
Hemoglobin: 8.5 g/dL — ABNORMAL LOW (ref 13.0–17.0)
MCH: 28.8 pg (ref 26.0–34.0)
MCHC: 31.6 g/dL (ref 30.0–36.0)
MCV: 91.2 fL (ref 80.0–100.0)
Platelets: 112 10*3/uL — ABNORMAL LOW (ref 150–400)
RBC: 2.95 MIL/uL — ABNORMAL LOW (ref 4.22–5.81)
RDW: 15.3 % (ref 11.5–15.5)
WBC: 10.2 10*3/uL (ref 4.0–10.5)
nRBC: 0 % (ref 0.0–0.2)

## 2021-06-08 LAB — RENAL FUNCTION PANEL
Albumin: 2.1 g/dL — ABNORMAL LOW (ref 3.5–5.0)
Anion gap: 10 (ref 5–15)
BUN: 99 mg/dL — ABNORMAL HIGH (ref 8–23)
CO2: 18 mmol/L — ABNORMAL LOW (ref 22–32)
Calcium: 8.1 mg/dL — ABNORMAL LOW (ref 8.9–10.3)
Chloride: 110 mmol/L (ref 98–111)
Creatinine, Ser: 9.37 mg/dL — ABNORMAL HIGH (ref 0.61–1.24)
GFR, Estimated: 6 mL/min — ABNORMAL LOW (ref >60)
Glucose, Bld: 77 mg/dL (ref 70–99)
Phosphorus: 7.4 mg/dL — ABNORMAL HIGH (ref 2.5–4.6)
Potassium: 3.9 mmol/L (ref 3.5–5.1)
Sodium: 138 mmol/L (ref 135–145)

## 2021-06-08 LAB — BASIC METABOLIC PANEL
Anion gap: 8 (ref 5–15)
BUN: 100 mg/dL — ABNORMAL HIGH (ref 8–23)
CO2: 18 mmol/L — ABNORMAL LOW (ref 22–32)
Calcium: 8 mg/dL — ABNORMAL LOW (ref 8.9–10.3)
Chloride: 111 mmol/L (ref 98–111)
Creatinine, Ser: 9.4 mg/dL — ABNORMAL HIGH (ref 0.61–1.24)
GFR, Estimated: 6 mL/min — ABNORMAL LOW (ref >60)
Glucose, Bld: 77 mg/dL (ref 70–99)
Potassium: 3.9 mmol/L (ref 3.5–5.1)
Sodium: 137 mmol/L (ref 135–145)

## 2021-06-08 LAB — ANTINUCLEAR ANTIBODIES, IFA: ANA Ab, IFA: NEGATIVE

## 2021-06-08 LAB — GLUCOSE, CAPILLARY: Glucose-Capillary: 90 mg/dL (ref 70–99)

## 2021-06-08 NOTE — Progress Notes (Signed)
PROGRESS NOTE    Todd Page  [id number] DOB: February 14, 1951 DOA: 06/02/2021 PCP: Todd Corrigan, MD   Chief Complaint  Patient presents with   abnomal lab    Brief admission narrative:  As per H&P written by Dr. Bridgett Page on 06/02/2021 69 year old male with a history of Parkinson's disease, paroxysmal atrial fibrillation, CKD stage III with a baseline creatinine of 1.9, schizoaffective disorder, paranoid schizophrenia, vascular dementia, bipolar disorder, chronic diastolic heart failure who presents to the ER for acute renal failure.  According to the documentation sent from the nursing home, the patient was started on Invanz via midline on 05/31/2021 for diagnosis of pneumonia.  There is no chest x-ray report that was sent.  He also had a Foley catheter placed on 06/01/2021.  Labs were drawn today at the nursing home and the patient was noted to have a BUN of 115 with a creatinine of 7.5.  Potassium was elevated 6.0.  Bicarbonate was 20.  He was sent to the ER for further evaluation.   On repeat testing here, patient noted to have a potassium of 5.6, BUN of 117, creatinine of 8.5.   Urinalysis demonstrated large cassette esterase with negative nitrites.  Urine specific gravity 1.008.  White count elevated at 12.3.  Assessment & Plan: 1-hyperkalemia/acute on chronic renal failure (ARF) (HCC) -Stage IIIb at baseline -Unclear etiology at this moment; but with concern for nephrotoxic agent and use of contrast. -Nephrology service on board and assisting with work-up;  -Continue to closely follow renal function trend -Continue treatment of hyperkalemia using Lokelma and sodium bicarbonate; -Patient with Foley catheter in place (placed at skilled nursing facility prior to admission); CT not demonstrating hydronephrosis or obstruction.  Continue to demonstrate good amount of urine production. -Continue closely following the strict I's and O's. -Cr currently around 9, BUN plateau, but above 100  range. -Following renal service rec's, given that patient is not a candidate for long-term hemodialysis; palliative care consultation has been recommended and will follow results of Todd Page discussion. Appreciate palliative care assistance and help, so far having difficulties contacting next of skin.  2-Atrial fibrillation ([place]) -Rate controlled -Prior to admission no using anticoagulation prior to admission. -Continue the use of amiodarone and metoprolol.  3-Schizoaffective disorder (HCC) -Mood overall stable -Continue supportive care, consult reorientation and nightly olanzapine.  4-Parkinson's disease ([place]) -Continue Sinemet. -Patient wheelchair-bound at baseline.  5-Chronic diastolic CHF (congestive heart failure) (HCC) -Stable and compensated currently -Continue to follow daily weights and strict I's and O's -continue Holding diuretics in the setting of worsening renal function at this time. -Continue beta-blocker.  6-essential hypertension -Continue to closely follow vital sign -Blood pressure fluctuating and continue to be slightly elevated -Will continue the use of PRN hydralazine. -Continue beta-blocker. -Avoid hypotension.  7)Social/Ethics--- DSS visit from [facility] appreciated -Palliative care visit from [facility] appreciated -Patient remains full code -DSS plans to apply for long-term guardianship (this may take a while) - Capacity eval by me on 06/07/21- Patient appears to have cognitive deficit r/t multiple chronic illnesses including Parkinson's, vascular dementia--- Todd Page is  a  70 y.o.  who is AAO x 3 who has a history of cognitive deficits/decline in the setting of vascular dementia, Parkinson's disease and multiple chronic comorbidities, who also has a past medical history of depression and schizoaffective disorder who is  Not able to fully understand (without significant language barrier) his current medical diagnosis, patient does Not fully understand proposed  treatment options including option of no  treatment, the patient does Not appear to fully understand consequences/risk Versus benefit of each treatment option, alternatives as well as the option of no treatment.  -Based on my evaluation Todd Page  appears to  Not have the Capacity to make decisions and give informed consent about his medical care.  -MMSE performed on 06/08/21 and he scored a 22 out of 30 indicating mild dementia -Ability to make decisions/capacity may vary from time to time as cognition varies A surrogate decision-maker is required as Todd Page  as it  appears that he does Not have capacity to make his own decisions and or give informed consent regarding his medical care at this time    DVT prophylaxis: Heparin Code Status: Full code. Family Communication: No family at bedside. Todd Page from [place] visited Disposition:   Status is: Inpatient  Remains inpatient appropriate because:IV treatments appropriate due to intensity of illness or inability to take PO  Dispo: The patient is from: SNF              Anticipated d/c is to: SNF              Patient currently is not medically stable to d/c.   Difficult to place patient No    Consultants:  Nephrology service Palliative care   Procedures:  See below for x-ray reports  Antimicrobials:  None   Subjective: Oral intake is fair, -Scrotal swelling noted, denies scrotal pain No vomiting and no fevers  Objective: Vitals:   06/07/21 2024 06/08/21 0412 06/08/21 0557 06/08/21 1340  BP:  (!) 153/82  (!) 152/82  Pulse:  66  62  Resp:  19  18  Temp:  98.2 F (36.8 C)  98 F (36.7 C)  TempSrc:    Oral  SpO2: 98% 98%  99%  Weight:   99 kg   Height:        Intake/Output Summary (Last 24 hours) at 06/08/2021 1759 Last data filed at 06/08/2021 0900 Gross per 24 hour  Intake 840 ml  Output 300 ml  Net 540 ml   Filed Weights   06/06/21 0900 06/07/21 0500 06/08/21 0557  Weight: 103.7 kg 101 kg 99 kg     Examination: General exam:  awake , in no apparent distress  Respiratory system: no wheezing, no crackles. Good air movement bilaterally Cardiovascular system:RRR. No rubs, no gallops, no JVD. Gastrointestinal system: Abdomen is nondistended, soft and nontender. . Normal bowel sounds heard. Extremities: No cyanosis, no clubbing.  Skin: No petechiae. NeuroPsychiatry: Wheelchair-bound at baseline due to Parkinson's, no new additional focal neuro deficits, baseline cognitive and memory deficits noted GU--scrotal swelling noted  Data Reviewed: I have personally reviewed following labs and imaging studies  CBC: Recent Labs  Lab 06/02/21 1627 06/03/21 0509 06/04/21 0628 06/08/21 0432  WBC 12.3* 13.0* 14.5* 10.2  NEUTROABS 8.2* 9.4*  --   --   HGB 11.0* 10.2* 10.5* 8.5*  HCT 35.9* 32.7* 34.2* 26.9*  MCV 92.1 91.3 92.9 91.2  PLT 222 201 207 112*    Basic Metabolic Panel: Recent Labs  Lab 06/02/21 1627 06/03/21 0509 06/03/21 0911 06/04/21 0628 06/05/21 0151 06/06/21 0635 06/07/21 0427 06/08/21 0432  NA 133* 135   < > 136 137 135 137 138  137  K 5.6* 6.0*   < > 5.2* 5.0 4.1 4.2 3.9  3.9  CL 106 109   < > 106 108 109 110 110  111  CO2 21* 21*   < > 20*  21* 18* 18* 18*  18*  GLUCOSE 95 78   < > 72 79 80 83 77  77  BUN 117* 120*   < > 110* 103* 104* 102* 99*  100*  CREATININE 8.54* 8.42*   < > 8.37* 8.61* 9.33* 8.99* 9.37*  9.40*  CALCIUM 7.6* 7.5*   < > 8.0* 7.8* 7.5* 7.9* 8.1*  8.0*  MG 3.2* 3.0*  --   --   --   --   --   --   PHOS  --   --    < > 6.0* 6.3* 7.0* 7.5* 7.4*   < > = values in this interval not displayed.    GFR: Estimated Creatinine Clearance: 8.9 mL/min (A) (by C-G formula based on SCr of 9.37 mg/dL (H)).  Liver Function Tests: Recent Labs  Lab 06/02/21 1627 06/03/21 0509 06/03/21 0911 06/04/21 [id number] 06/05/21 0151 06/06/21 0635 06/07/21 0427 06/08/21 0432  AST 9* 8*  --   --   --   --   --   --   ALT <5 <5  --   --   --   --   --   --    ALKPHOS 58 48  --   --   --   --   --   --   BILITOT 0.7 0.6  --   --   --   --   --   --   PROT 6.0* 5.6*  --   --   --   --   --   --   ALBUMIN 2.2* 2.1*   < > 2.3* 2.0* 2.1* 2.0* 2.1*   < > = values in this interval not displayed.    CBG: Recent Labs  Lab 06/08/21 1559  GLUCAP 90     Recent Results (from the past 240 hour(s))  Urine Culture     Status: None   Collection Time: 06/02/21  6:04 PM   Specimen: Urine, Clean Catch  Result Value Ref Range Status   Specimen Description   Final    URINE, CLEAN CATCH Performed at [facility], [address], [place], [place] [address]    Special Requests   Final    NONE Performed at [facility], [address] [place], [place] [address]    Culture   Final    NO GROWTH Performed at [facility], [place] [address], [place], [place] [address]    Report Status 06/04/2021 FINAL  Final  Resp Panel by RT-PCR (Flu A&B, Covid) Nasopharyngeal Swab     Status: None   Collection Time: 06/02/21  7:08 PM   Specimen: Nasopharyngeal Swab; Nasopharyngeal(NP) swabs in vial transport medium  Result Value Ref Range Status   SARS Coronavirus 2 by RT PCR NEGATIVE NEGATIVE Final    Comment: (NOTE) SARS-CoV-2 target nucleic acids are NOT DETECTED.  The SARS-CoV-2 RNA is generally detectable in upper respiratory specimens during the acute phase of infection. The lowest concentration of SARS-CoV-2 viral copies this assay can detect is 138 copies/mL. A negative result does not preclude SARS-Cov-2 infection and should not be used as the sole basis for treatment or other patient management decisions. A negative result may occur with  improper specimen collection/handling, submission of specimen other than nasopharyngeal swab, presence of viral mutation(s) within the areas targeted by this assay, and inadequate number of viral copies(<138 copies/mL). A negative result must be combined with clinical observations, patient history, and  epidemiological information. The expected  result is Negative.  Fact Sheet for Patients:  EntrepreneurPulse.com.au  Fact Sheet for Healthcare Providers:  IncredibleEmployment.be  This test is no t yet approved or cleared by the [place] FDA and  has been authorized for detection and/or diagnosis of SARS-CoV-2 by FDA under an Emergency Use Authorization (EUA). This EUA will remain  in effect (meaning this test can be used) for the duration of the COVID-19 declaration under Section 564(b)(1) of the Act, 21 U.S.C.section 360bbb-3(b)(1), unless the authorization is terminated  or revoked sooner.       Influenza A by PCR NEGATIVE NEGATIVE Final   Influenza B by PCR NEGATIVE NEGATIVE Final    Comment: (NOTE) The Xpert Xpress SARS-CoV-2/FLU/RSV plus assay is intended as an aid in the diagnosis of influenza from Nasopharyngeal swab specimens and should not be used as a sole basis for treatment. Nasal washings and aspirates are unacceptable for Xpert Xpress SARS-CoV-2/FLU/RSV testing.  Fact Sheet for Patients: EntrepreneurPulse.com.au  Fact Sheet for Healthcare Providers: IncredibleEmployment.be  This test is not yet approved or cleared by the [place] FDA and has been authorized for detection and/or diagnosis of SARS-CoV-2 by FDA under an Emergency Use Authorization (EUA). This EUA will remain in effect (meaning this test can be used) for the duration of the COVID-19 declaration under Section 564(b)(1) of the Act, 21 U.S.C. section 360bbb-3(b)(1), unless the authorization is terminated or revoked.  Performed at [facility], [address], [place], [place] [address]      Radiology Studies: No results found.   Scheduled Meds:  amiodarone  200 mg Oral QHS   atorvastatin  10 mg Oral QHS   bisacodyl  10 mg Oral Daily   carbidopa-levodopa  1 tablet Oral BID   Chlorhexidine Gluconate Cloth  6 each  Topical Daily   divalproex  375 mg Oral BID   heparin  5,000 Units Subcutaneous Q8H   levothyroxine  112 mcg Oral Q0600   metoprolol tartrate  50 mg Oral BID   OLANZapine  15 mg Oral QHS   sodium chloride flush  3 mL Intravenous Q12H   Continuous Infusions:   LOS: 6 days   Roxan Hockey, MD [name]   To contact the attending provider between 7A-7P or the covering provider during after hours 7P-7A, please log into the web site [email or web address] and access using universal [place] password for that web site. If you do not have the password, please call the hospital operator.  06/08/2021, 5:59 PM

## 2021-06-08 NOTE — Progress Notes (Signed)
Patient ID: Todd Page, male   DOB: [birth year], 70 y.o.   MRN: [id number]   S: Sleeping when I walked in, breakfast tray cleaned.  No complaints when awakened.    O:BP (!) 153/82 (BP Location: Right Arm)   Pulse 66   Temp 98.2 F (36.8 C)   Resp 19   Ht '5\' 11"'$  (1.803 m)   Wt 99 kg   SpO2 98%   BMI 30.44 kg/m   Intake/Output Summary (Last 24 hours) at 06/08/2021 0957 Last data filed at 06/08/2021 0554 Gross per 24 hour  Intake 600 ml  Output 300 ml  Net 300 ml   Intake/Output: I/O last 3 completed shifts: In: 1730.1 [P.O.:1080; I.V.:650.1] Out: 700 [Urine:700]   Intake/Output this shift:  No intake/output data recorded. Weight change: -4.7 kg  Gen:NAD, sleeping but easily arousable CVS:RRR Resp: CTA anteriorly, shallow breaths Abd:+BS, soft, NT/ND  Ext: no LE edema, some slight flank edema  Recent Labs  Lab 06/02/21 1627 06/03/21 0509 06/03/21 0911 06/04/21 0628 06/05/21 0151 06/06/21 0635 06/07/21 0427 06/08/21 0432  NA 133* 135 135 136 137 135 137 138  137  K 5.6* 6.0* 6.0* 5.2* 5.0 4.1 4.2 3.9  3.9  CL 106 109 109 106 108 109 110 110  111  CO2 21* 21* 19* 20* 21* 18* 18* 18*  18*  GLUCOSE 95 78 71 72 79 80 83 77  77  BUN 117* 120* 119* 110* 103* 104* 102* 99*  100*  CREATININE 8.54* 8.42* 8.49* 8.37* 8.61* 9.33* 8.99* 9.37*  9.40*  ALBUMIN 2.2* 2.1* 2.1* 2.3* 2.0* 2.1* 2.0* 2.1*  CALCIUM 7.6* 7.5* 7.4* 8.0* 7.8* 7.5* 7.9* 8.1*  8.0*  PHOS  --   --  6.2* 6.0* 6.3* 7.0* 7.5* 7.4*  AST 9* 8*  --   --   --   --   --   --   ALT <5 <5  --   --   --   --   --   --    Liver Function Tests: Recent Labs  Lab 06/02/21 1627 06/03/21 0509 06/03/21 0911 06/06/21 0635 06/07/21 0427 06/08/21 0432  AST 9* 8*  --   --   --   --   ALT <5 <5  --   --   --   --   ALKPHOS 58 48  --   --   --   --   BILITOT 0.7 0.6  --   --   --   --   PROT 6.0* 5.6*  --   --   --   --   ALBUMIN 2.2* 2.1*   < > 2.1* 2.0* 2.1*   < > = values in this interval not displayed.    No results for input(s): LIPASE, AMYLASE in the last 168 hours. Recent Labs  Lab 06/02/21 1627  AMMONIA 15   CBC: Recent Labs  Lab 06/02/21 1627 06/03/21 0509 06/04/21 0628 06/08/21 0432  WBC 12.3* 13.0* 14.5* 10.2  NEUTROABS 8.2* 9.4*  --   --   HGB 11.0* 10.2* 10.5* 8.5*  HCT 35.9* 32.7* 34.2* 26.9*  MCV 92.1 91.3 92.9 91.2  PLT 222 201 207 112*   Cardiac Enzymes: Recent Labs  Lab 06/02/21 1912  CKTOTAL 19*   CBG: No results for input(s): GLUCAP in the last 168 hours.  Iron Studies: No results for input(s): IRON, TIBC, TRANSFERRIN, FERRITIN in the last 72 hours. Studies/Results: No results found.  amiodarone  200 mg  Oral QHS   atorvastatin  10 mg Oral QHS   bisacodyl  10 mg Oral Daily   carbidopa-levodopa  1 tablet Oral BID   Chlorhexidine Gluconate Cloth  6 each Topical Daily   divalproex  375 mg Oral BID   heparin  5,000 Units Subcutaneous Q8H   levothyroxine  112 mcg Oral Q0600   metoprolol tartrate  50 mg Oral BID   OLANZapine  15 mg Oral QHS   sodium chloride flush  3 mL Intravenous Q12H    BMET    Component Value Date/Time   NA 138 06/08/2021 0432   NA 137 06/08/2021 0432   NA 145 11/28/2014 0455   K 3.9 06/08/2021 0432   K 3.9 06/08/2021 0432   K 3.6 11/28/2014 0455   CL 110 06/08/2021 0432   CL 111 06/08/2021 0432   CL 117 (H) 11/28/2014 0455   CO2 18 (L) 06/08/2021 0432   CO2 18 (L) 06/08/2021 0432   CO2 26 11/28/2014 0455   GLUCOSE 77 06/08/2021 0432   GLUCOSE 77 06/08/2021 0432   GLUCOSE 81 11/28/2014 0455   BUN 99 (H) 06/08/2021 0432   BUN 100 (H) 06/08/2021 0432   BUN 17 11/28/2014 0455   CREATININE 9.37 (H) 06/08/2021 0432   CREATININE 9.40 (H) 06/08/2021 0432   CREATININE 1.17 11/28/2014 0455   CALCIUM 8.1 (L) 06/08/2021 0432   CALCIUM 8.0 (L) 06/08/2021 0432   CALCIUM 7.9 (L) 11/28/2014 0455   GFRNONAA 6 (L) 06/08/2021 0432   GFRNONAA 6 (L) 06/08/2021 0432   GFRNONAA >60 11/28/2014 0455   GFRAA 39 (L) 08/16/2019 1327    GFRAA >60 11/28/2014 0455   CBC    Component Value Date/Time   WBC 10.2 06/08/2021 0432   RBC 2.95 (L) 06/08/2021 0432   HGB 8.5 (L) 06/08/2021 0432   HGB 9.0 (L) 11/30/2014 0912   HCT 26.9 (L) 06/08/2021 0432   HCT 27.2 (L) 11/29/2014 0438   PLT 112 (L) 06/08/2021 0432   PLT 139 (L) 11/29/2014 0438   MCV 91.2 06/08/2021 0432   MCV 92 11/29/2014 0438   MCH 28.8 06/08/2021 0432   MCHC 31.6 06/08/2021 0432   RDW 15.3 06/08/2021 0432   RDW 15.0 (H) 11/29/2014 0438   LYMPHSABS 2.5 06/03/2021 0509   LYMPHSABS 0.9 (L) 11/29/2014 0438   MONOABS 0.7 06/03/2021 0509   MONOABS 0.7 11/29/2014 0438   EOSABS 0.1 06/03/2021 0509   EOSABS 0.2 11/29/2014 0438   BASOSABS 0.0 06/03/2021 0509   BASOSABS 0.0 11/29/2014 0438    Assessment/Plan:  AKI/CKD stage III, non-oliguric - unclear etiology.  If he was only started on IV abx 2 days prior to admission, not enough time to develop AIN (needs generally 7-10 days of antibiotics before developing AIN but it is unclear exactly what he was getting and for how long).  He was given CT with IV contrast on 03/22/21 and Scr was 1.9 at that time but never rechecked.  He has marked azotemia and CT scan without obstruction.  CK normal as well as C3 and C4.   Significant urine output after IVF's and BUN and Scr improving slightly. No urgent indication for dialysis at this time. Acute GN workup: negative ANCA, anti-GBM, normal complements but did have a markedly elevated ASO titer (although normal C3), and negative SPEP.  ANA pending.  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   Pt is not a suitable candidate for longterm dialysis given his poor functional, cognitive, and nutritional status.  Appreciate  palliative care consult to help set goals/limits of care.  Since his family could not be reached SW/CM contacted DSS who will evaluate Todd Page for guardianship (he is not competent to make decisions for himself). Hyperkalemia - resolved with IVFs and lokelma Parkinson's  disease - wheelchair bound, on sinemet Schizoaffective disorder - no documentation of lithium use. Atrial fibrillation - per primary Chronic diastolic CHF - torsemide on hold PNA/UTI - no evidence of PNA on CXR. Disposition - poor overall prognosis as above.   Madelon Lips MD [employer] [facility] [phone]

## 2021-06-08 NOTE — Progress Notes (Signed)
Daily Progress Note   Patient Name: Todd Page       Date: 06/08/2021 DOB: Oct 03, 1951  Age: 70 y.o. MRN#: [id number] Attending Physician: Roxan Hockey, MD Primary Care Physician: Hilbert Corrigan, MD Admit Date: 06/02/2021  Reason for Consultation/Follow-up: Establishing goals of care  Patient Profile/HPI:  70 y.o. male  with past medical history of vascular dementia, Parkinson's disease, a-fib, CKD III, schizoaffective disorder, paranoid schizophrenia, bipolar disorder, chronic diastolic hear failure admitted on 06/02/2021 with acute renal failure- workup for cause in progress- noted that his ASO was significantly elevated indicating possible glomerulonephritis s/p strep a infection? Other causes being considered are Invanz injury, myeloma (spep/upep are pending). Urine output is increasing- BUN is down, Cr continues to be elevated. Palliative medicine consulted for [place] as patient would not be a candidate for dialysis. Patient has exhibited cognitive deficits and displayed a lack of capacity for medical decision making. Family is unreachable. Social work contacted [place].  Subjective: Todd Page is awake and alert.  MMSE performed and he scored a 22 out of 30 indicating mild dementia. He showed mild deficits in orientation, attention and calculation, recall, moderate deficits in language.    The main determinant of capacity is cognition. Medical capacity refers to the ability to utilize information regarding a  proposed treatment option to make a choice that is congruent with one's own values and preferences.  There are four generally accepted decision-making abilities that constitute capacity: Understanding, expressing a choice, appreciation and reasoning.  Understanding, is to know the meaning  of the information given.  Individuals should be able to clearly communicate a choice when presented with multiple treatment options.  Individuals should be able to clearly communicate a choice when presented with multiple options and be able to express the reason for their choice and the consequences of those choices.   Appreciation is reflected in not just knowing the facts, but applying those facts to one's own life is essential to make the decision authentic.  The ability to appreciate facts refers to people's ability to recognize how facts are relevant to themselves, and how it affects them now and could affect them  in the future. The ability to reason is the ability to compare options and to infer consequences of choice.   Today Todd Page was able to tell me  in their own words information regarding some of their current diagnoses and its relation to this hospital stay.  However, he could not reason the "what if's" if his kidneys did not improve- or his decision related to his code status.   When discussing code status- he became too tearful and too anxious to verbalize his choices and reasoning and instead he became labile and he began discussing his family and the fact that his family "wants him to make them a lot of money"- he shares that he believes his family is expecting him to get well and go make a lot of money for them. He was not able to understand his chronic debilitated state. He was not able to reason that his family was not in contact with him and did not need him to make money.   In my opinion this patient does not have the capacity to make medical decisions for himself regarding his code status at the time of my interview- likely due to his multiple comorbidities and mental illnesses.    Capacity can wax and wane from one moment to another and can vary based on the decision being made.   Physical Exam Vitals and nursing note reviewed.  Neurological:     Mental Status: He is alert  and oriented to person, place, and time.  Psychiatric:        Mood and Affect: Mood is depressed. Affect is tearful.        Thought Content: Thought content is delusional.        Cognition and Memory: Cognition is impaired.            Vital Signs: BP (!) 153/82 (BP Location: Right Arm)   Pulse 66   Temp 98.2 F (36.8 C)   Resp 19   Ht '5\' 11"'$  (1.803 m)   Wt 99 kg   SpO2 98%   BMI 30.44 kg/m  SpO2: SpO2: 98 % O2 Device: O2 Device: Room Air O2 Flow Rate: O2 Flow Rate (L/min): 0 L/min  Intake/output summary:  Intake/Output Summary (Last 24 hours) at 06/08/2021 1220 Last data filed at 06/08/2021 0900 Gross per 24 hour  Intake 1080 ml  Output 300 ml  Net 780 ml   LBM: Last BM Date: 06/01/21 Baseline Weight: Weight: 91 kg Most recent weight: Weight: 99 kg       Palliative Assessment/Data: PPS: 50%      Patient Active Problem List   Diagnosis Date Noted  . Acute renal failure (ARF) ([place]) 06/02/2021  . Bedbound 06/02/2021  . Sepsis secondary to UTI ([place]) 05/12/2018  . Hyperlipidemia 02/06/2018  . Hypertensive urgency 02/05/2018  . Parkinson's disease ([place]) 02/05/2018  . Chronic diastolic CHF (congestive heart failure) ([place]) 02/05/2018  . 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 Hyperglycemia 03/19/2014  . CHF (congestive heart failure) ([place]) 03/19/2014  . Acute exacerbation of congestive heart failure ([place]) 03/18/2014  . Acute on chronic heart failure ([place]) 03/18/2014  . Chest pain 06/08/2013  . Tachypnea 05/25/2013  . Acute diastolic congestive heart failure ([place]) 05/25/2013  . Chronic kidney disease, stage III (moderate) (HCC) - baseline SCr 1.9 05/25/2013  . Acute gout 05/25/2013  . Elevated troponin 05/18/2013  . Acute respiratory failure ([place]) 02/20/2013  . Lower extremity edema 02/17/2013  . Acute on chronic systolic HF (heart failure) ([place]) 12/25/2012  . Atrial fibrillation with rapid ventricular response ([place]) 12/24/2012  . Malignant  hypertension 12/24/2012  . Dyspnea 12/24/2012  . Bilateral leg  pain 12/24/2012  . Hypokalemia 12/24/2012  . Noncompliance 09/21/2012  . Hypertension 09/21/2012  . Atrial fibrillation ([place]) 07/26/2012  . Obesity 07/26/2012  . Schizoaffective disorder ([place]) 07/26/2012    Palliative Care Assessment & Plan    Assessment/Recommendations/Plan  Continue current scope No immediate decision making needed- but does need long term advanced care planning  If his condition worsens or his renal function is determined that it will definitely not improve and dialysis is needed but patient is not a candidate (per notes dialysis is not indicated at this point)- and patient is at end of life- will attempt discussion again with patient, however, limits may need to be set by providers   Code Status: Full code  Prognosis:  Unable to determine  Discharge Planning: [facility] for rehab with Palliative care service follow-up  Thank you for allowing the Palliative Medicine Team to assist in the care of this patient.  Total time: 63 mins Prolonged billing: yes    Greater than 50%  of this time was spent counseling and coordinating care related to the above assessment and plan.  Mariana Kaufman, AGNP-C Palliative Medicine   Please contact Palliative Medicine Team phone at [phone] for questions and concerns.

## 2021-06-09 LAB — RENAL FUNCTION PANEL
Albumin: 2.1 g/dL — ABNORMAL LOW (ref 3.5–5.0)
Anion gap: 11 (ref 5–15)
BUN: 97 mg/dL — ABNORMAL HIGH (ref 8–23)
CO2: 16 mmol/L — ABNORMAL LOW (ref 22–32)
Calcium: 8.2 mg/dL — ABNORMAL LOW (ref 8.9–10.3)
Chloride: 111 mmol/L (ref 98–111)
Creatinine, Ser: 9.73 mg/dL — ABNORMAL HIGH (ref 0.61–1.24)
GFR, Estimated: 5 mL/min — ABNORMAL LOW (ref >60)
Glucose, Bld: 71 mg/dL (ref 70–99)
Phosphorus: 7.3 mg/dL — ABNORMAL HIGH (ref 2.5–4.6)
Potassium: 4.2 mmol/L (ref 3.5–5.1)
Sodium: 138 mmol/L (ref 135–145)

## 2021-06-09 NOTE — Progress Notes (Signed)
PROGRESS NOTE    Todd Page  [id number] DOB: 10/28/1951 DOA: 06/02/2021 PCP: Todd Corrigan, MD   Chief Complaint  Patient presents with   abnomal lab    Brief admission narrative:  As per H&P written by Dr. Bridgett Page on 06/02/2021 70 year old male with a history of Parkinson's disease, paroxysmal atrial fibrillation, CKD stage III with a baseline creatinine of 1.9, schizoaffective disorder, paranoid schizophrenia, vascular dementia, bipolar disorder, chronic diastolic heart failure who presents to the ER for acute renal failure.  According to the documentation sent from the nursing home, the patient was started on Invanz via midline on 05/31/2021 for diagnosis of pneumonia.  There is no chest x-ray report that was sent.  He also had a Foley catheter placed on 06/01/2021.  Labs were drawn today at the nursing home and the patient was noted to have a BUN of 115 with a creatinine of 7.5.  Potassium was elevated 6.0.  Bicarbonate was 20.  He was sent to the ER for further evaluation.   On repeat testing here, patient noted to have a potassium of 5.6, BUN of 117, creatinine of 8.5.   Urinalysis demonstrated large cassette esterase with negative nitrites.  Urine specific gravity 1.008.  White count elevated at 12.3.  Assessment & Plan: 1-hyperkalemia/acute on chronic renal failure (ARF) (HCC) -Stage IIIb at baseline -Unclear etiology at this moment; but with concern for nephrotoxic agent and use of contrast. -Nephrology service on board and assisting with work-up;  -Continue to closely follow renal function trend -Continue treatment of hyperkalemia using Lokelma and sodium bicarbonate; -Patient with Foley catheter in place (placed at skilled nursing facility prior to admission); CT not demonstrating hydronephrosis or obstruction.  Continue to demonstrate good amount of urine production. -Continue closely following the strict I's and O's. -Following renal service rec's, given that patient is not  a candidate for long-term hemodialysis; palliative care consultation has been recommended and will follow results of Todd Page discussion. Appreciate palliative care assistance and help, so far having difficulties contacting next of skin. 06/09/21--creatinine continues to trend up, overall prognosis is not encouraging  2-Atrial fibrillation ([place]) -Rate controlled -Prior to admission no using anticoagulation prior to admission. -Continue the use of amiodarone and metoprolol.  3-Schizoaffective disorder (HCC) -Mood overall stable -Continue supportive care, consult reorientation and nightly olanzapine.  4-Parkinson's disease ([place]) -Continue Sinemet. -Patient wheelchair-bound at baseline.  5-Chronic diastolic CHF (congestive heart failure) (HCC) -Stable and compensated currently -Continue to follow daily weights and strict I's and O's -continue Holding diuretics in the setting of worsening renal function at this time. -Continue beta-blocker.  6-essential hypertension -Continue to closely follow vital sign -Blood pressure fluctuating and continue to be slightly elevated -Will continue the use of PRN hydralazine. -Continue beta-blocker. -Avoid hypotension.  7)Social/Ethics--- Todd Page visit from [facility] appreciated -Palliative care visit from [facility] appreciated -Patient remains full code -Todd Page plans to apply for long-term guardianship (this may take a while) - Capacity eval by me on 06/07/21- Patient appears to have cognitive deficit r/t multiple chronic illnesses including Parkinson's, vascular dementia--- Todd Page is  a  70 y.o.  who is AAO x 3 who has a history of cognitive deficits/decline in the setting of vascular dementia, Parkinson's disease and multiple chronic comorbidities, who also has a past medical history of depression and schizoaffective disorder who is  Not able to fully understand (without significant language barrier) his current medical diagnosis, patient does Not fully  understand proposed treatment options including option of no  treatment, the patient does Not appear to fully understand consequences/risk Versus benefit of each treatment option, alternatives as well as the option of no treatment.  -Based on my evaluation Todd Page  appears to  Not have the Capacity to make decisions and give informed consent about his medical care.  -MMSE performed on 06/08/21 and he scored a 22 out of 30 indicating mild dementia -Ability to make decisions/capacity may vary from time to time as cognition varies A surrogate decision-maker is required as Todd Page  as it  appears that he does Not have capacity to make his own decisions and or give informed consent regarding his medical care at this time    DVT prophylaxis: Heparin Code Status: Full code. Family Communication: No family at bedside. Todd Page from [place] visited Disposition:   Status is: Inpatient  Remains inpatient appropriate because:IV treatments appropriate due to intensity of illness or inability to take PO  Dispo: The patient is from: SNF              Anticipated d/c is to: SNF              Patient currently is not medically stable to d/c.   Difficult to place patient No    Consultants:  Nephrology service Palliative care   Procedures:  See below for x-ray reports  Antimicrobials:  None   Subjective:  06/09/21--creatinine continues to trend up, overall prognosis is not encouraging -No vomiting or increased confusion or uremic type symptoms  Objective: Vitals:   06/08/21 2229 06/09/21 0500 06/09/21 0524 06/09/21 1307  BP: (!) 156/92  (!) 164/99 139/86  Pulse: (!) 105  100 99  Resp: 20   18  Temp: 98.6 F (37 C)  98.9 F (37.2 C) 97.8 F (36.6 C)  TempSrc: Oral   Oral  SpO2: 99%  100% 99%  Weight:  100.8 kg    Height:        Intake/Output Summary (Last 24 hours) at 06/09/2021 1928 Last data filed at 06/09/2021 1300 Gross per 24 hour  Intake 480 ml  Output 600 ml  Net -120 ml    Filed Weights   06/07/21 0500 06/08/21 0557 06/09/21 0500  Weight: 101 kg 99 kg 100.8 kg    Examination: General exam:  awake , in no apparent distress  Respiratory system: no wheezing, no crackles. Good air movement bilaterally Cardiovascular system:RRR. No rubs, no gallops, no JVD. Gastrointestinal system: Abdomen is nondistended, soft and nontender. . Normal bowel sounds heard. Extremities: No cyanosis, no clubbing.  Skin: No petechiae. NeuroPsychiatry: Wheelchair-bound at baseline due to Parkinson's, no new additional focal neuro deficits, baseline cognitive and memory deficits noted GU--scrotal swelling noted, Foley in situ  Data Reviewed: I have personally reviewed following labs and imaging studies  CBC: Recent Labs  Lab 06/03/21 0509 06/04/21 0628 06/08/21 0432  WBC 13.0* 14.5* 10.2  NEUTROABS 9.4*  --   --   HGB 10.2* 10.5* 8.5*  HCT 32.7* 34.2* 26.9*  MCV 91.3 92.9 91.2  PLT 201 207 112*    Basic Metabolic Panel: Recent Labs  Lab 06/03/21 0509 06/03/21 0911 06/05/21 0151 06/06/21 0635 06/07/21 0427 06/08/21 0432 06/09/21 0521  NA 135   < > 137 135 137 138  137 138  K 6.0*   < > 5.0 4.1 4.2 3.9  3.9 4.2  CL 109   < > 108 109 110 110  111 111  CO2 21*   < > 21* 18* 18*  18*  18* 16*  GLUCOSE 78   < > 79 80 83 77  77 71  BUN 120*   < > 103* 104* 102* 99*  100* 97*  CREATININE 8.42*   < > 8.61* 9.33* 8.99* 9.37*  9.40* 9.73*  CALCIUM 7.5*   < > 7.8* 7.5* 7.9* 8.1*  8.0* 8.2*  MG 3.0*  --   --   --   --   --   --   PHOS  --    < > 6.3* 7.0* 7.5* 7.4* 7.3*   < > = values in this interval not displayed.    GFR: Estimated Creatinine Clearance: 8.7 mL/min (A) (by C-G formula based on SCr of 9.73 mg/dL (H)).  Liver Function Tests: Recent Labs  Lab 06/03/21 0509 06/03/21 0911 06/05/21 0151 06/06/21 [id number] 06/07/21 0427 06/08/21 0432 06/09/21 0521  AST 8*  --   --   --   --   --   --   ALT <5  --   --   --   --   --   --   ALKPHOS 48  --    --   --   --   --   --   BILITOT 0.6  --   --   --   --   --   --   PROT 5.6*  --   --   --   --   --   --   ALBUMIN 2.1*   < > 2.0* 2.1* 2.0* 2.1* 2.1*   < > = values in this interval not displayed.    CBG: Recent Labs  Lab 06/08/21 1559  GLUCAP 90     Recent Results (from the past 240 hour(s))  Urine Culture     Status: None   Collection Time: 06/02/21  6:04 PM   Specimen: Urine, Clean Catch  Result Value Ref Range Status   Specimen Description   Final    URINE, CLEAN CATCH Performed at [facility], [address] [place], [place] [address]    Special Requests   Final    NONE Performed at [facility], [address], [place], [place] [address]    Culture   Final    NO GROWTH Performed at [facility], [place] [address], [place], [place] [address]    Report Status 06/04/2021 FINAL  Final  Resp Panel by RT-PCR (Flu A&B, Covid) Nasopharyngeal Swab     Status: None   Collection Time: 06/02/21  7:08 PM   Specimen: Nasopharyngeal Swab; Nasopharyngeal(NP) swabs in vial transport medium  Result Value Ref Range Status   SARS Coronavirus 2 by RT PCR NEGATIVE NEGATIVE Final    Comment: (NOTE) SARS-CoV-2 target nucleic acids are NOT DETECTED.  The SARS-CoV-2 RNA is generally detectable in upper respiratory specimens during the acute phase of infection. The lowest concentration of SARS-CoV-2 viral copies this assay can detect is 138 copies/mL. A negative result does not preclude SARS-Cov-2 infection and should not be used as the sole basis for treatment or other patient management decisions. A negative result may occur with  improper specimen collection/handling, submission of specimen other than nasopharyngeal swab, presence of viral mutation(s) within the areas targeted by this assay, and inadequate number of viral copies(<138 copies/mL). A negative result must be combined with clinical observations, patient history, and epidemiological information. The expected result  is Negative.  Fact Sheet for Patients:  EntrepreneurPulse.com.au  Fact Sheet for Healthcare Providers:  IncredibleEmployment.be  This test is no t yet approved or cleared by the [place] and  has been authorized for detection and/or diagnosis of SARS-CoV-2 by FDA under an Emergency Use Authorization (EUA). This EUA will remain  in effect (meaning this test can be used) for the duration of the COVID-19 declaration under Section 564(b)(1) of the Act, 21 U.S.C.section 360bbb-3(b)(1), unless the authorization is terminated  or revoked sooner.       Influenza A by PCR NEGATIVE NEGATIVE Final   Influenza B by PCR NEGATIVE NEGATIVE Final    Comment: (NOTE) The Xpert Xpress SARS-CoV-2/FLU/RSV plus assay is intended as an aid in the diagnosis of influenza from Nasopharyngeal swab specimens and should not be used as a sole basis for treatment. Nasal washings and aspirates are unacceptable for Xpert Xpress SARS-CoV-2/FLU/RSV testing.  Fact Sheet for Patients: EntrepreneurPulse.com.au  Fact Sheet for Healthcare Providers: IncredibleEmployment.be  This test is not yet approved or cleared by the [place] FDA and has been authorized for detection and/or diagnosis of SARS-CoV-2 by FDA under an Emergency Use Authorization (EUA). This EUA will remain in effect (meaning this test can be used) for the duration of the COVID-19 declaration under Section 564(b)(1) of the Act, 21 U.S.C. section 360bbb-3(b)(1), unless the authorization is terminated or revoked.  Performed at [facility], [address], [place], [place] [address]      Radiology Studies: No results found.   Scheduled Meds:  amiodarone  200 mg Oral QHS   atorvastatin  10 mg Oral QHS   bisacodyl  10 mg Oral Daily   carbidopa-levodopa  1 tablet Oral BID   Chlorhexidine Gluconate Cloth  6 each Topical Daily   divalproex  375 mg Oral BID    heparin  5,000 Units Subcutaneous Q8H   levothyroxine  112 mcg Oral Q0600   metoprolol tartrate  50 mg Oral BID   OLANZapine  15 mg Oral QHS   sodium chloride flush  3 mL Intravenous Q12H   Continuous Infusions:   LOS: 7 days   Roxan Hockey, MD [name]   To contact the attending provider between 7A-7P or the covering provider during after hours 7P-7A, please log into the web site [email or web address] and access using universal [place] password for that web site. If you do not have the password, please call the hospital operator.  06/09/2021, 7:28 PM

## 2021-06-09 NOTE — Progress Notes (Signed)
Patient ID: Todd Page, male   DOB: 08/01/1951, 70 y.o.   MRN: [id number]   S: Palliative care saw yesterday- no capacity.  Appreciate assistance.  Sleeping and easily arousable today.  No real improvement at all in Cr.    O:BP (!) 164/99   Pulse 100   Temp 98.9 F (37.2 C)   Resp 20   Ht '5\' 11"'$  (1.803 m)   Wt 100.8 kg   SpO2 100%   BMI 30.99 kg/m   Intake/Output Summary (Last 24 hours) at 06/09/2021 1034 Last data filed at 06/09/2021 0900 Gross per 24 hour  Intake 480 ml  Output 600 ml  Net -120 ml   Intake/Output: I/O last 3 completed shifts: In: 720 [P.O.:720] Out: 900 [Urine:900]   Intake/Output this shift:  Total I/O In: 240 [P.O.:240] Out: -  Weight change: 1.8 kg  Gen:NAD, sleeping but easily arousable CVS:RRR Resp: CTA anteriorly, shallow breaths Abd:+BS, soft, NT/ND Ext: no LE edema, some slight flank edema Neuro: + little bit of asterixis today  Recent Labs  Lab 06/02/21 1627 06/03/21 0509 06/03/21 0911 06/04/21 0628 06/05/21 0151 06/06/21 0635 06/07/21 0427 06/08/21 0432 06/09/21 0521  NA 133* 135 135 136 137 135 137 138  137 138  K 5.6* 6.0* 6.0* 5.2* 5.0 4.1 4.2 3.9  3.9 4.2  CL 106 109 109 106 108 109 110 110  111 111  CO2 21* 21* 19* 20* 21* 18* 18* 18*  18* 16*  GLUCOSE 95 78 71 72 79 80 83 77  77 71  BUN 117* 120* 119* 110* 103* 104* 102* 99*  100* 97*  CREATININE 8.54* 8.42* 8.49* 8.37* 8.61* 9.33* 8.99* 9.37*  9.40* 9.73*  ALBUMIN 2.2* 2.1* 2.1* 2.3* 2.0* 2.1* 2.0* 2.1* 2.1*  CALCIUM 7.6* 7.5* 7.4* 8.0* 7.8* 7.5* 7.9* 8.1*  8.0* 8.2*  PHOS  --   --  6.2* 6.0* 6.3* 7.0* 7.5* 7.4* 7.3*  AST 9* 8*  --   --   --   --   --   --   --   ALT <5 <5  --   --   --   --   --   --   --    Liver Function Tests: Recent Labs  Lab 06/02/21 1627 06/03/21 0509 06/03/21 0911 06/07/21 0427 06/08/21 0432 06/09/21 0521  AST 9* 8*  --   --   --   --   ALT <5 <5  --   --   --   --   ALKPHOS 58 48  --   --   --   --   BILITOT 0.7 0.6  --   --    --   --   PROT 6.0* 5.6*  --   --   --   --   ALBUMIN 2.2* 2.1*   < > 2.0* 2.1* 2.1*   < > = values in this interval not displayed.   No results for input(s): LIPASE, AMYLASE in the last 168 hours. Recent Labs  Lab 06/02/21 1627  AMMONIA 15   CBC: Recent Labs  Lab 06/02/21 1627 06/03/21 0509 06/04/21 0628 06/08/21 0432  WBC 12.3* 13.0* 14.5* 10.2  NEUTROABS 8.2* 9.4*  --   --   HGB 11.0* 10.2* 10.5* 8.5*  HCT 35.9* 32.7* 34.2* 26.9*  MCV 92.1 91.3 92.9 91.2  PLT 222 201 207 112*   Cardiac Enzymes: Recent Labs  Lab 06/02/21 1912  CKTOTAL 19*   CBG: Recent Labs  Lab 06/08/21 1559  GLUCAP 90    Iron Studies: No results for input(s): IRON, TIBC, TRANSFERRIN, FERRITIN in the last 72 hours. Studies/Results: No results found.  amiodarone  200 mg Oral QHS   atorvastatin  10 mg Oral QHS   bisacodyl  10 mg Oral Daily   carbidopa-levodopa  1 tablet Oral BID   Chlorhexidine Gluconate Cloth  6 each Topical Daily   divalproex  375 mg Oral BID   heparin  5,000 Units Subcutaneous Q8H   levothyroxine  112 mcg Oral Q0600   metoprolol tartrate  50 mg Oral BID   OLANZapine  15 mg Oral QHS   sodium chloride flush  3 mL Intravenous Q12H    BMET    Component Value Date/Time   NA 138 06/09/2021 0521   NA 145 11/28/2014 0455   K 4.2 06/09/2021 0521   K 3.6 11/28/2014 0455   CL 111 06/09/2021 0521   CL 117 (H) 11/28/2014 0455   CO2 16 (L) 06/09/2021 0521   CO2 26 11/28/2014 0455   GLUCOSE 71 06/09/2021 0521   GLUCOSE 81 11/28/2014 0455   BUN 97 (H) 06/09/2021 0521   BUN 17 11/28/2014 0455   CREATININE 9.73 (H) 06/09/2021 0521   CREATININE 1.17 11/28/2014 0455   CALCIUM 8.2 (L) 06/09/2021 0521   CALCIUM 7.9 (L) 11/28/2014 0455   GFRNONAA 5 (L) 06/09/2021 0521   GFRNONAA >60 11/28/2014 0455   GFRAA 39 (L) 08/16/2019 1327   GFRAA >60 11/28/2014 0455   CBC    Component Value Date/Time   WBC 10.2 06/08/2021 0432   RBC 2.95 (L) 06/08/2021 0432   HGB 8.5 (L)  06/08/2021 0432   HGB 9.0 (L) 11/30/2014 0912   HCT 26.9 (L) 06/08/2021 0432   HCT 27.2 (L) 11/29/2014 0438   PLT 112 (L) 06/08/2021 0432   PLT 139 (L) 11/29/2014 0438   MCV 91.2 06/08/2021 0432   MCV 92 11/29/2014 0438   MCH 28.8 06/08/2021 0432   MCHC 31.6 06/08/2021 0432   RDW 15.3 06/08/2021 0432   RDW 15.0 (H) 11/29/2014 0438   LYMPHSABS 2.5 06/03/2021 0509   LYMPHSABS 0.9 (L) 11/29/2014 0438   MONOABS 0.7 06/03/2021 0509   MONOABS 0.7 11/29/2014 0438   EOSABS 0.1 06/03/2021 0509   EOSABS 0.2 11/29/2014 0438   BASOSABS 0.0 06/03/2021 0509   BASOSABS 0.0 11/29/2014 0438    Assessment/Plan:  AKI/CKD stage III, non-oliguric - unclear etiology.  If he was only started on IV abx 2 days prior to admission, not enough time to develop AIN (needs generally 7-10 days of antibiotics before developing AIN but it is unclear exactly what he was getting and for how long).  He was given CT with IV contrast on 03/22/21 and Scr was 1.9 at that time but never rechecked.  He has marked azotemia and CT scan without obstruction.  CK normal as well as C3 and C4.   Significant urine output after IVF's. Pt appears volume replete- and overall Cr not improving.  Starting to develop asterixis.  I don't have a lot to reverse here- and he is not a dialysis candidate.  If trajectory continues he will eventually become uremic.     No urgent indication for dialysis at this time. Acute GN workup: negative ANCA, anti-GBM, normal complements but did have a markedly elevated ASO titer (although normal C3), and negative SPEP.  ANA negative as well as dsDNA.  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   Pt is not a  suitable candidate for longterm dialysis given his poor functional, cognitive, and nutritional status.  Appreciate palliative care consult to help set goals/limits of care.  I think hospice is reasonable here- if he improves eventually, those services can be discontinued.  If he doesn't, then he will have that layer of  symptomatic support. Hyperkalemia - resolved with IVFs and lokelma Parkinson's disease - wheelchair bound, on sinemet Schizoaffective disorder - no documentation of lithium use. Atrial fibrillation - per primary Chronic diastolic CHF - torsemide on hold PNA/UTI - no evidence of PNA on CXR. Disposition - poor overall prognosis as above.   Madelon Lips MD [employer] [facility] [phone]

## 2021-06-09 NOTE — Care Management Important Message (Signed)
Important Message  Patient Details  Name: Todd Page MRN: [id number] Date of Birth: 1950/12/01   Medicare Important Message Given:  Yes     [name] 06/09/2021, 11:22 AM

## 2021-06-10 LAB — RENAL FUNCTION PANEL
Albumin: 2.1 g/dL — ABNORMAL LOW (ref 3.5–5.0)
Anion gap: 10 (ref 5–15)
BUN: 96 mg/dL — ABNORMAL HIGH (ref 8–23)
CO2: 18 mmol/L — ABNORMAL LOW (ref 22–32)
Calcium: 8.2 mg/dL — ABNORMAL LOW (ref 8.9–10.3)
Chloride: 112 mmol/L — ABNORMAL HIGH (ref 98–111)
Creatinine, Ser: 9.94 mg/dL — ABNORMAL HIGH (ref 0.61–1.24)
GFR, Estimated: 5 mL/min — ABNORMAL LOW (ref >60)
Glucose, Bld: 72 mg/dL (ref 70–99)
Phosphorus: 7.2 mg/dL — ABNORMAL HIGH (ref 2.5–4.6)
Potassium: 4.4 mmol/L (ref 3.5–5.1)
Sodium: 140 mmol/L (ref 135–145)

## 2021-06-10 LAB — CBC
HCT: 25.9 % — ABNORMAL LOW (ref 39.0–52.0)
Hemoglobin: 7.9 g/dL — ABNORMAL LOW (ref 13.0–17.0)
MCH: 28.4 pg (ref 26.0–34.0)
MCHC: 30.5 g/dL (ref 30.0–36.0)
MCV: 93.2 fL (ref 80.0–100.0)
Platelets: 129 10*3/uL — ABNORMAL LOW (ref 150–400)
RBC: 2.78 MIL/uL — ABNORMAL LOW (ref 4.22–5.81)
RDW: 15 % (ref 11.5–15.5)
WBC: 9.4 10*3/uL (ref 4.0–10.5)
nRBC: 0 % (ref 0.0–0.2)

## 2021-06-10 MED ORDER — AMLODIPINE BESYLATE 5 MG PO TABS
5.0000 mg | ORAL_TABLET | Freq: Every day | ORAL | Status: DC
  Administered 2021-06-10 – 2021-06-13 (×4): 5 mg via ORAL
  Filled 2021-06-10 (×4): qty 1

## 2021-06-10 MED ORDER — LABETALOL HCL 5 MG/ML IV SOLN
10.0000 mg | INTRAVENOUS | Status: DC | PRN
  Administered 2021-06-10 – 2021-06-11 (×2): 10 mg via INTRAVENOUS
  Filled 2021-06-10 (×2): qty 4

## 2021-06-10 NOTE — Progress Notes (Signed)
PROGRESS NOTE    Todd Page  [id number] DOB: September 12, 1951 DOA: 06/02/2021 PCP: Hilbert Corrigan, MD   Chief Complaint  Patient presents with   abnomal lab    Brief admission narrative:  As per H&P written by Dr. Bridgett Larsson on 06/02/2021 70 year old male with a history of Parkinson's disease, paroxysmal atrial fibrillation, CKD stage III with a baseline creatinine of 1.9, schizoaffective disorder, paranoid schizophrenia, vascular dementia, bipolar disorder, chronic diastolic heart failure who presents to the ER for acute renal failure.  According to the documentation sent from the nursing home, the patient was started on Invanz via midline on 05/31/2021 for diagnosis of pneumonia.  There is no chest x-ray report that was sent.  He also had a Foley catheter placed on 06/01/2021.  Labs were drawn today at the nursing home and the patient was noted to have a BUN of 115 with a creatinine of 7.5.  Potassium was elevated 6.0.  Bicarbonate was 20.  He was sent to the ER for further evaluation.   On repeat testing here, patient noted to have a potassium of 5.6, BUN of 117, creatinine of 8.5.   Urinalysis demonstrated large cassette esterase with negative nitrites.  Urine specific gravity 1.008.  White count elevated at 12.3.  06/10/20 -Creatinine close to 10, BUN close to 100, not a candidate for dialysis -Overall prognosis is poor -Awaiting further delineation of decision maker as pt does not appear to have capacity to make informed decisions -Patient maybe more appropriate for palliative/hospice care  Assessment & Plan: 1-hyperkalemia/acute on chronic renal failure (ARF) (HCC) -Stage IIIb at baseline -Unclear etiology at this moment; but with concern for nephrotoxic agent and use of contrast. -Nephrology service on board and assisting with work-up;  -Continue to closely follow renal function trend -Continue treatment of hyperkalemia using Lokelma and sodium bicarbonate; -Patient with Foley catheter  in place (placed at skilled nursing facility prior to admission); CT not demonstrating hydronephrosis or obstruction.  Continue to demonstrate good amount of urine production. -Continue closely following the strict I's and O's. -Following renal service rec's, given that patient is not a candidate for long-term hemodialysis; palliative care consultation has been recommended and will follow results of [place] discussion. Appreciate palliative care assistance and help, so far having difficulties contacting next of skin. 06/10/21--creatinine continues to trend up, overall prognosis is not encouraging  2-Atrial fibrillation ([place]) -Rate controlled -Prior to admission no using anticoagulation prior to admission. -Continue the use of amiodarone and metoprolol.  3-Schizoaffective disorder (HCC) -Mood overall stable -Continue supportive care, consult reorientation and nightly olanzapine.  4-Parkinson's disease ([place]) -Continue Sinemet. -Patient wheelchair-bound at baseline.  5-Chronic diastolic CHF (congestive heart failure) (HCC) -Stable and compensated currently -Continue to follow daily weights and strict I's and O's -continue Holding diuretics in the setting of worsening renal function at this time. -Continue metoprolol 50 twice daily  6-essential hypertension -Add amlodipine 5 mg daily - continue the use of PRN hydralazine. -Continue beta-blocker.   7)Social/Ethics--- DSS visit from [facility] appreciated -Palliative care visit from [facility] appreciated -Patient remains full code -DSS plans to apply for long-term guardianship (this may take a while) - Capacity eval by me on 06/07/21- Patient appears to have cognitive deficit r/t multiple chronic illnesses including Parkinson's, vascular dementia--- Todd Page is  a  70 y.o.  who is AAO x 3 who has a history of cognitive deficits/decline in the setting of vascular dementia, Parkinson's disease and multiple chronic comorbidities, who also has a  past medical history of depression and schizoaffective disorder who is  Not able to fully understand (without significant language barrier) his current medical diagnosis, patient does Not fully understand proposed treatment options including option of no treatment, the patient does Not appear to fully understand consequences/risk Versus benefit of each treatment option, alternatives as well as the option of no treatment.  -Based on my evaluation Todd Page  appears to  Not have the Capacity to make decisions and give informed consent about his medical care.  -MMSE performed on 06/08/21 and he scored a 22 out of 30 indicating mild dementia -Ability to make decisions/capacity may vary from time to time as cognition varies A surrogate decision-maker is required as [name]  as it  appears that he does Not have capacity to make his own decisions and or give informed consent regarding his medical care at this time    DVT prophylaxis: Heparin Code Status: Full code. Family Communication: No family at bedside. Melissa from [facility] visited Disposition:   Status is: Inpatient  Remains inpatient appropriate because:IV treatments appropriate due to intensity of illness or inability to take PO  Dispo: The patient is from: SNF              Anticipated d/c is to: SNF              Patient currently is not medically stable to d/c.   Difficult to place patient No    Consultants:  Nephrology service Palliative care   Procedures:  See below for x-ray reports  Antimicrobials:  None   Subjective:  06/10/21--creatinine continues to trend up, overall prognosis is not encouraging -No vomiting or increased confusion or uremic type symptoms  Objective: Vitals:   06/10/21 0557 06/10/21 0842 06/10/21 0842 06/10/21 1409  BP: (!) 143/82 (!) 153/103  (!) 170/107  Pulse: 69 (!) 210 93 93  Resp: 18   20  Temp: 97.7 F (36.5 C)   98.4 F (36.9 C)  TempSrc: Oral     SpO2: 97% 96% 98% 99%  Weight:       Height:        Intake/Output Summary (Last 24 hours) at 06/10/2021 1913 Last data filed at 06/10/2021 1831 Gross per 24 hour  Intake 840 ml  Output 2800 ml  Net -1960 ml   Filed Weights   06/07/21 0500 06/08/21 0557 06/09/21 0500  Weight: 101 kg 99 kg 100.8 kg    Examination: General exam:  awake , in no apparent distress  Respiratory system: no wheezing, no crackles. Good air movement bilaterally Cardiovascular system:RRR. No rubs, no gallops, no JVD. Gastrointestinal system: Abdomen is nondistended, soft and nontender. . Normal bowel sounds heard. Extremities: No cyanosis, no clubbing.  Skin: No petechiae. NeuroPsychiatry: Wheelchair-bound at baseline due to Parkinson's, no new additional focal neuro deficits, baseline cognitive and memory deficits noted GU--scrotal swelling noted, Foley in situ  Data Reviewed: I have personally reviewed following labs and imaging studies  CBC: Recent Labs  Lab 06/04/21 0628 06/08/21 0432 06/10/21 0625  WBC 14.5* 10.2 9.4  HGB 10.5* 8.5* 7.9*  HCT 34.2* 26.9* 25.9*  MCV 92.9 91.2 93.2  PLT 207 112* 129*    Basic Metabolic Panel: Recent Labs  Lab 06/06/21 0635 06/07/21 0427 06/08/21 0432 06/09/21 0521 06/10/21 0625  NA 135 137 138  137 138 140  K 4.1 4.2 3.9  3.9 4.2 4.4  CL 109 110 110  111 111 112*  CO2 18* 18* 18*  18* 16* 18*  GLUCOSE 80 83 77  77 71 72  BUN 104* 102* 99*  100* 97* 96*  CREATININE 9.33* 8.99* 9.37*  9.40* 9.73* 9.94*  CALCIUM 7.5* 7.9* 8.1*  8.0* 8.2* 8.2*  PHOS 7.0* 7.5* 7.4* 7.3* 7.2*    GFR: Estimated Creatinine Clearance: 8.5 mL/min (A) (by C-G formula based on SCr of 9.94 mg/dL (H)).  Liver Function Tests: Recent Labs  Lab 06/06/21 0635 06/07/21 0427 06/08/21 0432 06/09/21 0521 06/10/21 0625  ALBUMIN 2.1* 2.0* 2.1* 2.1* 2.1*    CBG: Recent Labs  Lab 06/08/21 1559  GLUCAP 90     Recent Results (from the past 240 hour(s))  Urine Culture     Status: None   Collection  Time: 06/02/21  6:04 PM   Specimen: Urine, Clean Catch  Result Value Ref Range Status   Specimen Description   Final    URINE, CLEAN CATCH Performed at [facility], [address], [place], [place] [address]    Special Requests   Final    NONE Performed at [facility], [address], [place], [place] [address]    Culture   Final    NO GROWTH Performed at [facility], [place] [address], [place], [place] [address]    Report Status 06/04/2021 FINAL  Final  Resp Panel by RT-PCR (Flu A&B, Covid) Nasopharyngeal Swab     Status: None   Collection Time: 06/02/21  7:08 PM   Specimen: Nasopharyngeal Swab; Nasopharyngeal(NP) swabs in vial transport medium  Result Value Ref Range Status   SARS Coronavirus 2 by RT PCR NEGATIVE NEGATIVE Final    Comment: (NOTE) SARS-CoV-2 target nucleic acids are NOT DETECTED.  The SARS-CoV-2 RNA is generally detectable in upper respiratory specimens during the acute phase of infection. The lowest concentration of SARS-CoV-2 viral copies this assay can detect is 138 copies/mL. A negative result does not preclude SARS-Cov-2 infection and should not be used as the sole basis for treatment or other patient management decisions. A negative result may occur with  improper specimen collection/handling, submission of specimen other than nasopharyngeal swab, presence of viral mutation(s) within the areas targeted by this assay, and inadequate number of viral copies(<138 copies/mL). A negative result must be combined with clinical observations, patient history, and epidemiological information. The expected result is Negative.  Fact Sheet for Patients:  EntrepreneurPulse.com.au  Fact Sheet for Healthcare Providers:  IncredibleEmployment.be  This test is no t yet approved or cleared by the [place] FDA and  has been authorized for detection and/or diagnosis of SARS-CoV-2 by FDA under an Emergency Use Authorization  (EUA). This EUA will remain  in effect (meaning this test can be used) for the duration of the COVID-19 declaration under Section 564(b)(1) of the Act, 21 U.S.C.section 360bbb-3(b)(1), unless the authorization is terminated  or revoked sooner.       Influenza A by PCR NEGATIVE NEGATIVE Final   Influenza B by PCR NEGATIVE NEGATIVE Final    Comment: (NOTE) The Xpert Xpress SARS-CoV-2/FLU/RSV plus assay is intended as an aid in the diagnosis of influenza from Nasopharyngeal swab specimens and should not be used as a sole basis for treatment. Nasal washings and aspirates are unacceptable for Xpert Xpress SARS-CoV-2/FLU/RSV testing.  Fact Sheet for Patients: EntrepreneurPulse.com.au  Fact Sheet for Healthcare Providers: IncredibleEmployment.be  This test is not yet approved or cleared by the [place] FDA and has been authorized for detection and/or diagnosis of SARS-CoV-2 by FDA under an Emergency Use Authorization (  EUA). This EUA will remain in effect (meaning this test can be used) for the duration of the COVID-19 declaration under Section 564(b)(1) of the Act, 21 U.S.C. section 360bbb-3(b)(1), unless the authorization is terminated or revoked.  Performed at [facility], [address] [place], [place] [address]      Radiology Studies: No results found.   Scheduled Meds:  amiodarone  200 mg Oral QHS   amLODipine  5 mg Oral Daily   atorvastatin  10 mg Oral QHS   bisacodyl  10 mg Oral Daily   carbidopa-levodopa  1 tablet Oral BID   Chlorhexidine Gluconate Cloth  6 each Topical Daily   divalproex  375 mg Oral BID   heparin  5,000 Units Subcutaneous Q8H   levothyroxine  112 mcg Oral Q0600   metoprolol tartrate  50 mg Oral BID   OLANZapine  15 mg Oral QHS   sodium chloride flush  3 mL Intravenous Q12H   Continuous Infusions:   LOS: 8 days   Roxan Hockey, MD [name]   To contact the attending provider between  7A-7P or the covering provider during after hours 7P-7A, please log into the web site [email or web address] and access using universal [place] password for that web site. If you do not have the password, please call the hospital operator.  06/10/2021, 7:13 PM

## 2021-06-10 NOTE — Plan of Care (Signed)
  Problem: Education: Goal: Knowledge of General Education information will improve Description Including pain rating scale, medication(s)/side effects and non-pharmacologic comfort measures Outcome: Progressing   Problem: Health Behavior/Discharge Planning: Goal: Ability to manage health-related needs will improve Outcome: Progressing   

## 2021-06-11 LAB — RENAL FUNCTION PANEL
Albumin: 2.1 g/dL — ABNORMAL LOW (ref 3.5–5.0)
Anion gap: 9 (ref 5–15)
BUN: 92 mg/dL — ABNORMAL HIGH (ref 8–23)
CO2: 15 mmol/L — ABNORMAL LOW (ref 22–32)
Calcium: 7.7 mg/dL — ABNORMAL LOW (ref 8.9–10.3)
Chloride: 113 mmol/L — ABNORMAL HIGH (ref 98–111)
Creatinine, Ser: 10 mg/dL — ABNORMAL HIGH (ref 0.61–1.24)
GFR, Estimated: 5 mL/min — ABNORMAL LOW
Glucose, Bld: 74 mg/dL (ref 70–99)
Phosphorus: 7.3 mg/dL — ABNORMAL HIGH (ref 2.5–4.6)
Potassium: 4.5 mmol/L (ref 3.5–5.1)
Sodium: 137 mmol/L (ref 135–145)

## 2021-06-11 MED ORDER — SODIUM BICARBONATE 650 MG PO TABS
650.0000 mg | ORAL_TABLET | Freq: Three times a day (TID) | ORAL | Status: DC
  Administered 2021-06-11 – 2021-06-13 (×7): 650 mg via ORAL
  Filled 2021-06-11 (×7): qty 1

## 2021-06-11 MED ORDER — NEPRO/CARBSTEADY PO LIQD
237.0000 mL | Freq: Two times a day (BID) | ORAL | Status: DC
  Administered 2021-06-11 – 2021-06-13 (×5): 237 mL via ORAL

## 2021-06-11 NOTE — Progress Notes (Signed)
PROGRESS NOTE    Todd Page  [id number] DOB: 02/12/1951 DOA: 06/02/2021 PCP: Hilbert Corrigan, MD   Chief Complaint  Patient presents with   abnomal lab    Brief admission narrative:  As per H&P written by Dr. Bridgett Larsson on 06/02/2021 70 year old male with a history of Parkinson's disease, paroxysmal atrial fibrillation, CKD stage III with a baseline creatinine of 1.9, schizoaffective disorder, paranoid schizophrenia, vascular dementia, bipolar disorder, chronic diastolic heart failure who presents to the ER for acute renal failure.  According to the documentation sent from the nursing home, the patient was started on Invanz via midline on 05/31/2021 for diagnosis of pneumonia.  There is no chest x-ray report that was sent.  He also had a Foley catheter placed on 06/01/2021.  Labs were drawn today at the nursing home and the patient was noted to have a BUN of 115 with a creatinine of 7.5.  Potassium was elevated 6.0.  Bicarbonate was 20.  He was sent to the ER for further evaluation.   On repeat testing here, patient noted to have a potassium of 5.6, BUN of 117, creatinine of 8.5.   Urinalysis demonstrated large cassette esterase with negative nitrites.  Urine specific gravity 1.008.  White count elevated at 12.3.  06/11/21 -Creatinine close to 10, BUN close to 90, not a candidate for dialysis -Overall prognosis is poor -Awaiting further delineation of Goal of care once decision maker is established as pt does not appear to have capacity to make informed decisions -Patient maybe more appropriate for palliative/hospice care  Assessment & Plan: 1-hyperkalemia/acute on chronic renal failure (ARF) (HCC) -Stage IIIb at baseline -Unclear etiology at this moment; but with concern for nephrotoxic agent and use of contrast. -Nephrology consult and input appreciated Patient with Foley catheter in place (placed at skilled nursing facility prior to admission);  --CT not demonstrating hydronephrosis or  obstruction.  -Urine output is adequate --Continue closely following the strict I's and O's. -Following renal service rec's-- patient is not a candidate for long-term hemodialysis;  06/11/21--creatinine continues to trend up, overall prognosis is not encouraging  2-Atrial fibrillation ([place]) -Patient was not on anticoagulation PTA  -Continue amiodarone and metoprolol for rate control  3-Schizoaffective disorder ([place]) -Mood overall stable -Continue supportive care, c/n  reorientation and nightly olanzapine.  4-Parkinson's disease ([place]) -Continue Sinemet. -Patient wheelchair-bound at baseline.  5-Chronic diastolic CHF (congestive heart failure) (HCC) -Stable and compensated currently -continue Holding diuretics in the setting of worsening renal function at this time. -Continue metoprolol 50 twice daily  6-essential hypertension -c/n amlodipine 5 mg daily and metoprolol 50 twice daily -May use IV labetalol as needed elevated BP  7)Social/Ethics--- DSS visit from [facility] appreciated -Palliative care consult appreciated -Patient remains full code -DSS plans to apply for long-term guardianship (this may take a while) - Capacity eval by me on 06/07/21- Patient appears to have cognitive deficit r/t multiple chronic illnesses including kidney disease, schizoaffective disorder, Parkinson's and presumed vascular dementia--- Todd Page is  a  70 y.o.  who is AAO x 3 who has a history of cognitive deficits/decline in the setting of vascular dementia, Parkinson's disease and multiple chronic comorbidities, who also has a past medical history of depression and schizoaffective disorder who is  Not able to fully understand (without significant language barrier) his current medical diagnosis, patient does Not fully understand proposed treatment options including option of no treatment, the patient does Not appear to fully understand consequences/risk Versus benefit of each treatment option, alternatives  as well as the option of no treatment.  -Based on my evaluation Todd Page  appears to  Not have the Capacity to make decisions and give informed consent about his medical care.  -MMSE performed on 06/08/21 and he scored a 22 out of 30 indicating mild dementia -Ability to make decisions/capacity may vary from time to time as cognition varies A surrogate decision-maker is required as Todd Page  as it  appears that he does Not have capacity to make his own decisions and or give informed consent regarding his medical care at this time  8) risk for hypoglycemia--- borderline low sugars noted, give Magic cup and supplements  - 9) hypothyroidism--- continue levothyroxine 112 mcg daily  DVT prophylaxis: Heparin Code Status: Full code. Family Communication: No family at bedside. Melissa from [place] visited Disposition:   Status is: Inpatient  Remains inpatient appropriate because:IV treatments appropriate due to intensity of illness or inability to take PO  Dispo: The patient is from: SNF              Anticipated d/c is to: SNF              Patient currently is not medically stable to d/c.   Difficult to place patient No    Consultants:  Nephrology service Palliative care   Procedures:  See below for x-ray reports  Antimicrobials:  None   Subjective:  06/11/21--creatinine continues to trend up, overall prognosis is not encouraging -No vomiting or increased confusion or uremic type symptoms -Borderline low sugars noted  Objective: Vitals:   06/10/21 2147 06/11/21 0626 06/11/21 0829 06/11/21 0829  BP: (!) 153/100 (!) 156/97 (!) 137/97   Pulse: 99 69 (!) 111 98  Resp: 18 18    Temp: 98.6 F (37 C)     TempSrc: Oral     SpO2: 96% 96% 98% 98%  Weight:  97.4 kg    Height:        Intake/Output Summary (Last 24 hours) at 06/11/2021 1332 Last data filed at 06/11/2021 0900 Gross per 24 hour  Intake 600 ml  Output 1400 ml  Net -800 ml   Filed Weights   06/08/21 0557 06/09/21  0500 06/11/21 0626  Weight: 99 kg 100.8 kg 97.4 kg    Examination: General exam:  awake , in no apparent distress  Respiratory system: no wheezing, no crackles. Good air movement bilaterally Cardiovascular system:RRR. No rubs, no gallops, no JVD. Gastrointestinal system: Abdomen is nondistended, soft and nontender. . Normal bowel sounds heard. Extremities: No cyanosis, no clubbing.  Skin: No petechiae. NeuroPsychiatry: Wheelchair-bound at baseline due to Parkinson's, no new additional focal neuro deficits, baseline cognitive and memory deficits noted GU--scrotal swelling noted, Foley in situ  Data Reviewed: I have personally reviewed following labs and imaging studies  CBC: Recent Labs  Lab 06/08/21 0432 06/10/21 0625  WBC 10.2 9.4  HGB 8.5* 7.9*  HCT 26.9* 25.9*  MCV 91.2 93.2  PLT 112* 129*    Basic Metabolic Panel: Recent Labs  Lab 06/07/21 0427 06/08/21 0432 06/09/21 0521 06/10/21 0625 06/11/21 0450  NA 137 138  137 138 140 137  K 4.2 3.9  3.9 4.2 4.4 4.5  CL 110 110  111 111 112* 113*  CO2 18* 18*  18* 16* 18* 15*  GLUCOSE 83 77  77 71 72 74  BUN 102* 99*  100* 97* 96* 92*  CREATININE 8.99* 9.37*  9.40* 9.73* 9.94* 10.00*  CALCIUM 7.9* 8.1*  8.0*  8.2* 8.2* 7.7*  PHOS 7.5* 7.4* 7.3* 7.2* 7.3*    GFR: Estimated Creatinine Clearance: 8.3 mL/min (A) (by C-G formula based on SCr of 10 mg/dL (H)).  Liver Function Tests: Recent Labs  Lab 06/07/21 0427 06/08/21 0432 06/09/21 0521 06/10/21 0625 06/11/21 0450  ALBUMIN 2.0* 2.1* 2.1* 2.1* 2.1*    CBG: Recent Labs  Lab 06/08/21 1559  GLUCAP 90     Recent Results (from the past 240 hour(s))  Urine Culture     Status: None   Collection Time: 06/02/21  6:04 PM   Specimen: Urine, Clean Catch  Result Value Ref Range Status   Specimen Description   Final    URINE, CLEAN CATCH Performed at [facility], [address] [place], [place] [address]    Special Requests   Final    NONE Performed at  [facility], [address], [place], [place] [address]    Culture   Final    NO GROWTH Performed at [facility], [place] [address] [place], [place] [address]    Report Status 06/04/2021 FINAL  Final  Resp Panel by RT-PCR (Flu A&B, Covid) Nasopharyngeal Swab     Status: None   Collection Time: 06/02/21  7:08 PM   Specimen: Nasopharyngeal Swab; Nasopharyngeal(NP) swabs in vial transport medium  Result Value Ref Range Status   SARS Coronavirus 2 by RT PCR NEGATIVE NEGATIVE Final    Comment: (NOTE) SARS-CoV-2 target nucleic acids are NOT DETECTED.  The SARS-CoV-2 RNA is generally detectable in upper respiratory specimens during the acute phase of infection. The lowest concentration of SARS-CoV-2 viral copies this assay can detect is 138 copies/mL. A negative result does not preclude SARS-Cov-2 infection and should not be used as the sole basis for treatment or other patient management decisions. A negative result may occur with  improper specimen collection/handling, submission of specimen other than nasopharyngeal swab, presence of viral mutation(s) within the areas targeted by this assay, and inadequate number of viral copies(<138 copies/mL). A negative result must be combined with clinical observations, patient history, and epidemiological information. The expected result is Negative.  Fact Sheet for Patients:  EntrepreneurPulse.com.au  Fact Sheet for Healthcare Providers:  IncredibleEmployment.be  This test is no t yet approved or cleared by the [place] FDA and  has been authorized for detection and/or diagnosis of SARS-CoV-2 by FDA under an Emergency Use Authorization (EUA). This EUA will remain  in effect (meaning this test can be used) for the duration of the COVID-19 declaration under Section 564(b)(1) of the Act, 21 U.S.C.section 360bbb-3(b)(1), unless the authorization is terminated  or revoked sooner.       Influenza A  by PCR NEGATIVE NEGATIVE Final   Influenza B by PCR NEGATIVE NEGATIVE Final    Comment: (NOTE) The Xpert Xpress SARS-CoV-2/FLU/RSV plus assay is intended as an aid in the diagnosis of influenza from Nasopharyngeal swab specimens and should not be used as a sole basis for treatment. Nasal washings and aspirates are unacceptable for Xpert Xpress SARS-CoV-2/FLU/RSV testing.  Fact Sheet for Patients: EntrepreneurPulse.com.au  Fact Sheet for Healthcare Providers: IncredibleEmployment.be  This test is not yet approved or cleared by the [place] FDA and has been authorized for detection and/or diagnosis of SARS-CoV-2 by FDA under an Emergency Use Authorization (EUA). This EUA will remain in effect (meaning this test can be used) for the duration of the COVID-19 declaration under Section 564(b)(1) of the Act, 21 U.S.C. section 360bbb-3(b)(1), unless the authorization is terminated or  revoked.  Performed at [facility], [address] [place], [place] [address]      Radiology Studies: No results found.   Scheduled Meds:  amiodarone  200 mg Oral QHS   amLODipine  5 mg Oral Daily   atorvastatin  10 mg Oral QHS   bisacodyl  10 mg Oral Daily   carbidopa-levodopa  1 tablet Oral BID   Chlorhexidine Gluconate Cloth  6 each Topical Daily   divalproex  375 mg Oral BID   heparin  5,000 Units Subcutaneous Q8H   levothyroxine  112 mcg Oral Q0600   metoprolol tartrate  50 mg Oral BID   OLANZapine  15 mg Oral QHS   sodium chloride flush  3 mL Intravenous Q12H   Continuous Infusions:   LOS: 9 days   Roxan Hockey, MD [name]  To contact the attending provider between 7A-7P or the covering provider during after hours 7P-7A, please log into the web site [email or web address] and access using universal [place] password for that web site. If you do not have the password, please call the hospital operator.  06/11/2021, 1:32 PM

## 2021-06-12 LAB — RENAL FUNCTION PANEL
Albumin: 2.3 g/dL — ABNORMAL LOW (ref 3.5–5.0)
Anion gap: 4 — ABNORMAL LOW (ref 5–15)
BUN: 95 mg/dL — ABNORMAL HIGH (ref 8–23)
CO2: 21 mmol/L — ABNORMAL LOW (ref 22–32)
Calcium: 7.9 mg/dL — ABNORMAL LOW (ref 8.9–10.3)
Chloride: 113 mmol/L — ABNORMAL HIGH (ref 98–111)
Creatinine, Ser: 10.21 mg/dL — ABNORMAL HIGH (ref 0.61–1.24)
GFR, Estimated: 5 mL/min — ABNORMAL LOW (ref >60)
Glucose, Bld: 81 mg/dL (ref 70–99)
Phosphorus: 7.1 mg/dL — ABNORMAL HIGH (ref 2.5–4.6)
Potassium: 4.2 mmol/L (ref 3.5–5.1)
Sodium: 138 mmol/L (ref 135–145)

## 2021-06-12 LAB — CBC
HCT: 24.1 % — ABNORMAL LOW (ref 39.0–52.0)
Hemoglobin: 7.9 g/dL — ABNORMAL LOW (ref 13.0–17.0)
MCH: 30.3 pg (ref 26.0–34.0)
MCHC: 32.8 g/dL (ref 30.0–36.0)
MCV: 92.3 fL (ref 80.0–100.0)
Platelets: 158 10*3/uL (ref 150–400)
RBC: 2.61 MIL/uL — ABNORMAL LOW (ref 4.22–5.81)
RDW: 14.9 % (ref 11.5–15.5)
WBC: 9.7 10*3/uL (ref 4.0–10.5)
nRBC: 0 % (ref 0.0–0.2)

## 2021-06-12 MED ORDER — DARBEPOETIN ALFA 200 MCG/0.4ML IJ SOSY
200.0000 ug | PREFILLED_SYRINGE | INTRAMUSCULAR | Status: DC
  Administered 2021-06-12: 200 ug via SUBCUTANEOUS
  Filled 2021-06-12: qty 0.4

## 2021-06-12 NOTE — Care Management Important Message (Signed)
Important Message  Patient Details  Name: Todd Page MRN: [id number] Date of Birth: 1951-06-08   Medicare Important Message Given:  Yes     [name] 06/12/2021, 3:09 PM

## 2021-06-12 NOTE — Progress Notes (Signed)
Patient ID: Todd Page, male   DOB: 14-Jun-1951, 70 y.o.   MRN: [id number]   S: Nonoliguric-  BUN and crt the same.  Tells me that he threw up yesterday but was still able to eat some "When can I go back to Todd Page ?"  O:BP (!) 170/90 (BP Location: Left Arm)   Pulse 87   Temp 98.6 F (37 C)   Resp 18   Ht '5\' 11"'$  (1.803 m)   Wt 97.4 kg   SpO2 99%   BMI 29.95 kg/m   Intake/Output Summary (Last 24 hours) at 06/12/2021 0749 Last data filed at 06/12/2021 0530 Gross per 24 hour  Intake 1197 ml  Output 1500 ml  Net -303 ml   Intake/Output: I/O last 3 completed shifts: In: 1197 [P.O.:1197] Out: 2100 [Urine:2100]   Intake/Output this shift:  No intake/output data recorded. Weight change:   Gen:NAD, conversive  CVS:RRR Resp: CTA anteriorly, shallow breaths Abd:+BS, soft, NT/ND  Ext: some dep edema  Recent Labs  Lab 06/06/21 0635 06/07/21 0427 06/08/21 0432 06/09/21 0521 06/10/21 0625 06/11/21 0450  NA 135 137 138  137 138 140 137  K 4.1 4.2 3.9  3.9 4.2 4.4 4.5  CL 109 110 110  111 111 112* 113*  CO2 18* 18* 18*  18* 16* 18* 15*  GLUCOSE 80 83 77  77 71 72 74  BUN 104* 102* 99*  100* 97* 96* 92*  CREATININE 9.33* 8.99* 9.37*  9.40* 9.73* 9.94* 10.00*  ALBUMIN 2.1* 2.0* 2.1* 2.1* 2.1* 2.1*  CALCIUM 7.5* 7.9* 8.1*  8.0* 8.2* 8.2* 7.7*  PHOS 7.0* 7.5* 7.4* 7.3* 7.2* 7.3*   Liver Function Tests: Recent Labs  Lab 06/09/21 0521 06/10/21 0625 06/11/21 0450  ALBUMIN 2.1* 2.1* 2.1*   No results for input(s): LIPASE, AMYLASE in the last 168 hours. No results for input(s): AMMONIA in the last 168 hours.  CBC: Recent Labs  Lab 06/08/21 0432 06/10/21 0625  WBC 10.2 9.4  HGB 8.5* 7.9*  HCT 26.9* 25.9*  MCV 91.2 93.2  PLT 112* 129*   Cardiac Enzymes: No results for input(s): CKTOTAL, CKMB, CKMBINDEX, TROPONINI in the last 168 hours.  CBG: Recent Labs  Lab 06/08/21 1559  GLUCAP 90    Iron Studies: No results for input(s): IRON, TIBC, TRANSFERRIN,  FERRITIN in the last 72 hours. Studies/Results: No results found.  amiodarone  200 mg Oral QHS   amLODipine  5 mg Oral Daily   atorvastatin  10 mg Oral QHS   bisacodyl  10 mg Oral Daily   carbidopa-levodopa  1 tablet Oral BID   Chlorhexidine Gluconate Cloth  6 each Topical Daily   divalproex  375 mg Oral BID   feeding supplement (NEPRO CARB STEADY)  237 mL Oral BID   heparin  5,000 Units Subcutaneous Q8H   levothyroxine  112 mcg Oral Q0600   metoprolol tartrate  50 mg Oral BID   OLANZapine  15 mg Oral QHS   sodium bicarbonate  650 mg Oral TID   sodium chloride flush  3 mL Intravenous Q12H    BMET    Component Value Date/Time   NA 137 06/11/2021 0450   NA 145 11/28/2014 0455   K 4.5 06/11/2021 0450   K 3.6 11/28/2014 0455   CL 113 (H) 06/11/2021 0450   CL 117 (H) 11/28/2014 0455   CO2 15 (L) 06/11/2021 0450   CO2 26 11/28/2014 0455   GLUCOSE 74 06/11/2021 0450   GLUCOSE 81  11/28/2014 0455   BUN 92 (H) 06/11/2021 0450   BUN 17 11/28/2014 0455   CREATININE 10.00 (H) 06/11/2021 0450   CREATININE 1.17 11/28/2014 0455   CALCIUM 7.7 (L) 06/11/2021 0450   CALCIUM 7.9 (L) 11/28/2014 0455   GFRNONAA 5 (L) 06/11/2021 0450   GFRNONAA >60 11/28/2014 0455   GFRAA 39 (L) 08/16/2019 1327   GFRAA >60 11/28/2014 0455   CBC    Component Value Date/Time   WBC 9.4 06/10/2021 0625   RBC 2.78 (L) 06/10/2021 0625   HGB 7.9 (L) 06/10/2021 0625   HGB 9.0 (L) 11/30/2014 0912   HCT 25.9 (L) 06/10/2021 0625   HCT 27.2 (L) 11/29/2014 0438   PLT 129 (L) 06/10/2021 0625   PLT 139 (L) 11/29/2014 0438   MCV 93.2 06/10/2021 0625   MCV 92 11/29/2014 0438   MCH 28.4 06/10/2021 0625   MCHC 30.5 06/10/2021 0625   RDW 15.0 06/10/2021 0625   RDW 15.0 (H) 11/29/2014 0438   LYMPHSABS 2.5 06/03/2021 0509   LYMPHSABS 0.9 (L) 11/29/2014 0438   MONOABS 0.7 06/03/2021 0509   MONOABS 0.7 11/29/2014 0438   EOSABS 0.1 06/03/2021 0509   EOSABS 0.2 11/29/2014 0438   BASOSABS 0.0 06/03/2021 0509    BASOSABS 0.0 11/29/2014 0438    Assessment/Plan:  AKI/CKD stage III, non-oliguric - unclear etiology.   He was given CT with IV contrast on 03/22/21 and Scr was 1.9 at that time but never rechecked.  He has marked azotemia and CT scan without obstruction.  CK normal as well as C3 and C4.   Nonoliguric but BUN and Scr not improving significantly but also not worsening-  maye trying to plateau ?? No urgent indication for dialysis at this time. Acute GN workup: negative ANCA, anti-GBM, normal complements but did have a markedly elevated ASO titer (although normal C3), and negative SPEP.  ANA negative.  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   Pt is not a suitable candidate for longterm dialysis given his poor functional, cognitive, and nutritional status.  Appreciate palliative care consult to help set goals/limits of care.  Since his family could not be reached SW/CM contacted DSS who will evaluate Todd Page for guardianship (he is not competent to make decisions for himself). Hyperkalemia - resolved Parkinson's disease - wheelchair bound at baseline,  on sinemet Schizoaffective disorder - no documentation of lithium use. Anemia-  will give ESA and check iron stores Chronic diastolic CHF - torsemide on hold-  on norvasc/metoprolol with still highish BPs-   no change for now PNA/UTI - no evidence of PNA on CXR. Metabolic acidosis-  on oral bicarb    Todd Page  Todd Page

## 2021-06-12 NOTE — TOC Progression Note (Signed)
Transition of Care [facility]) - Progression Note    Patient Details  Name: Todd Page MRN: [id number] Date of Birth: 07/01/1951  [facility] CM/SW Contact  Salome Arnt, [place] Phone Number: 06/12/2021, 2:03 PM  Clinical Narrative:  LCSW attempted to reach pt's brother Elta [place], but number is not working. LCSW spoke with Hildred Alamin, Education officer, museum at [employer] who reports pt's family told them not to call them anymore unless he had died. Per DSS, [facility] was supposed to start guardianship process. However, Hildred Alamin indicates they are not able to do that because pt is not currently in their facility. MD updated. LCSW involved in ethics committee discussion. MD requesting consult to hospice to follow at SNF. Ref[facility] to [facility]. LCSW left voicemail for [place] with update.      Expected Discharge Plan: Long Term Nursing Home Barriers to Discharge: Continued Medical Work up  Expected Discharge Plan and Services Expected Discharge Plan: [place]       Living arrangements for the past 2 months: [place]                                       Social Determinants of Health (SDOH) Interventions    Readmission Risk Interventions Readmission Risk Prevention Plan 06/05/2021  Transportation Screening Complete  Medication Review [employer]) Complete  PCP or Specialist appointment within 3-5 days of discharge Complete  HRI or Home Care Consult Complete  SW Recovery Care/Counseling Consult Complete  Palliative Care Screening Complete  Skilled Nursing Facility Complete  Some recent data might be hidden

## 2021-06-12 NOTE — Progress Notes (Signed)
PROGRESS NOTE    Todd Page  [id number] DOB: [birth year] DOA: 06/02/2021 PCP: Hilbert Corrigan, MD   Chief Complaint  Patient presents with   abnomal lab    Brief admission narrative:  As per H&P written by Dr. Bridgett Larsson on 06/02/2021 70 year old male with a history of Parkinson's disease, paroxysmal atrial fibrillation, CKD stage III with a baseline creatinine of 1.9, schizoaffective disorder, paranoid schizophrenia, vascular dementia, bipolar disorder, chronic diastolic heart failure who presents to the ER for acute renal failure.  According to the documentation sent from the nursing home, the patient was started on Invanz via midline on 05/31/2021 for diagnosis of pneumonia.  There is no chest x-ray report that was sent.  He also had a Foley catheter placed on 06/01/2021.  Labs were drawn today at the nursing home and the patient was noted to have a BUN of 115 with a creatinine of 7.5.  Potassium was elevated 6.0.  Bicarbonate was 20.  He was sent to the ER for further evaluation.   On repeat testing here, patient noted to have a potassium of 5.6, BUN of 117, creatinine of 8.5.   Urinalysis demonstrated large cassette esterase with negative nitrites.  Urine specific gravity 1.008.  White count elevated at 12.3.  06/12/21 -Creatinine > 10, BUN > 90, not a candidate for dialysis -Overall prognosis is poor -Awaiting further delineation of Goal of care once decision maker is established as pt does not appear to have capacity to make informed decisions -Patient maybe more appropriate for palliative/hospice care  Assessment & Plan: 1-acute on chronic renal failure (ARF)  -Stage IIIb at baseline -He was given CT with IV contrast on 03/22/21 and Scr was 1.9 at that time but never rechecked.  He has marked azotemia and CT scan without obstruction.  CK normal as well as C3 and C4.   --Nonoliguric but BUN and Scr not improving (BUN > 90 and Cr > 10) -Acute GN workup: negative ANCA, anti-GBM, normal  complements but did have a markedly elevated ASO titer (although normal C3), and negative SPEP.  ANA negative.  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  -Per Nephrology Team --Pt is Not a suitable candidate for longterm dialysis given his poor functional, cognitive, and nutritional status.  -Nephrology consult and input appreciated Patient with Foley catheter in place (placed at skilled nursing facility prior to admission);  06/12/21--creatinine continues to trend up, overall prognosis is not encouraging  2-Atrial fibrillation ([place]) -Patient was not on anticoagulation PTA  -Continue amiodarone and metoprolol for rate control  3-Schizoaffective disorder ([place]) -Mood overall stable -Continue supportive care, c/n  reorientation and nightly olanzapine.  4-Parkinson's disease ([place]) -Continue Sinemet. -Patient wheelchair-bound at baseline.  5-Chronic diastolic CHF (congestive heart failure) (HCC) -Stable and compensated currently -continue Holding diuretics in the setting of worsening renal function at this time. -Continue metoprolol 50 twice daily  6-essential hypertension -c/n amlodipine 5 mg daily and metoprolol 50 twice daily -May use IV labetalol as needed elevated BP  7)Social/Ethics--- DSS visit from [facility] appreciated -Palliative care consult appreciated -Patient remains full code -DSS plans to apply for long-term guardianship (this may take a while) - Capacity eval by me on 06/07/21- Patient appears to have cognitive deficit r/t multiple chronic illnesses including kidney disease, schizoaffective disorder, Parkinson's and presumed vascular dementia--- Todd Page is  a  70 y.o.  who is AAO x 3 who has a history of cognitive deficits/decline in the setting of vascular dementia, Parkinson's disease and multiple chronic comorbidities,  who also has a past medical history of depression and schizoaffective disorder who is  Not able to fully understand (without significant language barrier) his  current medical diagnosis, patient does Not fully understand proposed treatment options including option of no treatment, the patient does Not appear to fully understand consequences/risk Versus benefit of each treatment option, alternatives as well as the option of no treatment.  -Based on my evaluation Todd Page  appears to  Not have the Capacity to make decisions and give informed consent about his medical care.  -MMSE performed on 06/08/21 and he scored a 22 out of 30 indicating mild dementia -Ability to make decisions/capacity may vary from time to time as cognition varies A surrogate decision-maker is required as [name]  as it  appears that he does Not have capacity to make his own decisions and or give informed consent regarding his medical care at this time  8) risk for hypoglycemia--- episodes of borderline low sugars noted, give Magic cup and nutritional supplements  - 9) hypothyroidism--- continue levothyroxine 112 mcg daily  DVT prophylaxis: Heparin Code Status: Full code. Family Communication: No family at bedside. Melissa from [place] visited Disposition:   Status is: Inpatient  Remains inpatient appropriate because:IV treatments appropriate due to intensity of illness or inability to take PO  Dispo: The patient is from: SNF              Anticipated d/c is to: SNF [facility] with [facility]              Patient currently is not medically stable to d/c.   Difficult to place patient No    Consultants:  Nephrology service Palliative care  Ethics committee  Procedures:  See below for x-ray reports  Antimicrobials:  None   Subjective:  06/12/21--creatinine continues to trend up, overall prognosis is not encouraging .No fever  Or chills  -Oral intake is fair  -Urine output is adequate  Objective: Vitals:   06/11/21 1800 06/11/21 2201 06/12/21 0511 06/12/21 0935  BP: (!) 182/108 (!) 157/98 (!) 170/90 (!) 149/90  Pulse: (!) 103 100 87 98   Resp: '20 20 18   '$ Temp:  98.8 F (37.1 C) 98.6 F (37 C)   TempSrc:      SpO2:  95% 99%   Weight:      Height:        Intake/Output Summary (Last 24 hours) at 06/12/2021 1240 Last data filed at 06/12/2021 0900 Gross per 24 hour  Intake 1077 ml  Output 1500 ml  Net -423 ml   Filed Weights   06/08/21 0557 06/09/21 0500 06/11/21 0626  Weight: 99 kg 100.8 kg 97.4 kg    Examination: General exam:  awake , in no apparent distress  Respiratory system: no wheezing, no crackles. Good air movement bilaterally Cardiovascular system:RRR. No rubs, no gallops, no JVD. Gastrointestinal system: Abdomen is nondistended, soft and nontender. . Normal bowel sounds heard. Extremities: No cyanosis, no clubbing.  Skin: No petechiae. NeuroPsychiatry: Wheelchair-bound at baseline due to Parkinson's, no new additional focal neuro deficits, baseline cognitive and memory deficits noted GU--scrotal swelling noted, Foley in situ  Data Reviewed: I have personally reviewed following labs and imaging studies  CBC: Recent Labs  Lab 06/08/21 0432 06/10/21 0625 06/12/21 0726  WBC 10.2 9.4 9.7  HGB 8.5* 7.9* 7.9*  HCT 26.9* 25.9* 24.1*  MCV 91.2 93.2 92.3  PLT 112* 129* 0000000    Basic Metabolic Panel: Recent  Labs  Lab 06/08/21 0432 06/09/21 0521 06/10/21 0625 06/11/21 0450 06/12/21 0726  NA 138  137 138 140 137 138  K 3.9  3.9 4.2 4.4 4.5 4.2  CL 110  111 111 112* 113* 113*  CO2 18*  18* 16* 18* 15* 21*  GLUCOSE 77  77 71 72 74 81  BUN 99*  100* 97* 96* 92* 95*  CREATININE 9.37*  9.40* 9.73* 9.94* 10.00* 10.21*  CALCIUM 8.1*  8.0* 8.2* 8.2* 7.7* 7.9*  PHOS 7.4* 7.3* 7.2* 7.3* 7.1*    GFR: Estimated Creatinine Clearance: 8.1 mL/min (A) (by C-G formula based on SCr of 10.21 mg/dL (H)).  Liver Function Tests: Recent Labs  Lab 06/08/21 0432 06/09/21 0521 06/10/21 0625 06/11/21 0450 06/12/21 0726  ALBUMIN 2.1* 2.1* 2.1* 2.1* 2.3*    CBG: Recent Labs  Lab 06/08/21 1559   GLUCAP 90     Recent Results (from the past 240 hour(s))  Urine Culture     Status: None   Collection Time: 06/02/21  6:04 PM   Specimen: Urine, Clean Catch  Result Value Ref Range Status   Specimen Description   Final    URINE, CLEAN CATCH Performed at [facility], [address], [place], [place] [address]    Special Requests   Final    NONE Performed at [facility], [address], [place], [place] [address]    Culture   Final    NO GROWTH Performed at [facility], [place] [address], [place], [place] [address]    Report Status 06/04/2021 FINAL  Final  Resp Panel by RT-PCR (Flu A&B, Covid) Nasopharyngeal Swab     Status: None   Collection Time: 06/02/21  7:08 PM   Specimen: Nasopharyngeal Swab; Nasopharyngeal(NP) swabs in vial transport medium  Result Value Ref Range Status   SARS Coronavirus 2 by RT PCR NEGATIVE NEGATIVE Final    Comment: (NOTE) SARS-CoV-2 target nucleic acids are NOT DETECTED.  The SARS-CoV-2 RNA is generally detectable in upper respiratory specimens during the acute phase of infection. The lowest concentration of SARS-CoV-2 viral copies this assay can detect is 138 copies/mL. A negative result does not preclude SARS-Cov-2 infection and should not be used as the sole basis for treatment or other patient management decisions. A negative result may occur with  improper specimen collection/handling, submission of specimen other than nasopharyngeal swab, presence of viral mutation(s) within the areas targeted by this assay, and inadequate number of viral copies(<138 copies/mL). A negative result must be combined with clinical observations, patient history, and epidemiological information. The expected result is Negative.  Fact Sheet for Patients:  EntrepreneurPulse.com.au  Fact Sheet for Healthcare Providers:  IncredibleEmployment.be  This test is no t yet approved or cleared by the [place] FDA and  has  been authorized for detection and/or diagnosis of SARS-CoV-2 by FDA under an Emergency Use Authorization (EUA). This EUA will remain  in effect (meaning this test can be used) for the duration of the COVID-19 declaration under Section 564(b)(1) of the Act, 21 U.S.C.section 360bbb-3(b)(1), unless the authorization is terminated  or revoked sooner.       Influenza A by PCR NEGATIVE NEGATIVE Final   Influenza B by PCR NEGATIVE NEGATIVE Final    Comment: (NOTE) The Xpert Xpress SARS-CoV-2/FLU/RSV plus assay is intended as an aid in the diagnosis of influenza from Nasopharyngeal swab specimens and should not be used as a sole basis for treatment. Nasal washings and aspirates are unacceptable for Xpert Xpress  SARS-CoV-2/FLU/RSV testing.  Fact Sheet for Patients: EntrepreneurPulse.com.au  Fact Sheet for Healthcare Providers: IncredibleEmployment.be  This test is not yet approved or cleared by the [place] FDA and has been authorized for detection and/or diagnosis of SARS-CoV-2 by FDA under an Emergency Use Authorization (EUA). This EUA will remain in effect (meaning this test can be used) for the duration of the COVID-19 declaration under Section 564(b)(1) of the Act, 21 U.S.C. section 360bbb-3(b)(1), unless the authorization is terminated or revoked.  Performed at [facility], [address], [place], [place] [address]      Radiology Studies: No results found.   Scheduled Meds:  amiodarone  200 mg Oral QHS   amLODipine  5 mg Oral Daily   atorvastatin  10 mg Oral QHS   bisacodyl  10 mg Oral Daily   carbidopa-levodopa  1 tablet Oral BID   Chlorhexidine Gluconate Cloth  6 each Topical Daily   darbepoetin (ARANESP) injection - NON-DIALYSIS  200 mcg Subcutaneous Q Mon-1800   divalproex  375 mg Oral BID   feeding supplement (NEPRO CARB STEADY)  237 mL Oral BID   heparin  5,000 Units Subcutaneous Q8H   levothyroxine  112 mcg Oral Q0600    metoprolol tartrate  50 mg Oral BID   OLANZapine  15 mg Oral QHS   sodium bicarbonate  650 mg Oral TID   sodium chloride flush  3 mL Intravenous Q12H   Continuous Infusions:   LOS: 10 days   Roxan Hockey, MD [name]  To contact the attending provider between 7A-7P or the covering provider during after hours 7P-7A, please log into the web site [email or web address] and access using universal  password for that web site. If you do not have the password, please call the hospital operator.  06/12/2021, 12:40 PM

## 2021-06-13 LAB — BASIC METABOLIC PANEL
Anion gap: 6 (ref 5–15)
BUN: 94 mg/dL — ABNORMAL HIGH (ref 8–23)
CO2: 19 mmol/L — ABNORMAL LOW (ref 22–32)
Calcium: 7.8 mg/dL — ABNORMAL LOW (ref 8.9–10.3)
Chloride: 110 mmol/L (ref 98–111)
Creatinine, Ser: 10.59 mg/dL — ABNORMAL HIGH (ref 0.61–1.24)
GFR, Estimated: 5 mL/min — ABNORMAL LOW (ref >60)
Glucose, Bld: 81 mg/dL (ref 70–99)
Potassium: 4.4 mmol/L (ref 3.5–5.1)
Sodium: 135 mmol/L (ref 135–145)

## 2021-06-13 LAB — CBC
HCT: 23 % — ABNORMAL LOW (ref 39.0–52.0)
Hemoglobin: 7.2 g/dL — ABNORMAL LOW (ref 13.0–17.0)
MCH: 29.4 pg (ref 26.0–34.0)
MCHC: 31.3 g/dL (ref 30.0–36.0)
MCV: 93.9 fL (ref 80.0–100.0)
Platelets: 154 10*3/uL (ref 150–400)
RBC: 2.45 MIL/uL — ABNORMAL LOW (ref 4.22–5.81)
RDW: 14.9 % (ref 11.5–15.5)
WBC: 8.5 10*3/uL (ref 4.0–10.5)
nRBC: 0 % (ref 0.0–0.2)

## 2021-06-13 LAB — IRON AND TIBC
Iron: 42 ug/dL — ABNORMAL LOW (ref 45–182)
Saturation Ratios: 22 % (ref 17.9–39.5)
TIBC: 188 ug/dL — ABNORMAL LOW (ref 250–450)
UIBC: 146 ug/dL

## 2021-06-13 LAB — FERRITIN: Ferritin: 456 ng/mL — ABNORMAL HIGH (ref 24–336)

## 2021-06-13 MED ORDER — SODIUM BICARBONATE 650 MG PO TABS: 650.0000 mg | ORAL_TABLET | Freq: Three times a day (TID) | ORAL | 3 refills | Status: DC

## 2021-06-13 MED ORDER — SODIUM CHLORIDE 0.9 % IV SOLN
250.0000 mg | Freq: Every day | INTRAVENOUS | Status: DC
  Administered 2021-06-13: 250 mg via INTRAVENOUS
  Filled 2021-06-13 (×2): qty 20

## 2021-06-13 MED ORDER — NEPRO/CARBSTEADY PO LIQD: 237.0000 mL | Freq: Two times a day (BID) | ORAL | 5 refills | Status: DC

## 2021-06-13 MED ORDER — FERROUS SULFATE 325 (65 FE) MG PO TBEC: 325.0000 mg | DELAYED_RELEASE_TABLET | Freq: Two times a day (BID) | ORAL | 3 refills | Status: DC

## 2021-06-13 MED ORDER — SENNOSIDES-DOCUSATE SODIUM 8.6-50 MG PO TABS: 2.0000 | ORAL_TABLET | Freq: Every day | ORAL | 11 refills | Status: AC

## 2021-06-13 MED ORDER — LORAZEPAM 0.5 MG PO TABS: 0.5000 mg | ORAL_TABLET | Freq: Two times a day (BID) | ORAL | 0 refills | Status: DC | PRN

## 2021-06-13 NOTE — Progress Notes (Signed)
Patient ID: Todd Page, male   DOB: February 19, 1951, 70 y.o.   MRN: [id number]   S: No urine output recorded-  crt rising slowly-  no family members ID'd-  now trying for guardianship-  hospice and SNF placement.  Pleasant-  eating breakfast, denies nausea today  O:BP 140/87 (BP Location: Right Arm)   Pulse 88   Temp 97.7 F (36.5 C) (Oral)   Resp 19   Ht '5\' 11"'$  (1.803 m)   Wt 97.4 kg   SpO2 100%   BMI 29.95 kg/m   Intake/Output Summary (Last 24 hours) at 06/13/2021 0837 Last data filed at 06/12/2021 1300 Gross per 24 hour  Intake 480 ml  Output --  Net 480 ml   Intake/Output: I/O last 3 completed shifts: In: [id number] [P.O.:957] Out: 900 [Urine:900]   Intake/Output this shift:  No intake/output data recorded. Weight change:   Gen:NAD, conversive  CVS:RRR Resp: CTA anteriorly, shallow breaths Abd:+BS, soft, NT/ND  Ext: some dep edema  Recent Labs  Lab 06/07/21 0427 06/08/21 0432 06/09/21 0521 06/10/21 0625 06/11/21 0450 06/12/21 0726 06/13/21 0449  NA 137 138  137 138 140 137 138 135  K 4.2 3.9  3.9 4.2 4.4 4.5 4.2 4.4  CL 110 110  111 111 112* 113* 113* 110  CO2 18* 18*  18* 16* 18* 15* 21* 19*  GLUCOSE 83 77  77 71 72 74 81 81  BUN 102* 99*  100* 97* 96* 92* 95* 94*  CREATININE 8.99* 9.37*  9.40* 9.73* 9.94* 10.00* 10.21* 10.59*  ALBUMIN 2.0* 2.1* 2.1* 2.1* 2.1* 2.3*  --   CALCIUM 7.9* 8.1*  8.0* 8.2* 8.2* 7.7* 7.9* 7.8*  PHOS 7.5* 7.4* 7.3* 7.2* 7.3* 7.1*  --    Liver Function Tests: Recent Labs  Lab 06/10/21 0625 06/11/21 0450 06/12/21 0726  ALBUMIN 2.1* 2.1* 2.3*   No results for input(s): LIPASE, AMYLASE in the last 168 hours. No results for input(s): AMMONIA in the last 168 hours.  CBC: Recent Labs  Lab 06/08/21 0432 06/10/21 0625 06/12/21 0726 06/13/21 0449  WBC 10.2 9.4 9.7 8.5  HGB 8.5* 7.9* 7.9* 7.2*  HCT 26.9* 25.9* 24.1* 23.0*  MCV 91.2 93.2 92.3 93.9  PLT 112* 129* 158 154   Cardiac Enzymes: No results for input(s): CKTOTAL,  CKMB, CKMBINDEX, TROPONINI in the last 168 hours.  CBG: Recent Labs  Lab 06/08/21 1559  GLUCAP 90    Iron Studies:  Recent Labs    06/13/21 0449  IRON 42*  TIBC 188*  FERRITIN 456*   Studies/Results: No results found.  amiodarone  200 mg Oral QHS   amLODipine  5 mg Oral Daily   atorvastatin  10 mg Oral QHS   bisacodyl  10 mg Oral Daily   carbidopa-levodopa  1 tablet Oral BID   Chlorhexidine Gluconate Cloth  6 each Topical Daily   darbepoetin (ARANESP) injection - NON-DIALYSIS  200 mcg Subcutaneous Q Mon-1800   divalproex  375 mg Oral BID   feeding supplement (NEPRO CARB STEADY)  237 mL Oral BID   heparin  5,000 Units Subcutaneous Q8H   levothyroxine  112 mcg Oral Q0600   metoprolol tartrate  50 mg Oral BID   OLANZapine  15 mg Oral QHS   sodium bicarbonate  650 mg Oral TID   sodium chloride flush  3 mL Intravenous Q12H    BMET    Component Value Date/Time   NA 135 06/13/2021 0449   NA 145 11/28/2014 0455  K 4.4 06/13/2021 0449   K 3.6 11/28/2014 0455   CL 110 06/13/2021 0449   CL 117 (H) 11/28/2014 0455   CO2 19 (L) 06/13/2021 0449   CO2 26 11/28/2014 0455   GLUCOSE 81 06/13/2021 0449   GLUCOSE 81 11/28/2014 0455   BUN 94 (H) 06/13/2021 0449   BUN 17 11/28/2014 0455   CREATININE 10.59 (H) 06/13/2021 0449   CREATININE 1.17 11/28/2014 0455   CALCIUM 7.8 (L) 06/13/2021 0449   CALCIUM 7.9 (L) 11/28/2014 0455   GFRNONAA 5 (L) 06/13/2021 0449   GFRNONAA >60 11/28/2014 0455   GFRAA 39 (L) 08/16/2019 1327   GFRAA >60 11/28/2014 0455   CBC    Component Value Date/Time   WBC 8.5 06/13/2021 0449   RBC 2.45 (L) 06/13/2021 0449   HGB 7.2 (L) 06/13/2021 0449   HGB 9.0 (L) 11/30/2014 0912   HCT 23.0 (L) 06/13/2021 0449   HCT 27.2 (L) 11/29/2014 0438   PLT 154 06/13/2021 0449   PLT 139 (L) 11/29/2014 0438   MCV 93.9 06/13/2021 0449   MCV 92 11/29/2014 0438   MCH 29.4 06/13/2021 0449   MCHC 31.3 06/13/2021 0449   RDW 14.9 06/13/2021 0449   RDW 15.0 (H)  11/29/2014 0438   LYMPHSABS 2.5 06/03/2021 0509   LYMPHSABS 0.9 (L) 11/29/2014 0438   MONOABS 0.7 06/03/2021 0509   MONOABS 0.7 11/29/2014 0438   EOSABS 0.1 06/03/2021 0509   EOSABS 0.2 11/29/2014 0438   BASOSABS 0.0 06/03/2021 0509   BASOSABS 0.0 11/29/2014 0438    Assessment/Plan:  AKI/CKD stage III, non-oliguric - unclear etiology.   He was given CT with IV contrast on 03/22/21 and Scr was 1.9 at that time but never rechecked.  He has marked azotemia and CT scan without obstruction.  CK normal as well as C3 and C4.   Nonoliguric but BUN and Scr not improving-  rising so slowly from 9 to 10.6 in 6 days No urgent indication for dialysis at this time. Acute GN workup: negative ANCA, anti-GBM, normal complements but did have a markedly elevated ASO titer (although normal C3), and negative SPEP.  ANA negative.  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   Pt is not a suitable candidate for longterm dialysis given his poor functional, cognitive, and nutritional status.  Appreciate palliative care consult to help set goals/limits of care.  Since his family could not be reached SW/CM contacted DSS who will evaluate Mr. Wearing for guardianship (he is not competent to make decisions for himself). Hyperkalemia - resolved Parkinson's disease - wheelchair bound at baseline,  on sinemet Schizoaffective disorder - no documentation of lithium use. Anemia-  will giving ESA - iron stores low, will replete Chronic diastolic CHF - torsemide on hold-  on norvasc/metoprolol with still highish BPs-   no change for now.  PNA/UTI - no evidence of PNA on CXR. Metabolic acidosis-  on oral bicarb  From a kidney standpoint is difficult to say-  it will either continue to decline in which case life expectancy is days to weeks or it also may improve with time.  I would agree with changing goals of care to a more palliative approach- hospice and placement-  is  trying to be  done.  Renal has little more to offer at this time-  will  sign off but call with questions    [facility]

## 2021-06-13 NOTE — Progress Notes (Addendum)
  Ethics committee conference with Todd Page [facility]) , social worker Benay Pike and Ethics committee Physician Dr Alvira Monday via Jackquline Denmark on 06/12/21 - After chart review and conversations  that patient renal function  has declined and patient is not a candidate for hemodialysis as per nephrology team, given patient's comorbid conditions outlined in his medical record-patient will most likely succumb to his renal failure. -  Team agrees that patient should be DNI DNR with limitations to treatment, goal of cares should be focused on keeping patient comfortable and preserving patient's dignity - Patient will be discharged back to [facility] has been following patient -Sadly patient blood relatives are not involved in his care -Patient has cognitive challenges and does not have capacity to make informed decisions at this time - -[facility] SNF can attempt to get guardianship when patient returns to the facility  Roxan Hockey, MD

## 2021-06-13 NOTE — NC FL2 (Signed)
[place] LEVEL OF CARE SCREENING TOOL     IDENTIFICATION  Patient Name: Todd Page Birthdate: 11/18/1950 Sex: male Admission Date (Current Location): 06/02/2021  [place] and [place] Number:  [place] [id number] [place] and Address:  [place] [address], [place]      Provider Number: [id number]  Attending Physician Name and Address:  Roxan Hockey, MD  Relative Name and Phone Number:  Cardae, Sapp [facility])   [phone]    Current Level of Care: Hospital Recommended Level of Care: [place] Prior Approval Number:    Date Approved/Denied:   PASRR Number:    Discharge Plan: SNF    Current Diagnoses: Patient Active Problem List   Diagnosis Date Noted   Acute renal failure (ARF) ([place]) 06/02/2021   Bedbound 06/02/2021   Sepsis secondary to UTI ([place]) 05/12/2018   Hyperlipidemia 02/06/2018   Hypertensive urgency 02/05/2018   Parkinson's disease ([place]) 02/05/2018   Chronic diastolic CHF (congestive heart failure) ([place]) 02/05/2018   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[place]) 01/25/2018   Hyperglycemia 03/19/2014   CHF (congestive heart failure) ([place]) 03/19/2014   Acute exacerbation of congestive heart failure ([place]) 03/18/2014   Acute on chronic heart failure ([place]) 03/18/2014   Chest pain 06/08/2013   Tachypnea [id number]   Acute diastolic congestive heart failure ([place]) 05/25/2013   Chronic kidney disease, stage III (moderate) (HCC) - baseline SCr 1.9 05/25/2013   Acute gout 05/25/2013   Elevated troponin 05/18/2013   Acute respiratory failure ([place]) 02/20/2013   Lower extremity edema 02/17/2013   Acute on chronic systolic HF (heart failure) ([place]) 12/25/2012   Atrial fibrillation with rapid ventricular response ([place]) 12/24/2012   Malignant hypertension 12/24/2012   Dyspnea 12/24/2012   Bilateral leg pain 12/24/2012   Hypokalemia 12/24/2012   Noncompliance 09/21/2012   Hypertension 09/21/2012    Atrial fibrillation ([place]) 07/26/2012   Obesity 07/26/2012   Schizoaffective disorder ([place]) 07/26/2012    Orientation RESPIRATION BLADDER Height & Weight     Self, Place  Normal Indwelling catheter Weight: 214 lb 11.7 oz (97.4 kg) Height:  '5\' 11"'$  (180.3 cm)  BEHAVIORAL SYMPTOMS/MOOD NEUROLOGICAL BOWEL NUTRITION STATUS      Incontinent Diet (low sodium/heart healthy.)  AMBULATORY STATUS COMMUNICATION OF NEEDS Skin   Extensive Assist Verbally Normal                       Personal Care Assistance Level of Assistance  Bathing, Feeding, Dressing Bathing Assistance: Maximum assistance Feeding assistance: Limited assistance Dressing Assistance: Maximum assistance     Functional Limitations Info  Sight, Hearing, Speech Sight Info: Adequate Hearing Info: Adequate      SPECIAL CARE FACTORS FREQUENCY                       Contractures Contractures Info: Not present    Additional Factors Info  Code Status, Allergies Code Status Info: DNR Allergies Info: NKA           Current Medications (06/13/2021):  This is the current hospital active medication list Current Facility-Administered Medications  Medication Dose Route Frequency Provider Last Rate Last Admin   acetaminophen (TYLENOL) tablet 650 mg  650 mg Oral Q6H PRN Kristopher Oppenheim, DO       Or   acetaminophen (TYLENOL) suppository 650 mg  650 mg Rectal Q6H PRN Kristopher Oppenheim, DO       albuterol (PROVENTIL) (2.5 MG/3ML) 0.083% nebulizer solution 2.5 mg  2.5 mg Nebulization Q2H PRN Emokpae, Courage, MD       amiodarone (PACERONE) tablet 200 mg  200 mg Oral QHS Barton Dubois, MD   200 mg at 06/12/21 2116   amLODipine (NORVASC) tablet 5 mg  5 mg Oral Daily Emokpae, Courage, MD   5 mg at 06/13/21 0942   atorvastatin (LIPITOR) tablet 10 mg  10 mg Oral QHS Barton Dubois, MD   10 mg at 06/12/21 2116   bisacodyl (DULCOLAX) EC tablet 10 mg  10 mg Oral Daily Kristopher Oppenheim, DO   10 mg at 06/13/21 [id number]   carbidopa-levodopa (SINEMET CR)  50-200 MG per tablet controlled release 1 tablet  1 tablet Oral BID Kristopher Oppenheim, DO   1 tablet at 06/13/21 [id number]   Chlorhexidine Gluconate Cloth 2 % PADS 6 each  6 each Topical Daily Barton Dubois, MD   6 each at 06/13/21 1118   Darbepoetin Alfa (ARANESP) injection 200 mcg  200 mcg Subcutaneous Q Mon-1800 Corliss Parish, MD   200 mcg at 06/12/21 1825   divalproex (DEPAKOTE) DR tablet 375 mg  375 mg Oral BID Barton Dubois, MD   375 mg at 06/13/21 0954   feeding supplement (NEPRO CARB STEADY) liquid 237 mL  237 mL Oral BID Emokpae, Courage, MD   237 mL at 06/13/21 0942   ferric gluconate (FERRLECIT) 250 mg in sodium chloride 0.9 % 250 mL IVPB  250 mg Intravenous Daily Corliss Parish, MD 270 mL/hr at 06/13/21 1033 250 mg at 06/13/21 1033   heparin injection 5,000 Units  5,000 Units Subcutaneous Q8H Kristopher Oppenheim, DO   5,000 Units at 06/13/21 0533   ipratropium-albuterol (DUONEB) 0.5-2.5 (3) MG/3ML nebulizer solution 3 mL  3 mL Nebulization Q4H PRN Emokpae, Courage, MD       labetalol (NORMODYNE) injection 10 mg  10 mg Intravenous Q4H PRN [place] Brick, Courage, MD   10 mg at 06/11/21 1836   levothyroxine (SYNTHROID) tablet 112 mcg  112 mcg Oral Q0600 Kristopher Oppenheim, DO   112 mcg at 06/13/21 0533   metoprolol tartrate (LOPRESSOR) tablet 50 mg  50 mg Oral BID Kristopher Oppenheim, DO   50 mg at 06/13/21 0942   OLANZapine (ZYPREXA) tablet 15 mg  15 mg Oral QHS Barton Dubois, MD   15 mg at 06/12/21 2115   sodium bicarbonate tablet 650 mg  650 mg Oral TID Claudia Desanctis, MD   650 mg at 06/13/21 0942   sodium chloride flush (NS) 0.9 % injection 3 mL  3 mL Intravenous Q12H Kristopher Oppenheim, DO   3 mL at 06/13/21 [id number]     Discharge Medications: Please see discharge summary for a list of discharge medications.  Relevant Imaging Results:  Relevant Lab Results:   Additional Information    Treyshawn Muldrew, Clydene Pugh, LCSW

## 2021-06-13 NOTE — Progress Notes (Signed)
Nsg Discharge Note  Admit Date:  06/02/2021 Discharge date: 06/13/2021   [name] to be D/C'd Skilled nursing facility per MD order.  AVS completed.  Copy for chart, and copy for patient signed, and dated. Patient/caregiver able to verbalize understanding.  Discharge Medication: Allergies as of 06/13/2021   No Known Allergies      Medication List     STOP taking these medications    alum & mag hydroxide-simeth 200-200-20 MG/5ML suspension Commonly known as: MAALOX/MYLANTA   amoxicillin-clavulanate 875-125 MG tablet Commonly known as: AUGMENTIN   atorvastatin 10 MG tablet Commonly known as: LIPITOR   cefTRIAXone 1 g injection Commonly known as: ROCEPHIN   cloNIDine 0.2 MG tablet Commonly known as: CATAPRES   Enulose 10 GM/15ML Soln Generic drug: lactulose (encephalopathy)   ertapenem 1 g injection Commonly known as: INVANZ   FLUoxetine 20 MG capsule Commonly known as: PROZAC   guaiFENesin 600 MG 12 hr tablet Commonly known as: MUCINEX   Klor-Con M20 20 MEQ tablet Generic drug: potassium chloride SA   magnesium hydroxide 400 MG/5ML suspension Commonly known as: MILK OF MAGNESIA   potassium chloride 20 MEQ packet Commonly known as: KLOR-CON   PREDNISONE PO   senna 8.6 MG tablet Commonly known as: SENOKOT   simethicone 125 MG chewable tablet Commonly known as: MYLICON   sodium chloride 0.9 % infusion   torsemide 20 MG tablet Commonly known as: DEMADEX   ursodiol 300 MG capsule Commonly known as: ACTIGALL   Vitamin D3 125 MCG (5000 UT) Caps       TAKE these medications    acetaminophen 650 MG CR tablet Commonly known as: TYLENOL Take 650 mg by mouth every 8 (eight) hours as needed for pain.   albuterol 108 (90 Base) MCG/ACT inhaler Commonly known as: VENTOLIN HFA Inhale 1 puff into the lungs every 4 (four) hours as needed for shortness of breath.   amiodarone 200 MG tablet Commonly known as: PACERONE Take 1 tablet (200 mg total) by  mouth daily. What changed: Another medication with the same name was removed. Continue taking this medication, and follow the directions you see here.   amLODipine 5 MG tablet Commonly known as: NORVASC Take 1 tablet (5 mg total) by mouth every evening.   bisacodyl 5 MG EC tablet Commonly known as: DULCOLAX Take 10 mg by mouth every morning. What changed: Another medication with the same name was removed. Continue taking this medication, and follow the directions you see here.   calcium carbonate 500 MG chewable tablet Commonly known as: TUMS - dosed in mg elemental calcium Chew 1 tablet by mouth 3 (three) times daily.   carbidopa-levodopa 50-200 MG tablet Commonly known as: SINEMET CR Take 1 tablet by mouth 2 (two) times daily.   divalproex 125 MG DR tablet Commonly known as: DEPAKOTE Take 375 mg by mouth 2 (two) times daily.   divalproex 125 MG capsule Commonly known as: DEPAKOTE SPRINKLE Take 250 mg by mouth 2 (two) times daily.   escitalopram 10 MG tablet Commonly known as: LEXAPRO Take 10 mg by mouth daily. Give along with '5mg'$ ='15mg'$    feeding supplement (NEPRO CARB STEADY) Liqd Take 237 mLs by mouth 2 (two) times daily.   ferrous sulfate 325 (65 FE) MG EC tablet Take 1 tablet (325 mg total) by mouth 2 (two) times daily with a meal. What changed:  medication strength how much to take when to take this   ipratropium-albuterol 0.5-2.5 (3) MG/3ML Soln Commonly known as: DUONEB  Take 3 mLs by nebulization every 6 (six) hours as needed (for SOB; wheezing).   LACTULOSE PO Take 15 mLs by mouth daily.   levothyroxine 112 MCG tablet Commonly known as: SYNTHROID Take 112 mcg by mouth daily. What changed: Another medication with the same name was removed. Continue taking this medication, and follow the directions you see here.   lidocaine 1 % (with preservative) injection Commonly known as: XYLOCAINE SMARTSIG:Rectally   linaclotide 145 MCG Caps capsule Commonly known  as: LINZESS Take 145 mcg by mouth daily before breakfast. What changed: Another medication with the same name was removed. Continue taking this medication, and follow the directions you see here.   LORazepam 0.5 MG tablet Commonly known as: ATIVAN Take 1 tablet (0.5 mg total) by mouth 2 (two) times daily as needed for anxiety or sleep. What changed: reasons to take this   metoprolol tartrate 50 MG tablet Commonly known as: LOPRESSOR Take 50 mg by mouth 2 (two) times daily.   OLANZapine 15 MG tablet Commonly known as: ZYPREXA Take 15 mg by mouth at bedtime.   ondansetron 4 MG tablet Commonly known as: ZOFRAN Take 4 mg by mouth every 6 (six) hours as needed for nausea or vomiting.   senna-docusate 8.6-50 MG tablet Commonly known as: Senokot-S Take 2 tablets by mouth at bedtime.   sodium bicarbonate 650 MG tablet Take 1 tablet (650 mg total) by mouth 3 (three) times daily.        Discharge Assessment: Vitals:   06/13/21 1244 06/13/21 1304  BP: (!) 168/103 (!) 147/91  Pulse: 92 90  Resp: 18   Temp: 98.5 F (36.9 C)   SpO2: 99%    Skin clean, dry and intact without evidence of skin break down, no evidence of skin tears noted. IV catheter discontinued intact. Site without signs and symptoms of complications - no redness or edema noted at insertion site, patient denies c/o pain - only slight tenderness at site.  Dressing with slight pressure applied.  D/c Instructions-Education: Discharge instructions given to patient/family with verbalized understanding. D/c education completed with patient/family including follow up instructions, medication list, d/c activities limitations if indicated, with other d/c instructions as indicated by MD - patient able to verbalize understanding, all questions fully answered. Patient instructed to return to ED, call 911, or call MD for any changes in condition.  Patient escorted via [facility], and D/C home via private auto.  Dorcas Mcmurray,  LPN [phone] PM

## 2021-06-13 NOTE — TOC Transition Note (Addendum)
Transition of Care [facility]) - CM/SW Discharge Note   Patient Details  Name: KRISTOFFER SKOLD MRN: [id number] Date of Birth: 16-Feb-1951  [facility] CM/SW Contact:  Ihor Gully, LCSW Phone Number: 06/13/2021, 2:09 PM   Clinical Narrative:    Discharge clinicals sent to facility. Halie at [facility] notified of d/c. Transport arranged with [facility] transport.     Final next level of care: [facility] Barriers to Discharge: Continued Medical Work up   Patient Goals and CMS Choice Patient states their goals for this hospitalization and ongoing recovery are:: Return to LTC CMS Medicare.gov Compare Post Acute Care list provided to:: Patient Choice offered to / list presented to : Patient  Discharge Placement                       Discharge Plan and Services                                     Social Determinants of Health (SDOH) Interventions     Readmission Risk Interventions Readmission Risk Prevention Plan 06/05/2021  Transportation Screening Complete  Medication Review [employer]) Complete  PCP or Specialist appointment within 3-5 days of discharge Complete  HRI or Home Care Consult Complete  SW Recovery Care/Counseling Consult Complete  Palliative Care Screening Complete  Skilled Nursing Facility Complete  Some recent data might be hidden

## 2021-06-13 NOTE — Progress Notes (Signed)
Discharged to [facility] skilled nursing facility,report called and given to [facility] LPN.Transported by [facility] to awaiting facility. VSS. Discharged with foley catheter per Dr Joesph Fillers orders.Marland Kitchen

## 2021-06-13 NOTE — Discharge Summary (Addendum)
Todd Page, is a 70 y.o. male  DOB 1951-04-11  MRN [id number].  Admission date:  06/02/2021  Admitting Physician  Kristopher Oppenheim, DO  Discharge Date:  06/13/2021   Primary MD  Hilbert Corrigan, MD  Recommendations for primary care physician for things to follow:   1) avoid excessive investigations/tests-----Focus should be preserving patient's dignity and comfort 2) please get hospice consult when patient returns to facility 3)1) avoid excessive investigations/tests------ Care Focus should be patient's comfort and patient dignity 2)Ethics committee conference with Mrs Bennie Pierini [facility]) , social worker Benay Pike and Ethics committee Physician Dr Alvira Monday via Jackquline Denmark on 06/12/21 - After chart review and conversations  that patient renal function  has declined and patient is Not a candidate for hemodialysis as per nephrology team, given patient's comorbid conditions outlined in his medical record-patient will most likely succumb to his renal failure. -  The Treatment Team agrees that patient should be DNI DNR with limitations to treatment, goal of cares should be focused on keeping patient comfortable and preserving patient's dignity - Patient will be discharged back to [facility] has been following patient -Sadly patient blood relatives are Not involved in his care -Patient has cognitive challenges and does not have capacity to make informed decisions at this time - -[facility] SNF can attempt to get guardianship when patient returns to the facility   Admission Diagnosis  Cough [id number] Metabolic encephalopathy 99991111 Acute renal failure (ARF) ([place]) [N17.9] AKI (acute kidney injury) ([place]) [N17.9]   Discharge Diagnosis  Cough [id number] Metabolic encephalopathy 99991111 Acute renal failure (ARF) ([place]) [N17.9] AKI (acute kidney injury) ([place]) [N17.9]    Principal Problem:   Acute  renal failure (ARF) ([place]) Active Problems:   Atrial fibrillation ([place])   Schizoaffective disorder ([place])   Chronic kidney disease, stage III (moderate) ([place]) - baseline SCr 1.9   Parkinson's disease ([place])   Chronic diastolic CHF (congestive heart failure) ([place])   Bedbound      Past Medical History:  Diagnosis Date   A-fib ([place])    Anemia    Atrial fibrillation ([place]) 06/2012   onset 06/2012; normal TSH; EF-45%   CHF (congestive heart failure) ([place])    Cholelithiasis    incidental finding on CT in 2013   Cognitive communication deficit    Dysphagia    Dysphagia    Feeding difficulties    Gout    Hyperlipidemia    Hypertension    minimal coronary atherosclerosis by CT   Hypokalemia    Liver disease    Noncompliance 09/21/2012   Osteoporosis    Overweight(278.02)    Parkinson's disease ([place])    Peripheral vascular disease ([place])    Renal disorder    Schizoaffective disorder ([place])    With depression   Systolic dysfunction    Thoracic aortic aneurysm ([place])    Thrombophilia ([place])    Tinea cruris    Vascular dementia ([place])    Vitamin D deficiency     Past Surgical History:  Procedure Laterality Date   COLONOSCOPY  2006   normal screening examination   prosthetic heart valve       HPI  from the history and physical done on the day of admission:    CC: renal failure HPI: 70 year old male with a history of Parkinson's disease, paroxysmal atrial fibrillation, CKD stage III with a baseline creatinine of 1.9, schizoaffective disorder, paranoid schizophrenia, vascular dementia, bipolar disorder, chronic diastolic heart failure who presents to the ER for acute renal failure.  According to the documentation sent from the nursing home, the patient was started on Invanz via midline on 05/31/2021 for diagnosis of pneumonia.  There is no chest x-ray report that was sent.  He also had a Foley catheter placed on 06/01/2021.  Labs were drawn today at the nursing home and the patient was noted  to have a BUN of 115 with a creatinine of 7.5.  Potassium was elevated 6.0.  Bicarbonate was 20.  He was sent to the ER for further evaluation.   On repeat testing here, patient noted to have a potassium of 5.6, BUN of 117, creatinine of 8.5.   Urinalysis demonstrated large cassette esterase with negative nitrites.  Urine specific gravity 1.008.  White count elevated at 12.3.   ED providers discussed the case with nephrology with Dr.   Marval Regal.  Nephrology wants the patient admitted to [facility] hospitalist contacted for admission.   Patient states that he actually feels okay.  Denies any fever.  No nausea or vomiting.  Patient does not have any family or local contacts.  Patient wants to be a full code.   Patient thinks that he has lived at [facility] for the last 3 years.  He has not walked in several years.  Patient is bedbound.    ED Course: pt states on IVF. Given IV bicarb, po [facility] Course:       Assessment & Plan: 1-acute on chronic renal failure (ARF)  -Stage IIIb at baseline -He was given CT with IV contrast on 03/22/21 and Scr was 1.9 at that time but never rechecked.  He has marked azotemia and CT scan without obstruction.  CK normal as well as C3 and C4.   --Nonoliguric but BUN and Scr not improving (BUN > 90 and Cr > 10) -Acute GN workup: negative ANCA, anti-GBM, normal complements but did have a markedly elevated ASO titer (although normal C3), and negative SPEP.  ANA negative.  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  -Per Nephrology Team --Pt is Not a suitable candidate for longterm dialysis given his poor functional, cognitive, and nutritional status.  -Nephrology consult and input appreciated Patient with Foley catheter in place (placed at skilled nursing facility prior to admission);  -creatinine continues to trend up, overall prognosis is not encouraging Lab Results  Component Value Date   CREATININE 10.59 (H) 06/13/2021    CREATININE 10.21 (H) 06/12/2021   CREATININE 10.00 (H) 06/11/2021     2-Atrial fibrillation ([place]) -Patient was not on anticoagulation PTA  -Continue amiodarone and metoprolol for rate control   3-Schizoaffective disorder (HCC) -Mood overall stable -Continue supportive care, c/n  reorientation and nightly olanzapine.   4-Parkinson's disease ([place]) -Continue Sinemet. -Patient wheelchair-bound at baseline.   5-Chronic diastolic CHF (congestive heart failure) (HCC) -Stable and compensated currently -continue Holding diuretics in the setting of worsening renal function at this time. -Continue metoprolol 50 twice daily   6-essential hypertension -c/n amlodipine 5 mg  daily and metoprolol 50 twice daily   7)Social/Ethics--- DSS visit from [facility] appreciated -Palliative care consult appreciated --Ethics committee input appreciated please see separate note -Patient remains full code -DSS plans to apply for long-term guardianship (this may take a while) - Capacity eval by me on 06/07/21- Patient appears to have cognitive deficit r/t multiple chronic illnesses including kidney disease, schizoaffective disorder, Parkinson's and presumed vascular dementia--- [name] is  a  70 y.o.  who is AAO x 3 who has a history of cognitive deficits/decline in the setting of vascular dementia, Parkinson's disease and multiple chronic comorbidities, who also has a past medical history of depression and schizoaffective disorder who is  Not able to fully understand (without significant language barrier) his current medical diagnosis, patient does Not fully understand proposed treatment options including option of no treatment, the patient does Not appear to fully understand consequences/risk Versus benefit of each treatment option, alternatives as well as the option of no treatment.  -Based on my evaluation SKLYER BAGE  appears to  Not have the Capacity to make decisions and give informed consent about his  medical care.  -MMSE performed on 06/08/21 and he scored a 22 out of 30 indicating mild dementia -Ability to make decisions/capacity may vary from time to time as cognition varies A surrogate decision-maker is required as [name]  as it  appears that he does Not have capacity to make his own decisions and or give informed consent regarding his medical care at this time - ---avoid excessive investigations/tests-----Focus should be preserving patient's dignity and comfort --please get hospice consult when patient returns to facility --avoid excessive investigations/tests------ Care Focus should be patient's comfort and patient dignity --Ethics committee conference with Mrs Bennie Pierini [facility]) , social worker Benay Pike and Ethics committee Physician Dr Alvira Monday via Jackquline Denmark on 06/12/21 - After chart review and conversations  that patient renal function  has declined and patient is Not a candidate for hemodialysis as per nephrology team, given patient's comorbid conditions outlined in his medical record-patient will most likely succumb to his renal failure. -  The Treatment Team agrees that patient should be DNI DNR with limitations to treatment, goal of cares should be focused on keeping patient comfortable and preserving patient's dignity - Patient will be discharged back to [facility] has been following patient -Sadly patient blood relatives are Not involved in his care -Patient has cognitive challenges and does not have capacity to make informed decisions at this time - -[facility] SNF can attempt to get guardianship when patient returns to the facility   8) risk for hypoglycemia--- episodes of borderline low sugars noted, give nutritional supplements  - 9) hypothyroidism--- continue levothyroxine 112 mcg daily  10) multifactorial anemia--CKD contributory, the patient also has iron deficiency -Received Aranesp and iron infusion -Okay to discharge on iron Hemoglobin &  Hematocrit     Component Value Date/Time   HGB 7.2 (L) 06/13/2021 0449   HGB 9.0 (L) 11/30/2014 0912   HCT 23.0 (L) 06/13/2021 0449   HCT 27.2 (L) 11/29/2014 [id number]     Code Status: DNR Family Communication: Communications with DSS and [facility] staff , blood relatives not involved in his care  disposition:    Dispo: The patient is from: SNF              Anticipated d/c is to: SNF [facility] with Hospice Services  Consultants:  Nephrology service Palliative care  Ethics committee 06/12/21   Discharge Condition: stable  Follow UP--discharge on hospice team at facility  Diet and Activity recommendation:  As advised  Discharge Instructions    Discharge Instructions     Call MD for:  difficulty breathing, headache or visual disturbances   Complete by: As directed    Call MD for:  persistant dizziness or light-headedness   Complete by: As directed    Call MD for:  persistant nausea and vomiting   Complete by: As directed    Call MD for:  severe uncontrolled pain   Complete by: As directed    Call MD for:  temperature >100.4   Complete by: As directed    Diet - low sodium heart healthy   Complete by: As directed    Discharge instructions   Complete by: As directed    1) avoid excessive investigations/tests-----Focus should be preserving patient's dignity and comfort 2) please get hospice consult when patient returns to facility 3)1) avoid excessive investigations/tests------ Care Focus should be patient's comfort and patient dignity 2)Ethics committee conference with Mrs Bennie Pierini [facility]) , social worker Benay Pike and Ethics committee Physician Dr Alvira Monday via Jackquline [place] on 06/12/21 - After chart review and conversations  that patient renal function  has declined and patient is Not a candidate for hemodialysis as per nephrology team, given patient's comorbid conditions outlined in his medical record-patient will most likely succumb to his renal  failure. -  The Treatment Team agrees that patient should be DNI DNR with limitations to treatment, goal of cares should be focused on keeping patient comfortable and preserving patient's dignity - Patient will be discharged back to [facility] has been following patient -Sadly patient blood relatives are Not involved in his care -Patient has cognitive challenges and does not have capacity to make informed decisions at this time - -[facility] SNF can attempt to get guardianship when patient returns to the facility   Increase activity slowly   Complete by: As directed        Discharge Medications     Allergies as of 06/13/2021   No Known Allergies      Medication List     STOP taking these medications    alum & mag hydroxide-simeth 200-200-20 MG/5ML suspension Commonly known as: MAALOX/MYLANTA   amoxicillin-clavulanate 875-125 MG tablet Commonly known as: AUGMENTIN   atorvastatin 10 MG tablet Commonly known as: LIPITOR   cefTRIAXone 1 g injection Commonly known as: ROCEPHIN   cloNIDine 0.2 MG tablet Commonly known as: CATAPRES   Enulose 10 GM/15ML Soln Generic drug: lactulose (encephalopathy)   ertapenem 1 g injection Commonly known as: INVANZ   FLUoxetine 20 MG capsule Commonly known as: PROZAC   guaiFENesin 600 MG 12 hr tablet Commonly known as: MUCINEX   Klor-Con M20 20 MEQ tablet Generic drug: potassium chloride SA   magnesium hydroxide 400 MG/5ML suspension Commonly known as: MILK OF MAGNESIA   potassium chloride 20 MEQ packet Commonly known as: KLOR-CON   PREDNISONE PO   senna 8.6 MG tablet Commonly known as: SENOKOT   simethicone 125 MG chewable tablet Commonly known as: MYLICON   sodium chloride 0.9 % infusion   torsemide 20 MG tablet Commonly known as: DEMADEX   ursodiol 300 MG capsule Commonly known as: ACTIGALL   Vitamin D3 125 MCG (5000 UT) Caps       TAKE these medications    acetaminophen 650 MG CR  tablet  Commonly known as: TYLENOL Take 650 mg by mouth every 8 (eight) hours as needed for pain.   albuterol 108 (90 Base) MCG/ACT inhaler Commonly known as: VENTOLIN HFA Inhale 1 puff into the lungs every 4 (four) hours as needed for shortness of breath.   amiodarone 200 MG tablet Commonly known as: PACERONE Take 1 tablet (200 mg total) by mouth daily. What changed: Another medication with the same name was removed. Continue taking this medication, and follow the directions you see here.   amLODipine 5 MG tablet Commonly known as: NORVASC Take 1 tablet (5 mg total) by mouth every evening.   bisacodyl 5 MG EC tablet Commonly known as: DULCOLAX Take 10 mg by mouth every morning. What changed: Another medication with the same name was removed. Continue taking this medication, and follow the directions you see here.   calcium carbonate 500 MG chewable tablet Commonly known as: TUMS - dosed in mg elemental calcium Chew 1 tablet by mouth 3 (three) times daily.   carbidopa-levodopa 50-200 MG tablet Commonly known as: SINEMET CR Take 1 tablet by mouth 2 (two) times daily.   divalproex 125 MG DR tablet Commonly known as: DEPAKOTE Take 375 mg by mouth 2 (two) times daily.   divalproex 125 MG capsule Commonly known as: DEPAKOTE SPRINKLE Take 250 mg by mouth 2 (two) times daily.   escitalopram 10 MG tablet Commonly known as: LEXAPRO Take 10 mg by mouth daily. Give along with '5mg'$ ='15mg'$    feeding supplement (NEPRO CARB STEADY) Liqd Take 237 mLs by mouth 2 (two) times daily.   ferrous sulfate 325 (65 FE) MG EC tablet Take 1 tablet (325 mg total) by mouth 2 (two) times daily with a meal. What changed:  medication strength how much to take when to take this   ipratropium-albuterol 0.5-2.5 (3) MG/3ML Soln Commonly known as: DUONEB Take 3 mLs by nebulization every 6 (six) hours as needed (for SOB; wheezing).   LACTULOSE PO Take 15 mLs by mouth daily.   levothyroxine 112 MCG  tablet Commonly known as: SYNTHROID Take 112 mcg by mouth daily. What changed: Another medication with the same name was removed. Continue taking this medication, and follow the directions you see here.   lidocaine 1 % (with preservative) injection Commonly known as: XYLOCAINE SMARTSIG:Rectally   linaclotide 145 MCG Caps capsule Commonly known as: LINZESS Take 145 mcg by mouth daily before breakfast. What changed: Another medication with the same name was removed. Continue taking this medication, and follow the directions you see here.   LORazepam 0.5 MG tablet Commonly known as: ATIVAN Take 1 tablet (0.5 mg total) by mouth 2 (two) times daily as needed for anxiety or sleep. What changed: reasons to take this   metoprolol tartrate 50 MG tablet Commonly known as: LOPRESSOR Take 50 mg by mouth 2 (two) times daily.   OLANZapine 15 MG tablet Commonly known as: ZYPREXA Take 15 mg by mouth at bedtime.   ondansetron 4 MG tablet Commonly known as: ZOFRAN Take 4 mg by mouth every 6 (six) hours as needed for nausea or vomiting.   senna-docusate 8.6-50 MG tablet Commonly known as: Senokot-S Take 2 tablets by mouth at bedtime.   sodium bicarbonate 650 MG tablet Take 1 tablet (650 mg total) by mouth 3 (three) times daily.        Major procedures and Radiology Reports - PLEASE review detailed and final reports for all details, in brief -   CT Head Wo Contrast  Result Date: 06/02/2021  CLINICAL DATA:  Mental status change, unknown cause. EXAM: CT HEAD WITHOUT CONTRAST TECHNIQUE: Contiguous axial images were obtained from the base of the skull through the vertex without intravenous contrast. COMPARISON:  CT September 11, 2016 FINDINGS: Brain: Similar age related global parenchymal volume loss with ex vacuo dilatation of ventricular system. Mild burden of subcortical and periventricular white matter hypodensities, while nonspecific favored to represent chronic ischemic microvascular white  matter disease. No evidence of acute large vascular territory infarction, hemorrhage, hydrocephalus, extra-axial collection or mass lesion/mass effect. Vascular: No hyperdense vessel. Atherosclerotic calcifications of the internal carotid and vertebral arteries at the skull base. Skull: Normal. Negative for fracture or focal lesion. Sinuses/Orbits: Visualized portions of the paranasal sinuses and mastoid air cells are predominantly clear. Orbits are unremarkable. Other: None IMPRESSION: 1. No acute intracranial findings. 2. Age related global parenchymal volume loss and chronic ischemic microvascular white matter disease. Electronically Signed   By: Dahlia Bailiff MD   On: 06/02/2021 18:12   US Renal  Result Date: 06/03/2021 CLINICAL DATA:  70 year old male with history of acute kidney injury. EXAM: RENAL / URINARY TRACT ULTRASOUND COMPLETE COMPARISON:  No priors. FINDINGS: Right Kidney: Renal measurements: 9.1 x 4.6 x 5.0 cm = volume: 110 mL. Diffusely increased echogenicity. No mass or hydronephrosis visualized. Left Kidney: Renal measurements: 10.7 x 6.3 x 5.8 cm = volume: 206 mL. Diffusely increased echogenicity. No mass or hydronephrosis visualized. Bladder: Appears normal for degree of bladder distention. Other: None. IMPRESSION: 1. Atrophic echogenic kidneys bilaterally (right greater than left), indicative of medical renal disease. Electronically Signed   By: Vinnie Langton M.D.   On: 06/03/2021 10:27   DG Chest Port 1 View  Result Date: 06/02/2021 CLINICAL DATA:  Cough EXAM: PORTABLE CHEST 1 VIEW COMPARISON:  05/12/2018 FINDINGS: Low lung volumes with elevated right diaphragm. Cardiomegaly with central bronchovascular crowding. No consolidation, pleural effusion or pneumothorax. Aortic atherosclerosis. IMPRESSION: No active disease.  Cardiomegaly with low lung volumes. Electronically Signed   By: Donavan Foil M.D.   On: 06/02/2021 17:12   CT Renal Stone Study  Result Date: 06/02/2021 CLINICAL  DATA:  Hematuria and renal failure EXAM: CT ABDOMEN AND PELVIS WITHOUT CONTRAST TECHNIQUE: Multidetector CT imaging of the abdomen and pelvis was performed following the standard protocol without IV contrast. COMPARISON:  11/11/2019 FINDINGS: Lower chest: Mild atelectatic changes are noted in the bases bilaterally. No focal confluent infiltrate is seen. Coronary calcifications are seen as well as a small pericardial effusion stable from the prior study. Hepatobiliary: Liver is well visualized and within normal limits. Gallbladder is partially distended with multiple dependent gallstones similar to that seen on prior CT examination. One of these appears in the region of the gallbladder neck. No inflammatory changes are seen. Pancreas: Unremarkable. No pancreatic ductal dilatation or surrounding inflammatory changes. Spleen: Normal in size without focal abnormality. Adrenals/Urinary Tract: Adrenal glands are within normal limits. Kidneys demonstrate tiny nonobstructing right renal calculi. Some scarring is noted in the left kidney new from the prior exam. No focal mass lesion or hydronephrosis is seen. The bladder is decompressed by Foley catheter. Stomach/Bowel: Colon shows no obstructive or inflammatory changes. The appendix is well visualized and within normal limits. Small bowel and stomach are within normal limits. Vascular/Lymphatic: Atherosclerotic calcifications of the abdominal aorta are noted without aneurysmal dilatation. No significant lymphadenopathy is noted. Reproductive: Prostate is unremarkable. Other: No abdominal wall hernia or abnormality. No abdominopelvic ascites. Musculoskeletal: Degenerative changes of lumbar spine are noted. IMPRESSION: Cholelithiasis. One of the  stones is near the gallbladder neck although no complicating factors are seen. Nonobstructing right renal stones Aortic Atherosclerosis (ICD10-I70.0). Electronically Signed   By: Inez Catalina M.D.   On: 06/02/2021 20:03    Micro  Results   No results found for this or any previous visit (from the past 240 hour(s)).  Today   Subjective    Lynne Logan today has no new complaints -Oral intake is fair -No emesis          Patient has been seen and examined prior to discharge   Objective   Blood pressure (!) 147/91, pulse 90, temperature 98.5 F (36.9 C), temperature source Oral, resp. rate 18, height '5\' 11"'$  (1.803 m), weight 97.4 kg, SpO2 99 %.   Intake/Output Summary (Last 24 hours) at 06/13/2021 1325 Last data filed at 06/13/2021 1302 Gross per 24 hour  Intake 510 ml  Output --  Net 510 ml    Exam General exam:  awake , in no apparent distress  Respiratory system: no wheezing, no crackles. Good air movement bilaterally Cardiovascular system:S1, S2 , RRR.  Gastrointestinal system: Abdomen is nondistended, soft and nontender. . Normal bowel sounds heard. Extremities: Good pedal pulses Skin: Warm and dry  NeuroPsychiatry: Wheelchair-bound at baseline due to Parkinson's, no new additional focal neuro deficits, baseline cognitive and memory deficits noted GU--scrotal swelling noted, Foley in situ   Data Review   CBC w Diff:  Lab Results  Component Value Date   WBC 8.5 06/13/2021   HGB 7.2 (L) 06/13/2021   HGB 9.0 (L) 11/30/2014   HCT 23.0 (L) 06/13/2021   HCT 27.2 (L) 11/29/2014   PLT 154 06/13/2021   PLT 139 (L) 11/29/2014   LYMPHOPCT 19 06/03/2021   LYMPHOPCT 12.0 11/29/2014   BANDSPCT 3 06/03/2021   MONOPCT 5 06/03/2021   MONOPCT 10.0 11/29/2014   EOSPCT 1 06/03/2021   EOSPCT 2.7 11/29/2014   BASOPCT 0 06/03/2021   BASOPCT 0.4 11/29/2014    CMP:  Lab Results  Component Value Date   NA 135 06/13/2021   NA 145 11/28/2014   K 4.4 06/13/2021   K 3.6 11/28/2014   CL 110 06/13/2021   CL 117 (H) 11/28/2014   CO2 19 (L) 06/13/2021   CO2 26 11/28/2014   BUN 94 (H) 06/13/2021   BUN 17 11/28/2014   CREATININE 10.59 (H) 06/13/2021   CREATININE 1.17 11/28/2014   PROT 5.6 (L) 06/03/2021    PROT 6.4 10/29/2014   ALBUMIN 2.3 (L) 06/12/2021   ALBUMIN 3.4 10/29/2014   BILITOT 0.6 06/03/2021   BILITOT 1.0 10/29/2014   ALKPHOS 48 06/03/2021   ALKPHOS 68 10/29/2014   AST 8 (L) 06/03/2021   AST 29 10/29/2014   ALT <5 06/03/2021   ALT 33 10/29/2014  .   Total Discharge time is about 33 minutes  Roxan Hockey M.D on 06/13/2021 at 1:25 PM  Go to [email or web address] -  for contact info  [name] - Office  [phone]

## 2021-06-29 DEATH — deceased

## 2023-03-12 ENCOUNTER — Other Ambulatory Visit: Payer: Self-pay | Admitting: Surgery

## 2023-03-12 DIAGNOSIS — I7121 Aneurysm of the ascending aorta, without rupture: Secondary | ICD-10-CM

## 2023-04-10 ENCOUNTER — Ambulatory Visit: Payer: Medicare Other | Admitting: Surgery

## 2023-04-10 ENCOUNTER — Other Ambulatory Visit: Payer: Medicare Other
